# Patient Record
Sex: Female | Born: 1935 | Race: White | Hispanic: No | Smoking: Former smoker
Health system: Southern US, Community
[De-identification: ages and names within clinical notes are randomized; demographics above are authoritative.]

## PROBLEM LIST (undated history)

## (undated) DIAGNOSIS — M545 Low back pain, unspecified: Secondary | ICD-10-CM

## (undated) DIAGNOSIS — G8929 Other chronic pain: Secondary | ICD-10-CM

## (undated) DIAGNOSIS — F0390 Unspecified dementia without behavioral disturbance: Secondary | ICD-10-CM

## (undated) DIAGNOSIS — F419 Anxiety disorder, unspecified: Secondary | ICD-10-CM

## (undated) DIAGNOSIS — J189 Pneumonia, unspecified organism: Secondary | ICD-10-CM

## (undated) DIAGNOSIS — A498 Other bacterial infections of unspecified site: Secondary | ICD-10-CM

## (undated) DIAGNOSIS — E7800 Pure hypercholesterolemia, unspecified: Secondary | ICD-10-CM

## (undated) DIAGNOSIS — I10 Essential (primary) hypertension: Secondary | ICD-10-CM

## (undated) DIAGNOSIS — I2510 Atherosclerotic heart disease of native coronary artery without angina pectoris: Secondary | ICD-10-CM

## (undated) DIAGNOSIS — K5730 Diverticulosis of large intestine without perforation or abscess without bleeding: Secondary | ICD-10-CM

## (undated) DIAGNOSIS — E119 Type 2 diabetes mellitus without complications: Secondary | ICD-10-CM

## (undated) DIAGNOSIS — C50919 Malignant neoplasm of unspecified site of unspecified female breast: Secondary | ICD-10-CM

## (undated) DIAGNOSIS — B351 Tinea unguium: Secondary | ICD-10-CM

## (undated) DIAGNOSIS — M1990 Unspecified osteoarthritis, unspecified site: Secondary | ICD-10-CM

## (undated) DIAGNOSIS — K635 Polyp of colon: Secondary | ICD-10-CM

## (undated) DIAGNOSIS — I872 Venous insufficiency (chronic) (peripheral): Secondary | ICD-10-CM

## (undated) HISTORY — DX: Anxiety disorder, unspecified: F41.9

## (undated) HISTORY — PX: APPENDECTOMY: SHX54

## (undated) HISTORY — DX: Polyp of colon: K63.5

## (undated) HISTORY — DX: Other bacterial infections of unspecified site: A49.8

## (undated) HISTORY — DX: Essential (primary) hypertension: I10

## (undated) HISTORY — PX: CYST EXCISION: SHX5701

## (undated) HISTORY — DX: Malignant neoplasm of unspecified site of unspecified female breast: C50.919

## (undated) HISTORY — DX: Pure hypercholesterolemia, unspecified: E78.00

## (undated) HISTORY — PX: TONSILLECTOMY: SUR1361

## (undated) HISTORY — DX: Venous insufficiency (chronic) (peripheral): I87.2

## (undated) HISTORY — DX: Atherosclerotic heart disease of native coronary artery without angina pectoris: I25.10

## (undated) HISTORY — DX: Unspecified osteoarthritis, unspecified site: M19.90

## (undated) HISTORY — DX: Tinea unguium: B35.1

## (undated) HISTORY — PX: CATARACT EXTRACTION W/ INTRAOCULAR LENS  IMPLANT, BILATERAL: SHX1307

## (undated) HISTORY — DX: Diverticulosis of large intestine without perforation or abscess without bleeding: K57.30

## (undated) HISTORY — DX: Unspecified dementia, unspecified severity, without behavioral disturbance, psychotic disturbance, mood disturbance, and anxiety: F03.90

---

## 2002-03-11 ENCOUNTER — Ambulatory Visit (HOSPITAL_COMMUNITY): Admission: RE | Admit: 2002-03-11 | Discharge: 2002-03-11 | Payer: Self-pay | Admitting: Pulmonary Disease

## 2002-03-11 ENCOUNTER — Encounter: Payer: Self-pay | Admitting: Pulmonary Disease

## 2003-06-26 DIAGNOSIS — K635 Polyp of colon: Secondary | ICD-10-CM

## 2003-06-26 HISTORY — DX: Polyp of colon: K63.5

## 2003-08-08 DIAGNOSIS — C50919 Malignant neoplasm of unspecified site of unspecified female breast: Secondary | ICD-10-CM

## 2003-08-08 HISTORY — PX: BREAST BIOPSY: SHX20

## 2003-08-08 HISTORY — DX: Malignant neoplasm of unspecified site of unspecified female breast: C50.919

## 2004-05-26 ENCOUNTER — Other Ambulatory Visit: Admission: RE | Admit: 2004-05-26 | Discharge: 2004-05-26 | Payer: Self-pay | Admitting: Radiology

## 2004-05-26 ENCOUNTER — Encounter (INDEPENDENT_AMBULATORY_CARE_PROVIDER_SITE_OTHER): Payer: Self-pay | Admitting: Radiology

## 2004-06-01 ENCOUNTER — Encounter (HOSPITAL_COMMUNITY): Admission: RE | Admit: 2004-06-01 | Discharge: 2004-08-30 | Payer: Self-pay | Admitting: General Surgery

## 2004-06-07 HISTORY — PX: MASTECTOMY, PARTIAL: SHX709

## 2004-06-09 ENCOUNTER — Encounter: Admission: RE | Admit: 2004-06-09 | Discharge: 2004-06-09 | Payer: Self-pay | Admitting: General Surgery

## 2004-06-10 ENCOUNTER — Encounter (INDEPENDENT_AMBULATORY_CARE_PROVIDER_SITE_OTHER): Payer: Self-pay | Admitting: *Deleted

## 2004-06-10 ENCOUNTER — Ambulatory Visit (HOSPITAL_COMMUNITY): Admission: RE | Admit: 2004-06-10 | Discharge: 2004-06-10 | Payer: Self-pay | Admitting: General Surgery

## 2004-06-10 ENCOUNTER — Ambulatory Visit (HOSPITAL_BASED_OUTPATIENT_CLINIC_OR_DEPARTMENT_OTHER): Admission: RE | Admit: 2004-06-10 | Discharge: 2004-06-10 | Payer: Self-pay | Admitting: General Surgery

## 2004-06-10 ENCOUNTER — Encounter: Admission: RE | Admit: 2004-06-10 | Discharge: 2004-06-10 | Payer: Self-pay | Admitting: General Surgery

## 2004-06-10 ENCOUNTER — Encounter (INDEPENDENT_AMBULATORY_CARE_PROVIDER_SITE_OTHER): Payer: Self-pay | Admitting: General Surgery

## 2004-06-23 ENCOUNTER — Ambulatory Visit: Payer: Self-pay | Admitting: Oncology

## 2004-07-06 ENCOUNTER — Ambulatory Visit: Admission: RE | Admit: 2004-07-06 | Discharge: 2004-08-12 | Payer: Self-pay | Admitting: Radiation Oncology

## 2004-07-13 ENCOUNTER — Ambulatory Visit: Payer: Self-pay | Admitting: Pulmonary Disease

## 2004-08-09 ENCOUNTER — Ambulatory Visit: Payer: Self-pay | Admitting: Oncology

## 2004-09-26 ENCOUNTER — Ambulatory Visit: Payer: Self-pay | Admitting: Pulmonary Disease

## 2004-09-27 ENCOUNTER — Ambulatory Visit: Payer: Self-pay | Admitting: Pulmonary Disease

## 2004-11-07 ENCOUNTER — Ambulatory Visit: Payer: Self-pay | Admitting: Oncology

## 2005-06-12 ENCOUNTER — Ambulatory Visit: Payer: Self-pay | Admitting: Oncology

## 2005-11-08 ENCOUNTER — Ambulatory Visit: Payer: Self-pay | Admitting: Pulmonary Disease

## 2005-11-09 ENCOUNTER — Ambulatory Visit: Payer: Self-pay | Admitting: Pulmonary Disease

## 2005-12-07 ENCOUNTER — Ambulatory Visit: Payer: Self-pay | Admitting: Oncology

## 2005-12-11 LAB — COMPREHENSIVE METABOLIC PANEL
AST: 16 U/L (ref 0–37)
Alkaline Phosphatase: 46 U/L (ref 39–117)
BUN: 44 mg/dL — ABNORMAL HIGH (ref 6–23)
Creatinine, Ser: 1.2 mg/dL (ref 0.4–1.2)
Total Bilirubin: 0.7 mg/dL (ref 0.3–1.2)

## 2005-12-11 LAB — CBC WITH DIFFERENTIAL/PLATELET
Basophils Absolute: 0 10*3/uL (ref 0.0–0.1)
EOS%: 3.4 % (ref 0.0–7.0)
HCT: 42.5 % (ref 34.8–46.6)
HGB: 14.5 g/dL (ref 11.6–15.9)
MCH: 29.8 pg (ref 26.0–34.0)
MCV: 87.6 fL (ref 81.0–101.0)
MONO%: 10 % (ref 0.0–13.0)
NEUT%: 66.3 % (ref 39.6–76.8)
RDW: 14.8 % — ABNORMAL HIGH (ref 11.3–14.5)

## 2006-06-27 ENCOUNTER — Encounter: Admission: RE | Admit: 2006-06-27 | Discharge: 2006-06-27 | Payer: Self-pay | Admitting: General Surgery

## 2006-07-12 ENCOUNTER — Ambulatory Visit: Payer: Self-pay | Admitting: Oncology

## 2006-07-16 LAB — CBC WITH DIFFERENTIAL/PLATELET
BASO%: 0.3 % (ref 0.0–2.0)
Basophils Absolute: 0 10*3/uL (ref 0.0–0.1)
EOS%: 3.1 % (ref 0.0–7.0)
MCH: 29.6 pg (ref 26.0–34.0)
MCHC: 33.4 g/dL (ref 32.0–36.0)
MCV: 88.6 fL (ref 81.0–101.0)
MONO%: 6.6 % (ref 0.0–13.0)
RBC: 4.7 10*6/uL (ref 3.70–5.32)
RDW: 14.5 % (ref 11.3–14.5)

## 2006-07-16 LAB — COMPREHENSIVE METABOLIC PANEL
AST: 17 U/L (ref 0–37)
Albumin: 4.4 g/dL (ref 3.5–5.2)
Alkaline Phosphatase: 53 U/L (ref 39–117)
BUN: 28 mg/dL — ABNORMAL HIGH (ref 6–23)
Potassium: 4.4 mEq/L (ref 3.5–5.3)

## 2006-12-19 ENCOUNTER — Ambulatory Visit: Payer: Self-pay | Admitting: Pulmonary Disease

## 2006-12-20 ENCOUNTER — Ambulatory Visit: Payer: Self-pay | Admitting: Pulmonary Disease

## 2006-12-20 LAB — CONVERTED CEMR LAB
ALT: 13 units/L (ref 0–40)
AST: 21 units/L (ref 0–37)
Albumin: 4 g/dL (ref 3.5–5.2)
Alkaline Phosphatase: 50 units/L (ref 39–117)
BUN: 41 mg/dL — ABNORMAL HIGH (ref 6–23)
Basophils Absolute: 0 10*3/uL (ref 0.0–0.1)
Basophils Relative: 0.5 % (ref 0.0–1.0)
Bilirubin, Direct: 0.3 mg/dL (ref 0.0–0.3)
CO2: 28 meq/L (ref 19–32)
Calcium: 9.7 mg/dL (ref 8.4–10.5)
Chloride: 102 meq/L (ref 96–112)
Cholesterol: 162 mg/dL (ref 0–200)
Creatinine, Ser: 1.1 mg/dL (ref 0.4–1.2)
Eosinophils Absolute: 0.4 10*3/uL (ref 0.0–0.6)
Eosinophils Relative: 4.9 % (ref 0.0–5.0)
GFR calc Af Amer: 60 mL/min
GFR calc non Af Amer: 50 mL/min
Glucose, Bld: 166 mg/dL — ABNORMAL HIGH (ref 70–99)
HCT: 43.4 % (ref 36.0–46.0)
HDL: 62.5 mg/dL (ref >39.0)
Hemoglobin: 14.9 g/dL (ref 12.0–15.0)
Hgb A1c MFr Bld: 6.5 % — ABNORMAL HIGH (ref 4.6–6.0)
LDL Cholesterol: 83 mg/dL (ref 0–99)
Lymphocytes Relative: 24.3 % (ref 12.0–46.0)
MCHC: 34.4 g/dL (ref 30.0–36.0)
MCV: 86.1 fL (ref 78.0–100.0)
Monocytes Absolute: 0.6 10*3/uL (ref 0.2–0.7)
Monocytes Relative: 8.1 % (ref 3.0–11.0)
Neutro Abs: 4.6 10*3/uL (ref 1.4–7.7)
Neutrophils Relative %: 62.2 % (ref 43.0–77.0)
Platelets: 192 10*3/uL (ref 150–400)
Potassium: 4.8 meq/L (ref 3.5–5.1)
RBC: 5.04 M/uL (ref 3.87–5.11)
RDW: 13.7 % (ref 11.5–14.6)
Sodium: 138 meq/L (ref 135–145)
TSH: 1.39 microintl units/mL (ref 0.35–5.50)
Total Bilirubin: 1.1 mg/dL (ref 0.3–1.2)
Total CHOL/HDL Ratio: 2.6
Total Protein: 6.9 g/dL (ref 6.0–8.3)
Triglycerides: 82 mg/dL (ref 0–149)
VLDL: 16 mg/dL (ref 0–40)
WBC: 7.4 10*3/uL (ref 4.5–10.5)

## 2007-02-15 ENCOUNTER — Ambulatory Visit: Payer: Self-pay | Admitting: Oncology

## 2007-02-19 LAB — CBC WITH DIFFERENTIAL/PLATELET
Basophils Absolute: 0.1 10*3/uL (ref 0.0–0.1)
EOS%: 3.9 % (ref 0.0–7.0)
HCT: 39.4 % (ref 34.8–46.6)
HGB: 13.6 g/dL (ref 11.6–15.9)
MCH: 30 pg (ref 26.0–34.0)
MCV: 86.8 fL (ref 81.0–101.0)
MONO%: 8.9 % (ref 0.0–13.0)
NEUT%: 67.8 % (ref 39.6–76.8)

## 2007-02-19 LAB — COMPREHENSIVE METABOLIC PANEL
AST: 16 U/L (ref 0–37)
BUN: 43 mg/dL — ABNORMAL HIGH (ref 6–23)
Calcium: 9.9 mg/dL (ref 8.4–10.5)
Chloride: 104 mEq/L (ref 96–112)
Creatinine, Ser: 1.34 mg/dL — ABNORMAL HIGH (ref 0.40–1.20)
Glucose, Bld: 122 mg/dL — ABNORMAL HIGH (ref 70–99)

## 2007-05-23 ENCOUNTER — Encounter: Payer: Self-pay | Admitting: Pulmonary Disease

## 2007-05-28 DIAGNOSIS — I10 Essential (primary) hypertension: Secondary | ICD-10-CM | POA: Insufficient documentation

## 2007-05-28 DIAGNOSIS — E119 Type 2 diabetes mellitus without complications: Secondary | ICD-10-CM

## 2007-05-28 DIAGNOSIS — M810 Age-related osteoporosis without current pathological fracture: Secondary | ICD-10-CM | POA: Insufficient documentation

## 2007-05-28 DIAGNOSIS — M159 Polyosteoarthritis, unspecified: Secondary | ICD-10-CM | POA: Insufficient documentation

## 2007-05-28 DIAGNOSIS — F411 Generalized anxiety disorder: Secondary | ICD-10-CM

## 2007-05-28 DIAGNOSIS — E7800 Pure hypercholesterolemia, unspecified: Secondary | ICD-10-CM

## 2007-08-21 ENCOUNTER — Ambulatory Visit: Payer: Self-pay | Admitting: Oncology

## 2007-09-16 ENCOUNTER — Telehealth: Payer: Self-pay | Admitting: Pulmonary Disease

## 2007-10-14 ENCOUNTER — Ambulatory Visit: Payer: Self-pay | Admitting: Oncology

## 2007-10-22 ENCOUNTER — Ambulatory Visit: Payer: Self-pay | Admitting: Pulmonary Disease

## 2007-10-22 DIAGNOSIS — C50919 Malignant neoplasm of unspecified site of unspecified female breast: Secondary | ICD-10-CM

## 2007-10-22 DIAGNOSIS — I2510 Atherosclerotic heart disease of native coronary artery without angina pectoris: Secondary | ICD-10-CM | POA: Insufficient documentation

## 2007-10-22 DIAGNOSIS — K5730 Diverticulosis of large intestine without perforation or abscess without bleeding: Secondary | ICD-10-CM | POA: Insufficient documentation

## 2007-10-23 ENCOUNTER — Ambulatory Visit: Payer: Self-pay | Admitting: Pulmonary Disease

## 2007-10-26 LAB — CONVERTED CEMR LAB
ALT: 19 units/L (ref 0–35)
AST: 19 units/L (ref 0–37)
Albumin: 3.9 g/dL (ref 3.5–5.2)
Alkaline Phosphatase: 48 units/L (ref 39–117)
BUN: 27 mg/dL — ABNORMAL HIGH (ref 6–23)
Basophils Absolute: 0.1 10*3/uL (ref 0.0–0.1)
Basophils Relative: 0.7 % (ref 0.0–1.0)
Bilirubin, Direct: 0.2 mg/dL (ref 0.0–0.3)
CO2: 31 meq/L (ref 19–32)
Calcium: 10 mg/dL (ref 8.4–10.5)
Chloride: 102 meq/L (ref 96–112)
Cholesterol: 179 mg/dL (ref 0–200)
Creatinine, Ser: 1.2 mg/dL (ref 0.4–1.2)
Eosinophils Absolute: 0.2 10*3/uL (ref 0.0–0.6)
Eosinophils Relative: 2 % (ref 0.0–5.0)
GFR calc Af Amer: 55 mL/min
GFR calc non Af Amer: 45 mL/min
Glucose, Bld: 161 mg/dL — ABNORMAL HIGH (ref 70–99)
HCT: 42.2 % (ref 36.0–46.0)
HDL: 72.7 mg/dL (ref >39.0)
Hemoglobin: 14 g/dL (ref 12.0–15.0)
LDL Cholesterol: 93 mg/dL (ref 0–99)
Lymphocytes Relative: 16.3 % (ref 12.0–46.0)
MCHC: 33.3 g/dL (ref 30.0–36.0)
MCV: 89.2 fL (ref 78.0–100.0)
Monocytes Absolute: 0.7 10*3/uL (ref 0.2–0.7)
Monocytes Relative: 8.9 % (ref 3.0–11.0)
Neutro Abs: 5.9 10*3/uL (ref 1.4–7.7)
Neutrophils Relative %: 72.1 % (ref 43.0–77.0)
Platelets: 195 10*3/uL (ref 150–400)
Potassium: 5.4 meq/L — ABNORMAL HIGH (ref 3.5–5.1)
RBC: 4.73 M/uL (ref 3.87–5.11)
RDW: 13.8 % (ref 11.5–14.6)
Sodium: 141 meq/L (ref 135–145)
TSH: 1.54 microintl units/mL (ref 0.35–5.50)
Total Bilirubin: 1 mg/dL (ref 0.3–1.2)
Total CHOL/HDL Ratio: 2.5
Total Protein: 6.3 g/dL (ref 6.0–8.3)
Triglycerides: 67 mg/dL (ref 0–149)
VLDL: 13 mg/dL (ref 0–40)
WBC: 8.3 10*3/uL (ref 4.5–10.5)

## 2007-11-26 ENCOUNTER — Encounter: Payer: Self-pay | Admitting: Pulmonary Disease

## 2007-12-12 ENCOUNTER — Telehealth (INDEPENDENT_AMBULATORY_CARE_PROVIDER_SITE_OTHER): Payer: Self-pay | Admitting: *Deleted

## 2007-12-23 ENCOUNTER — Encounter: Payer: Self-pay | Admitting: Pulmonary Disease

## 2008-01-16 ENCOUNTER — Encounter: Payer: Self-pay | Admitting: Pulmonary Disease

## 2008-01-17 ENCOUNTER — Encounter: Payer: Self-pay | Admitting: Pulmonary Disease

## 2008-01-17 ENCOUNTER — Telehealth: Payer: Self-pay | Admitting: Pulmonary Disease

## 2008-02-13 ENCOUNTER — Encounter: Payer: Self-pay | Admitting: Pulmonary Disease

## 2008-04-09 ENCOUNTER — Encounter: Payer: Self-pay | Admitting: Pulmonary Disease

## 2008-04-20 ENCOUNTER — Ambulatory Visit: Payer: Self-pay | Admitting: Pulmonary Disease

## 2008-04-20 DIAGNOSIS — D126 Benign neoplasm of colon, unspecified: Secondary | ICD-10-CM

## 2008-04-22 LAB — CONVERTED CEMR LAB
BUN: 32 mg/dL — ABNORMAL HIGH (ref 6–23)
CO2: 33 meq/L — ABNORMAL HIGH (ref 19–32)
Calcium: 10.1 mg/dL (ref 8.4–10.5)
Chloride: 104 meq/L (ref 96–112)
Creatinine, Ser: 1.3 mg/dL — ABNORMAL HIGH (ref 0.4–1.2)
GFR calc Af Amer: 50 mL/min
GFR calc non Af Amer: 41 mL/min
Glucose, Bld: 111 mg/dL — ABNORMAL HIGH (ref 70–99)
Hgb A1c MFr Bld: 6.1 % — ABNORMAL HIGH (ref 4.6–6.0)
Potassium: 5.1 meq/L (ref 3.5–5.1)
Sodium: 142 meq/L (ref 135–145)
Vit D, 1,25-Dihydroxy: 32 (ref 30–89)

## 2008-04-27 ENCOUNTER — Encounter: Payer: Self-pay | Admitting: Pulmonary Disease

## 2008-05-19 ENCOUNTER — Telehealth (INDEPENDENT_AMBULATORY_CARE_PROVIDER_SITE_OTHER): Payer: Self-pay | Admitting: *Deleted

## 2008-05-26 ENCOUNTER — Encounter: Payer: Self-pay | Admitting: Pulmonary Disease

## 2008-05-29 ENCOUNTER — Ambulatory Visit: Payer: Self-pay | Admitting: Oncology

## 2008-06-02 ENCOUNTER — Encounter: Payer: Self-pay | Admitting: Pulmonary Disease

## 2008-06-02 LAB — CBC WITH DIFFERENTIAL/PLATELET
EOS%: 4.4 % (ref 0.0–7.0)
MCH: 29.9 pg (ref 26.0–34.0)
MCV: 88.2 fL (ref 81.0–101.0)
MONO%: 9 % (ref 0.0–13.0)
NEUT#: 5.6 10*3/uL (ref 1.5–6.5)
RBC: 4.68 10*6/uL (ref 3.70–5.32)
RDW: 14.3 % (ref 11.3–14.5)
lymph#: 1.9 10*3/uL (ref 0.9–3.3)

## 2008-06-02 LAB — COMPREHENSIVE METABOLIC PANEL
ALT: 12 U/L (ref 0–35)
AST: 15 U/L (ref 0–37)
Albumin: 4.3 g/dL (ref 3.5–5.2)
Alkaline Phosphatase: 40 U/L (ref 39–117)
Chloride: 103 mEq/L (ref 96–112)
Potassium: 4.2 mEq/L (ref 3.5–5.3)
Sodium: 143 mEq/L (ref 135–145)
Total Protein: 6.7 g/dL (ref 6.0–8.3)

## 2008-06-05 ENCOUNTER — Telehealth (INDEPENDENT_AMBULATORY_CARE_PROVIDER_SITE_OTHER): Payer: Self-pay | Admitting: *Deleted

## 2008-07-21 ENCOUNTER — Encounter: Payer: Self-pay | Admitting: Pulmonary Disease

## 2008-10-19 ENCOUNTER — Ambulatory Visit: Payer: Self-pay | Admitting: Pulmonary Disease

## 2008-10-19 DIAGNOSIS — I872 Venous insufficiency (chronic) (peripheral): Secondary | ICD-10-CM | POA: Insufficient documentation

## 2008-10-20 ENCOUNTER — Ambulatory Visit: Payer: Self-pay | Admitting: Pulmonary Disease

## 2008-10-22 LAB — CONVERTED CEMR LAB
ALT: 14 units/L (ref 0–35)
AST: 18 units/L (ref 0–37)
Albumin: 3.9 g/dL (ref 3.5–5.2)
Alkaline Phosphatase: 38 units/L — ABNORMAL LOW (ref 39–117)
BUN: 22 mg/dL (ref 6–23)
Basophils Absolute: 0.4 10*3/uL — ABNORMAL HIGH (ref 0.0–0.1)
Basophils Relative: 5.7 % — ABNORMAL HIGH (ref 0.0–3.0)
Bilirubin, Direct: 0.2 mg/dL (ref 0.0–0.3)
CO2: 30 meq/L (ref 19–32)
Calcium: 9.8 mg/dL (ref 8.4–10.5)
Chloride: 108 meq/L (ref 96–112)
Cholesterol: 163 mg/dL (ref 0–200)
Creatinine, Ser: 1.1 mg/dL (ref 0.4–1.2)
Eosinophils Absolute: 0.4 10*3/uL (ref 0.0–0.7)
Eosinophils Relative: 5.4 % — ABNORMAL HIGH (ref 0.0–5.0)
GFR calc non Af Amer: 49.72 mL/min (ref >60)
Glucose, Bld: 149 mg/dL — ABNORMAL HIGH (ref 70–99)
HCT: 41.8 % (ref 36.0–46.0)
HDL: 72.1 mg/dL (ref >39.00)
Hemoglobin: 14.1 g/dL (ref 12.0–15.0)
Hgb A1c MFr Bld: 6.2 % (ref 4.6–6.5)
LDL Cholesterol: 77 mg/dL (ref 0–99)
Lymphocytes Relative: 21.8 % (ref 12.0–46.0)
Lymphs Abs: 1.7 10*3/uL (ref 0.7–4.0)
MCHC: 33.7 g/dL (ref 30.0–36.0)
MCV: 89.3 fL (ref 78.0–100.0)
Monocytes Absolute: 0.6 10*3/uL (ref 0.1–1.0)
Monocytes Relative: 8.1 % (ref 3.0–12.0)
Neutro Abs: 4.6 10*3/uL (ref 1.4–7.7)
Neutrophils Relative %: 59 % (ref 43.0–77.0)
Platelets: 156 10*3/uL (ref 150.0–400.0)
Potassium: 5.4 meq/L — ABNORMAL HIGH (ref 3.5–5.1)
RBC: 4.68 M/uL (ref 3.87–5.11)
RDW: 13.6 % (ref 11.5–14.6)
Sodium: 144 meq/L (ref 135–145)
TSH: 1.83 microintl units/mL (ref 0.35–5.50)
Total Bilirubin: 1.3 mg/dL — ABNORMAL HIGH (ref 0.3–1.2)
Total CHOL/HDL Ratio: 2
Total Protein: 6.7 g/dL (ref 6.0–8.3)
Triglycerides: 71 mg/dL (ref 0.0–149.0)
VLDL: 14.2 mg/dL (ref 0.0–40.0)
WBC: 7.7 10*3/uL (ref 4.5–10.5)

## 2008-11-27 ENCOUNTER — Ambulatory Visit: Payer: Self-pay | Admitting: Oncology

## 2008-12-01 ENCOUNTER — Encounter: Payer: Self-pay | Admitting: Pulmonary Disease

## 2008-12-01 LAB — CBC WITH DIFFERENTIAL/PLATELET
Basophils Absolute: 0 10*3/uL (ref 0.0–0.1)
Eosinophils Absolute: 0.4 10*3/uL (ref 0.0–0.5)
HGB: 13.8 g/dL (ref 11.6–15.9)
MCV: 87.9 fL (ref 79.5–101.0)
MONO%: 9.6 % (ref 0.0–14.0)
NEUT#: 5.7 10*3/uL (ref 1.5–6.5)
RBC: 4.62 10*6/uL (ref 3.70–5.45)
RDW: 14.4 % (ref 11.2–14.5)
WBC: 8.7 10*3/uL (ref 3.9–10.3)
lymph#: 1.8 10*3/uL (ref 0.9–3.3)

## 2008-12-01 LAB — COMPREHENSIVE METABOLIC PANEL
AST: 14 U/L (ref 0–37)
Albumin: 4.2 g/dL (ref 3.5–5.2)
Alkaline Phosphatase: 43 U/L (ref 39–117)
Calcium: 10.3 mg/dL (ref 8.4–10.5)
Chloride: 101 mEq/L (ref 96–112)
Glucose, Bld: 112 mg/dL — ABNORMAL HIGH (ref 70–99)
Potassium: 4.8 mEq/L (ref 3.5–5.3)
Sodium: 140 mEq/L (ref 135–145)
Total Protein: 6.5 g/dL (ref 6.0–8.3)

## 2008-12-17 ENCOUNTER — Telehealth (INDEPENDENT_AMBULATORY_CARE_PROVIDER_SITE_OTHER): Payer: Self-pay | Admitting: *Deleted

## 2008-12-24 ENCOUNTER — Encounter: Payer: Self-pay | Admitting: Pulmonary Disease

## 2009-03-16 ENCOUNTER — Encounter: Payer: Self-pay | Admitting: Pulmonary Disease

## 2009-04-14 ENCOUNTER — Telehealth: Payer: Self-pay | Admitting: Pulmonary Disease

## 2009-04-15 ENCOUNTER — Encounter: Payer: Self-pay | Admitting: Pulmonary Disease

## 2009-04-19 ENCOUNTER — Ambulatory Visit: Payer: Self-pay | Admitting: Pulmonary Disease

## 2009-05-02 LAB — CONVERTED CEMR LAB
BUN: 29 mg/dL — ABNORMAL HIGH (ref 6–23)
CO2: 30 meq/L (ref 19–32)
Calcium: 9.8 mg/dL (ref 8.4–10.5)
Chloride: 106 meq/L (ref 96–112)
Creatinine, Ser: 1.2 mg/dL (ref 0.4–1.2)
GFR calc non Af Amer: 44.92 mL/min (ref >60)
Glucose, Bld: 112 mg/dL — ABNORMAL HIGH (ref 70–99)
Hgb A1c MFr Bld: 5.8 % (ref 4.6–6.5)
Potassium: 4.8 meq/L (ref 3.5–5.1)
Sodium: 142 meq/L (ref 135–145)

## 2009-05-18 ENCOUNTER — Telehealth (INDEPENDENT_AMBULATORY_CARE_PROVIDER_SITE_OTHER): Payer: Self-pay | Admitting: *Deleted

## 2009-05-24 ENCOUNTER — Encounter: Payer: Self-pay | Admitting: Pulmonary Disease

## 2009-05-28 ENCOUNTER — Encounter: Payer: Self-pay | Admitting: Pulmonary Disease

## 2009-09-09 ENCOUNTER — Telehealth (INDEPENDENT_AMBULATORY_CARE_PROVIDER_SITE_OTHER): Payer: Self-pay | Admitting: *Deleted

## 2009-09-09 ENCOUNTER — Ambulatory Visit: Payer: Self-pay | Admitting: Adult Health

## 2009-09-10 LAB — CONVERTED CEMR LAB
BUN: 23 mg/dL (ref 6–23)
CO2: 30 meq/L (ref 19–32)
Calcium: 10.9 mg/dL — ABNORMAL HIGH (ref 8.4–10.5)
Chloride: 103 meq/L (ref 96–112)
Cholesterol: 182 mg/dL (ref 0–200)
Creatinine, Ser: 1.16 mg/dL (ref 0.40–1.20)
Glucose, Bld: 115 mg/dL — ABNORMAL HIGH (ref 70–99)
HDL: 88 mg/dL (ref >39)
Hgb A1c MFr Bld: 6.4 % — ABNORMAL HIGH (ref 4.6–6.1)
LDL Cholesterol: 76 mg/dL (ref 0–99)
Potassium: 4.5 meq/L (ref 3.5–5.3)
Pro B Natriuretic peptide (BNP): 156.6 pg/mL — ABNORMAL HIGH (ref 0.0–100.0)
Sodium: 144 meq/L (ref 135–145)
Total CHOL/HDL Ratio: 2.1
Triglycerides: 91 mg/dL (ref <150)
VLDL: 18 mg/dL (ref 0–40)

## 2009-10-18 ENCOUNTER — Ambulatory Visit: Payer: Self-pay | Admitting: Pulmonary Disease

## 2010-01-10 ENCOUNTER — Ambulatory Visit: Payer: Self-pay | Admitting: Oncology

## 2010-01-12 ENCOUNTER — Encounter: Payer: Self-pay | Admitting: Pulmonary Disease

## 2010-01-12 LAB — CBC WITH DIFFERENTIAL/PLATELET
BASO%: 0.5 % (ref 0.0–2.0)
Basophils Absolute: 0 10*3/uL (ref 0.0–0.1)
EOS%: 7.5 % — ABNORMAL HIGH (ref 0.0–7.0)
Eosinophils Absolute: 0.7 10*3/uL — ABNORMAL HIGH (ref 0.0–0.5)
HCT: 39.2 % (ref 34.8–46.6)
HGB: 13.3 g/dL (ref 11.6–15.9)
LYMPH%: 20.8 % (ref 14.0–49.7)
MCH: 29.6 pg (ref 25.1–34.0)
MCHC: 33.9 g/dL (ref 31.5–36.0)
MCV: 87.3 fL (ref 79.5–101.0)
MONO#: 0.9 10*3/uL (ref 0.1–0.9)
MONO%: 9.4 % (ref 0.0–14.0)
NEUT#: 5.7 10*3/uL (ref 1.5–6.5)
NEUT%: 61.8 % (ref 38.4–76.8)
Platelets: 184 10*3/uL (ref 145–400)
RBC: 4.49 10*6/uL (ref 3.70–5.45)
RDW: 14.4 % (ref 11.2–14.5)
WBC: 9.3 10*3/uL (ref 3.9–10.3)
lymph#: 1.9 10*3/uL (ref 0.9–3.3)

## 2010-01-12 LAB — COMPREHENSIVE METABOLIC PANEL
ALT: 12 U/L (ref 0–35)
AST: 17 U/L (ref 0–37)
Albumin: 4.2 g/dL (ref 3.5–5.2)
Alkaline Phosphatase: 40 U/L (ref 39–117)
BUN: 39 mg/dL — ABNORMAL HIGH (ref 6–23)
CO2: 27 mEq/L (ref 19–32)
Calcium: 9.5 mg/dL (ref 8.4–10.5)
Chloride: 102 mEq/L (ref 96–112)
Creatinine, Ser: 1.47 mg/dL — ABNORMAL HIGH (ref 0.40–1.20)
Glucose, Bld: 108 mg/dL — ABNORMAL HIGH (ref 70–99)
Potassium: 4.5 mEq/L (ref 3.5–5.3)
Sodium: 141 mEq/L (ref 135–145)
Total Bilirubin: 0.6 mg/dL (ref 0.3–1.2)
Total Protein: 6.7 g/dL (ref 6.0–8.3)

## 2010-02-02 ENCOUNTER — Encounter: Payer: Self-pay | Admitting: Pulmonary Disease

## 2010-02-11 ENCOUNTER — Encounter: Payer: Self-pay | Admitting: Pulmonary Disease

## 2010-04-18 ENCOUNTER — Encounter: Payer: Self-pay | Admitting: Pulmonary Disease

## 2010-04-18 ENCOUNTER — Ambulatory Visit: Payer: Self-pay | Admitting: Pulmonary Disease

## 2010-04-27 ENCOUNTER — Encounter: Payer: Self-pay | Admitting: Pulmonary Disease

## 2010-05-17 ENCOUNTER — Telehealth (INDEPENDENT_AMBULATORY_CARE_PROVIDER_SITE_OTHER): Payer: Self-pay | Admitting: *Deleted

## 2010-05-26 ENCOUNTER — Encounter: Payer: Self-pay | Admitting: Pulmonary Disease

## 2010-07-04 ENCOUNTER — Encounter: Payer: Self-pay | Admitting: Pulmonary Disease

## 2010-09-06 NOTE — Progress Notes (Signed)
Summary: swollen leg  Phone Note Call from Patient   Caller: Patient Call For: nadel Complaint: Breathing Problems Summary of Call: pt's leg red and swollen would like ov today Initial call taken by: Rickard Patience,  September 09, 2009 9:14 AM  Follow-up for Phone Call        Pt c/o left leg swelling for 3 days.  c/o redness and warm to touch at times.  LM for pt's son [name] [phone]) to call back to see if he can [place] appt with Tammy at 2:00. Abigail Miyamoto RN  September 09, 2009 9:41 AM   Spoke with pt's son and clarified appt in [place] at 2:00. Abigail Miyamoto RN  September 09, 2009 10:28 AM

## 2010-09-06 NOTE — Medication Information (Signed)
Summary: Carvedilol / [facility]  Carvedilol / [facility]   Imported By: Lennie Odor 07/07/2010 13:53:22  _____________________________________________________________________  External Attachment:    Type:   Image     Comment:   External Document

## 2010-09-06 NOTE — Assessment & Plan Note (Signed)
Summary: 6 month return/mhh   CC:  6 month ROV & review of mult medical problems....  History of Present Illness: 77 y/o WF here for a follow up visit... she has mult med problems as noted below...    ~  April 19, 2009:  DrLivesay stopped her Femara 4/10 due to worsening bone density & wedge deformities (+4.72yrs of Rx)... she is asking about the Shingles vaccine & we discussed this- due to her hx of shingles in the past she is rec to get the vaccine at the health dept... she is under incr stress w/ a family member dx w/ melanoma (granddaughter's husb)...   ~  October 18, 2009:  she was seen by TP 2/11 w/ incr edema & labs were OK- reviewed w/ pt... she has chr ven insuffic & rec to continue Lasix40, elim salt, wear support hose (but she says TED's are too hard for her to put on)...  BP controlled on meds;  hx CAD- followed by DrWeintraub w/o angina;  Chol controlled on Simva20;  BS controlled on Metformin...    Current Problems:   HYPERTENSION (ICD-401.9) - controlled on LABETOLOL 200mg Bid, LISINOPRIL 20mg /d, LASIX 40mg /d... BP=126/84 here today & similar at home... feeling well; denies HA, fatigue, visual changes, CP, palipit, dizziness, syncope, dyspnea, etc...  ~  labs 2/11 showed normal chem's and BNP=157  CORONARY ARTERY DISEASE (ICD-414.00) - on ASA 81mg /d... no CP, palpit, SOB, etc... exerc= yard, walking...  ~  cath 2/91 by DrWeintraub showed 70% single vessel dis RCA & MVP...  VENOUS INSUFFICIENCY (ICD-459.81) - she has mod Ven insuffic w/ chr edema & knows to follow a low sodium diet, elevate legs, wear support hose when able "[name]", & takes the LASIX.  HYPERCHOLESTEROLEMIA (ICD-272.0) - on SIMVASTATIN 20mg /d... tolerating well, +diet...  ~  FLP 5/08 showed TChol 162, TG 82, HDL 63, LDL 83...  ~  FLP 3/09 showed TChol 179, TG 67, HDL 73, LDL 93... rec- same med.  ~  FLP 3/10 showed TChol 163, TG 71, HDL 72, LDL 77  ~  FLP 2/11 showed TChol 182, TG 91, HDL 88, LDL  76  DM (ICD-250.00) - on diet + METFORMIN 500mg /d added 3/09... weight=178# (down 6# in 65mo)...   ~  labs 5/08 showed BS=166, HgA1c=6.5  ~  labs 3/09 showed BS= 161 & Metformin 500mg /d started...  ~  lab 9/09 showed BS= 111, HgA1c= 6.1.Marland KitchenMarland Kitchen rec- continue same.  ~  labs 3/10 showed BS= 149, A1c= 6.2  ~  labs 9/10 showed BS= 112, A1c= 5.8  ~  labs 2/11 showed BS= 115, A1c= 6.4  DIVERTICULOSIS OF COLON (ICD-562.10) - last colonoscopy was 11/04 by DrDBrodie showing divertics & polyp (not retrieved)... she denies abd pain, D/ C/ blood etc...  Hx of ADENOCARCINOMA, BREAST (ICD-174.9) - Dx'd 11/05 w/ part mastectomy for intracystic papillary carcinoma left breast... surg by Lurline Hare, followed by Duwayne Heck and [facility] stopped 4/10 after 4.42yrs Rx...  ~  saw Duwayne Heck 10/09- doing well, labs OK... f/u Mammogram was neg,  f/u BMD on Femara showed osteopenia w/ TScores -1.4 to -2.2 (sl better in Spine, sl worse in hip)- continue present meds.  ~  saw DrIngram 12/09- doing well...  ~  saw DrLivesay 4/10 & Femara stopped...  ~  f/u Mammogram 10/10 at [facility] neg...   DEGENERATIVE JOINT DISEASE, GENERALIZED (ICD-715.00) OSTEOPOROSIS (ICD-733.00) - she has osteopenia on scans from DrBertrand- on FOSAMAX 70mg /wk, +caltrate, +Vits w/ VitD.  ~  BMD 5/07 at [facility]  w/ TScores -1.5 to -2.3.Marland KitchenMarland KitchenMarland Kitchen this was improved from 12/05 values...  ~  labs 9/09 showed Vit D leve3l = 32... rec- OTC Vit D supplement.  ~  BMD 10/09 at [facility] showed TScores -1.4 to -2.2 (sl better in Spine, sl worse in hip)- continue present meds.  ANXIETY DISORDER, GENERALIZED (ICD-300.02) - on ALPRAZOLAM 0.5mg  Prn...  Health Maintenance - she gets the yearly FLU vaccine;  has PNEUMOVAX 9/09;  ?last Tetanus vaccine...   Allergies: 1)  ! Codeine 2)  ! Macrodantin  Comments:  Nurse/Medical Assistant: The patient's medications and allergies were reviewed with the patient and were updated in the Medication and Allergy Lists.  Past  History:  Past Medical History:  HYPERTENSION (ICD-401.9) CORONARY ARTERY DISEASE (ICD-414.00) VENOUS INSUFFICIENCY (ICD-459.81) HYPERCHOLESTEROLEMIA (ICD-272.0) DM (ICD-250.00) DIVERTICULOSIS OF COLON (ICD-562.10) COLONIC POLYPS (ICD-211.3) Hx of ADENOCARCINOMA, BREAST (ICD-174.9) DEGENERATIVE JOINT DISEASE, GENERALIZED (ICD-715.00) OSTEOPOROSIS (ICD-733.00) ANXIETY DISORDER, GENERALIZED (ICD-300.02)  Past Surgical History: S/P cataract surgery S/P mastectomy for breast cancer  Family History: Reviewed history from 10/19/2008 and no changes required. Father died age 31 from heart disease Mother died age 66 from heart condition 4 Siblings- all Sisters- 2 died w/ Alz dis & heart attack 1 Sister is [name]  Social History: Reviewed history from 10/19/2008 and no changes required. Widow- husb died >16yrs ago 2 Sons- one died age 31 w/ kidney cancer 1 Son alive, [name]- he is the next of kin quit smoking in 1989 caffeine use:  2-3 cups per day no alcohol Retired from [employer] after 30+yrs..  Review of Systems      See HPI       The patient complains of decreased hearing, dyspnea on exertion, peripheral edema, and difficulty walking.  The patient denies anorexia, fever, weight loss, weight gain, vision loss, hoarseness, chest pain, syncope, prolonged cough, headaches, hemoptysis, abdominal pain, melena, hematochezia, severe indigestion/heartburn, hematuria, incontinence, muscle weakness, suspicious skin lesions, transient blindness, depression, unusual weight change, abnormal bleeding, enlarged lymph nodes, and angioedema.    Vital Signs:  Patient profile:   77 year old female Height:      61 inches Weight:      178.13 pounds O2 Sat:      98 % on Room air Temp:     96.8 degrees F oral Pulse rate:   70 / minute BP sitting:   126 / 84  (left arm) Cuff size:   regular  Vitals Entered By: Randell Loop CMA (October 18, 2009 11:13 AM)  O2 Sat at Rest %:  98 O2  Flow:  Room air CC: 6 month ROV & review of mult medical problems... Is Patient Diabetic? Yes Pain Assessment Patient in pain? no      Comments no changes in meds today   Physical Exam  Additional Exam:  WD, WN, 77 y/o WF in NAD... GENERAL:  Alert & oriented; pleasant & cooperative... HEENT:  [place]/AT, EOM-wnl, PERRLA, EACs-clear, TMs-wnl, NOSE-clear, THROAT-clear & wnl. NECK:  Supple w/ fairROM; no JVD; normal carotid impulses w/o bruits; no thyromegaly or nodules palpated; no lymphadenopathy. CHEST:  Clear to P & A; without wheezes/ rales/ or rhonchi heard... HEART:  Regular Rhythm; without murmurs/ rubs/ or gallops detected... ABDOMEN:  Soft & nontender; mild panniculus; normal bowel sounds; no organomegaly or masses palpated... EXT: without deformities or arthritic changes; no varicose veins/ +venous insuffic/ 1+ edema. NEURO:  CN's intact;  no focal neuro deficits... DERM:  No lesions noted; no rash etc...    MISC. Report  Procedure date:  09/09/2009  Findings:      FASTING labs from 09/09/09 are reviewed w/ the pt today...  SN   Impression & Recommendations:  Problem # 1:  HYPERTENSION (ICD-401.9) BP controlled-  continue same meds. Her updated medication list for this problem includes:    Labetalol Hcl 200 Mg Tabs (Labetalol hcl) .Marland Kitchen... Take 1 tablet two times a day    Lisinopril 20 Mg Tabs (Lisinopril) .Marland Kitchen... 1 tab by mouth once daily...    Lasix 40 Mg Tabs (Furosemide) .Marland Kitchen... Take 1 tablet by mouth once a day  Problem # 2:  CORONARY ARTERY DISEASE (ICD-414.00) On ASA- no angina, etc... Her updated medication list for this problem includes:    Labetalol Hcl 200 Mg Tabs (Labetalol hcl) .Marland Kitchen... Take 1 tablet two times a day    Lisinopril 20 Mg Tabs (Lisinopril) .Marland Kitchen... 1 tab by mouth once daily...    Lasix 40 Mg Tabs (Furosemide) .Marland Kitchen... Take 1 tablet by mouth once a day  Problem # 3:  HYPERCHOLESTEROLEMIA (ICD-272.0) Stable on Simva20 + diet... Her updated medication list  for this problem includes:    Zocor 20 Mg Tabs (Simvastatin) .Marland Kitchen... Take one at bedtime  Problem # 4:  DM (ICD-250.00) Controlled on /Metformin... Her updated medication list for this problem includes:    Lisinopril 20 Mg Tabs (Lisinopril) .Marland Kitchen... 1 tab by mouth once daily...    Metformin Hcl 500 Mg Tabs (Metformin hcl) .Marland Kitchen... Take one tablet by mouth every morning  Problem # 5:  Hx of ADENOCARCINOMA, BREAST (ICD-174.9) Followed by Duwayne Heck & DrIngram... neg mammogram 10/10...  Problem # 6:  DEGENERATIVE JOINT DISEASE, GENERALIZED (ICD-715.00) Shje is doing reasonably well & exercises regularly...  Problem # 7:  OSTEOPOROSIS (ICD-733.00) BMD per DrBertrand... continue Rx. Her updated medication list for this problem includes:    Fosamax 70 Mg Tabs (Alendronate sodium) .Marland Kitchen... 1 tab by mouth each week...  Problem # 8:  OTHER MEDICAL PROBLEMS AS NOTED>>>  Complete Medication List: 1)  Labetalol Hcl 200 Mg Tabs (Labetalol hcl) .... Take 1 tablet two times a day 2)  Lisinopril 20 Mg Tabs (Lisinopril) .Marland Kitchen.. 1 tab by mouth once daily.Marland KitchenMarland Kitchen 3)  Lasix 40 Mg Tabs (Furosemide) .... Take 1 tablet by mouth once a day 4)  Zocor 20 Mg Tabs (Simvastatin) .... Take one at bedtime 5)  Metformin Hcl 500 Mg Tabs (Metformin hcl) .... Take one tablet by mouth every morning 6)  Fosamax 70 Mg Tabs (Alendronate sodium) .Marland Kitchen.. 1 tab by mouth each week... 7)  Caltrate 600+d Plus 600-400 Mg-unit Tabs (Calcium carbonate-vit d-min) .Marland Kitchen.. 1 tab by mouth two times a day... 8)  Vitamin D 1000 Unit Tabs (Cholecalciferol) .... Take 1 tablet by mouth once a day 9)  Alprazolam 0.25 Mg Tabs (Alprazolam) .... Take 1-2 tablets by mouth three times a day as needed for nerves  Other Orders: Prescription Created Electronically [phone])  Patient Instructions: 1)  Today we updated your med list- see below.... 2)  Continue your current medications the same... 3)  We reviewed your recent lab work... 4)  Stay as active as  possible... 5)  Call for any problems.Marland KitchenMarland Kitchen 6)  Please schedule a follow-up appointment in 6 months. Prescriptions: ALPRAZOLAM 0.25 MG TABS (ALPRAZOLAM) take 1-2 tablets by mouth three times a day as needed for nerves  #100 x 4   Entered and Authorized by:   Michele Mcalpine MD   Signed by:   Michele Mcalpine MD on 10/18/2009  Method used:   Print then Give to Patient   RxID:   [id number] FOSAMAX 70 MG  TABS (ALENDRONATE SODIUM) 1 tab by mouth each week...  #12 x 4   Entered and Authorized by:   Michele Mcalpine MD   Signed by:   Michele Mcalpine MD on 10/18/2009   Method used:   Print then Give to Patient   RxID:   [id number] METFORMIN HCL 500 MG TABS (METFORMIN HCL) take one tablet by mouth every morning  #90 x 4   Entered and Authorized by:   Michele Mcalpine MD   Signed by:   Michele Mcalpine MD on 10/18/2009   Method used:   Print then Give to Patient   RxID:   [id number] LASIX 40 MG  TABS (FUROSEMIDE) Take 1 tablet by mouth once a day  #90 x 4   Entered and Authorized by:   Michele Mcalpine MD   Signed by:   Michele Mcalpine MD on 10/18/2009   Method used:   Print then Give to Patient   RxID:   [id number] LISINOPRIL 20 MG TABS (LISINOPRIL) 1 tab by mouth once daily...  #90 x 4   Entered and Authorized by:   Michele Mcalpine MD   Signed by:   Michele Mcalpine MD on 10/18/2009   Method used:   Print then Give to Patient   RxID:   [id number] LABETALOL HCL 200 MG  TABS (LABETALOL HCL) take 1 tablet two times a day  #180 x 4   Entered and Authorized by:   Michele Mcalpine MD   Signed by:   Michele Mcalpine MD on 10/18/2009   Method used:   Print then Give to Patient   RxID:   [id number]

## 2010-09-06 NOTE — Progress Notes (Signed)
Summary: questions regarding change from Labetalol to Carvedilol  Phone Note Call from Patient   Caller: Patient Call For: nadel Summary of Call: pt not sure why she received  carvedilol generic for coreg from [facility]. Initial call taken by: Rickard Patience,  May 17, 2010 2:19 PM  Follow-up for Phone Call        called and spoke with pt.  pt states she received Carvedilol in the mail for [facility] and unsure what she received this.  Pt confused and upset that no one called her with changes to her meds. After reviewing pt's chart, Leigh appended to the Img: order/[facility] dated 04/27/2010.... that SN changed pt's Labetalol to Carvedilol d/t Carvedilol being generic and cheaper.  Pt verbalized her understanding.  Pt asked if she could finish up her Labetalol first since she has some tabs left and doesn't want to waste money and then switch over to Carvedilol.  Informed pt this would be fine.  Pt is to call us back if she has any further questions or concerns.  Again, I apologized to pt for the confusion.  Arman Filter LPN  May 17, 2010 3:33 PM

## 2010-09-06 NOTE — Medication Information (Signed)
[facility]: [facility]   Imported By: Lester [place]moor 02/15/2010 10:11:13  _____________________________________________________________________  External Attachment:    Type:   Image     Comment:   External Document

## 2010-09-06 NOTE — Assessment & Plan Note (Signed)
Summary: 6 months/apc   CC:  6 month ROV & review of mult medical problems....  History of Present Illness: 77 y/o WF here for a follow up visit... she has mult med problems as noted below...    ~  April 19, 2009:  DrLivesay stopped her Femara 4/10 due to worsening bone density & wedge deformities (+4.24yrs of Rx)... she is asking about the Shingles vaccine & we discussed this- due to her hx of shingles in the past she is rec to get the vaccine at the health dept... she is under incr stress w/ a family member dx w/ melanoma (granddaughter's husb)...   ~  October 18, 2009:  she was seen by TP 2/11 w/ incr edema & labs were OK- reviewed w/ pt... she has chr ven insuffic & rec to continue Lasix40, elim salt, wear support hose (but she says TED's are too hard for her to put on)...  BP controlled on meds;  hx CAD- followed by DrWeintraub w/o angina;  Chol controlled on Simva20;  BS controlled on Metformin...   ~  April 18, 2010:  73mo ROV- doing well overall but c/o some left hip area pain & we discussed checking XRay & trial MOBIC- if no better then Ortho eval (she notes hx bursitis in past)...  BP controlled on Labet/ Lisin/ Lasix;  denies CP/ palpit/ SOB/ ch in edema;  Chol & BS doing very well on her meds...  she saw [facility]/11- doing well, routine f/u... OK 2011 Flu vaccine today.    Current Problems:   HYPERTENSION (ICD-401.9) - controlled on LABETOLOL 200mg Bid, LISINOPRIL 20mg /d, LASIX 40mg /d... BP=122/70 here today & similar at home... feeling well; denies HA, fatigue, visual changes, CP, palipit, dizziness, syncope, dyspnea, etc...  ~  labs 2/11 showed normal chem's and BNP=157  CORONARY ARTERY DISEASE (ICD-414.00) - on ASA 81mg /d... no CP, palpit, SOB, etc... exerc= yard, walking...  ~  cath 2/91 by DrWeintraub showed 70% single vessel dis RCA & MVP...  VENOUS INSUFFICIENCY (ICD-459.81) - she has mod Ven insuffic w/ chr edema & dry skin dermatitis> knows to follow a low sodium  diet, elevate legs, wear support hose when able "[name]", & takes the LASIX.  HYPERCHOLESTEROLEMIA (ICD-272.0) - on SIMVASTATIN 20mg /d... tolerating well, +diet...  ~  FLP 5/08 showed TChol 162, TG 82, HDL 63, LDL 83...  ~  FLP 3/09 showed TChol 179, TG 67, HDL 73, LDL 93... rec- same med.  ~  FLP 3/10 showed TChol 163, TG 71, HDL 72, LDL 77  ~  FLP 2/11 showed TChol 182, TG 91, HDL 88, LDL 76  DM (ICD-250.00) - on diet + METFORMIN 500mg /d added 3/09... weight=178# (down 6# in 73mo)...   ~  labs 5/08 showed BS=166, HgA1c=6.5  ~  labs 3/09 showed BS= 161 & Metformin 500mg /d started...  ~  lab 9/09 showed BS= 111, HgA1c= 6.1.Marland KitchenMarland Kitchen rec- continue same.  ~  labs 3/10 showed BS= 149, A1c= 6.2  ~  labs 9/10 showed BS= 112, A1c= 5.8  ~  labs 2/11 showed BS= 115, A1c= 6.4  DIVERTICULOSIS OF COLON (ICD-562.10) - last colonoscopy was 11/04 by DrDBrodie showing divertics & polyp (not retrieved)... she denies abd pain, D/ C/ blood etc...  Hx of ADENOCARCINOMA, BREAST (ICD-174.9) - Dx'd 11/05 w/ part mastectomy for intracystic papillary carcinoma left breast... surg by Lurline Hare, followed by Duwayne [facility] and [facility] stopped 4/10 after 4.41yrs Rx...  ~  saw Du[facility]ne Heck 10/09- doing well, labs OK... f/u Mammogram was  neg,  f/u BMD on Femara showed osteopenia w/ TScores -1.4 to -2.2 (sl better in Spine, sl worse in hip)- continue present meds.  ~  saw DrIngram 12/09- doing well...  ~  saw DrLivesay 4/10 & Femara stopped...  ~  f/u Mammogram 10/10 at [facility]= neg...   ~  saw Du[facility]ne Heck 6/11- doing well, no changes made, f/u Prn.  DEGENERATIVE JOINT DISEASE, GENERALIZED (ICD-715.00) OSTEOPOROSIS (ICD-733.00) - she has osteopenia on scans from [facility]- on FOSAMAX 70mg /wk, +caltrate, +Vits w/ VitD.  ~  BMD 5/07 at [facility] w/ TScores -1.5 to -2.3.Marland KitchenMarland KitchenMarland Kitchen this was improved from 12/05 values...  ~  labs 9/09 showed Vit D leve3l = 32... rec- OTC Vit D supplement.  ~  BMD 10/09 at [facility] showed TScores -1.4 to  -2.2 (sl better in Spine, sl worse in hip)- continue present meds.  ~  f/u BMD due 10/11 & we will set this up for her.  ANXIETY DISORDER, GENERALIZED (ICD-300.02) - on ALPRAZOLAM 0.5mg  Prn...  Health Maintenance - she gets the yearly FLU vaccine;  has PNEUMOVAX 9/09;  ?last Tetanus vaccine...   Allergies: 1)  ! Codeine 2)  ! Macrodantin  Comments:  Nurse/Medical Assistant: The patient's medications and allergies were reviewed with the patient and were updated in the Medication and Allergy Lists.  Past History:  Past Medical History: HYPERTENSION (ICD-401.9) CORONARY ARTERY DISEASE (ICD-414.00) VENOUS INSUFFICIENCY (ICD-459.81) HYPERCHOLESTEROLEMIA (ICD-272.0) DM (ICD-250.00) DIVERTICULOSIS OF COLON (ICD-562.10) COLONIC POLYPS (ICD-211.3) Hx of ADENOCARCINOMA, BREAST (ICD-174.9) DEGENERATIVE JOINT DISEASE, GENERALIZED (ICD-715.00) OSTEOPOROSIS (ICD-733.00) ANXIETY DISORDER, GENERALIZED (ICD-300.02)  Past Surgical History: S/P cataract surgery S/P mastectomy for breast cancer  Family History: Reviewed history from 10/19/2008 and no changes required. Father died age 67 from heart disease Mother died age 89 from heart condition 4 Siblings- all Sisters- 2 died w/ Alz dis & heart attack 1 Sister is our pt= Audrey Reed  Social History: Reviewed history from 10/19/2008 and no changes required. Widow- husb died >48yrs ago 2 Sons- one died age 42 w/ kidney cancer 1 Son alive, Audrey Reed- he is the next of kin quit smoking in 1989 caffeine use:  2-3 cups per day no alcohol Retired from [employer] after 30+yrs..  Review of Systems      See HPI       The patient complains of decreased hearing, dyspnea on exertion, peripheral edema, and difficulty walking.  The patient denies anorexia, fever, weight loss, weight gain, vision loss, hoarseness, chest pain, syncope, prolonged cough, headaches, hemoptysis, abdominal pain, melena, hematochezia, severe indigestion/heartburn, hematuria,  incontinence, muscle weakness, suspicious skin lesions, transient blindness, depression, unusual weight change, abnormal bleeding, enlarged lymph nodes, and angioedema.    Vital Signs:  Patient profile:   77 year old female Height:      61 inches Weight:      174.13 pounds BMI:     33.02 O2 Sat:      94 % on Room air Temp:     96.7 degrees F oral Pulse rate:   72 / minute BP sitting:   122 / 70  (right arm) Cuff size:   regular  Vitals Entered By: Randell Loop CMA (April 18, 2010 12:10 PM)  O2 Sat at Rest %:  94 O2 Flow:  Room air CC: 6 month ROV & review of mult medical problems... Is Patient Diabetic? No Pain Assessment Patient in pain? no      Comments no changes in meds today   Physical Exam  Additional Exam:  WD, WN, 76 y/o  WF in NAD... GENERAL:  Alert & oriented; pleasant & cooperative... HEENT:  [place]/AT, EOM-wnl, PERRLA, EACs-clear, TMs-wnl, NOSE-clear, THROAT-clear & wnl. NECK:  Supple w/ fairROM; no JVD; normal carotid impulses w/o bruits; no thyromegaly or nodules palpated; no lymphadenopathy. CHEST:  Clear to P & A; without wheezes/ rales/ or rhonchi heard... HEART:  Regular Rhythm; without murmurs/ rubs/ or gallops detected... ABDOMEN:  Soft & nontender; mild panniculus; normal bowel sounds; no organomegaly or masses palpated... EXT: without deformities or arthritic changes; no varicose veins/ +venous insuffic/ 1+ edema. NEURO:  CN's intact;  no focal neuro deficits... DERM:  Dry skin dermatitis on LEs...    X-ray Musculoskeletal  Procedure date:  04/18/2010  Findings:      LEFT HIP - COMPLETE 2+ VIEW Comparison: None   Findings: Mild degenerative changes in the hips bilaterally.  SI joints are symmetric and unremarkable. No acute bony abnormality. Specifically, no fracture, subluxation, or dislocation.  Soft tissues are intact.   IMPRESSION: No acute findings.   Read By:  Charlett Nose,  M.D.    Impression & Recommendations:  Problem # 1:   DEGENERATIVE JOINT DISEASE, GENERALIZED (ICD-715.00) She has left hip pain>  XRays showed mild degen changes> try the Mobic, refer to ortho if symptoms worsen. Her updated medication list for this problem includes:    Meloxicam 7.5 Mg Tabs (Meloxicam) .Marland Kitchen... Take 1 tab by mouth once daily as needed for arthritis pain...  Orders: T-Hip Comp Left Min 2-views (73510TC)  Problem # 2:  HYPERTENSION (ICD-401.9) BP controlled>  same meds. Her updated medication list for this problem includes:    Labetalol Hcl 200 Mg Tabs (Labetalol hcl) .Marland Kitchen... Take 1 tablet two times a day    Lisinopril 20 Mg Tabs (Lisinopril) .Marland Kitchen... 1 tab by mouth once daily...    Lasix 40 Mg Tabs (Furosemide) .Marland Kitchen... Take 1 tablet by mouth once a day  Problem # 3:  CORONARY ARTERY DISEASE (ICD-414.00) Stable>  no angina etc... encouraged to be more active... Her updated medication list for this problem includes:    Labetalol Hcl 200 Mg Tabs (Labetalol hcl) .Marland Kitchen... Take 1 tablet two times a day    Lisinopril 20 Mg Tabs (Lisinopril) .Marland Kitchen... 1 tab by mouth once daily...    Lasix 40 Mg Tabs (Furosemide) .Marland Kitchen... Take 1 tablet by mouth once a day  Problem # 4:  VENOUS INSUFFICIENCY (ICD-459.81) Mod severe dis w/ dry skin dermatitis... reviewed no salt, elevation, etc... try moisturizing creams, bath oil, etc...   Problem # 5:  HYPERCHOLESTEROLEMIA (ICD-272.0) Stable on Simva20 + diet... Her updated medication list for this problem includes:    Zocor 20 Mg Tabs (Simvastatin) .Marland Kitchen... Take one at bedtime  Problem # 6:  DM (ICD-250.00) Stable on Metformin + diet... Her updated medication list for this problem includes:    Lisinopril 20 Mg Tabs (Lisinopril) .Marland Kitchen... 1 tab by mouth once daily...    Metformin Hcl 500 Mg Tabs (Metformin hcl) .Marland Kitchen... Take one tablet by mouth every morning  Problem # 7:  Hx of ADENOCARCINOMA, BREAST (ICD-174.9) Followed by Duwayne Heck & her note of 6/11 reviewed w/ pt...  Problem # 8:  OSTEOPOROSIS  (ICD-733.00) Continue meds & exercise... f/u BMD due 10/11 at [facility]... Her updated medication list for this problem includes:    Fosamax 70 Mg Tabs (Alendronate sodium) .Marland Kitchen... 1 tab by mouth each week...  Problem # 9:  OTHER MEDICAL PROBLEMS AS NOTED>>>  Complete Medication List: 1)  Labetalol Hcl 200 Mg Tabs (Labetalol hcl) .Marland KitchenMarland KitchenMarland Kitchen  Take 1 tablet two times a day 2)  Lisinopril 20 Mg Tabs (Lisinopril) .Marland Kitchen.. 1 tab by mouth once daily.Marland KitchenMarland Kitchen 3)  Lasix 40 Mg Tabs (Furosemide) .... Take 1 tablet by mouth once a day 4)  Zocor 20 Mg Tabs (Simvastatin) .... Take one at bedtime 5)  Metformin Hcl 500 Mg Tabs (Metformin hcl) .... Take one tablet by mouth every morning 6)  Fosamax 70 Mg Tabs (Alendronate sodium) .Marland Kitchen.. 1 tab by mouth each week... 7)  Caltrate 600+d Plus 600-400 Mg-unit Tabs (Calcium carbonate-vit d-min) .Marland Kitchen.. 1 tab by mouth two times a day... 8)  Vitamin D 1000 Unit Tabs (Cholecalciferol) .... Take 1 tablet by mouth once a day 9)  Alprazolam 0.25 Mg Tabs (Alprazolam) .... Take 1-2 tablets by mouth three times a day as needed for nerves 10)  Meloxicam 7.5 Mg Tabs (Meloxicam) .... Take 1 tab by mouth once daily as needed for arthritis pain...  Other Orders: Flu Vaccine 92yrs + MEDICARE PATIENTS (B2841) Administration Flu vaccine - MCR (L2440)  Patient Instructions: 1)  Today we updated your med list- see below.... 2)  Continue your current meds the same... 3)  Try the MOBIC (Meloxicam) one daily for the arthritis pain.Marland KitchenMarland Kitchen 4)  We will XRay your left hip & call you w/ the results.Marland KitchenMarland Kitchen 5)  Stay as active as possible, & watch those calories.Marland KitchenMarland Kitchen 6)  Today we gave you the 2011 Flu vaccine... 7)  We will request [facility] to do a Bone Density test 10/11 at the time of your next mammogram... 8)  Call for any questions.Marland KitchenMarland Kitchen 9)  Please schedule a follow-up appointment in 6 months, with FASTING blood work at that time... Prescriptions: MELOXICAM 7.5 MG TABS (MELOXICAM) take 1 tab by mouth once daily as  needed for arthritis pain...  #30 x 6   Entered and Authorized by:   Michele Mcalpine MD   Signed by:   Michele Mcalpine MD on 04/18/2010   Method used:   Print then Give to Patient   RxID:   [id number]    Flu Vaccine Consent Questions     Do you have a history of severe allergic reactions to this vaccine? no    Any prior history of allergic reactions to egg and/or gelatin? no    Do you have a sensitivity to the preservative Thimersol? no    Do you have a past history of Guillan-Barre Syndrome? no    Do you currently have an acute febrile illness? no    Have you ever had a severe reaction to latex? no    Vaccine information given and explained to patient? yes    Are you currently pregnant? no    Lot Number:[id number]   Exp Date:02/04/2011   Site Given  Right Deltoid IMflu   Randell Loop [facility]  April 18, 2010 12:57 PM

## 2010-09-06 NOTE — Assessment & Plan Note (Signed)
Summary: Acute NP office visit -edema   CC:  increased edema in both legs/feet/ankles x3days with some redness and and red splotches.  History of Present Illness: 77 y/o WF with known hx of DM, Hyperlipidemia, HTN   ~  April 19, 2009:  Dr[facility]ivesay stopped her Femara 4/10 due to worsening bone density & wedge deformities (+4.51yrs of Rx)... she is aking about the Shingles vaccine & we discussed this- due to her hx of shingles in the past she is rec to get the vaccine at the health dept... she is under incr stress w/ a family member dx w/ melanoma (granddaughter's husb)...  September 09, 2009--Presents for an acute office viist. Complains of increased edema in both legs/feet/ankles x3days with some redness, and red splotches. She has hx of chronic venous insufficiency w/  moderate edema on Lasix 40mg  once daily. She does not wear her TEDS stocking, can not get on and off. She has no associated chest pain , dyspnea or orthopnea. Has noticed sweeling is worse last week esp in evenings, no calf pain or known injury.      Medications Prior to Update: 1)  Labetalol Hcl 200 Mg  Tabs (Labetalol Hcl) .... Take 1 Tablet Two Times A Day 2)  Lisinopril 20 Mg Tabs (Lisinopril) .Marland Kitchen.. 1 Tab By Mouth Once Daily.Marland KitchenMarland Kitchen 3)  Lasix 40 Mg  Tabs (Furosemide) .... Take 1 Tablet By Mouth Once A Day 4)  Zocor 20 Mg  Tabs (Simvastatin) .... Take One At Bedtime 5)  Metformin Hcl 500 Mg Tabs (Metformin Hcl) .... Take One Tablet By Mouth Every Morning 6)  Fosamax 70 Mg  Tabs (Alendronate Sodium) .Marland Kitchen.. 1 Tab By Mouth Each Week... 7)  Caltrate 600+d Plus 600-400 Mg-Unit  Tabs (Calcium Carbonate-Vit D-Min) .Marland Kitchen.. 1 Tab By Mouth Two Times A Day... 8)  Vitamin D 1000 Unit Tabs (Cholecalciferol) .... Take 1 Tablet By Mouth Once A Day 9)  Alprazolam 0.25 Mg Tabs (Alprazolam) .... Take 1-2 Tablets By Mouth Three Times A Day As Needed For Nerves  Current Medications (verified): 1)  Labetalol Hcl 200 Mg  Tabs (Labetalol Hcl) .... Take 1  Tablet Two Times A Day 2)  Lisinopril 20 Mg Tabs (Lisinopril) .Marland Kitchen.. 1 Tab By Mouth Once Daily.Marland KitchenMarland Kitchen 3)  Lasix 40 Mg  Tabs (Furosemide) .... Take 1 Tablet By Mouth Once A Day 4)  Zocor 20 Mg  Tabs (Simvastatin) .... Take One At Bedtime 5)  Metformin Hcl 500 Mg Tabs (Metformin Hcl) .... Take One Tablet By Mouth Every Morning 6)  Fosamax 70 Mg  Tabs (Alendronate Sodium) .Marland Kitchen.. 1 Tab By Mouth Each Week... 7)  Caltrate 600+d Plus 600-400 Mg-Unit  Tabs (Calcium Carbonate-Vit D-Min) .Marland Kitchen.. 1 Tab By Mouth Two Times A Day... 8)  Vitamin D 1000 Unit Tabs (Cholecalciferol) .... Take 1 Tablet By Mouth Once A Day 9)  Alprazolam 0.25 Mg Tabs (Alprazolam) .... Take 1-2 Tablets By Mouth Three Times A Day As Needed For Nerves  Allergies (verified): 1)  ! Codeine 2)  ! Macrodantin  Past History:  Past Surgical History: Last updated: 04/19/2009 S/P cataract surgery S/P mastectomy for breast cancer  Family History: Last updated: 11-09-08 Father died age 63 from heart disease Mother died age 35 from heart condition 4 Siblings- all Sisters- 2 died w/ Alz dis & heart attack 1 Sister is our pt= Agricultural consultant  Social History: Last updated: 11-09-2008 Widow- husb died >25yrs ago 2 Sons- one died age 76 w/ kidney cancer 1 Son alive, [name]-  he is the next of kin quit smoking in 1989 caffeine use:  2-3 cups per day no alcohol Retired from [employer] after 30+yrs..  Risk Factors: Smoking Status: quit (05/28/2007)  Past Medical History: HYPERTENSION (ICD-401.9) - controlled on LABETOLOL 200mg Bid, LISINOPRIL 20mg /d, LASIX 40mg /d...    CORONARY ARTERY DISEASE (ICD-414.00) - no CP, palpit, SOB, etc... exerc= yard, walking...  ~  cath 2/91 by Dr[facility] showed 70% single vessel dis RCA & MVP...  VENOUS INSUFFICIENCY (ICD-459.81) - she has mod Ven insuffic w/ chr edema & knows to follow a low sodium diet, elevate legs, wear support hose when able...  HYPERCHOLESTEROLEMIA (ICD-272.0) - on SIMVASTATIN 20mg /d...  tolerating well, +diet...  ~  FLP 5/08 showed TChol 162, TG 82, HDL 63, LDL 83...  ~  FLP 3/09 showed TChol 179, TG 67, HDL 73, LDL 93... rec- same med.  ~  FLP 3/10 showed TChol 163, TG 71, HDL 72, LDL 77  DM (ICD-250.00) - on diet + METFORMIN 500mg /d added 3/09... weight=178# (down 6# in 74mo)...   ~  labs 5/08 showed BS=166, HgA1c=6.5  ~  labs 3/09 showed BS= 161 & Metformin 500mg /d started...  ~  lab 9/09 showed BS= 111, HgA1c= 6.1.Marland KitchenMarland Kitchen rec- continue same.  ~  labs 3/10 showed BS= 149, A1c= 6.2  ~  labs 9/10 showed BS= 112, A1c= 5.8  DIVERTICULOSIS OF COLON (ICD-562.10) - last colonoscopy was 11/04 by Dr[facility] showing divertics & polyp (not retrieved)... she denies abd pain, D/ C/ blood etc...     Review of Systems      See HPI  Vital Signs:  Patient profile:   77 year old female Height:      61 inches Weight:      175 pounds O2 Sat:      96 % on Room air Temp:     97.8 degrees F oral Pulse rate:   68 / minute BP sitting:   142 / 88  (left arm) Cuff size:   regular  Vitals Entered By: Boone Master CNA (September 09, 2009 2:22 PM)  O2 Flow:  Room air CC: increased edema in both legs/feet/ankles x3days with some redness, and red splotches Is Patient Diabetic? Yes Comments Medications reviewed with patient Daytime contact number verified with patient. Boone Master CNA  September 09, 2009 2:24 PM    Physical Exam  Additional Exam:  WD, WN, 77 y/o WF in NAD... GENERAL:  Alert & oriented; pleasant & cooperative... HEENT:  [place]/AT, EOM-wnl, PERRLA, EACs-clear, TMs-wnl, NOSE-clear, THROAT-clear & wnl. NECK:  Supple w/ fairROM; no JVD; normal carotid impulses w/o bruits; no thyromegaly or nodules palpated; no lymphadenopathy. CHEST:  Clear to P & A; without wheezes/ rales/ or rhonchi heard... HEART:  Regular Rhythm; without murmurs/ rubs/ or gallops detected... ABDOMEN:  Soft & nontender; mild panniculus; normal bowel sounds; no organomegaly or masses palpated... EXT: without  deformities or arthritic changes; no varicose veins/ +venous insuffic/ 2+ edema., brawny skin discoloration along distal legs/ankle , stasis dermatatic changes. , skiin is warm , not hot to touch, pulse sintact,  NEURO:  CN's intact;  no focal neuro deficits... DERM:  No lesions noted; no rash etc...     Impression & Recommendations:  Problem # 1:  VENOUS INSUFFICIENCY (ICD-459.81)  Flare w/ increased peripheral edema. No sign of cellulitis at this time Will check labs w/ bmet/bnp  REC:  Extra Lasix 40mg  once daily for 2 days Low salt diet  Keep legs elevated as much as possible Support  hose if you can tolearte.  Soak feet to get dry skin off.  I will get lab work today.  follow up Dr. Kriste Basque in 4 weeks and as needed  Please contact office for sooner follow up if symptoms do not improve or worsen   Orders: TLB-BNP (B-Natriuretic Peptide) (83880-BNPR) Est. Patient Level IV (16109)  Problem # 2:  DM (ICD-250.00) labs pending.  Her updated medication list for this problem includes:    Metformin Hcl 500 Mg Tabs (Metformin hcl) .Marland Kitchen... Take one tablet by mouth every morning  Orders: TLB-BMP (Basic Metabolic Panel-BMET) (80048-METABOL) TLB-A1C / Hgb A1C (Glycohemoglobin) (83036-A1C) Est. Patient Level IV (60454)  Problem # 3:  HYPERTENSION (ICD-401.9)  controlled on rx  Her updated medication list for this problem includes:    Labetalol Hcl 200 Mg Tabs (Labetalol hcl) .Marland Kitchen... Take 1 tablet two times a day    Lisinopril 20 Mg Tabs (Lisinopril) .Marland Kitchen... 1 tab by mouth once daily...    Lasix 40 Mg Tabs (Furosemide) .Marland Kitchen... Take 1 tablet by mouth once a day  BP today: 142/88 Prior BP: 144/80 (04/19/2009)  Labs Reviewed: K+: 4.8 (04/19/2009) Creat: : 1.2 (04/19/2009)   Chol: 163 (10/20/2008)   HDL: 72.10 (10/20/2008)   LDL: 77 (10/20/2008)   TG: 71.0 (10/20/2008)  Orders: Est. Patient Level IV (09811)  Complete Medication List: 1)  Labetalol Hcl 200 Mg Tabs (Labetalol hcl) ....  Take 1 tablet two times a day 2)  Lisinopril 20 Mg Tabs (Lisinopril) .Marland Kitchen.. 1 tab by mouth once daily.Marland KitchenMarland Kitchen 3)  Lasix 40 Mg Tabs (Furosemide) .... Take 1 tablet by mouth once a day 4)  Zocor 20 Mg Tabs (Simvastatin) .... Take one at bedtime 5)  Metformin Hcl 500 Mg Tabs (Metformin hcl) .... Take one tablet by mouth every morning 6)  Fosamax 70 Mg Tabs (Alendronate sodium) .Marland Kitchen.. 1 tab by mouth each week... 7)  Caltrate 600+d Plus 600-400 Mg-unit Tabs (Calcium carbonate-vit d-min) .Marland Kitchen.. 1 tab by mouth two times a day... 8)  Vitamin D 1000 Unit Tabs (Cholecalciferol) .... Take 1 tablet by mouth once a day 9)  Alprazolam 0.25 Mg Tabs (Alprazolam) .... Take 1-2 tablets by mouth three times a day as needed for nerves  Other Orders: TLB-Lipid Panel (80061-LIPID)  Patient Instructions: 1)  Extra Lasix 40mg  once daily for 2 days 2)  Low salt diet  3)  Keep legs elevated as much as possible 4)  Support hose if you can tolearte.  5)  Soak feet to get dry skin off.  6)  I will get lab work today.  7)  follow up Dr. Kriste Basque in 4 weeks and as needed  8)  Please contact office for sooner follow up if symptoms do not improve or worsen    Immunization History:  Influenza Immunization History:    Influenza:  historical (05/07/2009)

## 2010-09-06 NOTE — Progress Notes (Signed)
Summary: Orders for BMD / [place]  Orders for BMD / [place]   Imported By: Lennie Odor 04/21/2010 16:08:00  _____________________________________________________________________  External Attachment:    Type:   Image     Comment:   External Document

## 2010-09-06 NOTE — Letter (Signed)
[facility]   Imported By: Lennie Odor 02/09/2010 15:58:50  _____________________________________________________________________  External Attachment:    Type:   Image     Comment:   External Document

## 2010-09-06 NOTE — Medication Information (Signed)
Summary: [facility]  [facility]   Imported By: Lester Brundidge 02/04/2010 10:05:42  _____________________________________________________________________  External Attachment:    Type:   Image     Comment:   External Document

## 2010-10-17 ENCOUNTER — Encounter: Payer: Self-pay | Admitting: Pulmonary Disease

## 2010-10-17 ENCOUNTER — Ambulatory Visit (INDEPENDENT_AMBULATORY_CARE_PROVIDER_SITE_OTHER): Payer: Medicare Other | Admitting: Pulmonary Disease

## 2010-10-17 DIAGNOSIS — I872 Venous insufficiency (chronic) (peripheral): Secondary | ICD-10-CM

## 2010-10-17 DIAGNOSIS — C50919 Malignant neoplasm of unspecified site of unspecified female breast: Secondary | ICD-10-CM

## 2010-10-17 DIAGNOSIS — K5730 Diverticulosis of large intestine without perforation or abscess without bleeding: Secondary | ICD-10-CM

## 2010-10-17 DIAGNOSIS — E119 Type 2 diabetes mellitus without complications: Secondary | ICD-10-CM

## 2010-10-17 DIAGNOSIS — I10 Essential (primary) hypertension: Secondary | ICD-10-CM

## 2010-10-17 DIAGNOSIS — I2510 Atherosclerotic heart disease of native coronary artery without angina pectoris: Secondary | ICD-10-CM

## 2010-10-17 DIAGNOSIS — E7800 Pure hypercholesterolemia, unspecified: Secondary | ICD-10-CM

## 2010-10-17 DIAGNOSIS — D126 Benign neoplasm of colon, unspecified: Secondary | ICD-10-CM

## 2010-10-18 ENCOUNTER — Encounter: Payer: Self-pay | Admitting: Pulmonary Disease

## 2010-10-18 ENCOUNTER — Other Ambulatory Visit: Payer: Medicare Other

## 2010-10-18 ENCOUNTER — Other Ambulatory Visit: Payer: Self-pay | Admitting: Pulmonary Disease

## 2010-10-18 ENCOUNTER — Encounter (INDEPENDENT_AMBULATORY_CARE_PROVIDER_SITE_OTHER): Payer: Self-pay | Admitting: *Deleted

## 2010-10-18 DIAGNOSIS — D649 Anemia, unspecified: Secondary | ICD-10-CM

## 2010-10-18 DIAGNOSIS — I10 Essential (primary) hypertension: Secondary | ICD-10-CM

## 2010-10-18 DIAGNOSIS — E7800 Pure hypercholesterolemia, unspecified: Secondary | ICD-10-CM

## 2010-10-18 DIAGNOSIS — R748 Abnormal levels of other serum enzymes: Secondary | ICD-10-CM

## 2010-10-18 DIAGNOSIS — E039 Hypothyroidism, unspecified: Secondary | ICD-10-CM

## 2010-10-18 LAB — CBC WITH DIFFERENTIAL/PLATELET
Basophils Absolute: 0 10*3/uL (ref 0.0–0.1)
Basophils Relative: 0.7 % (ref 0.0–3.0)
Eosinophils Absolute: 0.3 10*3/uL (ref 0.0–0.7)
Eosinophils Relative: 5 % (ref 0.0–5.0)
HCT: 41 % (ref 36.0–46.0)
Hemoglobin: 14.1 g/dL (ref 12.0–15.0)
Lymphocytes Relative: 30.1 % (ref 12.0–46.0)
Lymphs Abs: 1.9 10*3/uL (ref 0.7–4.0)
MCHC: 34.5 g/dL (ref 30.0–36.0)
MCV: 88.4 fl (ref 78.0–100.0)
Monocytes Absolute: 0.6 10*3/uL (ref 0.1–1.0)
Monocytes Relative: 10.1 % (ref 3.0–12.0)
Neutro Abs: 3.4 10*3/uL (ref 1.4–7.7)
Neutrophils Relative %: 54.1 % (ref 43.0–77.0)
Platelets: 177 10*3/uL (ref 150.0–400.0)
RBC: 4.63 Mil/uL (ref 3.87–5.11)
RDW: 15.1 % — ABNORMAL HIGH (ref 11.5–14.6)
WBC: 6.3 10*3/uL (ref 4.5–10.5)

## 2010-10-18 LAB — BASIC METABOLIC PANEL
BUN: 31 mg/dL — ABNORMAL HIGH (ref 6–23)
CO2: 30 mEq/L (ref 19–32)
Calcium: 9.8 mg/dL (ref 8.4–10.5)
Chloride: 104 mEq/L (ref 96–112)
Creatinine, Ser: 1.1 mg/dL (ref 0.4–1.2)
GFR: 48.98 mL/min — ABNORMAL LOW (ref >60.00)
Glucose, Bld: 135 mg/dL — ABNORMAL HIGH (ref 70–99)
Potassium: 4.7 mEq/L (ref 3.5–5.1)
Sodium: 140 mEq/L (ref 135–145)

## 2010-10-18 LAB — LIPID PANEL
Cholesterol: 167 mg/dL (ref 0–200)
HDL: 78.9 mg/dL (ref >39.00)
LDL Cholesterol: 73 mg/dL (ref 0–99)
Total CHOL/HDL Ratio: 2
Triglycerides: 78 mg/dL (ref 0.0–149.0)
VLDL: 15.6 mg/dL (ref 0.0–40.0)

## 2010-10-18 LAB — HEPATIC FUNCTION PANEL
ALT: 13 U/L (ref 0–35)
AST: 18 U/L (ref 0–37)
Albumin: 3.9 g/dL (ref 3.5–5.2)
Alkaline Phosphatase: 33 U/L — ABNORMAL LOW (ref 39–117)
Bilirubin, Direct: 0.2 mg/dL (ref 0.0–0.3)
Total Bilirubin: 0.8 mg/dL (ref 0.3–1.2)
Total Protein: 6.3 g/dL (ref 6.0–8.3)

## 2010-10-18 LAB — TSH: TSH: 2.16 u[IU]/mL (ref 0.35–5.50)

## 2010-10-19 LAB — CONVERTED CEMR LAB: Vit D, 25-Hydroxy: 39 ng/mL (ref 30–89)

## 2010-10-25 NOTE — Assessment & Plan Note (Signed)
Summary: 6 month rov   CC:  6 month ROV & review of mult medical problems....  History of Present Illness: 77 y/o WF here for a follow up visit... she has mult med problems as noted below...    ~  April 18, 2010:  62mo ROV- doing well overall but c/o some left hip area pain & we discussed checking XRay & trial MOBIC- if no better then Ortho eval (she notes hx bursitis in past)...  BP controlled on Labet/ Lisin/ Lasix;  denies CP/ palpit/ SOB/ ch in edema;  Chol & BS doing very well on her meds...  she saw [facility] 6/11- doing well, routine f/u... OK 2011 Flu vaccine today.   ~  October 17, 2010:  62mo ROV & improved overall> less hip pain & uses the Mobic Prn...    Hx HBP/ CAD/ VI>  BP controlled on meds= 142/84 today & denies CP, palpit, SOB, edema, etc...    Chol>  stable on Simva20 & FLP shows TChol 167, TG 78, HDL 79, LDL 73... continue same.    DM>  regulated w/ Metform500/d & BS= 135, A1c wasn't done...    Hx Breast Cancer>  off Femara, ?released by Duwayne Heck, Mammogram 10/11 was OK w/ f/u 64yr & monthly self exam recommended...     DJD/ Osteop>  Mobic helps, had BMD at [facility] 10/11 showing TScores -0.9 in Spine (16% improved), & -1.8 in left FemNeck (6% worse);  on Fosamax + calcium/ mvi/ vit d...   Current Problems:   HYPERTENSION (ICD-401.9) - controlled on LABETOLOL 200mg Bid, LISINOPRIL 20mg /d, LASIX 40mg /d... BP OK here today & similar at home... feeling well; denies HA, fatigue, visual changes, CP, palipit, dizziness, syncope, dyspnea, etc...  ~  labs 2/11 showed normal chem's and BNP=157  CORONARY ARTERY DISEASE (ICD-414.00) - on ASA 81mg /d... no CP, palpit, SOB, etc... exerc= yard, walking...  ~  cath 2/91 by DrWeintraub showed 70% single vessel dis RCA & MVP...  VENOUS INSUFFICIENCY (ICD-459.81) - she has mod Ven insuffic w/ chr edema & dry skin dermatitis> knows to follow a low sodium diet, elevate legs, wear support hose when able "[name]", & takes the  LASIX.  HYPERCHOLESTEROLEMIA (ICD-272.0) - on SIMVASTATIN 20mg /d... tolerating well, +diet...  ~  FLP 5/08 showed TChol 162, TG 82, HDL 63, LDL 83...  ~  FLP 3/09 showed TChol 179, TG 67, HDL 73, LDL 93... rec- same med.  ~  FLP 3/10 showed TChol 163, TG 71, HDL 72, LDL 77  ~  FLP 2/11 showed TChol 182, TG 91, HDL 88, LDL 76  DM (ICD-250.00) - on diet + METFORMIN 500mg /d added 3/09... weight=178# (down 6# in 62mo)...   ~  labs 5/08 showed BS=166, HgA1c=6.5  ~  labs 3/09 showed BS= 161 & Metformin 500mg /d started...  ~  lab 9/09 showed BS= 111, HgA1c= 6.1.Marland KitchenMarland Kitchen rec- continue same.  ~  labs 3/10 showed BS= 149, A1c= 6.2  ~  labs 9/10 showed BS= 112, A1c= 5.8  ~  labs 2/11 showed BS= 115, A1c= 6.4  DIVERTICULOSIS OF COLON (ICD-562.10) - last colonoscopy was 11/04 by DrDBrodie showing divertics & polyp (not retrieved)... she denies abd pain, D/ C/ blood etc...  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 surg by Lurline Hare, followed by Duwayne Heck and [facility] stopped 4/10 after 4.70yrs Rx...  ~  saw Duwayne Heck 10/09- doing well, labs OK... f/u Mammogram was neg,  f/u BMD on Femara  showed osteopenia w/ TScores -1.4 to -2.2 (sl better in Spine, sl worse in hip)- continue present meds.  ~  saw DrIngram 12/09- doing well...  ~  saw DrLivesay 4/10 & Femara stopped...  ~  f/u Mammogram 10/10 at [facility]= neg...   ~  saw Duwayne Heck 6/11- doing well, no changes made, f/u Prn.  ~  f/u Mammogram 10/11 at [facility] showed benign findings, no worrisome abn, f/u 68yr.  DEGENERATIVE JOINT DISEASE, GENERALIZED (ICD-715.00) OSTEOPOROSIS (ICD-733.00) - she has osteopenia on [facility]- on FOSAMAX 70mg /wk, +Caltrate, +Vits w/ VitD.  ~  BMD 5/07 at [facility] w/ TScores -1.5 to -2.3.Marland KitchenMarland KitchenMarland Kitchen this was improved from 12/05 values...  ~  labs 9/09 showed Vit D leve3l = 32... rec- OTC Vit D supplement.  ~  BMD 10/09 at [facility] showed TScores -1.4 to -2.2  (sl better in Spine, sl worse in hip)- continue present meds.  ~  f/u BMD due 10/11> done at [facility] w/ TScores -0.9 in Spine (16% improved), & -1.8 in left FemNeck (6% worse).  ANXIETY DISORDER, GENERALIZED (ICD-300.02) - on ALPRAZOLAM 0.25mg  Prn... her daughter's husb has Melanoma & it is very stressful to the family.  Health Maintenance - she gets the yearly FLU vaccine;  has PNEUMOVAX 9/09;  ?last Tetanus vaccine...   Allergies: 1)  ! Codeine 2)  ! Macrodantin  Comments:  Nurse/Medical Assistant: The patient's medications and allergies were reviewed with the patient and were updated in the Medication and Allergy Lists.  Past History:  Past Medical History: HYPERTENSION (ICD-401.9) CORONARY ARTERY DISEASE (ICD-414.00) VENOUS INSUFFICIENCY (ICD-459.81) HYPERCHOLESTEROLEMIA (ICD-272.0) DM (ICD-250.00) DIVERTICULOSIS OF COLON (ICD-562.10) COLONIC POLYPS (ICD-211.3) Hx of ADENOCARCINOMA, BREAST (ICD-174.9) DEGENERATIVE JOINT DISEASE, GENERALIZED (ICD-715.00) OSTEOPOROSIS (ICD-733.00) ANXIETY DISORDER, GENERALIZED (ICD-300.02)  Past Surgical History: S/P cataract surgery S/P mastectomy for breast cancer  Family History: Reviewed history from 04/18/2010 and no changes required. Father died age 75 from heart disease Mother died age 55 from heart condition 4 Siblings- all Sisters- 2 died w/ Alz dis & heart attack 1 Sister is [name]  Social History: Reviewed history from 04/18/2010 and no changes required. Widow- husb died >41yrs ago 2 Sons- one died age 16 w/ kidney cancer 1 Son alive, [name]- he is the next of kin quit smoking in 1989 caffeine use:  2-3 cups per day no alcohol Retired from [employer] after 30+yrs..  Review of Systems      See HPI       The patient complains of dyspnea on exertion and difficulty walking.  The patient denies anorexia, fever, weight loss, weight gain, vision loss, decreased hearing, hoarseness, chest pain, syncope, peripheral  edema, prolonged cough, headaches, hemoptysis, abdominal pain, melena, hematochezia, severe indigestion/heartburn, hematuria, incontinence, muscle weakness, suspicious skin lesions, transient blindness, depression, unusual weight change, abnormal bleeding, enlarged lymph nodes, and angioedema.    Vital Signs:  Patient profile:   77 year old female Height:      61 inches Weight:      168.38 pounds BMI:     31.93 O2 Sat:      98 % on Room air Temp:     96.8 degrees F oral Pulse rate:   48 / minute BP sitting:   142 / 84  (right arm) Cuff size:   regular  Vitals Entered By: Randell Loop CMA (October 17, 2010 11:55 AM)  O2 Sat at Rest %:  98 O2 Flow:  Room air  Physical Exam  Additional Exam:  WD, WN, 76 y/o  WF in NAD... GENERAL:  Alert & oriented; pleasant & cooperative... HEENT:  /AT, EOM-wnl, PERRLA, EACs-clear, TMs-wnl, NOSE-clear, THROAT-clear & wnl. NECK:  Supple w/ fairROM; no JVD; normal carotid impulses w/o bruits; no thyromegaly or nodules palpated; no lymphadenopathy. CHEST:  Clear to P & A; without wheezes/ rales/ or rhonchi heard... HEART:  Regular Rhythm; without murmurs/ rubs/ or gallops detected... ABDOMEN:  Soft & nontender; mild panniculus; normal bowel sounds; no organomegaly or masses palpated... EXT: without deformities or arthritic changes; no varicose veins/ +venous insuffic/ 1+ edema. NEURO:  CN's intact;  no focal neuro deficits... DERM:  Dry skin dermatitis on LEs...    Impression & Recommendations:  Problem # 1:  HYPERTENSION (ICD-401.9)    Hx HBP/ CAD/ VI>  BP controlled on meds= 142/84 today & denies CP, palpit, SOB, edema, etc... continue same Rx. Her updated medication list for this problem includes:    Coreg 12.5 Mg Tabs (Carvedilol) .Marland Kitchen... Take one tablet by mouth two times a day    Lisinopril 20 Mg Tabs (Lisinopril) .Marland Kitchen... 1 tab by mouth once daily...    Lasix 40 Mg Tabs (Furosemide) .Marland Kitchen... Take 1 tablet by mouth once a day  Problem # 2:   HYPERCHOLESTEROLEMIA (ICD-272.0)    Chol>  stable on Simva20 & FLP shows TChol 167, TG 78, HDL 79, LDL 73... continue same. Her updated medication list for this problem includes:    Zocor 20 Mg Tabs (Simvastatin) .Marland Kitchen... Take one at bedtime  Problem # 3:  DM (ICD-250.00)    DM>  regulated w/ Metform500/d & BS= 135, A1c wasn't done... continue diet/ exerc/ & Metformin Rx. Her updated medication list for this problem includes:    Lisinopril 20 Mg Tabs (Lisinopril) .Marland Kitchen... 1 tab by mouth once daily...    Metformin Hcl 500 Mg Tabs (Metformin hcl) .Marland Kitchen... Take one tablet by mouth every morning  Problem # 4:  DIVERTICULOSIS OF COLON (ICD-562.10) GI is stable>  continue same...  Problem # 5:  Hx of ADENOCARCINOMA, BREAST (ICD-174.9) Doing well, off Femara & Duwayne Heck has released her... Mammogram was neg 10/11 & f/u 30yr... encouraged monthly self exam...  Problem # 6:  DEGENERATIVE JOINT DISEASE, GENERALIZED (ICD-715.00) Stable on the Prn Meloxicam... Her updated medication list for this problem includes:    Meloxicam 7.5 Mg Tabs (Meloxicam) .Marland Kitchen... Take 1 tab by mouth once daily as needed for arthritis pain...  Problem # 7:  OSTEOPOROSIS (ICD-733.00) Stable on the Fosamax, calcium, MVI, Vit D & wt bearing exerc... Her updated medication list for this problem includes:    Fosamax 70 Mg Tabs (Alendronate sodium) .Marland Kitchen... 1 tab by mouth each week...  Problem # 8:  ANXIETY DISORDER, GENERALIZED (ICD-300.02) Stable on Prn Alpraz... Her updated medication list for this problem includes:    Alprazolam 0.25 Mg Tabs (Alprazolam) .Marland Kitchen... Take 1-2 tablets by mouth three times a day as needed for nerves  Complete Medication List: 1)  Coreg 12.5 Mg Tabs (Carvedilol) .... Take one tablet by mouth two times a day 2)  Lisinopril 20 Mg Tabs (Lisinopril) .Marland Kitchen.. 1 tab by mouth once daily.Marland KitchenMarland Kitchen 3)  Lasix 40 Mg Tabs (Furosemide) .... Take 1 tablet by mouth once a day 4)  Zocor 20 Mg Tabs (Simvastatin) .... Take one at  bedtime 5)  Metformin Hcl 500 Mg Tabs (Metformin hcl) .... Take one tablet by mouth every morning 6)  Fosamax 70 Mg Tabs (Alendronate sodium) .Marland Kitchen.. 1 tab by mouth each week... 7)  Caltrate 600+d Plus 600-400 Mg-unit Tabs (  Calcium carbonate-vit d-min) .Marland Kitchen.. 1 tab by mouth two times a day... 8)  Vitamin D 1000 Unit Tabs (Cholecalciferol) .... Take 1 tablet by mouth once a day 9)  Alprazolam 0.25 Mg Tabs (Alprazolam) .... Take 1-2 tablets by mouth three times a day as needed for nerves 10)  Meloxicam 7.5 Mg Tabs (Meloxicam) .... Take 1 tab by mouth once daily as needed for arthritis pain...  Patient Instructions: 1)  Today we updated your med list- see below.... 2)  Continue your current meds the same... 3)  Please return to our lab in the AM for your FASTING blood work...  4)  Then please call the "phone tree" in a few days for your lab results.Marland KitchenMarland Kitchen  5)  Stay as active as possible but STAY SAFE.Marland KitchenMarland Kitchen 6)  Call for any problems.Marland KitchenMarland Kitchen 7)  Please schedule a follow-up appointment in 6 months.   Immunization History:  Tetanus/Td Immunization History:    Tetanus/Td:  historical (10/27/2003)

## 2010-10-29 ENCOUNTER — Other Ambulatory Visit: Payer: Self-pay | Admitting: Pulmonary Disease

## 2010-11-03 ENCOUNTER — Other Ambulatory Visit: Payer: Self-pay | Admitting: Pulmonary Disease

## 2010-11-26 ENCOUNTER — Other Ambulatory Visit: Payer: Self-pay | Admitting: Pulmonary Disease

## 2010-12-23 NOTE — Op Note (Signed)
NAMEBRITTNYE, Audrey Reed                 ACCOUNT NO.:  [id number]   MEDICAL RECORD NO.:  [id number]          PATIENT TYPE:  AMB   LOCATION:  [facility]SC                          FACILITY:  [facility]   PHYSICIAN:  Angelia Mould. Derrell Lolling, M.D.DATE OF BIRTH:  04/05/1918   DATE OF PROCEDURE:  06/10/2004  DATE OF DISCHARGE:                                 OPERATIVE REPORT   PREOPERATIVE DIAGNOSIS:  1.  Carcinoma of the left breast (3 o'clock position).  2.  Left breast mass (9 o'clock position).   POSTOPERATIVE DIAGNOSIS:  1.  Carcinoma of the left breast (3 o'clock position).  2.  Left breast mass (9 o'clock position).   OPERATION PERFORMED:  1.  Left partial mastectomy with needle localization and specimen      mammography.  2.  Excisional biopsy left breast mass at the 9 o'clock position.   SURGEON:  Angelia Mould. Derrell Lolling, M.D.   OPERATIVE INDICATIONS:  This is an 77 year old white female who had  mammograms recently and an ultrasound recently which showed a 2.8 cm mass at  the 3 o'clock position of the left breast at the far lateral position of the  breast.  This area was biopsied with a core needle biopsy and showed  intracystic papillary carcinoma.  The pathologist could not tell whether  this was invasive or not.  ER and PR are strongly positive.  On exam, she  has an area of thickening in the left breast laterally at the 3:30 position.  There is no involvement of the axillary lymph nodes clinically.  Also noted  was a smooth, rounded mass in the left breast at the 9 o'clock position.  This felt more like a cyst or fibroadenoma but it had not been characterized  by any of the imaging studies.  I felt that this also needed to be excised.  She was brought to the operating room electively.   SURGICAL TECHNIQUE:  The patient underwent needle localization at [facility] and was then brought to [facility].  She  was brought to the operating room and placed supine on the  operating table.  General LMA anesthesia was induced.  The left breast and axilla were prepped  and draped in a sterile fashion.  0.5% Marcaine with epinephrine was used as  local infiltration anesthetic.  A curved circumareolar incision was made in  the left breast overlying the palpable abnormality.  The lateral insertion  site was further laterally.  Dissection was carried down into the breast  tissue and fairly soon within a few mm, there was felt to be some firm  tissue.  I could not tell whether this was fat necrosis from her biopsy or  whether this was tumor.  We widely excised around the tumor using  electrocautery and the knife.  We pulled this up and got under the tumor  quite nicely.  I marked the tumor specimen with markers in six orientations  and sent this for specimen mammogram.  The specimen mammogram from Dr. Kearney Hard  was that I had completely removed the tumor  with a margin.  I was concerned  about the superficial margin and excised some skin and subcutaneous tissue  on the medial as well as on the lateral edge and sent that separately to the  lab.  I discussed my concern about the superficial margin with Dr. Jimmy Picket.  Dr. Luisa Hart looked at the specimen and said that he thought  perhaps it was close on the superficial margin and perhaps a little bit  close on the deep margin, as well.  I felt that I had gotten well around the  deep margin and did not see any further tumor.  I elected to wait to see  what the histology showed.  The wound was irrigated with saline.  Hemostasis  was excellent.  Using skin hooks, I oriented the closure to have a good,  cosmetic closure.  Because of the extra skin resection, I closed the skin  with interrupted mattress and interrupted simple sutures of 3-0 nylon and  once this was done, the incision looked quite nice and cosmetic.   I then turned my attention to the palpable abnormality at the 9 o'clock  position.  This was several cm  medial to the areola.  I simply made a  transverse incision overlying the mass and excised it using sharp  dissection.  This mass contained some tan fluid which was somewhat turbid  and so I cultured it, although there was no evidence of infection.  This was  sent for routine histology.  The wound was copiously irrigated with saline.  Hemostasis was excellent.  The skin was closed with interrupted subcuticular  sutures of 4-0 Vicryl and Steri-Strips.  Clean bandages were placed over  both incisions covering the entire breast.  The patient tolerated the  procedure well and was taken to the recovery room in excellent condition.  Estimated blood loss about 25 mL.  Complications were none.  Sponge and  instrument counts were correct.      Hayw   HMI/MEDQ  D:  06/10/2004  T:  06/10/2004  Job:  [id number]   cc:   Lonzo Cloud. Kriste Basque, M.D. [facility]

## 2010-12-24 ENCOUNTER — Other Ambulatory Visit: Payer: Self-pay | Admitting: Pulmonary Disease

## 2010-12-27 ENCOUNTER — Telehealth: Payer: Self-pay | Admitting: Pulmonary Disease

## 2010-12-28 ENCOUNTER — Encounter: Payer: Self-pay | Admitting: Pulmonary Disease

## 2010-12-28 NOTE — Telephone Encounter (Signed)
Refill for alprazolam was called into the pharmacy on 12-27-10

## 2011-02-15 ENCOUNTER — Ambulatory Visit (INDEPENDENT_AMBULATORY_CARE_PROVIDER_SITE_OTHER): Payer: Medicare Other | Admitting: Adult Health

## 2011-02-15 ENCOUNTER — Ambulatory Visit (INDEPENDENT_AMBULATORY_CARE_PROVIDER_SITE_OTHER)
Admission: RE | Admit: 2011-02-15 | Discharge: 2011-02-15 | Disposition: A | Discharge status: Home / Self Care | Payer: Medicare Other | Source: Ambulatory Visit | Attending: Adult Health | Admitting: Adult Health

## 2011-02-15 ENCOUNTER — Encounter: Payer: Self-pay | Admitting: *Deleted

## 2011-02-15 VITALS — BP 130/80 | HR 51 | Wt 166.6 lb

## 2011-02-15 DIAGNOSIS — M545 Low back pain: Secondary | ICD-10-CM

## 2011-02-15 DIAGNOSIS — M159 Polyosteoarthritis, unspecified: Secondary | ICD-10-CM

## 2011-02-15 NOTE — Patient Instructions (Signed)
Finish Prednisone .  Alternate ice and heat, along back and hip  May take tylenol As needed  For pain .  Take Tramadol 50mg  1/2-1 Twice daily  As needed  Pain -may make you sleepy.  I will call with xray results.  Please contact office for sooner follow up if symptoms do not improve or worsen or seek emergency care

## 2011-02-17 ENCOUNTER — Ambulatory Visit (INDEPENDENT_AMBULATORY_CARE_PROVIDER_SITE_OTHER)
Admission: RE | Admit: 2011-02-17 | Discharge: 2011-02-17 | Disposition: A | Discharge status: Home / Self Care | Payer: Medicare Other | Source: Ambulatory Visit | Attending: Adult Health | Admitting: Adult Health

## 2011-02-17 DIAGNOSIS — M545 Low back pain: Secondary | ICD-10-CM

## 2011-02-20 ENCOUNTER — Telehealth: Payer: Self-pay | Admitting: Pulmonary Disease

## 2011-02-20 DIAGNOSIS — M25559 Pain in unspecified hip: Secondary | ICD-10-CM

## 2011-02-20 NOTE — Assessment & Plan Note (Addendum)
Left hip and back pain ? Strain --slow to resolve  However will check xray to r/o fx   Plan :  Finish Prednisone .  Alternate ice and heat, along back and hip  May take tylenol As needed  For pain .  Take Tramadol 50mg  1/2-1 Twice daily  As needed  Pain -may make you sleepy.  I will call with xray results.  Please contact office for sooner follow up if symptoms do not improve or worsen or seek emergency care

## 2011-02-20 NOTE — Telephone Encounter (Signed)
Spoke with pt. She was last seen by TP for hip pain on 02/13/11. She states that although the pain has improved quite a bit, still has occ very sharp pain that starts in her hip and radiates to leg, this occurs when walking or when she is lying down. Wants further recs from SN. Please advise, thanks!

## 2011-02-20 NOTE — Telephone Encounter (Signed)
Per SN---recs for ortho eval if these symptoms persist.  Refer to her ortho if she has one or [facility] is she needs one. thanks

## 2011-02-20 NOTE — Telephone Encounter (Signed)
Pt says she saw ortho years ago but cannot remember who it was with. Will send order for ortho referral to [facility]. Pt aware we will call her with that appt.

## 2011-02-20 NOTE — Progress Notes (Signed)
Subjective:    Patient ID: Audrey Reed, female    DOB: Mar 27, 1918, 77 y.o.   MRN: [id number]  HPI 77 y/o WF with known hx of HTN, CAD and Hyperlipidemia   ~ April 18, 2010: 73mo ROV- doing well overall but c/o some left hip area pain & we discussed checking XRay & trial MOBIC- if no better then Ortho eval (she notes hx bursitis in past)... BP controlled on Labet/ Lisin/ Lasix; denies CP/ palpit/ SOB/ ch in edema; Chol & BS doing very well on her meds... she saw [facility] 6/11- doing well, routine f/u... OK 2011 Flu vaccine today.   ~ October 17, 2010: 73mo ROV & improved overall> less hip pain & uses the Mobic Prn...  Hx HBP/ CAD/ VI> BP controlled on meds= 142/84 today & denies CP, palpit, SOB, edema, etc...  Chol> stable on Simva20 & FLP shows TChol 167, TG 78, HDL 79, LDL 73... continue same.  DM> regulated w/ Metform500/d & BS= 135, A1c wasn't done...  Hx Breast Cancer> off Femara, ?released by Duwayne Heck, Mammogram 10/11 was OK w/ f/u 8yr & monthly self exam recommended...  DJD/ Osteop> Mobic helps, had BMD at [facility] 10/11 showing TScores -0.9 in Spine (16% improved), & -1.8 in left FemNeck (6% worse); on Fosamax + calcium/ mvi/ vit d...   ~February 15, 2011 Acute OV  Pt presents for an acute office visit. Complains of c/o left hip pain runs down left leg,since July 5th,hard to walk on at times.  Says she has pain along low back and outside of left hip , pain goes down into left hip and thigh. NO leg weakness but very painful with standing and walking. No rash or swelling. NO fever or urinary symptoms. OTC not helping.  Says she has had this before "it feels like bursitis " .  No fall or known injury  [facility] and given [facility] pack , has few days left. NOt helping as of yet.    PMH :  HYPERTENSION (ICD-401.9) - controlled on LABETOLOL 200mg Bid, LISINOPRIL 20mg /d, LASIX 40mg /d.. ~ labs 2/11 showed normal chem's and BNP=157  CORONARY ARTERY DISEASE (ICD-414.00) - on ASA  81mg /d... no CP, palpit, SOB, etc... exerc= yard, walking...  ~ cath 2/91 by DrWeintraub showed 70% single vessel dis RCA & MVP...  VENOUS INSUFFICIENCY (ICD-459.81) - she has mod Ven insuffic w/ chr edema & dry skin dermatitis> knows to follow a low sodium diet, elevate legs, wear support hose when able "[name]", & takes the LASIX.  HYPERCHOLESTEROLEMIA (ICD-272.0) - on SIMVASTATIN 20mg /d... tolerating well, +diet...  ~ FLP 5/08 showed TChol 162, TG 82, HDL 63, LDL 83...  ~ FLP 3/09 showed TChol 179, TG 67, HDL 73, LDL 93... rec- same med.  ~ FLP 3/10 showed TChol 163, TG 71, HDL 72, LDL 77  ~ FLP 2/11 showed TChol 182, TG 91, HDL 88, LDL 76  DM (ICD-250.00) - on diet + METFORMIN 500mg /d added 3/09... weight=178# (down 6# in 73mo)...  ~ labs 5/08 showed BS=166, HgA1c=6.5  ~ labs 3/09 showed BS= 161 & Metformin 500mg /d started...  ~ lab 9/09 showed BS= 111, HgA1c= 6.1.Marland KitchenMarland Kitchen rec- continue same.  ~ labs 3/10 showed BS= 149, A1c= 6.2  ~ labs 9/10 showed BS= 112, A1c= 5.8  ~ labs 2/11 showed BS= 115, A1c= 6.4  DIVERTICULOSIS OF COLON (ICD-562.10) - last colonoscopy was 11/04 by DrDBrodie showing divertics & polyp (not retrieved)... she denies abd pain, D/ C/ blood etc..Marland Kitchen  Hx of ADENOCARCINOMA, BREAST (ICD-174.9) - Dx'd 11/05 w/ part mastectomy for papillary carcinoma left breast... surg by Lurline Hare, followed by Duwayne Heck and [facility] stopped 4/10 after 4.57yrs Rx...  ~ saw Duwayne Heck 10/09- doing well, labs OK... f/u Mammogram was neg, f/u BMD on Femara showed osteopenia w/ TScores -1.4 to -2.2 (sl better in Spine, sl worse in hip)- continue present meds.  ~ saw DrIngram 12/09- doing well...  ~ saw DrLivesay 4/10 & Femara stopped...  ~ f/u Mammogram 10/10 at [facility]= neg...  ~ saw Duwayne Heck 6/11- doing well, no changes made, f/u Prn.  ~ f/u Mammogram 10/11 at [facility] showed benign findings, no worrisome abn, f/u 27yr.  DEGENERATIVE JOINT DISEASE, GENERALIZED (ICD-715.00)  OSTEOPOROSIS (ICD-733.00)  - she has osteopenia on scans from [facility]- on FOSAMAX 70mg /wk, +Caltrate, +Vits w/ VitD.  ~ BMD 5/07 at [facility] w/ TScores -1.5 to -2.3.Marland KitchenMarland KitchenMarland Kitchen this was improved from 12/05 values...  ~ labs 9/09 showed Vit D leve3l = 32... rec- OTC Vit D supplement.  ~ BMD 10/09 at [facility] showed TScores -1.4 to -2.2 (sl better in Spine, sl worse in hip)- continue present meds.  ~ f/u BMD due 10/11> done at [facility] w/ TScores -0.9 in Spine (16% improved), & -1.8 in left FemNeck (6% worse).  ANXIETY DISORDER, GENERALIZED (ICD-300.02) - on ALPRAZOLAM 0.25mg  Prn... her daughter's husb has Melanoma & it is very stressful to the family.  Health Maintenance - she gets the yearly FLU vaccine; has PNEUMOVAX 9/09; ?last Tetanus vaccine...    Review of Systems Constitutional:   No  weight loss, night sweats,  Fevers, chills, fatigue, or  lassitude.  HEENT:   No headaches,  Difficulty swallowing,  Tooth/dental problems, or  Sore throat,                No sneezing, itching, ear ache, nasal congestion, post nasal drip,   CV:  No chest pain,  Orthopnea, PND, swelling in lower extremities, anasarca, dizziness, palpitations, syncope.   GI  No heartburn, indigestion, abdominal pain, nausea, vomiting, diarrhea, change in bowel habits, loss of appetite, bloody stools.   Resp: No shortness of breath with exertion or at rest.  No excess mucus, no productive cough,  No non-productive cough,  No coughing up of blood.  No change in color of mucus.  No wheezing.  No chest wall deformity  Skin: no rash or lesions.  GU: no dysuria, change in color of urine, no urgency or frequency.  No flank pain, no hematuria   MS:  No joint  Swelling.Painful range of motion of left leg/hip              Objective:   Physical Exam GEN: A/Ox3; pleasant , NAD, elderly in wheelchair  HEENT:  [place]/AT,  EACs-clear, TMs-wnl, NOSE-clear, THROAT-clear, no lesions, no postnasal drip or exudate noted.   NECK:  Supple w/ fair ROM; no JVD;  normal carotid impulses w/o bruits; no thyromegaly or nodules palpated; no lymphadenopathy.  RESP  Clear  P & A; w/o, wheezes/ rales/ or rhonchi.no accessory muscle use, no dullness to percussion  CARD:  RRR, no m/r/g  , no peripheral edema, pulses intact, no cyanosis or clubbing.  GI:   Soft & nt; nml bowel sounds; no organomegaly or masses detected.  Musco: Warm bil, tender along left lumbar sacral region and lateral left hip  Neg SLR, Pt is able to bear weight but says painful. Normal equal strength along lower extremities- 4/5 bilat.    Neuro: alert, no focal deficits  noted.    Skin: Warm, no lesions or rashes         Assessment & Plan:

## 2011-04-19 ENCOUNTER — Ambulatory Visit (INDEPENDENT_AMBULATORY_CARE_PROVIDER_SITE_OTHER): Payer: Medicare Other | Admitting: Pulmonary Disease

## 2011-04-19 ENCOUNTER — Other Ambulatory Visit (INDEPENDENT_AMBULATORY_CARE_PROVIDER_SITE_OTHER): Payer: Medicare Other

## 2011-04-19 DIAGNOSIS — I2510 Atherosclerotic heart disease of native coronary artery without angina pectoris: Secondary | ICD-10-CM

## 2011-04-19 DIAGNOSIS — E119 Type 2 diabetes mellitus without complications: Secondary | ICD-10-CM

## 2011-04-19 DIAGNOSIS — F411 Generalized anxiety disorder: Secondary | ICD-10-CM

## 2011-04-19 DIAGNOSIS — M810 Age-related osteoporosis without current pathological fracture: Secondary | ICD-10-CM

## 2011-04-19 DIAGNOSIS — I872 Venous insufficiency (chronic) (peripheral): Secondary | ICD-10-CM

## 2011-04-19 DIAGNOSIS — I10 Essential (primary) hypertension: Secondary | ICD-10-CM

## 2011-04-19 DIAGNOSIS — M159 Polyosteoarthritis, unspecified: Secondary | ICD-10-CM

## 2011-04-19 DIAGNOSIS — E7800 Pure hypercholesterolemia, unspecified: Secondary | ICD-10-CM

## 2011-04-19 DIAGNOSIS — Z23 Encounter for immunization: Secondary | ICD-10-CM

## 2011-04-19 LAB — BASIC METABOLIC PANEL
CO2: 32 mEq/L (ref 19–32)
Chloride: 99 mEq/L (ref 96–112)
Creatinine, Ser: 1.1 mg/dL (ref 0.4–1.2)
Potassium: 4 mEq/L (ref 3.5–5.1)

## 2011-04-19 NOTE — Patient Instructions (Signed)
Today we updated your med list in EPIC...  Today we did your follow up blood work...    Please call the [facility]REE in a few days for your results...    Dial [id number] & when prompted enter your patient number followed by the # symbol...    Your patient number is:  [id number]  Stay as active as possible, but BE SAFE- no falling allowed!  Call for any questions...  Let's plan a follow up visit in 6 months w/ FASTING labs at that time.Marland KitchenMarland Kitchen

## 2011-04-19 NOTE — Progress Notes (Signed)
Subjective:    Patient ID: Audrey Reed, female    DOB: May 02, 1918, 77 y.o.   MRN: [id number]  HPI 77 y/o WF here for a follow up visit... she has mult med problems as noted below...   ~  April 18, 2010:  32mo ROV- doing well overall but c/o some left hip area pain & we discussed checking XRay & trial MOBIC- if no better then Ortho eval (she notes hx bursitis in past)...  BP controlled on Labet/ Lisin/ Lasix;  denies CP/ palpit/ SOB/ ch in edema;  Chol & BS doing very well on her meds...  she saw [facility] 6/11- doing well, routine f/u... OK 2011 Flu vaccine today.  ~  October 17, 2010:  32mo ROV & improved overall> less hip pain & uses the Mobic Prn...    Hx HBP/ CAD/ VI>  BP controlled on meds= 142/84 today & denies CP, palpit, SOB, edema, etc...    Chol>  stable on Simva20 & FLP shows TChol 167, TG 78, HDL 79, LDL 73... continue same.    DM>  regulated w/ Metform500/d & BS= 135, A1c wasn't done...    Hx Breast Cancer>  off Femara, ?released by Duwayne Heck, Mammogram 10/11 was OK w/ f/u 27yr & monthly self exam recommended...     DJD/ Osteop>  Mobic helps, had BMD [facility] 10/11 showing TScores -0.9 in Spine (16% improved), & -1.8 in left FemNeck (6% worse);  on Fosamax + calcium/ mvi/ vit d...  ~  April 19, 2011:  32mo ROV & she reports some left side pain w/ radiation to her leg; she was eval by LMD in [place] w/ muscle spasm & inflamm but no better after Pred dosepak; therefore went to Chiropractor & she reports relief w/ his adjustments (DrBlake in [place])...    Hx HBP controlled on Labet, Lisin, Lasix; BP= 130/72 today & feeling well w/o HA, CP, palpit, SOB, edema;  Known CAD w/o angina & she takes ASA & has been walking for exercise... Chol looks good on Sima20, and DM control ok on Metformin (see prob list below)>          Problem List:  HYPERTENSION (ICD-401.9) - controlled on LABETOLOL 200mg Bid, LISINOPRIL 20mg /d, LASIX 40mg /d... BP= 130/72 here today & similar at home...  feeling well; denies HA, fatigue, visual changes, CP, palipit, dizziness, syncope, dyspnea, etc...  CORONARY ARTERY DISEASE (ICD-414.00) - on ASA 81mg /d... no CP, palpit, SOB, etc... exerc= yard, walking... ~  cath 2/91 by DrWeintraub showed 70% single vessel dis RCA & MVP...  VENOUS INSUFFICIENCY (ICD-459.81) - she has mod Ven insuffic w/ chr edema & dry skin dermatitis> knows to follow a low sodium diet, elevate legs, wear support hose when able "[name]", & takes the LASIX.  HYPERCHOLESTEROLEMIA (ICD-272.0) - on SIMVASTATIN 20mg /d... tolerating well, +diet... ~  FLP 5/08 showed TChol 162, TG 82, HDL 63, LDL 83... ~  FLP 3/09 showed TChol 179, TG 67, HDL 73, LDL 93... rec- same med. ~  FLP 3/10 showed TChol 163, TG 71, HDL 72, LDL 77 ~  FLP 2/11 showed TChol 182, TG 91, HDL 88, LDL 76... Continue same. ~  FLP 3/12 showed TChol 167, TG 78, HDL 79, LDL 73  DM (ICD-250.00) - on diet + METFORMIN 500mg /d added 3/09... weight=178# (down 6# in 32mo)...  ~  labs 5/08 showed BS=166, HgA1c=6.5 ~  labs 3/09 showed BS= 161 & Metformin 500mg /d started... ~  lab 9/09 showed BS= 111, HgA1c= 6.1.Marland KitchenMarland Kitchen  rec- continue same. ~  labs 3/10 showed BS= 149, A1c= 6.2 ~  labs 9/10 showed BS= 112, A1c= 5.8 ~  labs 2/11 showed BS= 115, A1c= 6.4 ~  Labs 3/12 showed BS= 135, A1c= not done...  DIVERTICULOSIS OF COLON (ICD-562.10) - last colonoscopy was 11/04 by DrDBrodie showing divertics & polyp (not retrieved)... she denies abd pain, D/ C/ blood etc...  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 surg by Lurline Hare, followed by Duwayne Heck and [facility] stopped 4/10 after 4.36yrs Rx... ~  saw Duwayne Heck 10/09- doing well, labs OK... f/u Mammogram was neg,  f/u BMD on Femara showed osteopenia w/ TScores -1.4 to -2.2 (sl better in Spine, sl worse in hip)- continue present meds. ~  saw DrIngram 12/09- doing well... ~  saw DrLivesay 4/10 & Femara  stopped... ~  f/u Mammogram 10/10 at [facility]= neg...  ~  saw Duwayne Heck 6/11- doing well, no changes made, f/u Prn. ~  f/u Mammogram 10/11 at [facility] showed benign findings, no worrisome abn, f/u 12yr.  DEGENERATIVE JOINT DISEASE, GENERALIZED (ICD-715.00) OSTEOPOROSIS (ICD-733.00) - she has osteopenia on scans from [facility]- on FOSAMAX 70mg /wk, +Caltrate, +Vits w/ VitD. ~  BMD 5/07 at [facility] w/ TScores -1.5 to -2.3.Marland KitchenMarland KitchenMarland Kitchen this was improved from 12/05 values... ~  labs 9/09 showed Vit D leve3l = 32... rec- OTC Vit D supplement. ~  BMD 10/09 at [facility] showed TScores -1.4 to -2.2 (sl better in Spine, sl worse in hip)- continue present meds. ~  f/u BMD due 10/11> done at [facility] w/ TScores -0.9 in Spine (16% improved), & -1.8 in left FemNeck (6% worse). ~  Labs 3/12 showed Vit D level = 39... rec OTC Vit D supplement ~2000u daily...  ANXIETY DISORDER, GENERALIZED (ICD-300.02) - on ALPRAZOLAM 0.25mg  Prn... her daughter's husb has Melanoma & it is very stressful to the family.  Health Maintenance - she gets the yearly FLU vaccine;  has PNEUMOVAX 9/09;  ?last Tetanus vaccine...   Past Surgical History  Procedure Date  . Cataract extraction   . Mastectomy     for breast cancer    Outpatient Encounter Prescriptions as of 04/19/2011  Medication Sig Dispense Refill  . alendronate (FOSAMAX) 70 MG tablet TAKE 1 TABLET EVERY WEEK  3 tablet  3  . ALPRAZolam (XANAX) 0.5 MG tablet take 1 tablet by mouth three times a day if needed for nerves  90 tablet  5  . furosemide (LASIX) 40 MG tablet TAKE 1 TABLET DAILY  90 tablet  3  . lisinopril (PRINIVIL,ZESTRIL) 20 MG tablet TAKE 1 TABLET DAILY  90 tablet  3  . metFORMIN (GLUCOPHAGE) 500 MG tablet TAKE 1 TABLET EVERY MORNING  90 tablet  3  . simvastatin (ZOCOR) 20 MG tablet Take 1 tablet by mouth At bedtime.      . predniSONE (DELTASONE) 20 MG tablet Take 40 mg by mouth daily.          Allergies  Allergen Reactions  . Codeine     REACTION: nausea,  swelling, rash  . Nitrofurantoin     REACTION: unsure of reaction--happened long ago  . Tramadol Nausea Only    Current Medications, Allergies, Past Medical History, Past Surgical History, Family History, and Social History were reviewed in [employer] record.    Review of Systems        See HPI - all other systems neg except as noted...  The patient complains of dyspnea on exertion  and difficulty walking.  The patient denies anorexia, fever, weight loss, weight gain, vision loss, decreased hearing, hoarseness, chest pain, syncope, peripheral edema, prolonged cough, headaches, hemoptysis, abdominal pain, melena, hematochezia, severe indigestion/heartburn, hematuria, incontinence, muscle weakness, suspicious skin lesions, transient blindness, depression, unusual weight change, abnormal bleeding, enlarged lymph nodes, and angioedema.     Objective:   Physical Exam    WD, WN, 77 y/o WF in NAD... GENERAL:  Alert & oriented; pleasant & cooperative... HEENT:  [place]/AT, EOM-wnl, PERRLA, EACs-clear, TMs-wnl, NOSE-clear, THROAT-clear & wnl. NECK:  Supple w/ fairROM; no JVD; normal carotid impulses w/o bruits; no thyromegaly or nodules palpated; no lymphadenopathy. CHEST:  Clear to P & A; without wheezes/ rales/ or rhonchi heard... HEART:  Regular Rhythm; without murmurs/ rubs/ or gallops detected... ABDOMEN:  Soft & nontender; mild panniculus; normal bowel sounds; no organomegaly or masses palpated... EXT: without deformities or arthritic changes; no varicose veins/ +venous insuffic/ 1+ edema. NEURO:  CN's intact;  no focal neuro deficits... DERM:  Dry skin dermatitis on LEs...   Assessment & Plan:   HBP>  Controlled on 3 med regimen; continue same + low sodium etc...  CAD>  On ASA daily, asymptomatic & encouraged to exercise regularly etc...  Ven Insuffic>  She has VI, chr edema & dry skin dermatitis, continue same RX...  CHOL>  On Simva20 + diet;  Continue  same...  DM>  On Metformin 500mg  bid + diet;  Continue same...  GI> hx Divertics & Polyp>  She remains asymptomatic & doing satis at 77 y/o...  Hx Breast Cancer>  Doing satis w/o recurrent disease...  DJD, Osteoporosis>  Doing satis on the Fosamax etc...  Anaxiety>  On Alprazolam as needed.Marland KitchenMarland Kitchen

## 2011-04-22 ENCOUNTER — Encounter: Payer: Self-pay | Admitting: Pulmonary Disease

## 2011-05-25 ENCOUNTER — Other Ambulatory Visit: Payer: Self-pay | Admitting: Pulmonary Disease

## 2011-06-28 ENCOUNTER — Encounter: Payer: Self-pay | Admitting: Pulmonary Disease

## 2011-07-14 ENCOUNTER — Other Ambulatory Visit: Payer: Self-pay | Admitting: Pulmonary Disease

## 2011-07-18 ENCOUNTER — Telehealth: Payer: Self-pay | Admitting: Pulmonary Disease

## 2011-07-18 MED ORDER — ALPRAZOLAM 0.5 MG PO TABS: ORAL_TABLET | ORAL | Status: DC

## 2011-07-18 NOTE — Telephone Encounter (Signed)
rx for the alprazolam has been called to the pharmacy---called and spoke with pt and she is aware of this med called to the pharmacy.

## 2011-07-18 NOTE — Telephone Encounter (Signed)
Please advise if okay to refill alprazolam- pt last seen by SN 04/19/11 and has ov pending with him for 10/17/11. Please advise, thanks!

## 2011-08-13 ENCOUNTER — Other Ambulatory Visit: Payer: Self-pay | Admitting: Pulmonary Disease

## 2011-08-22 DIAGNOSIS — M5430 Sciatica, unspecified side: Secondary | ICD-10-CM | POA: Diagnosis not present

## 2011-08-22 DIAGNOSIS — M999 Biomechanical lesion, unspecified: Secondary | ICD-10-CM | POA: Diagnosis not present

## 2011-09-08 ENCOUNTER — Other Ambulatory Visit: Payer: Self-pay | Admitting: Pulmonary Disease

## 2011-09-19 DIAGNOSIS — M5430 Sciatica, unspecified side: Secondary | ICD-10-CM | POA: Diagnosis not present

## 2011-09-19 DIAGNOSIS — M999 Biomechanical lesion, unspecified: Secondary | ICD-10-CM | POA: Diagnosis not present

## 2011-10-06 ENCOUNTER — Other Ambulatory Visit: Payer: Self-pay | Admitting: Pulmonary Disease

## 2011-10-16 DIAGNOSIS — M999 Biomechanical lesion, unspecified: Secondary | ICD-10-CM | POA: Diagnosis not present

## 2011-10-16 DIAGNOSIS — M5430 Sciatica, unspecified side: Secondary | ICD-10-CM | POA: Diagnosis not present

## 2011-10-17 ENCOUNTER — Encounter: Payer: Self-pay | Admitting: Pulmonary Disease

## 2011-10-17 ENCOUNTER — Ambulatory Visit (INDEPENDENT_AMBULATORY_CARE_PROVIDER_SITE_OTHER): Payer: Medicare Other | Admitting: Pulmonary Disease

## 2011-10-17 VITALS — BP 150/90 | HR 54 | Temp 97.0°F | Ht 61.0 in | Wt 164.6 lb

## 2011-10-17 DIAGNOSIS — I872 Venous insufficiency (chronic) (peripheral): Secondary | ICD-10-CM

## 2011-10-17 DIAGNOSIS — C50919 Malignant neoplasm of unspecified site of unspecified female breast: Secondary | ICD-10-CM

## 2011-10-17 DIAGNOSIS — I10 Essential (primary) hypertension: Secondary | ICD-10-CM

## 2011-10-17 DIAGNOSIS — F411 Generalized anxiety disorder: Secondary | ICD-10-CM

## 2011-10-17 DIAGNOSIS — E7800 Pure hypercholesterolemia, unspecified: Secondary | ICD-10-CM | POA: Diagnosis not present

## 2011-10-17 DIAGNOSIS — I2510 Atherosclerotic heart disease of native coronary artery without angina pectoris: Secondary | ICD-10-CM

## 2011-10-17 DIAGNOSIS — E119 Type 2 diabetes mellitus without complications: Secondary | ICD-10-CM

## 2011-10-17 DIAGNOSIS — M810 Age-related osteoporosis without current pathological fracture: Secondary | ICD-10-CM

## 2011-10-17 DIAGNOSIS — M159 Polyosteoarthritis, unspecified: Secondary | ICD-10-CM

## 2011-10-17 NOTE — Patient Instructions (Signed)
Today we updated your med list in our EPIC system...    Continue your current medications the same...  Let's plan another follow up visit in 6 months, and if poss make an early appt & come fasting for blood work that day.Marland KitchenMarland Kitchen

## 2011-10-17 NOTE — Progress Notes (Addendum)
Subjective:    Patient ID: Audrey Reed, female    DOB: 1917-08-19, 78 y.o.   MRN: [id number]  HPI 78 y/o WF here for a follow up visit... she has mult med problems as noted below...   ~  April 18, 2010:  47mo ROV- doing well overall but c/o some left hip area pain & we discussed checking XRay & trial MOBIC- if no better then Ortho eval (she notes hx bursitis in past)...  BP controlled on Labet/ Lisin/ Lasix;  denies CP/ palpit/ SOB/ ch in edema;  Chol & BS doing very well on her meds...  she saw [facility] 6/11- doing well, routine f/u... OK 2011 Flu vaccine today.  ~  October 17, 2010:  47mo ROV & improved overall> less hip pain & uses the Mobic Prn...    Hx HBP/ CAD/ VI>  BP controlled on meds= 142/84 today & denies CP, palpit, SOB, edema, etc...    Chol>  stable on Simva20 & FLP shows TChol 167, TG 78, HDL 79, LDL 73... continue same.    DM>  regulated w/ Metform500/d & BS= 135, A1c wasn't done...    Hx Breast Cancer>  off Femara, ?released by Duwayne Heck, Mammogram 10/11 was OK w/ f/u 55yr & monthly self exam recommended...     DJD/ Osteop>  Mobic helps, had BMD at [facility] 10/11 showing TScores -0.9 in Spine (16% improved), & -1.8 in left FemNeck (6% worse);  on Fosamax + calcium/ mvi/ vit d...  ~  April 19, 2011:  47mo ROV & she reports some left side pain w/ radiation to her leg; she was eval by LMD in [place] w/ muscle spasm & inflamm but no better after Pred dosepak; therefore went to Chiropractor & she reports relief w/ his adjustments (DrBlake in [place])...  Hx HBP controlled on Labet, Lisin, Lasix; BP= 130/72 today & feeling well w/o HA, CP, palpit, SOB, edema;  Known CAD w/o angina & she takes ASA & has been walking for exercise... Chol looks good on Sima20, and DM control ok on Metformin (see prob list below)>  ~  October 17, 2011:  47mo ROV & Tenee is remarkably stable at 19 and has no new complaints or concerns> BP is fairly well controlled on regimen & she is reminded to  take then regularly every dsay;  Lipids have been at goal on Simva20 + diet;  DM control similarly adeq on Metformin monotherapy;  She continues to see her chiropractor regularly & takes Fosamax for her osteroporosis;  She is not fasting today & she requested to wait until her next f/u visit to repeat fasting blood work...          Problem List:  HYPERTENSION (ICD-401.9) - controlled on COREG 12.5Bid + LABETOLOL 200mg Bid, LISINOPRIL 20mg /d, LASIX 40mg /d...  ~  9/12:  BP= 130/72 & similar at home> feeling well & denies HA, fatigue, visual changes, CP, palipit, dizziness, syncope, dyspnea, etc... ~  3/13:  BP= 158/92 & she is reminded to take meds every day; she remains asymptomatic w/o CP, palpit, SOB, edema, etc...  CORONARY ARTERY DISEASE (ICD-414.00) - on ASA 81mg /d... no CP, palpit, SOB, etc... exerc= yard, walking... ~  cath 2/91 by DrWeintraub showed 70% single vessel dis RCA & MVP...  VENOUS INSUFFICIENCY (ICD-459.81) - she has mod Ven Insuffic w/ chr edema & dry skin dermatitis> knows to follow a low sodium diet, elevate legs, wear support hose when able "[name]", & takes the LASIX daily.  HYPERCHOLESTEROLEMIA (ICD-272.0) - on SIMVASTATIN 20mg /d... tolerating well, +diet... ~  FLP 5/08 showed TChol 162, TG 82, HDL 63, LDL 83... ~  FLP 3/09 showed TChol 179, TG 67, HDL 73, LDL 93... rec- same med. ~  FLP 3/10 showed TChol 163, TG 71, HDL 72, LDL 77 ~  FLP 2/11 showed TChol 182, TG 91, HDL 88, LDL 76... Continue same. ~  FLP 3/12 showed TChol 167, TG 78, HDL 79, LDL 73 ~  3/13: she is not fasting for labs today & declines ret for FLP, prefers to wait til ROV in 23mo...  DM (ICD-250.00) - on diet + METFORMIN 500mg /d added 3/09... ~  labs 5/08 showed BS=166, HgA1c=6.5 ~  labs 3/09 showed BS= 161 & Metformin 500mg /d started... ~  lab 9/09 showed BS= 111, HgA1c= 6.1.Marland KitchenMarland Kitchen rec- continue same. ~  labs 3/10 showed BS= 149, A1c= 6.2 ~  labs 9/10 showed BS= 112, A1c= 5.8 ~  labs 2/11  showed BS= 115, A1c= 6.4 ~  Labs 3/12 showed BS= 135, A1c= not done... ~  3/13: she is not fasting for labs today & declines ret for FLP, prefers to wait til ROV in 23mo...  DIVERTICULOSIS OF COLON (ICD-562.10) - last colonoscopy was 11/04 by DrDBrodie showing divertics & polyp (not retrieved)... she denies abd pain, D/ C/ blood etc...  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 surg by Lurline Hare, followed by Duwayne Heck and [facility] stopped 4/10 after 4.35yrs Rx... ~  saw Duwayne Heck 10/09- doing well, labs OK... f/u Mammogram was neg,  f/u BMD on Femara showed osteopenia w/ TScores -1.4 to -2.2 (sl better in Spine, sl worse in hip)- continue present meds. ~  saw DrIngram 12/09- doing well... ~  saw DrLivesay 4/10 & Femara stopped... ~  f/u Mammogram 10/10 at [facility]= neg...  ~  saw Duwayne Heck 6/11- doing well, no changes made, f/u Prn. ~  f/u Mammogram 10/11 at [facility] showed benign findings, no worrisome abn, f/u 75yr. ~  f/u Mammogram 10/12 at [facility] is similar in appearance, stable, f/u 40yr...  DEGENERATIVE JOINT DISEASE, GENERALIZED (ICD-715.00) OSTEOPOROSIS (ICD-733.00) - she has osteopenia on scans from [facility]- on FOSAMAX 70mg /wk, +Caltrate, +Vits w/ VitD. ~  BMD 5/07 at [facility] w/ TScores -1.5 to -2.3.Marland KitchenMarland KitchenMarland Kitchen this was improved from 12/05 values... ~  labs 9/09 showed Vit D leve3l = 32... rec- OTC Vit D supplement. ~  BMD 10/09 at [facility] showed TScores -1.4 to -2.2 (sl better in Spine, sl worse in hip)- continue present meds. ~  f/u BMD due 10/11> done [facility] w/ TScores -0.9 in Spine (16% improved), & -1.8 in left FemNeck (6% worse). ~  Labs 3/12 showed Vit D level = 39... rec OTC Vit D supplement ~2000u daily...  ANXIETY DISORDER, GENERALIZED (ICD-300.02) - on ALPRAZOLAM 0.25mg  Prn... her daughter's husb has Melanoma & it is very stressful to the family.  Health Maintenance - she gets the yearly FLU vaccine;  has  PNEUMOVAX 9/09;  ?last Tetanus vaccine...   Past Surgical History  Procedure Date  . Cataract extraction   . Mastectomy     for breast cancer    Outpatient Encounter Prescriptions as of 10/17/2011  Medication Sig Dispense Refill  . alendronate (FOSAMAX) 70 MG tablet TAKE 1 TABLET EVERY WEEK  3 tablet  2  . ALPRAZolam (XANAX) 0.5 MG tablet Take one tablet by mouth three times daily as needed for nerves  90 tablet  5  . carvedilol (COREG)  12.5 MG tablet TAKE 1 TABLET TWICE A DAY  180 tablet  2  . furosemide (LASIX) 40 MG tablet TAKE 1 TABLET DAILY  90 tablet  3  . labetalol (NORMODYNE) 200 MG tablet Take 200 mg by mouth 2 (two) times daily.        Marland Kitchen lisinopril (PRINIVIL,ZESTRIL) 20 MG tablet TAKE 1 TABLET DAILY  90 tablet  3  . metFORMIN (GLUCOPHAGE) 500 MG tablet TAKE 1 TABLET EVERY MORNING  90 tablet  3  . simvastatin (ZOCOR) 20 MG tablet TAKE 1 TABLET AT BEDTIME  90 tablet  3    Allergies  Allergen Reactions  . Codeine     REACTION: nausea, swelling, rash  . Nitrofurantoin     REACTION: unsure of reaction--happened long ago  . Tramadol Nausea Only    Current Medications, Allergies, Past Medical History, Past Surgical History, Family History, and Social History were reviewed in [employer] record.    Review of Systems        See HPI - all other systems neg except as noted...  The patient complains of dyspnea on exertion and difficulty walking.  The patient denies anorexia, fever, weight loss, weight gain, vision loss, decreased hearing, hoarseness, chest pain, syncope, peripheral edema, prolonged cough, headaches, hemoptysis, abdominal pain, melena, hematochezia, severe indigestion/heartburn, hematuria, incontinence, muscle weakness, suspicious skin lesions, transient blindness, depression, unusual weight change, abnormal bleeding, enlarged lymph nodes, and angioedema.     Objective:   Physical Exam    WD, WN, 78 y/o WF in NAD... GENERAL:  Alert &  oriented; pleasant & cooperative... HEENT:  [place]/AT, EOM-wnl, PERRLA, EACs-clear, TMs-wnl, NOSE-clear, THROAT-clear & wnl. NECK:  Supple w/ fairROM; no JVD; normal carotid impulses w/o bruits; no thyromegaly or nodules palpated; no lymphadenopathy. CHEST:  Clear to P & A; without wheezes/ rales/ or rhonchi heard... HEART:  Regular Rhythm; without murmurs/ rubs/ or gallops detected... ABDOMEN:  Soft & nontender; mild panniculus; normal bowel sounds; no organomegaly or masses palpated... EXT: without deformities or arthritic changes; no varicose veins/ +venous insuffic/ 1+ edema. NEURO:  CN's intact;  no focal neuro deficits... DERM:  Dry skin dermatitis on LEs...   Assessment & Plan:   HBP>  On 4 med regimen; continue same + low sodium etc...  CAD>  On ASA daily, asymptomatic & encouraged to exercise regularly etc...  Ven Insuffic>  She has VI, chr edema & dry skin dermatitis, continue same RX...  CHOL>  On Simva20 + diet;  Continue same...  DM>  On Metformin 500mg /d + diet;  Continue same...  GI> hx Divertics & Polyp>  She remains asymptomatic & doing satis at 78 y/o...  Hx Breast Cancer>  Doing satis w/o recurrent disease...  DJD, Osteoporosis>  Doing satis on the Fosamax etc...  Anxiety>  On Alprazolam as needed.Marland KitchenMarland Kitchen

## 2011-10-29 ENCOUNTER — Other Ambulatory Visit: Payer: Self-pay | Admitting: Pulmonary Disease

## 2011-11-13 DIAGNOSIS — M5430 Sciatica, unspecified side: Secondary | ICD-10-CM | POA: Diagnosis not present

## 2011-11-13 DIAGNOSIS — M999 Biomechanical lesion, unspecified: Secondary | ICD-10-CM | POA: Diagnosis not present

## 2011-12-12 DIAGNOSIS — M999 Biomechanical lesion, unspecified: Secondary | ICD-10-CM | POA: Diagnosis not present

## 2011-12-12 DIAGNOSIS — M5430 Sciatica, unspecified side: Secondary | ICD-10-CM | POA: Diagnosis not present

## 2012-01-09 DIAGNOSIS — M999 Biomechanical lesion, unspecified: Secondary | ICD-10-CM | POA: Diagnosis not present

## 2012-01-09 DIAGNOSIS — M5430 Sciatica, unspecified side: Secondary | ICD-10-CM | POA: Diagnosis not present

## 2012-01-31 ENCOUNTER — Other Ambulatory Visit: Payer: Self-pay | Admitting: Pulmonary Disease

## 2012-02-01 ENCOUNTER — Other Ambulatory Visit: Payer: Self-pay | Admitting: Pulmonary Disease

## 2012-04-22 ENCOUNTER — Encounter: Payer: Self-pay | Admitting: Pulmonary Disease

## 2012-04-22 ENCOUNTER — Ambulatory Visit (INDEPENDENT_AMBULATORY_CARE_PROVIDER_SITE_OTHER): Payer: Medicare Other | Admitting: Pulmonary Disease

## 2012-04-22 ENCOUNTER — Other Ambulatory Visit (INDEPENDENT_AMBULATORY_CARE_PROVIDER_SITE_OTHER): Payer: Medicare Other

## 2012-04-22 VITALS — BP 180/82 | HR 65 | Temp 98.2°F | Ht 61.0 in | Wt 155.4 lb

## 2012-04-22 DIAGNOSIS — E7800 Pure hypercholesterolemia, unspecified: Secondary | ICD-10-CM | POA: Diagnosis not present

## 2012-04-22 DIAGNOSIS — I10 Essential (primary) hypertension: Secondary | ICD-10-CM | POA: Diagnosis not present

## 2012-04-22 DIAGNOSIS — M810 Age-related osteoporosis without current pathological fracture: Secondary | ICD-10-CM

## 2012-04-22 DIAGNOSIS — D126 Benign neoplasm of colon, unspecified: Secondary | ICD-10-CM

## 2012-04-22 DIAGNOSIS — K5730 Diverticulosis of large intestine without perforation or abscess without bleeding: Secondary | ICD-10-CM

## 2012-04-22 DIAGNOSIS — F411 Generalized anxiety disorder: Secondary | ICD-10-CM

## 2012-04-22 DIAGNOSIS — I2510 Atherosclerotic heart disease of native coronary artery without angina pectoris: Secondary | ICD-10-CM | POA: Diagnosis not present

## 2012-04-22 DIAGNOSIS — E119 Type 2 diabetes mellitus without complications: Secondary | ICD-10-CM | POA: Diagnosis not present

## 2012-04-22 DIAGNOSIS — Z23 Encounter for immunization: Secondary | ICD-10-CM | POA: Diagnosis not present

## 2012-04-22 DIAGNOSIS — I872 Venous insufficiency (chronic) (peripheral): Secondary | ICD-10-CM | POA: Diagnosis not present

## 2012-04-22 DIAGNOSIS — C50919 Malignant neoplasm of unspecified site of unspecified female breast: Secondary | ICD-10-CM

## 2012-04-22 DIAGNOSIS — M159 Polyosteoarthritis, unspecified: Secondary | ICD-10-CM

## 2012-04-22 LAB — CBC WITH DIFFERENTIAL/PLATELET
Basophils Relative: 0.5 % (ref 0.0–3.0)
Eosinophils Absolute: 0.4 10*3/uL (ref 0.0–0.7)
Eosinophils Relative: 3.8 % (ref 0.0–5.0)
Hemoglobin: 15.1 g/dL — ABNORMAL HIGH (ref 12.0–15.0)
Lymphocytes Relative: 21.8 % (ref 12.0–46.0)
MCHC: 33 g/dL (ref 30.0–36.0)
MCV: 89.4 fl (ref 78.0–100.0)
Neutro Abs: 6.8 10*3/uL (ref 1.4–7.7)
RBC: 5.14 Mil/uL — ABNORMAL HIGH (ref 3.87–5.11)
WBC: 10.3 10*3/uL (ref 4.5–10.5)

## 2012-04-22 LAB — BASIC METABOLIC PANEL
BUN: 33 mg/dL — ABNORMAL HIGH (ref 6–23)
Calcium: 10.5 mg/dL (ref 8.4–10.5)
Creatinine, Ser: 1.2 mg/dL (ref 0.4–1.2)

## 2012-04-22 LAB — LIPID PANEL
HDL: 80.3 mg/dL (ref >39.00)
LDL Cholesterol: 84 mg/dL (ref 0–99)
Total CHOL/HDL Ratio: 2
Triglycerides: 71 mg/dL (ref 0.0–149.0)
VLDL: 14.2 mg/dL (ref 0.0–40.0)

## 2012-04-22 LAB — HEPATIC FUNCTION PANEL
Bilirubin, Direct: 0.2 mg/dL (ref 0.0–0.3)
Total Bilirubin: 1 mg/dL (ref 0.3–1.2)

## 2012-04-22 LAB — TSH: TSH: 1.54 u[IU]/mL (ref 0.35–5.50)

## 2012-04-22 MED ORDER — ALENDRONATE SODIUM 70 MG PO TABS: 70.0000 mg | ORAL_TABLET | ORAL | Status: DC

## 2012-04-22 MED ORDER — LABETALOL HCL 200 MG PO TABS: ORAL_TABLET | ORAL | Status: DC

## 2012-04-22 MED ORDER — LISINOPRIL 40 MG PO TABS: 40.0000 mg | ORAL_TABLET | Freq: Every day | ORAL | Status: DC

## 2012-04-22 NOTE — Progress Notes (Signed)
Subjective:    Patient ID: Audrey Reed, female    DOB: [birth year], 78 y.o.   MRN: [id number]  HPI 78 y/o WF here for a follow up visit... she has mult med problems as noted below...   ~  April 18, 2010:  76mo ROV- doing well overall but c/o some left hip area pain & we discussed checking XRay & trial MOBIC- if no better then Ortho eval (she notes hx bursitis in past)...  BP controlled on Labet/ Lisin/ Lasix;  denies CP/ palpit/ SOB/ ch in edema;  Chol & BS doing very well on her meds...  she saw [facility] 6/11- doing well, routine f/u... OK 2011 Flu vaccine today.  ~  October 17, 2010:  76mo ROV & improved overall> less hip pain & uses the Mobic Prn...    Hx HBP/ CAD/ VI>  BP controlled on meds= 142/84 today & denies CP, palpit, SOB, edema, etc...    Chol>  stable on Simva20 & FLP shows TChol 167, TG 78, HDL 79, LDL 73... continue same.    DM>  regulated w/ Metform500/d & BS= 135, A1c wasn't done...    Hx Breast Cancer>  off Femara, ?released by Duwayne Heck, Mammogram 10/11 was OK w/ f/u 37yr & monthly self exam recommended...     DJD/ Osteop>  Mobic helps, had BMD at [facility] 10/11 showing TScores -0.9 in Spine (16% improved), & -1.8 in left FemNeck (6% worse);  on Fosamax + calcium/ mvi/ vit d...  ~  April 19, 2011:  76mo ROV & she reports some left side pain w/ radiation to her leg; she was eval by LMD in [place] w/ muscle spasm & inflamm but no better after Pred dosepak; therefore went to Chiropractor & she reports relief w/ his adjustments (DrBlake in [place])...  Hx HBP controlled on Labet, Lisin, Lasix; BP= 130/72 today & feeling well w/o HA, CP, palpit, SOB, edema;  Known CAD w/o angina & she takes ASA & has been walking for exercise... Chol looks good on Sima20, and DM control ok on Metformin (see prob list below)>  ~  October 17, 2011:  76mo ROV & Dealie is remarkably stable at 78 and has no new complaints or concerns> BP is fairly well controlled on regimen & she is reminded to  take then regularly every day;  Lipids have been at goal on Simva20 + diet;  DM control similarly adeq on Metformin monotherapy;  She continues to see her chiropractor regularly & takes Fosamax for her osteroporosis;  She is not fasting today & she requested to wait until her next f/u visit to repeat fasting blood work...  ~  April 22, 2012:  76mo ROV & Consandra feels well- no new complaints or concerns, but BP noted to be elev at 180/82 today, not checking BP at home, & claims she is taking her regimen regularly> Coreg12.5Bid, Labetolol200Bid, Lisinopril20, Lasix40> we decided to incr the Lisinopril to 40mg /d & plan short term f/u for further adjustments... She has a wound on left lat leg w/ escar & we discussed dressing changes & local care... Chol & DM are well regulated on meds below...    We reviewed prob list, meds, xrays and labs> see below >> OK Flu vaccine today... EKG 9/13 showed SBrady, rate47, RBBB, NSSTTWA... LABS 9/13:  FLP- at goals on Simva20;  Chems- ok on Metform500 w/ BS=149 A1c=5.9 Creat=1.2;  CBC- wnl;  TSH=1.54...           Problem  List:  HYPERTENSION (ICD-401.9) - controlled on COREG 12.5Bid + LABETOLOL 200mg Bid, LISINOPRIL 20mg /d, LASIX 40mg /d...  ~  9/12:  BP= 130/72 & similar at home> feeling well & denies HA, fatigue, visual changes, CP, palipit, dizziness, syncope, dyspnea, etc... ~  3/13:  BP= 158/92 & she is reminded to take meds every day; she remains asymptomatic w/o CP, palpit, SOB, edema, etc... ~  9/13:  BP= 180/82 & she is asymptomatic; we decided to incr the LISINOPRIL to 40mg /d...  CORONARY ARTERY DISEASE (ICD-414.00) - on ASA 81mg /d... no CP, palpit, SOB, etc... exerc= yard, walking... ~  cath 2/91 by DrWeintraub showed 70% single vessel dis RCA & MVP...  VENOUS INSUFFICIENCY (ICD-459.81) - she has mod Ven Insuffic w/ chr edema & dry skin dermatitis> knows to follow a low sodium diet, elevate legs, wear support hose when able "[name]", & takes  the LASIX daily.  HYPERCHOLESTEROLEMIA (ICD-272.0) - on SIMVASTATIN 20mg /d... tolerating well, +diet... ~  FLP 5/08 showed TChol 162, TG 82, HDL 63, LDL 83... ~  FLP 3/09 showed TChol 179, TG 67, HDL 73, LDL 93... rec- same med. ~  FLP 3/10 showed TChol 163, TG 71, HDL 72, LDL 77 ~  FLP 2/11 showed TChol 182, TG 91, HDL 88, LDL 76... Continue same. ~  FLP 3/12 showed TChol 167, TG 78, HDL 79, LDL 73 ~  3/13: she is not fasting for labs today & declines ret for FLP, prefers to wait til ROV in 76mo... ~  FLP 9/13 on Simva20 showed TChol 178, TG 71, HDL 80, LDL 84  DM (ICD-250.00) - on diet + METFORMIN 500mg /d added 3/09... ~  labs 5/08 showed BS=166, HgA1c=6.5 ~  labs 3/09 showed BS= 161 & Metformin 500mg /d started... ~  lab 9/09 showed BS= 111, HgA1c= 6.1.Marland KitchenMarland Kitchen rec- continue same. ~  labs 3/10 showed BS= 149, A1c= 6.2 ~  labs 9/10 showed BS= 112, A1c= 5.8 ~  labs 2/11 showed BS= 115, A1c= 6.4 ~  Labs 3/12 showed BS= 135, A1c= not done... ~  3/13: she is not fasting for labs today & declines ret for FLP, prefers to wait til ROV in 76mo... ~  Labs 9/13 on Metform500 showed BS=149, A1c=5.9  DIVERTICULOSIS OF COLON (ICD-562.10) - last colonoscopy was 11/04 by DrDBrodie showing divertics & polyp (not retrieved)... she denies abd pain, D/ C/ blood etc...  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 surg by Lurline Hare, followed by Duwayne Heck and [facility] stopped 4/10 after 4.52yrs Rx... ~  saw Duwayne Heck 10/09- doing well, labs OK... f/u Mammogram was neg,  f/u BMD on Femara showed osteopenia w/ TScores -1.4 to -2.2 (sl better in Spine, sl worse in hip)- continue present meds. ~  saw DrIngram 12/09- doing well... ~  saw DrLivesay 4/10 & Femara stopped... ~  f/u Mammogram 10/10 at [facility]= neg...  ~  saw Duwayne Heck 6/11- doing well, no changes made, f/u Prn. ~  f/u Mammogram 10/11 at [facility] showed benign findings, no worrisome abn, f/u  76yr. ~  f/u Mammogram 10/12 at [facility] is similar in appearance, stable, f/u 17yr...  DEGENERATIVE JOINT DISEASE, GENERALIZED (ICD-715.00) OSTEOPOROSIS (ICD-733.00) - she has osteopenia on scans from [facility]- on FOSAMAX 70mg /wk, +Caltrate, +Vits w/ VitD. ~  BMD 5/07 at [facility] w/ TScores -1.5 to -2.3.Marland KitchenMarland KitchenMarland Kitchen this was improved from 12/05 values... ~  labs 9/09 showed Vit D leve3l = 32... rec- OTC Vit D supplement. ~  BMD 10/09 at [facility]  showed TScores -1.4 to -2.2 (sl better in Spine, sl worse in hip)- continue present meds. ~  f/u BMD due 10/11> done at [facility] w/ TScores -0.9 in Spine (16% improved), & -1.8 in left FemNeck (6% worse). ~  Labs 3/12 showed Vit D level = 39... rec OTC Vit D supplement ~2000u daily...  ANXIETY DISORDER, GENERALIZED (ICD-300.02) - on ALPRAZOLAM 0.25mg  Prn... her daughter's husb has Melanoma & it is very stressful to the family.  Health Maintenance - she gets the yearly FLU vaccine;  has PNEUMOVAX 9/09;  ?last Tetanus vaccine...   Past Surgical History  Procedure Date  . Cataract extraction   . Mastectomy     for breast cancer    Outpatient Encounter Prescriptions as of 04/22/2012  Medication Sig Dispense Refill  . alendronate (FOSAMAX) 70 MG tablet Take 1 tablet (70 mg total) by mouth every 7 (seven) days. Take with a full glass of water on an empty stomach.  12 tablet  4  . ALPRAZolam (XANAX) 0.5 MG tablet take 1 tablet by mouth three times a day if needed for nerves  90 tablet  2  . carvedilol (COREG) 12.5 MG tablet TAKE 1 TABLET TWICE A DAY  180 tablet  1  . furosemide (LASIX) 40 MG tablet TAKE 1 TABLET DAILY  90 tablet  2  . labetalol (NORMODYNE) 200 MG tablet Take 200 mg by mouth 2 (two) times daily.        Marland Kitchen lisinopril (PRINIVIL,ZESTRIL) 20 MG tablet TAKE 1 TABLET DAILY  90 tablet  3  . metFORMIN (GLUCOPHAGE) 500 MG tablet TAKE 1 TABLET EVERY MORNING  90 tablet  3  . simvastatin (ZOCOR) 20 MG tablet TAKE 1 TABLET AT BEDTIME  90 tablet  3  .  DISCONTD: alendronate (FOSAMAX) 70 MG tablet TAKE 1 TABLET EVERY WEEK  3 tablet  2    Allergies  Allergen Reactions  . Codeine     REACTION: nausea, swelling, rash  . Nitrofurantoin     REACTION: unsure of reaction--happened long ago  . Tramadol Nausea Only    Current Medications, Allergies, Past Medical History, Past Surgical History, Family History, and Social History were reviewed in [employer] record.    Review of Systems        See HPI - all other systems neg except as noted...  The patient complains of dyspnea on exertion and difficulty walking.  The patient denies anorexia, fever, weight loss, weight gain, vision loss, decreased hearing, hoarseness, chest pain, syncope, peripheral edema, prolonged cough, headaches, hemoptysis, abdominal pain, melena, hematochezia, severe indigestion/heartburn, hematuria, incontinence, muscle weakness, suspicious skin lesions, transient blindness, depression, unusual weight change, abnormal bleeding, enlarged lymph nodes, and angioedema.     Objective:   Physical Exam    WD, WN, 78 y/o WF in NAD... GENERAL:  Alert & oriented; pleasant & cooperative... HEENT:  [place]/AT, EOM-wnl, PERRLA, EACs-clear, TMs-wnl, NOSE-clear, THROAT-clear & wnl. NECK:  Supple w/ fairROM; no JVD; normal carotid impulses w/o bruits; no thyromegaly or nodules palpated; no lymphadenopathy. CHEST:  Clear to P & A; without wheezes/ rales/ or rhonchi heard... HEART:  Regular Rhythm; without murmurs/ rubs/ or gallops detected... ABDOMEN:  Soft & nontender; mild panniculus; normal bowel sounds; no organomegaly or masses palpated... EXT: without deformities or arthritic changes; no varicose veins/ +venous insuffic/ 1+ edema. NEURO:  CN's intact;  no focal neuro deficits... DERM:  Dry skin dermatitis on LEs...   Assessment & Plan:    HBP>  On 4  med regimen but sysBP elev 180 today; decided to incr Lisinopril 1st (to 40mg /d); plan short term recheck &  further adjust in meds...  CAD>  On ASA daily, asymptomatic & encouraged to exercise regularly etc...  Ven Insuffic>  She has VI, chr edema & dry skin dermatitis, continue same RX...  CHOL>  On Simva20 + diet;  Continue same...  DM>  On Metformin 500mg /d + diet;  Continue same...  GI> hx Divertics & Polyp>  She remains asymptomatic & doing satis at 78 y/o...  Hx Breast Cancer>  Doing satis w/o recurrent disease...  DJD, Osteoporosis>  Doing satis on the Fosamax etc...  Anxiety>  On Alprazolam as needed...   Patient's Medications  New Prescriptions   No medications on file  Previous Medications   ALPRAZOLAM (XANAX) 0.5 MG TABLET    take 1 tablet by mouth three times a day if needed for nerves   CARVEDILOL (COREG) 12.5 MG TABLET    TAKE 1 TABLET TWICE A DAY   FUROSEMIDE (LASIX) 40 MG TABLET    TAKE 1 TABLET DAILY   METFORMIN (GLUCOPHAGE) 500 MG TABLET    TAKE 1 TABLET EVERY MORNING   SIMVASTATIN (ZOCOR) 20 MG TABLET    TAKE 1 TABLET AT BEDTIME  Modified Medications   Modified Medication Previous Medication   ALENDRONATE (FOSAMAX) 70 MG TABLET alendronate (FOSAMAX) 70 MG tablet      Take 1 tablet (70 mg total) by mouth every 7 (seven) days. Take with a full glass of water on an empty stomach.    TAKE 1 TABLET EVERY WEEK   LABETALOL (NORMODYNE) 200 MG TABLET labetalol (NORMODYNE) 200 MG tablet      Take 200 mg by mouth 2 (two) times daily. Take 2 tablets by mouth two times daily    Take 200 mg by mouth 2 (two) times daily.     LISINOPRIL (PRINIVIL,ZESTRIL) 40 MG TABLET lisinopril (PRINIVIL,ZESTRIL) 40 MG tablet      Take 1 tablet (40 mg total) by mouth daily.    Take 40 mg by mouth daily.  Discontinued Medications   LISINOPRIL (PRINIVIL,ZESTRIL) 20 MG TABLET    TAKE 1 TABLET DAILY

## 2012-04-22 NOTE — Patient Instructions (Addendum)
Today we updated your med list in our EPIC system...  For your Hypertension:    Increase your LISINOPRIL from 20mg  to 40mg /d...    Continue your other meds the same...    Remember> NO SALT in diet...  For your leg wound:    Soak in mild soapy water once or twice daily...    Gently rub w/ clean washcloth...    Pat dry, then cover w/ small amt of Neosporin ointment...    Dress the wound w/ a no-stick Telfa pad & paper tape  Today we did your follow up EKG, CXR, & FASTING blood work...    We will call you w/ the results when avail...  Call for any questions...  Let's plan a short term follow up appt in about 6 weeks to recheck your BP.Marland KitchenMarland Kitchen

## 2012-04-29 DIAGNOSIS — M999 Biomechanical lesion, unspecified: Secondary | ICD-10-CM | POA: Diagnosis not present

## 2012-04-29 DIAGNOSIS — M5430 Sciatica, unspecified side: Secondary | ICD-10-CM | POA: Diagnosis not present

## 2012-05-27 DIAGNOSIS — M999 Biomechanical lesion, unspecified: Secondary | ICD-10-CM | POA: Diagnosis not present

## 2012-05-27 DIAGNOSIS — M5430 Sciatica, unspecified side: Secondary | ICD-10-CM | POA: Diagnosis not present

## 2012-05-29 ENCOUNTER — Encounter: Payer: Self-pay | Admitting: *Deleted

## 2012-06-03 ENCOUNTER — Encounter: Payer: Self-pay | Admitting: Pulmonary Disease

## 2012-06-03 ENCOUNTER — Ambulatory Visit (INDEPENDENT_AMBULATORY_CARE_PROVIDER_SITE_OTHER): Payer: Medicare Other | Admitting: Pulmonary Disease

## 2012-06-03 VITALS — BP 150/84 | HR 64 | Temp 97.0°F | Ht 61.0 in | Wt 152.6 lb

## 2012-06-03 DIAGNOSIS — I10 Essential (primary) hypertension: Secondary | ICD-10-CM

## 2012-06-03 DIAGNOSIS — E7800 Pure hypercholesterolemia, unspecified: Secondary | ICD-10-CM | POA: Diagnosis not present

## 2012-06-03 DIAGNOSIS — I872 Venous insufficiency (chronic) (peripheral): Secondary | ICD-10-CM

## 2012-06-03 DIAGNOSIS — C50919 Malignant neoplasm of unspecified site of unspecified female breast: Secondary | ICD-10-CM

## 2012-06-03 DIAGNOSIS — F411 Generalized anxiety disorder: Secondary | ICD-10-CM

## 2012-06-03 DIAGNOSIS — E119 Type 2 diabetes mellitus without complications: Secondary | ICD-10-CM

## 2012-06-03 DIAGNOSIS — I2510 Atherosclerotic heart disease of native coronary artery without angina pectoris: Secondary | ICD-10-CM | POA: Diagnosis not present

## 2012-06-03 DIAGNOSIS — M159 Polyosteoarthritis, unspecified: Secondary | ICD-10-CM

## 2012-06-03 DIAGNOSIS — M810 Age-related osteoporosis without current pathological fracture: Secondary | ICD-10-CM

## 2012-06-03 MED ORDER — MINOXIDIL 10 MG PO TABS: 10.0000 mg | ORAL_TABLET | Freq: Every day | ORAL | Status: DC

## 2012-06-03 NOTE — Progress Notes (Signed)
Subjective:    Patient ID: Audrey Reed, female    DOB: [birth year], 78 y.o.   MRN: [id number]  HPI 78 y/o WF here for a follow up visit... she has mult med problems as noted below...   ~  April 18, 2010:  23mo ROV- doing well overall but c/o some left hip area pain & we discussed checking XRay & trial MOBIC- if no better then Ortho eval (she notes hx bursitis in past)...  BP controlled on Labet/ Lisin/ Lasix;  denies CP/ palpit/ SOB/ ch in edema;  Chol & BS doing very well on her meds...  she saw [facility] 6/11- doing well, routine f/u... OK 2011 Flu vaccine today.  ~  October 17, 2010:  23mo ROV & improved overall> less hip pain & uses the Mobic Prn...    Hx HBP/ CAD/ VI>  BP controlled on meds= 142/84 today & denies CP, palpit, SOB, edema, etc...    Chol>  stable on Simva20 & FLP shows TChol 167, TG 78, HDL 79, LDL 73... continue same.    DM>  regulated w/ Metform500/d & BS= 135, A1c wasn't done...    Hx Breast Cancer>  off Femara, ?released by Duway[facility] Heck, Mammogram 10/11 was OK w/ f/u 65yr & monthly self exam recommended...     DJD/ Osteop>  Mobic helps, had BMD at [facility] 10/11 showing TScores -0.9 in Spine (16% improved), & -1.8 in left FemNeck (6% worse);  on Fosamax + calcium/ mvi/ vit d...  ~  April 19, 2011:  23mo ROV & she reports some left side pain w/ radiation to her leg; she was eval by LMD in [place] w/ muscle spasm & inflamm but no better after Pred dosepak; therefore went to Chiropractor & she reports relief w/ his adjustments (DrBlake in [place])...  Hx HBP controlled on Labet, Lisin, Lasix; BP= 130/72 today & feeling well w/o HA, CP, palpit, SOB, edema;  Known CAD w/o angina & she takes ASA & has been walking for exercise... Chol looks good on Sima20, and DM control ok on Metformin (see prob list below)>  ~  October 17, 2011:  23mo ROV & Audrey Reed is remarkably stable at 38 and has no new complaints or concerns> BP is fairly well controlled on regimen & she is reminded to  take then regularly every day;  Lipids have been at goal on Simva20 + diet;  DM control similarly adeq on Metformin monotherapy;  She continues to see her chiropractor regularly & takes Fosamax for her osteroporosis;  She is not fasting today & she requested to wait until her next f/u visit to repeat fasting blood work...  ~  April 22, 2012:  23mo ROV & Audrey Reed feels well- no new complaints or concerns, but BP noted to be elev at 180/82 today, not checking BP at home, & claims she is taking her regimen regularly> Coreg12.5Bid, Labetolol200Bid, Lisinopril20, Lasix40> we decided to incr the Lisinopril to 40mg /d & plan short term f/u for further adjustments... She has a wound on left lat leg w/ escar & we discussed dressing changes & local care... Chol & DM are well regulated on meds below...    We reviewed prob list, meds, xrays and labs> see below >> OK Flu vaccine today... EKG 9/13 showed SBrady, rate47, RBBB, NSSTTWA... LABS 9/13:  FLP- at goals on Simva20;  Chems- ok on Metform500 w/ BS=149 A1c=5.9 Creat=1.2;  CBC- wnl;  TSH=1.54...  ~  June 03, 2012:  49mo ROV & follow  up BP after adjustment w/ incr Lisin20=>40/d + continue other 3 meds the same;  She brought all med bottles today but there is no Labetolol & call [facility] confirms not on this med but she is taking Coreg; BP today= 150/84 so we decided to try Minoxidil 10mg /d...  She will continue to monitor BP at home 7 f/u w/e in 2 months...    We reviewed prob list, meds, xrays and labs> see below for updates >>           Problem List:  HYPERTENSION (ICD-401.9) - controlled on COREG 12.5Bid + LABETOLOL 200mg Bid, LISINOPRIL 20mg /d, LASIX 40mg /d...  ~  9/12:  BP= 130/72 & similar at home> feeling well & denies HA, fatigue, visual changes, CP, palipit, dizziness, syncope, dyspnea, etc... ~  3/13:  BP= 158/92 & she is reminded to take meds every day; she remains asymptomatic w/o CP, palpit, SOB, edema, etc... ~  9/13:   BP= 180/82 & she is asymptomatic; we decided to incr the LISINOPRIL to 40mg /d...  CORONARY ARTERY DISEASE (ICD-414.00) - on ASA 81mg /d... no CP, palpit, SOB, etc... exerc= yard, walking... ~  cath 2/91 by DrWeintraub showed 70% single vessel dis RCA & MVP...  VENOUS INSUFFICIENCY (ICD-459.81) - she has mod Ven Insuffic w/ chr edema & dry skin dermatitis> knows to follow a low sodium diet, elevate legs, wear support hose when able "[name]", & takes the LASIX daily.  HYPERCHOLESTEROLEMIA (ICD-272.0) - on SIMVASTATIN 20mg /d... tolerating well, +diet... ~  FLP 5/08 showed TChol 162, TG 82, HDL 63, LDL 83... ~  FLP 3/09 showed TChol 179, TG 67, HDL 73, LDL 93... rec- same med. ~  FLP 3/10 showed TChol 163, TG 71, HDL 72, LDL 77 ~  FLP 2/11 showed TChol 182, TG 91, HDL 88, LDL 76... Continue same. ~  FLP 3/12 showed TChol 167, TG 78, HDL 79, LDL 73 ~  3/13: she is not fasting for labs today & declines ret for FLP, prefers to wait til ROV in 72mo... ~  FLP 9/13 on Simva20 showed TChol 178, TG 71, HDL 80, LDL 84  DM (ICD-250.00) - on diet + METFORMIN 500mg /d added 3/09... ~  labs 5/08 showed BS=166, HgA1c=6.5 ~  labs 3/09 showed BS= 161 & Metformin 500mg /d started... ~  lab 9/09 showed BS= 111, HgA1c= 6.1.Marland KitchenMarland Kitchen rec- continue same. ~  labs 3/10 showed BS= 149, A1c= 6.2 ~  labs 9/10 showed BS= 112, A1c= 5.8 ~  labs 2/11 showed BS= 115, A1c= 6.4 ~  Labs 3/12 showed BS= 135, A1c= not done... ~  3/13: she is not fasting for labs today & declines ret for FLP, prefers to wait til ROV in 72mo... ~  Labs 9/13 on Metform500 showed BS=149, A1c=5.9  DIVERTICULOSIS OF COLON (ICD-562.10) - last colonoscopy was 11/04 by DrDBrodie showing divertics & polyp (not retrieved)... she denies abd pain, D/ C/ blood etc...  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 surg by Lurline Hare, followed by Duwayne Heck and [facility] stopped 4/10 after 4.4yrs  Rx... ~  saw Duwayne Heck 10/09- doing well, labs OK... f/u Mammogram was neg,  f/u BMD on Femara showed osteopenia w/ TScores -1.4 to -2.2 (sl better in Spine, sl worse in hip)- continue present meds. ~  saw DrIngram 12/09- doing well... ~  saw DrLivesay 4/10 & Femara stopped... ~  f/u Mammogram 10/10 at [facility]= neg...  ~  saw Duwayne Heck 6/11- doing well,  no changes made, f/u Prn. ~  f/u Mammogram 10/11 at [facility] showed benign findings, no worrisome abn, f/u 58yr. ~  f/u Mammogram 10/12 at [facility] is similar in appearance, stable, f/u 58yr...  DEGENERATIVE JOINT DISEASE, GENERALIZED (ICD-715.00) OSTEOPOROSIS (ICD-733.00) - she has osteopenia on scans from [facility]- on FOSAMAX 70mg /wk, +Caltrate, +Vits w/ VitD. ~  BMD 5/07 at [facility] w/ TScores -1.5 to -2.3.Marland KitchenMarland KitchenMarland Kitchen this was improved from 12/05 values... ~  labs 9/09 showed Vit D leve3l = 32... rec- OTC Vit D supplement. ~  BMD 10/09 at [facility] showed TScores -1.4 to -2.2 (sl better in Spine, sl worse in hip)- continue present meds. ~  f/u BMD due 10/11> done at [facility] w/ TScores -0.9 in Spine (16% improved), & -1.8 in left FemNeck (6% worse); continue Alendronate. ~  Labs 3/12 showed Vit D level = 39... rec OTC Vit D supplement ~2000u daily...  ANXIETY DISORDER, GENERALIZED (ICD-300.02) - on ALPRAZOLAM 0.25mg  Prn... her daughter's husb has Melanoma & it is very stressful to the family.  Health Maintenance - she gets the yearly FLU vaccine;  has PNEUMOVAX 9/09;  ?last Tetanus vaccine...   Past Surgical History  Procedure Date  . Cataract extraction   . Mastectomy     for breast cancer    Outpatient Encounter Prescriptions as of 06/03/2012  Medication Sig Dispense Refill  . alendronate (FOSAMAX) 70 MG tablet Take 1 tablet (70 mg total) by mouth every 7 (seven) days. Take with a full glass of water on an empty stomach.  12 tablet  4  . ALPRAZolam (XANAX) 0.5 MG tablet take 1 tablet by mouth three times a day if needed for nerves  90 tablet   2  . calcium carbonate (CALCIUM 600) 600 MG TABS Take 600 mg by mouth 2 (two) times daily with a meal.      . carvedilol (COREG) 12.5 MG tablet TAKE 1 TABLET TWICE A DAY  180 tablet  1  . Cholecalciferol (VITAMIN D3) 1000 UNITS CAPS Take 2 capsules by mouth daily.      . furosemide (LASIX) 40 MG tablet TAKE 1 TABLET DAILY  90 tablet  2  . lisinopril (PRINIVIL,ZESTRIL) 40 MG tablet Take 1 tablet (40 mg total) by mouth daily.  90 tablet  3  . metFORMIN (GLUCOPHAGE) 500 MG tablet TAKE 1 TABLET EVERY MORNING  90 tablet  3  . Multiple Vitamins-Minerals (CENTRUM SILVER ADULT 50+ PO) Take 1 tablet by mouth daily.      . simvastatin (ZOCOR) 20 MG tablet TAKE 1 TABLET AT BEDTIME  90 tablet  3  . labetalol (NORMODYNE) 200 MG tablet Take 2 tablets by mouth two times daily        Allergies  Allergen Reactions  . Codeine     REACTION: nausea, swelling, rash  . Nitrofurantoin     REACTION: unsure of reaction--happened long ago  . Tramadol Nausea Only    Current Medications, Allergies, Past Medical History, Past Surgical History, Family History, and Social History were reviewed in [employer] record.    Review of Systems        See HPI - all other systems neg except as noted...  The patient complains of dyspnea on exertion and difficulty walking.  The patient denies anorexia, fever, weight loss, weight gain, vision loss, decreased hearing, hoarseness, chest pain, syncope, peripheral edema, prolonged cough, headaches, hemoptysis, abdominal pain, melena, hematochezia, severe indigestion/heartburn, hematuria, incontinence, muscle weakness, suspicious skin lesions, transient blindness, depression, unusual weight change,  abnormal bleeding, enlarged lymph nodes, and angioedema.     Objective:   Physical Exam    WD, WN, 78 y/o WF in NAD... GENERAL:  Alert & oriented; pleasant & cooperative... HEENT:  [place]/AT, EOM-wnl, PERRLA, EACs-clear, TMs-wnl, NOSE-clear, THROAT-clear &  wnl. NECK:  Supple w/ fairROM; no JVD; normal carotid impulses w/o bruits; no thyromegaly or nodules palpated; no lymphadenopathy. CHEST:  Clear to P & A; without wheezes/ rales/ or rhonchi heard... HEART:  Regular Rhythm; without murmurs/ rubs/ or gallops detected... ABDOMEN:  Soft & nontender; mild panniculus; normal bowel sounds; no organomegaly or masses palpated... EXT: without deformities or arthritic changes; no varicose veins/ +venous insuffic/ 1+ edema. NEURO:  CN's intact;  no focal neuro deficits... DERM:  Dry skin dermatitis on LEs...   Assessment & Plan:    HBP>  On 4 med regimen but doesn't have the Labetolol; we decided to leave this off & add Minoxidil, monitor BP...  CAD>  On ASA daily, asymptomatic & encouraged to exercise regularly etc...  Ven Insuffic>  She has VI, chr edema & dry skin dermatitis, continue same RX...  CHOL>  On Simva20 + diet;  Continue same...  DM>  On Metformin 500mg /d + diet;  Continue same...  GI> hx Divertics & Polyp>  She remains asymptomatic & doing satis at 79 y/o...  Hx Breast Cancer>  Doing satis w/o recurrent disease...  DJD, Osteoporosis>  Doing satis on the Fosamax etc...  Anxiety>  On Alprazolam as needed...   Patient's Medications  New Prescriptions   No medications on file  Previous Medications   ALENDRONATE (FOSAMAX) 70 MG TABLET    Take 1 tablet (70 mg total) by mouth every 7 (seven) days. Take with a full glass of water on an empty stomach.   ALPRAZOLAM (XANAX) 0.5 MG TABLET    take 1 tablet by mouth three times a day if needed for nerves   CALCIUM CARBONATE (CALCIUM 600) 600 MG TABS    Take 600 mg by mouth 2 (two) times daily with a meal.   CARVEDILOL (COREG) 12.5 MG TABLET    TAKE 1 TABLET TWICE A DAY   CHOLECALCIFEROL (VITAMIN D3) 1000 UNITS CAPS    Take 2 capsules by mouth daily.   FUROSEMIDE (LASIX) 40 MG TABLET    TAKE 1 TABLET DAILY   LABETALOL (NORMODYNE) 200 MG TABLET    Take 2 tablets by mouth two times daily    LISINOPRIL (PRINIVIL,ZESTRIL) 40 MG TABLET    Take 1 tablet (40 mg total) by mouth daily.   METFORMIN (GLUCOPHAGE) 500 MG TABLET    TAKE 1 TABLET EVERY MORNING   MULTIPLE VITAMINS-MINERALS (CENTRUM SILVER ADULT 50+ PO)    Take 1 tablet by mouth daily.   SIMVASTATIN (ZOCOR) 20 MG TABLET    TAKE 1 TABLET AT BEDTIME  Modified Medications   No medications on file  Discontinued Medications   No medications on file

## 2012-06-03 NOTE — Patient Instructions (Addendum)
Today we updated your med list in our EPIC system...    Continue your current medications the same...  Thanks for bringing all your med bottles to the office today>     We decided to continue the Carvedilol, Lisinopril & Furosemide as is>    And add an additional agent = MINOXIDIL (Loniten) 10mg  one tab daily...  Continue to monitor your BP at home & call for any questions...  Let's plan another short term visit in 2 months.Marland KitchenMarland Kitchen

## 2012-06-15 ENCOUNTER — Other Ambulatory Visit: Payer: Self-pay | Admitting: Pulmonary Disease

## 2012-06-24 DIAGNOSIS — M999 Biomechanical lesion, unspecified: Secondary | ICD-10-CM | POA: Diagnosis not present

## 2012-06-24 DIAGNOSIS — M5430 Sciatica, unspecified side: Secondary | ICD-10-CM | POA: Diagnosis not present

## 2012-07-01 ENCOUNTER — Ambulatory Visit (INDEPENDENT_AMBULATORY_CARE_PROVIDER_SITE_OTHER): Payer: Medicare Other | Admitting: Adult Health

## 2012-07-01 ENCOUNTER — Encounter: Payer: Self-pay | Admitting: Adult Health

## 2012-07-01 VITALS — BP 140/80 | HR 75 | Temp 97.0°F | Ht 61.0 in | Wt 163.2 lb

## 2012-07-01 DIAGNOSIS — R609 Edema, unspecified: Secondary | ICD-10-CM

## 2012-07-01 DIAGNOSIS — R0602 Shortness of breath: Secondary | ICD-10-CM

## 2012-07-01 DIAGNOSIS — I872 Venous insufficiency (chronic) (peripheral): Secondary | ICD-10-CM

## 2012-07-01 MED ORDER — MINOXIDIL 10 MG PO TABS: 10.0000 mg | ORAL_TABLET | Freq: Every day | ORAL | Status: DC

## 2012-07-01 NOTE — Assessment & Plan Note (Signed)
Worsening venous insufficiency with edema.  No apparent cellulitis on exam.  Advised on dietary management w/ low salt diet Check labs w/ BNP and BMET    Plan  Increase Lasix 40mg  2 tabs daily for 3 days then back to 1 daily .  May take extra 1/2 lasix if needed in addition to your 40mg  tablet for leg swelling  Low salt diet  Avoid NSAIDS - advil, aleve, ibuprofen , etc follow up Dr. Kriste Basque  As planned and As needed   I will call with labs  Please contact office for sooner follow up if symptoms do not improve or worsen or seek emergency care

## 2012-07-01 NOTE — Progress Notes (Signed)
Subjective:    Patient ID: Audrey Reed, female    DOB: 1918/03/27, 78 y.o.   MRN: [id number]  HPI 78 y/o WF   ~  April 18, 2010:  78mo ROV- doing well overall but c/o some left hip area pain & we discussed checking XRay & trial MOBIC- if no better then Ortho eval (she notes hx bursitis in past)...  BP controlled on Labet/ Lisin/ Lasix;  denies CP/ palpit/ SOB/ ch in edema;  Chol & BS doing very well on her meds...  she saw [facility] 6/11- doing well, routine f/u... OK 2011 Flu vaccine today.  ~  October 17, 2010:  78mo ROV & improved overall> less hip pain & uses the Mobic Prn...    Hx HBP/ CAD/ VI>  BP controlled on meds= 142/84 today & denies CP, palpit, SOB, edema, etc...    Chol>  stable on Simva20 & FLP shows TChol 167, TG 78, HDL 79, LDL 73... continue same.    DM>  regulated w/ Metform500/d & BS= 135, A1c wasn't done...    Hx Breast Cancer>  off Femara, ?released by Duwayne Heck, Mammogram 10/11 was OK w/ f/u 28yr & monthly self exam recommended...     DJD/ Osteop>  Mobic helps, had BMD at [facility] 10/11 showing TScores -0.9 in Spine (16% improved), & -1.8 in left FemNeck (6% worse);  on Fosamax + calcium/ mvi/ vit d...  ~  April 19, 2011:  78mo ROV & she reports some left side pain w/ radiation to her leg; she was eval by LMD in [place] w/ muscle spasm & inflamm but no better after Pred dosepak; therefore went to Chiropractor & she reports relief w/ his adjustments (DrBlake in [place])...  Hx HBP controlled on Labet, Lisin, Lasix; BP= 130/72 today & feeling well w/o HA, CP, palpit, SOB, edema;  Known CAD w/o angina & she takes ASA & has been walking for exercise... Chol looks good on Sima20, and DM control ok on Metformin (see prob list below)>  ~  October 17, 2011:  78mo ROV & [name] is remarkably stable at 66 and has no new complaints or concerns> BP is fairly well controlled on regimen & she is reminded to take then regularly every day;  Lipids have been at goal on Simva20 + diet;   DM control similarly adeq on Metformin monotherapy;  She continues to see her chiropractor regularly & takes Fosamax for her osteroporosis;  She is not fasting today & she requested to wait until her next f/u visit to repeat fasting blood work...  ~  April 22, 2012:  78mo ROV & [name] feels well- no new complaints or concerns, but BP noted to be elev at 180/82 today, not checking BP at home, & claims she is taking her regimen regularly> Coreg12.5Bid, Labetolol200Bid, Lisinopril20, Lasix40> we decided to incr the Lisinopril to 40mg /d & plan short term f/u for further adjustments... She has a wound on left lat leg w/ escar & we discussed dressing changes & local care... Chol & DM are well regulated on meds below...    We reviewed prob list, meds, xrays and labs> see below >> OK Flu vaccine today... EKG 9/13 showed SBrady, rate47, RBBB, NSSTTWA... LABS 9/13:  FLP- at goals on Simva20;  Chems- ok on Metform500 w/ BS=149 A1c=5.9 Creat=1.2;  CBC- wnl;  TSH=1.54...  ~  June 03, 2012:  76mo ROV & follow up BP after adjustment w/ incr Lisin20=>40/d + continue other 3 meds the same;  She brought all med bottles today but there is no Labetolol & call [facility] confirms not on this med but she is taking Coreg; BP today= 150/84 so we decided to try Minoxidil 10mg /d...  She will continue to monitor BP at home 7 f/u w/e in 2 months...    We reviewed prob list, meds, xrays and labs> see below for updates >>   07/01/2012 Acute OV  Complains of increased  bilateral LE edema w/ weeping x4days.  at onset had some redness, hot to touch. Redness resolved after first day  Swelling worse at end of day.  TEDS stockings no help, seem worse.  No fever, calf pain, chest pain or dyspnea.  Last ov minoxidil added. Improved b/p control on this.  Taking Lasix 40mg  daily . No missed doses           Problem List:  HYPERTENSION (ICD-401.9) - controlled on COREG 12.5Bid + LABETOLOL 200mg Bid,  LISINOPRIL 20mg /d, LASIX 40mg /d...  ~  9/12:  BP= 130/72 & similar at home> feeling well & denies HA, fatigue, visual changes, CP, palipit, dizziness, syncope, dyspnea, etc... ~  3/13:  BP= 158/92 & she is reminded to take meds every day; she remains asymptomatic w/o CP, palpit, SOB, edema, etc... ~  9/13:  BP= 180/82 & she is asymptomatic; we decided to incr the LISINOPRIL to 40mg /d...  CORONARY ARTERY DISEASE (ICD-414.00) - on ASA 81mg /d... no CP, palpit, SOB, etc... exerc= yard, walking... ~  cath 2/91 by DrWeintraub showed 70% single vessel dis RCA & MVP...  VENOUS INSUFFICIENCY (ICD-459.81) - she has mod Ven Insuffic w/ chr edema & dry skin dermatitis> knows to follow a low sodium diet, elevate legs, wear support hose when able "[name]", & takes the LASIX daily.  HYPERCHOLESTEROLEMIA (ICD-272.0) - on SIMVASTATIN 20mg /d... tolerating well, +diet... ~  FLP 5/08 showed TChol 162, TG 82, HDL 63, LDL 83... ~  FLP 3/09 showed TChol 179, TG 67, HDL 73, LDL 93... rec- same med. ~  FLP 3/10 showed TChol 163, TG 71, HDL 72, LDL 77 ~  FLP 2/11 showed TChol 182, TG 91, HDL 88, LDL 76... Continue same. ~  FLP 3/12 showed TChol 167, TG 78, HDL 79, LDL 73 ~  3/13: she is not fasting for labs today & declines ret for FLP, prefers to wait til ROV in 44mo... ~  FLP 9/13 on Simva20 showed TChol 178, TG 71, HDL 80, LDL 84  DM (ICD-250.00) - on diet + METFORMIN 500mg /d added 3/09... ~  labs 5/08 showed BS=166, HgA1c=6.5 ~  labs 3/09 showed BS= 161 & Metformin 500mg /d started... ~  lab 9/09 showed BS= 111, HgA1c= 6.1.Marland KitchenMarland Kitchen rec- continue same. ~  labs 3/10 showed BS= 149, A1c= 6.2 ~  labs 9/10 showed BS= 112, A1c= 5.8 ~  labs 2/11 showed BS= 115, A1c= 6.4 ~  Labs 3/12 showed BS= 135, A1c= not done... ~  3/13: she is not fasting for labs today & declines ret for FLP, prefers to wait til ROV in 44mo... ~  Labs 9/13 on Metform500 showed BS=149, A1c=5.9  DIVERTICULOSIS OF COLON (ICD-562.10) - last  colonoscopy was 11/04 by DrDBrodie showing divertics & polyp (not retrieved)... she denies abd pain, D/ C/ blood etc...  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 surg by Lurline Hare, followed by Duwayne Heck and [facility] stopped 4/10 after 4.63yrs Rx... ~  saw Duwayne Heck 10/09- doing well, labs OK... f/u  Mammogram was neg,  f/u BMD on Femara showed osteopenia w/ TScores -1.4 to -2.2 (sl better in Spine, sl worse in hip)- continue present meds. ~  saw DrIngram 12/09- doing well... ~  saw Dr[facility]ivesay 4/10 & Femara stopped... ~  f/u Mammogram 10/10 at [facility]= neg...  ~  saw Duwayne Heck 6/11- doing well, no changes made, f/u Prn. ~  f/u Mammogram 10/11 at [facility] showed benign findings, no worrisome abn, f/u 68yr. ~  f/u Mammogram 10/12 at [facility] is similar in appearance, stable, f/u 83yr...  DEGENERATIVE JOINT DISEASE, GENERALIZED (ICD-715.00) OSTEOPOROSIS (ICD-733.00) - she has osteopenia on scans from [facility]- on FOSAMAX 70mg /wk, +Caltrate, +Vits w/ VitD. ~  BMD 5/07 at [facility] w/ TScores -1.5 to -2.3.Marland KitchenMarland KitchenMarland Kitchen this was improved from 12/05 values... ~  labs 9/09 showed Vit D leve3l = 32... rec- OTC Vit D supplement. ~  BMD 10/09 at [facility] showed TScores -1.4 to -2.2 (sl better in Spine, sl worse in hip)- continue present meds. ~  f/u BMD due 10/11> done at [facility] w/ TScores -0.9 in Spine (16% improved), & -1.8 in left FemNeck (6% worse); continue Alendronate. ~  Labs 3/12 showed Vit D level = 39... rec OTC Vit D supplement ~2000u daily...  ANXIETY DISORDER, GENERALIZED (ICD-300.02) - on ALPRAZOLAM 0.25mg  Prn... her daughter's husb has Melanoma & it is very stressful to the family.  Health Maintenance - she gets the yearly FLU vaccine;  has PNEUMOVAX 9/09;  ?last Tetanus vaccine...    Review of Systems Constitutional:   No  weight loss, night sweats,  Fevers, chills,  +fatigue, or  lassitude.  HEENT:   No headaches,  Difficulty  swallowing,  Tooth/dental problems, or  Sore throat,                No sneezing, itching, ear ache, nasal congestion, post nasal drip,   CV:  No chest pain,  Orthopnea, PND,  , anasarca, dizziness, palpitations, syncope.   GI  No heartburn, indigestion, abdominal pain, nausea, vomiting, diarrhea, change in bowel habits, loss of appetite, bloody stools.   Resp:   No coughing up of blood.  No change in color of mucus.  No wheezing.  No chest wall deformity  Skin: no rash or lesions.  GU: no dysuria, change in color of urine, no urgency or frequency.  No flank pain, no hematuria   MS:  No joint pain or swelling.  No decreased range of motion.  No back pain.  Psych:  No change in mood or affect. No depression or anxiety.  No memory loss.         Objective:   Physical Exam GEN: A/Ox3; pleasant , NAD, well nourished   HEENT:  [place]/AT,  EACs-clear, TMs-wnl, NOSE-clear, THROAT-clear, no lesions, no postnasal drip or exudate noted.   NECK:  Supple w/ fair ROM; no JVD; normal carotid impulses w/o bruits; no thyromegaly or nodules palpated; no lymphadenopathy.  RESP  Clear  P & A; w/o, wheezes/ rales/ or rhonchi.no accessory muscle use, no dullness to percussion  CARD:  RRR, no m/r/g  , 2+  peripheral edema, pulses intact, no cyanosis or clubbing. Venous insufficiency changes, no significant redness. Neg homans sign.   GI:   Soft & nt; nml bowel sounds; no organomegaly or masses detected.  Musco: Warm bil, no deformities or joint swelling noted.   Neuro: alert, no focal deficits noted.    Skin: Warm, no lesions or rashes         Assessment & Plan:

## 2012-07-01 NOTE — Patient Instructions (Addendum)
Increase Lasix 40mg  2 tabs daily for 3 days then back to 1 daily .  May take extra 1/2 lasix if needed in addition to your 40mg  tablet for leg swelling  Low salt diet  Avoid NSAIDS - advil, aleve, ibuprofen , etc follow up Dr. Kriste Basque  As planned and As needed   I will call with labs  Please contact office for sooner follow up if symptoms do not improve or worsen or seek emergency care

## 2012-07-03 ENCOUNTER — Telehealth: Payer: Self-pay | Admitting: Pulmonary Disease

## 2012-07-03 ENCOUNTER — Other Ambulatory Visit: Payer: Self-pay | Admitting: Pulmonary Disease

## 2012-07-03 NOTE — Telephone Encounter (Signed)
Will forward to leigh to look out for forms

## 2012-07-03 NOTE — Telephone Encounter (Signed)
Form has been placed in SN folder and will be faxed back once signed.  Nothing further is needed.

## 2012-07-15 ENCOUNTER — Other Ambulatory Visit: Payer: Self-pay | Admitting: Pulmonary Disease

## 2012-07-22 DIAGNOSIS — M999 Biomechanical lesion, unspecified: Secondary | ICD-10-CM | POA: Diagnosis not present

## 2012-07-22 DIAGNOSIS — M5430 Sciatica, unspecified side: Secondary | ICD-10-CM | POA: Diagnosis not present

## 2012-08-04 ENCOUNTER — Emergency Department (HOSPITAL_COMMUNITY): Payer: Medicare Other

## 2012-08-04 ENCOUNTER — Inpatient Hospital Stay (HOSPITAL_COMMUNITY)
Admission: EM | Admit: 2012-08-04 | Discharge: 2012-08-15 | DRG: 689 | Disposition: A | Discharge status: Skilled Nursing Facility | Payer: Medicare Other | Attending: Internal Medicine | Admitting: Internal Medicine

## 2012-08-04 ENCOUNTER — Encounter (HOSPITAL_COMMUNITY): Payer: Self-pay | Admitting: *Deleted

## 2012-08-04 DIAGNOSIS — Z79899 Other long term (current) drug therapy: Secondary | ICD-10-CM | POA: Diagnosis not present

## 2012-08-04 DIAGNOSIS — R531 Weakness: Secondary | ICD-10-CM

## 2012-08-04 DIAGNOSIS — J9600 Acute respiratory failure, unspecified whether with hypoxia or hypercapnia: Secondary | ICD-10-CM | POA: Diagnosis not present

## 2012-08-04 DIAGNOSIS — Z66 Do not resuscitate: Secondary | ICD-10-CM | POA: Diagnosis present

## 2012-08-04 DIAGNOSIS — R29810 Facial weakness: Secondary | ICD-10-CM | POA: Diagnosis not present

## 2012-08-04 DIAGNOSIS — E7800 Pure hypercholesterolemia, unspecified: Secondary | ICD-10-CM | POA: Diagnosis present

## 2012-08-04 DIAGNOSIS — F0390 Unspecified dementia without behavioral disturbance: Secondary | ICD-10-CM | POA: Diagnosis present

## 2012-08-04 DIAGNOSIS — R269 Unspecified abnormalities of gait and mobility: Secondary | ICD-10-CM | POA: Diagnosis present

## 2012-08-04 DIAGNOSIS — K5730 Diverticulosis of large intestine without perforation or abscess without bleeding: Secondary | ICD-10-CM | POA: Diagnosis not present

## 2012-08-04 DIAGNOSIS — I872 Venous insufficiency (chronic) (peripheral): Secondary | ICD-10-CM | POA: Diagnosis present

## 2012-08-04 DIAGNOSIS — I2510 Atherosclerotic heart disease of native coronary artery without angina pectoris: Secondary | ICD-10-CM | POA: Diagnosis not present

## 2012-08-04 DIAGNOSIS — G92 Toxic encephalopathy: Secondary | ICD-10-CM | POA: Diagnosis not present

## 2012-08-04 DIAGNOSIS — W19XXXA Unspecified fall, initial encounter: Secondary | ICD-10-CM

## 2012-08-04 DIAGNOSIS — G929 Unspecified toxic encephalopathy: Secondary | ICD-10-CM | POA: Diagnosis not present

## 2012-08-04 DIAGNOSIS — I10 Essential (primary) hypertension: Secondary | ICD-10-CM | POA: Diagnosis present

## 2012-08-04 DIAGNOSIS — R5381 Other malaise: Secondary | ICD-10-CM | POA: Diagnosis not present

## 2012-08-04 DIAGNOSIS — Z5189 Encounter for other specified aftercare: Secondary | ICD-10-CM | POA: Diagnosis not present

## 2012-08-04 DIAGNOSIS — R627 Adult failure to thrive: Secondary | ICD-10-CM | POA: Diagnosis not present

## 2012-08-04 DIAGNOSIS — J90 Pleural effusion, not elsewhere classified: Secondary | ICD-10-CM | POA: Diagnosis not present

## 2012-08-04 DIAGNOSIS — J690 Pneumonitis due to inhalation of food and vomit: Secondary | ICD-10-CM | POA: Diagnosis not present

## 2012-08-04 DIAGNOSIS — M810 Age-related osteoporosis without current pathological fracture: Secondary | ICD-10-CM

## 2012-08-04 DIAGNOSIS — A498 Other bacterial infections of unspecified site: Secondary | ICD-10-CM | POA: Diagnosis not present

## 2012-08-04 DIAGNOSIS — E876 Hypokalemia: Secondary | ICD-10-CM | POA: Diagnosis not present

## 2012-08-04 DIAGNOSIS — M159 Polyosteoarthritis, unspecified: Secondary | ICD-10-CM

## 2012-08-04 DIAGNOSIS — R0902 Hypoxemia: Secondary | ICD-10-CM | POA: Diagnosis not present

## 2012-08-04 DIAGNOSIS — J189 Pneumonia, unspecified organism: Secondary | ICD-10-CM | POA: Diagnosis not present

## 2012-08-04 DIAGNOSIS — J42 Unspecified chronic bronchitis: Secondary | ICD-10-CM | POA: Diagnosis not present

## 2012-08-04 DIAGNOSIS — N390 Urinary tract infection, site not specified: Secondary | ICD-10-CM | POA: Diagnosis not present

## 2012-08-04 DIAGNOSIS — D696 Thrombocytopenia, unspecified: Secondary | ICD-10-CM | POA: Diagnosis present

## 2012-08-04 DIAGNOSIS — M545 Low back pain: Secondary | ICD-10-CM

## 2012-08-04 DIAGNOSIS — Z87891 Personal history of nicotine dependence: Secondary | ICD-10-CM | POA: Diagnosis not present

## 2012-08-04 DIAGNOSIS — R1310 Dysphagia, unspecified: Secondary | ICD-10-CM | POA: Diagnosis not present

## 2012-08-04 DIAGNOSIS — D126 Benign neoplasm of colon, unspecified: Secondary | ICD-10-CM

## 2012-08-04 DIAGNOSIS — F411 Generalized anxiety disorder: Secondary | ICD-10-CM | POA: Diagnosis present

## 2012-08-04 DIAGNOSIS — M899 Disorder of bone, unspecified: Secondary | ICD-10-CM | POA: Diagnosis not present

## 2012-08-04 DIAGNOSIS — I6789 Other cerebrovascular disease: Secondary | ICD-10-CM | POA: Diagnosis not present

## 2012-08-04 DIAGNOSIS — R079 Chest pain, unspecified: Secondary | ICD-10-CM | POA: Diagnosis not present

## 2012-08-04 DIAGNOSIS — Z853 Personal history of malignant neoplasm of breast: Secondary | ICD-10-CM | POA: Diagnosis not present

## 2012-08-04 DIAGNOSIS — I959 Hypotension, unspecified: Secondary | ICD-10-CM | POA: Diagnosis present

## 2012-08-04 DIAGNOSIS — T68XXXA Hypothermia, initial encounter: Secondary | ICD-10-CM | POA: Diagnosis not present

## 2012-08-04 DIAGNOSIS — E871 Hypo-osmolality and hyponatremia: Secondary | ICD-10-CM | POA: Diagnosis not present

## 2012-08-04 DIAGNOSIS — E119 Type 2 diabetes mellitus without complications: Secondary | ICD-10-CM | POA: Diagnosis present

## 2012-08-04 DIAGNOSIS — R680 Hypothermia, not associated with low environmental temperature: Secondary | ICD-10-CM | POA: Diagnosis present

## 2012-08-04 DIAGNOSIS — M949 Disorder of cartilage, unspecified: Secondary | ICD-10-CM | POA: Diagnosis not present

## 2012-08-04 DIAGNOSIS — K117 Disturbances of salivary secretion: Secondary | ICD-10-CM

## 2012-08-04 DIAGNOSIS — M1990 Unspecified osteoarthritis, unspecified site: Secondary | ICD-10-CM | POA: Diagnosis present

## 2012-08-04 DIAGNOSIS — R609 Edema, unspecified: Secondary | ICD-10-CM | POA: Diagnosis present

## 2012-08-04 DIAGNOSIS — R197 Diarrhea, unspecified: Secondary | ICD-10-CM

## 2012-08-04 DIAGNOSIS — R001 Bradycardia, unspecified: Secondary | ICD-10-CM

## 2012-08-04 DIAGNOSIS — C50919 Malignant neoplasm of unspecified site of unspecified female breast: Secondary | ICD-10-CM

## 2012-08-04 DIAGNOSIS — D72829 Elevated white blood cell count, unspecified: Secondary | ICD-10-CM | POA: Diagnosis not present

## 2012-08-04 DIAGNOSIS — R4182 Altered mental status, unspecified: Secondary | ICD-10-CM | POA: Diagnosis not present

## 2012-08-04 DIAGNOSIS — R21 Rash and other nonspecific skin eruption: Secondary | ICD-10-CM | POA: Diagnosis not present

## 2012-08-04 DIAGNOSIS — K1370 Unspecified lesions of oral mucosa: Secondary | ICD-10-CM | POA: Diagnosis not present

## 2012-08-04 DIAGNOSIS — I498 Other specified cardiac arrhythmias: Secondary | ICD-10-CM | POA: Diagnosis not present

## 2012-08-04 DIAGNOSIS — J9819 Other pulmonary collapse: Secondary | ICD-10-CM | POA: Diagnosis not present

## 2012-08-04 LAB — COMPREHENSIVE METABOLIC PANEL
Alkaline Phosphatase: 42 U/L (ref 39–117)
BUN: 42 mg/dL — ABNORMAL HIGH (ref 6–23)
CO2: 23 mEq/L (ref 19–32)
Chloride: 105 mEq/L (ref 96–112)
Creatinine, Ser: 1.2 mg/dL — ABNORMAL HIGH (ref 0.50–1.10)
GFR calc non Af Amer: 38 mL/min — ABNORMAL LOW (ref >90)
Glucose, Bld: 123 mg/dL — ABNORMAL HIGH (ref 70–99)
Potassium: 3.3 mEq/L — ABNORMAL LOW (ref 3.5–5.1)
Total Bilirubin: 0.8 mg/dL (ref 0.3–1.2)

## 2012-08-04 LAB — URINALYSIS, ROUTINE W REFLEX MICROSCOPIC
Bilirubin Urine: NEGATIVE
Leukocytes, UA: NEGATIVE
Nitrite: POSITIVE — AB
Specific Gravity, Urine: 1.014 (ref 1.005–1.030)
Urobilinogen, UA: 0.2 mg/dL (ref 0.0–1.0)
pH: 5.5 (ref 5.0–8.0)

## 2012-08-04 LAB — CBC
MCV: 84.3 fL (ref 78.0–100.0)
Platelets: 64 10*3/uL — ABNORMAL LOW (ref 150–400)
RBC: 4.4 MIL/uL (ref 3.87–5.11)
RDW: 16.9 % — ABNORMAL HIGH (ref 11.5–15.5)
WBC: 9.8 10*3/uL (ref 4.0–10.5)

## 2012-08-04 LAB — CK: Total CK: 60 U/L (ref 7–177)

## 2012-08-04 LAB — GLUCOSE, CAPILLARY
Glucose-Capillary: 104 mg/dL — ABNORMAL HIGH (ref 70–99)
Glucose-Capillary: 112 mg/dL — ABNORMAL HIGH (ref 70–99)

## 2012-08-04 LAB — TROPONIN I
Troponin I: <0.3 ng/mL (ref <0.30)
Troponin I: <0.3 ng/mL (ref <0.30)

## 2012-08-04 LAB — PROTIME-INR: INR: 1.31 (ref 0.00–1.49)

## 2012-08-04 LAB — URINE MICROSCOPIC-ADD ON

## 2012-08-04 MED ORDER — DEXTROSE 5 % IV SOLN
1.0000 g | INTRAVENOUS | Status: DC
  Administered 2012-08-04 – 2012-08-07 (×4): 1 g via INTRAVENOUS
  Filled 2012-08-04 (×5): qty 10

## 2012-08-04 MED ORDER — ACETAMINOPHEN 650 MG RE SUPP: 650.0000 mg | Freq: Four times a day (QID) | RECTAL | Status: DC | PRN

## 2012-08-04 MED ORDER — ENOXAPARIN SODIUM 40 MG/0.4ML ~~LOC~~ SOLN
40.0000 mg | SUBCUTANEOUS | Status: DC
  Administered 2012-08-04 – 2012-08-05 (×2): 40 mg via SUBCUTANEOUS
  Filled 2012-08-04 (×3): qty 0.4

## 2012-08-04 MED ORDER — SODIUM CHLORIDE 0.9 % IV SOLN
INTRAVENOUS | Status: DC
  Administered 2012-08-04: 19:00:00 via INTRAVENOUS

## 2012-08-04 MED ORDER — ONDANSETRON HCL 4 MG PO TABS: 4.0000 mg | ORAL_TABLET | Freq: Four times a day (QID) | ORAL | Status: DC | PRN

## 2012-08-04 MED ORDER — SODIUM CHLORIDE 0.9 % IV BOLUS (SEPSIS)
500.0000 mL | Freq: Once | INTRAVENOUS | Status: AC
  Administered 2012-08-04: 500 mL via INTRAVENOUS

## 2012-08-04 MED ORDER — SODIUM CHLORIDE 0.9 % IV SOLN
INTRAVENOUS | Status: AC
  Administered 2012-08-04: 19:00:00 via INTRAVENOUS

## 2012-08-04 MED ORDER — ONDANSETRON HCL 4 MG/2ML IJ SOLN: 4.0000 mg | Freq: Four times a day (QID) | INTRAMUSCULAR | Status: DC | PRN

## 2012-08-04 MED ORDER — POTASSIUM CHLORIDE IN NACL 20-0.9 MEQ/L-% IV SOLN
INTRAVENOUS | Status: DC
  Administered 2012-08-04: 22:00:00 via INTRAVENOUS
  Administered 2012-08-05: 250 mL via INTRAVENOUS
  Filled 2012-08-04 (×2): qty 1000

## 2012-08-04 MED ORDER — DEXTROSE 5 % IV SOLN: 1.0000 g | INTRAVENOUS | Status: DC

## 2012-08-04 MED ORDER — SODIUM CHLORIDE 0.9 % IJ SOLN
3.0000 mL | Freq: Two times a day (BID) | INTRAMUSCULAR | Status: DC
  Administered 2012-08-04 – 2012-08-15 (×10): 3 mL via INTRAVENOUS

## 2012-08-04 MED ORDER — ACETAMINOPHEN 325 MG PO TABS: 650.0000 mg | ORAL_TABLET | Freq: Four times a day (QID) | ORAL | Status: DC | PRN

## 2012-08-04 MED ORDER — INSULIN ASPART 100 UNIT/ML ~~LOC~~ SOLN
0.0000 [IU] | SUBCUTANEOUS | Status: DC
  Administered 2012-08-05 (×2): 2 [IU] via SUBCUTANEOUS
  Administered 2012-08-06 (×3): 3 [IU] via SUBCUTANEOUS
  Administered 2012-08-07: 2 [IU] via SUBCUTANEOUS
  Administered 2012-08-07 (×2): 3 [IU] via SUBCUTANEOUS
  Administered 2012-08-08: 2 [IU] via SUBCUTANEOUS
  Administered 2012-08-08: 8 [IU] via SUBCUTANEOUS
  Administered 2012-08-08 – 2012-08-09 (×5): 2 [IU] via SUBCUTANEOUS
  Administered 2012-08-09 – 2012-08-10 (×2): 5 [IU] via SUBCUTANEOUS
  Administered 2012-08-10: 3 [IU] via SUBCUTANEOUS
  Administered 2012-08-10: 8 [IU] via SUBCUTANEOUS
  Administered 2012-08-11 (×4): 3 [IU] via SUBCUTANEOUS
  Administered 2012-08-12: 5 [IU] via SUBCUTANEOUS
  Administered 2012-08-12: 2 [IU] via SUBCUTANEOUS

## 2012-08-04 NOTE — ED Notes (Signed)
Family aware of improved temperature and neurostatus; pt. To be admitted to Telemetry; Dr. Benjamine Mola is to change order from SDU to Telemetry.

## 2012-08-04 NOTE — ED Notes (Signed)
Dr. Benjamine Mola called re: pt. Can go telemetry b/c pt. Off bear hugger (1700), along with improved neurological status.

## 2012-08-04 NOTE — ED Notes (Signed)
Last x3 weeks unsteady gait; last x 3 days progressive unsteady gait; pt. On xanax but did not take last night.  Last seen normal: 2030. Found by son ~ 0930. Lt. Pupil. > rt. Pupil. Grandson stated, "urine had an extreme foul odor to it."

## 2012-08-04 NOTE — ED Notes (Signed)
Called flow manager to tell her about bed change.

## 2012-08-04 NOTE — Progress Notes (Signed)
Patient transferred to unit with son and grandson at bedside. Patient is alert and oriented with some slurred speech. Will keep NPO due to failing bedside swallow evaluation in ED. Patient was given a bath and peri care performed due to having a foley which was also placed in the ED. Safety video view. Family made aware of visitation policy and given direct number to call to the unit to check up on their family member. IV fluids started and oral care provided. 12 Lead EKG obtained and uploaded per MD order and telemetry was placed. Will continue to monitor.   Wynonia Medero J. Lendell Caprice RN BSN

## 2012-08-04 NOTE — ED Notes (Signed)
Dr. Benjamine Mola called re: bed placement. Bear hugger removed ~ 1700; pt. Has improved alertness, spontaneous,  Knows year, facial recognition, knows she is at [facility]; denies pain; pt. Hearing better further distance.

## 2012-08-04 NOTE — ED Provider Notes (Signed)
History     CSN: [id number]  Arrival date & time 08/04/12  1044   First MD Initiated Contact with Patient 08/04/12 1057      Chief Complaint  Patient presents with  . Fall  . Altered Mental Status    (Consider location/radiation/quality/duration/timing/severity/associated sxs/prior treatment) Patient is a 78 y.o. female presenting with fall and altered mental status. The history is provided by the patient and the EMS personnel. The history is limited by the condition of the patient.  Fall Pertinent negatives include no fever, no abdominal pain, no vomiting and no headaches.  Altered Mental Status Pertinent negatives include no chest pain, no abdominal pain and no headaches.  pt from home via ems where pt was found on floor by family member this morning. By report pt at baseline lives alone, attends to her adls, was last heard from/normal yesterday. ems notes slurred speech, generalized weakness. cbg 106.  Pts speech very difficult to understand, pt unable to respond to hpi questions - level 5 caveat.     Past Medical History  Diagnosis Date  . HTN (hypertension)   . CAD (coronary artery disease)   . Venous insufficiency   . Hypercholesterolemia   . DM (diabetes mellitus)   . Diverticulosis of colon   . Colon polyp   . Adenocarcinoma of breast   . DJD (degenerative joint disease)   . Osteoporosis   . Anxiety disorder     Past Surgical History  Procedure Date  . Cataract extraction   . Mastectomy     for breast cancer    Family History  Problem Relation Age of Onset  . Heart disease Father   . Heart disease Mother   . Alzheimer's disease Sister   . Alzheimer's disease Sister   . Heart attack Sister   . Heart attack Sister     History  Substance Use Topics  . Smoking status: Former Smoker    Quit date: 08/08/1987  . Smokeless tobacco: Not on file  . Alcohol Use: No    OB History    Grav Para Term Preterm Abortions TAB SAB Ect Mult Living                    Review of Systems  Unable to perform ROS: Mental status change  Constitutional: Negative for fever.  Cardiovascular: Negative for chest pain.  Gastrointestinal: Negative for vomiting and abdominal pain.  Neurological: Negative for headaches.  Psychiatric/Behavioral: Positive for altered mental status.  level 5 caveat  Allergies  Codeine; Nitrofurantoin; and Tramadol  Home Medications   Current Outpatient Rx  Name  Route  Sig  Dispense  Refill  . ALENDRONATE SODIUM 70 MG PO TABS   Oral   Take 1 tablet (70 mg total) by mouth every 7 (seven) days. Take with a full glass of water on an empty stomach.   12 tablet   4   . ALPRAZOLAM 0.5 MG PO TABS      Take 1/2 to 1 tablet by mouth three times daily as needed for nerves   90 tablet   2   . CALCIUM CARBONATE 600 MG PO TABS   Oral   Take 600 mg by mouth 2 (two) times daily with a meal.         . CARVEDILOL 12.5 MG PO TABS      TAKE 1 TABLET TWICE A DAY   180 tablet   0   . VITAMIN D3 1000 UNITS PO CAPS  Oral   Take 2 capsules by mouth daily.         . FUROSEMIDE 40 MG PO TABS      TAKE 1 TABLET DAILY   90 tablet   1   . LISINOPRIL 40 MG PO TABS   Oral   Take 1 tablet (40 mg total) by mouth daily.   90 tablet   3   . METFORMIN HCL 500 MG PO TABS      TAKE 1 TABLET EVERY MORNING   90 tablet   3   . MINOXIDIL 10 MG PO TABS   Oral   Take 1 tablet (10 mg total) by mouth daily.   30 tablet   11   . CENTRUM SILVER ADULT 50+ PO   Oral   Take 1 tablet by mouth daily.         Marland Kitchen SIMVASTATIN 20 MG PO TABS      TAKE 1 TABLET AT BEDTIME   90 tablet   0     BP 181/76  Resp 18  SpO2 99%  Physical Exam  Nursing note and vitals reviewed. Constitutional: She appears well-developed and well-nourished. No distress.  HENT:  Head: Atraumatic.  Nose: Nose normal.  Mouth/Throat: Oropharynx is clear and moist.  Eyes: Conjunctivae normal are normal. Pupils are equal, round, and reactive to light.  No scleral icterus.  Neck: Neck supple. No tracheal deviation present.       No bruit. ccollar  Cardiovascular: Normal rate, regular rhythm, normal heart sounds and intact distal pulses.   Pulmonary/Chest: Effort normal and breath sounds normal. No respiratory distress. She exhibits no tenderness.  Abdominal: Soft. Normal appearance and bowel sounds are normal. She exhibits no distension and no mass. There is no tenderness. There is no rebound and no guarding.  Genitourinary:       No cva tenderness  Musculoskeletal: She exhibits no tenderness.       bil leg edema, symmetric, reported at pts baseline   Neurological: She is alert.       Alert, eyes open. Speech dysarthric. Equal grip, moves bil ext purposefully.   Skin: Skin is dry. No rash noted.  Psychiatric:       Slow to respond.     ED Course  Procedures (including critical care time)   Labs Reviewed  URINALYSIS, ROUTINE W REFLEX MICROSCOPIC  URINE CULTURE  COMPREHENSIVE METABOLIC PANEL  CBC  TROPONIN I  PROTIME-INR  CULTURE, BLOOD (ROUTINE X 2)  CULTURE, BLOOD (ROUTINE X 2)      MDM  Iv ns. Labs. Continuous pulse ox and monitor.   Initial temp very low. Temp foley placed. Bear hugger/blanket.  Cultures sent.   Date: 08/04/2012  Rate: 53  Rhythm: sinus bradycardia  QRS Axis: right  Intervals: PR prolonged  ST/T Wave abnormalities: nonspecific ST/T changes  Conduction Disutrbances:right bundle branch block and left posterior fascicular block  Narrative Interpretation:   Old EKG Reviewed: none available  Reviewed nursing notes and prior charts for additional history.           Suzi Roots, MD 08/06/12 [phone]

## 2012-08-04 NOTE — H&P (Signed)
[facility] History and Physical  Audrey Reed GNF:[id number] DOB: [birth year] DOA: 08/04/2012  Referring physician: er PCP: Michele Mcalpine, MD  Specialists:   Chief Complaint: found down  HPI: Audrey Reed is a 78 y.o. female  Who lives home alone and does her own ADL.  Family noticed that her gait was becoming more unsteady since before christmas.  sHe told family that she took one of her xanax and they attributed it to this.  At christmas dinner, she was confused and took some medications at the dinner table and became even more confused.  Her grandson stayed with her that night and noticed a very strong odor to her urine.  This AM about 9, her son went to check on her and found her on the floor inside of her house.  She was brought to the ER and was found to have a CBG of 106 and temperature of 89. She was placed on a bear hugger.  BP when I saw her was 100/60s.  Patient was still very altered- speech low and slurred.  Plan to admit to SDU for overnight observation and if patient improves, transfer to the floor tomm.      Review of Systems: unable to do full ROS secondary to lethargy  Past Medical History  Diagnosis Date  . HTN (hypertension)   . CAD (coronary artery disease)   . Venous insufficiency   . Hypercholesterolemia   . DM (diabetes mellitus)   . Diverticulosis of colon   . Colon polyp   . Adenocarcinoma of breast   . DJD (degenerative joint disease)   . Osteoporosis   . Anxiety disorder    Past Surgical History  Procedure Date  . Cataract extraction   . Mastectomy     for breast cancer   Social History:  reports that she quit smoking about 25 years ago. She does not have any smokeless tobacco history on file. She reports that she does not drink alcohol. Her drug history not on file.   Allergies  Allergen Reactions  . Nitrofurantoin     REACTION: unsure of reaction--happened long ago  . Tramadol Nausea Only  . Codeine Nausea And Vomiting, Swelling and  Rash    Family History  Problem Relation Age of Onset  . Heart disease Father   . Heart disease Mother   . Alzheimer's disease Sister   . Alzheimer's disease Sister   . Heart attack Sister   . Heart attack Sister     Prior to Admission medications   Medication Sig Start Date End Date Taking? Authorizing Provider  alendronate (FOSAMAX) 70 MG tablet Take 1 tablet (70 mg total) by mouth every 7 (seven) days. Take with a full glass of water on an empty stomach. 04/22/12   Michele Mcalpine, MD  ALPRAZolam Prudy Feeler) 0.5 MG tablet Take 1/2 to 1 tablet by mouth three times daily as needed for nerves 06/20/12   Michele Mcalpine, MD  calcium carbonate (CALCIUM 600) 600 MG TABS Take 600 mg by mouth 2 (two) times daily with a meal.    Historical Provider, MD  Cholecalciferol (VITAMIN D3) 1000 UNITS CAPS Take 2 capsules by mouth daily.    Historical Provider, MD  furosemide (LASIX) 40 MG tablet TAKE 1 TABLET DAILY 07/03/12   Michele Mcalpine, MD  lisinopril (PRINIVIL,ZESTRIL) 40 MG tablet Take 1 tablet (40 mg total) by mouth daily. 04/22/12   Michele Mcalpine, MD  minoxidil (LONITEN) 10 MG tablet Take 1  tablet (10 mg total) by mouth daily. 07/01/12   Tammy S Parrett, NP  Multiple Vitamins-Minerals (CENTRUM SILVER ADULT 50+ PO) Take 1 tablet by mouth daily.    Historical Provider, MD  simvastatin (ZOCOR) 20 MG tablet TAKE 1 TABLET AT BEDTIME 07/15/12   Michele Mcalpine, MD   Physical Exam: Filed Vitals:   08/04/12 1100 08/04/12 1115 08/04/12 1130 08/04/12 1300  BP: 189/55 150/50 147/42 121/40  Pulse: 51 47 50 48  TempSrc:      Resp: 14 12 13 12   SpO2: 100% 99% 100% 100%     General:  Slow to respond oriented to place but not person  Eyes: watery  ENT: mucous membranes dry  Neck: no JVD  Cardiovascular: rrr  Respiratory: clear anterior, no wheezing  Abdomen: +BS, soft, NT.ND  Skin: no rashes or lesions  Musculoskeletal: moves all 4 extremities but slowly, + edema B/L, redness on hand (left) at IV  site- does not seem infiltrated  Psychiatric: slow  Neurologic: no focal weakness  Labs on Admission:  Basic Metabolic Panel:  Lab 08/04/12 4098  NA 143  K 3.3*  CL 105  CO2 23  GLUCOSE 123*  BUN 42*  CREATININE 1.20*  CALCIUM 8.5  MG --  PHOS --   Liver Function Tests:  Lab 08/04/12 1118  AST 23  ALT 20  ALKPHOS 42  BILITOT 0.8  PROT 5.5*  ALBUMIN 3.0*   No results found for this basename: LIPASE:5,AMYLASE:5 in the last 168 hours No results found for this basename: AMMONIA:5 in the last 168 hours CBC:  Lab 08/04/12 1118  WBC 9.8  NEUTROABS --  HGB 12.7  HCT 37.1  MCV 84.3  PLT 64*   Cardiac Enzymes:  Lab 08/04/12 1511 08/04/12 1119  CKTOTAL 60 --  CKMB -- --  CKMBINDEX -- --  TROPONINI -- <0.30    BNP (last 3 results) No results found for this basename: PROBNP:3 in the last 8760 hours CBG: No results found for this basename: GLUCAP:5 in the last 168 hours  Radiological Exams on Admission: Ct Head Wo Contrast  08/04/2012  *RADIOLOGY REPORT*  Clinical Data:  The patient was found on the floor  CT HEAD WITHOUT CONTRAST CT CERVICAL SPINE WITHOUT CONTRAST  Technique:  Multidetector CT imaging of the head and cervical spine was performed following the standard protocol without intravenous contrast.  Multiplanar CT image reconstructions of the cervical spine were also generated.  Comparison:  08/04/2012  CT HEAD  Findings: There is diffuse patchy low density throughout the subcortical and periventricular white matter consistent with chronic small vessel ischemic change.  There is prominence of the sulci and ventricles consistent with brain atrophy.  There is no evidence for acute brain infarct, hemorrhage or mass.  Retention cyst versus polyp noted in the right maxillary sinus. The remaining paranasal sinuses are clear.  The mastoid air cells are clear the skull appears intact.  IMPRESSION:  1.  Small vessel ischemic change and brain atrophy. 2.  No acute  intracranial abnormalities.  CT CERVICAL SPINE  Findings: Normal alignment of the cervical spine.  The vertebral body heights and disc spaces are well preserved.  Bones are osteopenic.  There is no fracture or subluxation identified. Scarring noted within the anterior right apex.  Carotid artery calcifications are noted bilaterally.  IMPRESSION:  1.  No acute findings. 2.  Carotid artery calcifications.   Original Report Authenticated By: Signa Kell, M.D.    Ct Cervical Spine Wo Contrast  08/04/2012  *RADIOLOGY REPORT*  Clinical Data:  The patient was found on the floor  CT HEAD WITHOUT CONTRAST CT CERVICAL SPINE WITHOUT CONTRAST  Technique:  Multidetector CT imaging of the head and cervical spine was performed following the standard protocol without intravenous contrast.  Multiplanar CT image reconstructions of the cervical spine were also generated.  Comparison:  08/04/2012  CT HEAD  Findings: There is diffuse patchy low density throughout the subcortical and periventricular white matter consistent with chronic small vessel ischemic change.  There is prominence of the sulci and ventricles consistent with brain atrophy.  There is no evidence for acute brain infarct, hemorrhage or mass.  Retention cyst versus polyp noted in the right maxillary sinus. The remaining paranasal sinuses are clear.  The mastoid air cells are clear the skull appears intact.  IMPRESSION:  1.  Small vessel ischemic change and brain atrophy. 2.  No acute intracranial abnormalities.  CT CERVICAL SPINE  Findings: Normal alignment of the cervical spine.  The vertebral body heights and disc spaces are well preserved.  Bones are osteopenic.  There is no fracture or subluxation identified. Scarring noted within the anterior right apex.  Carotid artery calcifications are noted bilaterally.  IMPRESSION:  1.  No acute findings. 2.  Carotid artery calcifications.   Original Report Authenticated By: Signa Kell, M.D.    Dg Chest Port 1  View  08/04/2012  *RADIOLOGY REPORT*  Clinical Data: Short of breath, chest pain  PORTABLE CHEST - 1 VIEW  Comparison: Chest radiograph 06/09/2004  Findings: Normal mediastinum and heart silhouette.  There is chronic bronchitic markings centrally.  There is a chronic interstitial pattern which is increased compared to prior.  No focal consolidation.  No pneumothorax.  IMPRESSION: Chronic interstitial pattern is increased compared to prior exam from 2005.   Original Report Authenticated By: Genevive Bi, M.D.     EKG: Independently reviewed. Not done  Assessment/Plan Principal Problem:  *UTI (lower urinary tract infection) Active Problems:  DM  ANXIETY DISORDER, GENERALIZED  Hypothermia  Dementia  Altered mental state  Thrombocytopenia   1. UTI- culture ordered, rocephin given, BC x 2 ordered 2. Hypothermia- TSH, bear hugger ordered, ?sepsis etiology 3. DM- SSI 4. Dementia- ?baseline- will prob need SNF at d/c 5. AMS due to UTI- plan to r/o CVA with MRI- CT scan of head was normal 6. Thrombocytopenia- ?from sepsis, continue to monitor     Code Status: full Family Communication: son/grandson at bedside Disposition Plan: SNF  Time spent: >70 min  Marlin Canary [facility] Pager [phone]  If 7PM-7AM, please contact night-coverage [email or web address] Password [email or web address] 08/04/2012, 4:08 PM

## 2012-08-04 NOTE — ED Notes (Signed)
Patient lives alone,  Baseline is independent and alert and oriented.  Patient found in floor today by her son,  Garbled speech with right sided facial droop.  Patient is maintaining her own airway. Patient arrives fully immobilized.  Patient cbg 106.  bp 180/90.  Patient brady on monitor with rate of 50.

## 2012-08-04 NOTE — ED Notes (Signed)
Temperature: 89.24 F - NS (warm) 500 cc bolus and bear hugger on at 38 C

## 2012-08-05 ENCOUNTER — Ambulatory Visit: Payer: Medicare Other | Admitting: Pulmonary Disease

## 2012-08-05 ENCOUNTER — Inpatient Hospital Stay (HOSPITAL_COMMUNITY): Payer: Medicare Other

## 2012-08-05 DIAGNOSIS — I498 Other specified cardiac arrhythmias: Secondary | ICD-10-CM

## 2012-08-05 DIAGNOSIS — N390 Urinary tract infection, site not specified: Principal | ICD-10-CM

## 2012-08-05 LAB — BASIC METABOLIC PANEL
BUN: 36 mg/dL — ABNORMAL HIGH (ref 6–23)
Calcium: 9.6 mg/dL (ref 8.4–10.5)
Creatinine, Ser: 1.18 mg/dL — ABNORMAL HIGH (ref 0.50–1.10)
GFR calc Af Amer: 45 mL/min — ABNORMAL LOW (ref >90)
GFR calc non Af Amer: 39 mL/min — ABNORMAL LOW (ref >90)

## 2012-08-05 LAB — CBC
HCT: 43.6 % (ref 36.0–46.0)
MCHC: 33.5 g/dL (ref 30.0–36.0)
RDW: 17.5 % — ABNORMAL HIGH (ref 11.5–15.5)

## 2012-08-05 LAB — TSH: TSH: 3.79 u[IU]/mL (ref 0.350–4.500)

## 2012-08-05 LAB — GLUCOSE, CAPILLARY: Glucose-Capillary: 122 mg/dL — ABNORMAL HIGH (ref 70–99)

## 2012-08-05 NOTE — Progress Notes (Signed)
Patient seen and examined by me.  Plan to continue abx until cultures are back.  Failed swallow, SLP eval pending.  Overall patient has improved.  Marlin Canary DO

## 2012-08-05 NOTE — Evaluation (Signed)
Occupational Therapy Evaluation Patient Details Name: Audrey Reed MRN: [id number] DOB: July 23, 1918 Today's Date: 08/05/2012 Time: 4098-1191 OT Time Calculation (min): 24 min  OT Assessment / Plan / Recommendation Clinical Impression  Pt is a cooperative 78yr old female admitted with UTI and subsequent confusion.  Pt overall presents at a min assist level for mobility and mod to max assist for simulated selfcare tasks.  Pt will benefit from acute OT services to help increase independence with basic tasks.  Feel she will need 24 hour supervison at discharge and SNF for follow-up.      OT Assessment  Patient needs continued OT Services    Follow Up Recommendations  SNF    Barriers to Discharge Decreased caregiver support    Equipment Recommendations  None recommended by OT       Frequency  Min 2X/week    Precautions / Restrictions Precautions Precautions: Fall Restrictions Weight Bearing Restrictions: No   Pertinent Vitals/Pain O2 sats 91% on room air    ADL  Eating/Feeding: Simulated;Set up Where Assessed - Eating/Feeding: Chair Grooming: Simulated;Set up Where Assessed - Grooming: Supported sitting Upper Body Bathing: Simulated;Set up Where Assessed - Upper Body Bathing: Unsupported sitting Lower Body Bathing: Simulated;Moderate assistance Where Assessed - Lower Body Bathing: Supported sit to stand Upper Body Dressing: Simulated;Minimal assistance Where Assessed - Upper Body Dressing: Unsupported sitting Lower Body Dressing: Simulated;Maximal assistance Where Assessed - Lower Body Dressing: Supported sit to stand Toilet Transfer: Simulated;Minimal assistance Toilet Transfer Method: Stand pivot;Other (comment) (with use of RW) Acupuncturist: Bedside commode Toileting - Clothing Manipulation and Hygiene: Simulated;Minimal assistance Where Assessed - Engineer, mining and Hygiene: Sit to stand from 3-in-1 or toilet Tub/Shower Transfer Method:  Not assessed Transfers/Ambulation Related to ADLs: Pt overall min assist with max instructional cueing for simple ambulation to the sink and back to the bedside chair. ADL Comments: Pt with increased confusion during session.  Unable to identify her son initially in the room but eventually was able to tell the therapist his name.  Decreased ability to reach either foot for dressing tasks when attempting to donn her socks.    OT Diagnosis: Generalized weakness;Acute pain;Cognitive deficits  OT Problem List: Decreased strength;Decreased safety awareness;Decreased cognition;Impaired balance (sitting and/or standing);Pain OT Treatment Interventions: Self-care/ADL training;Therapeutic activities;DME and/or AE instruction;Balance training;Patient/family education;Cognitive remediation/compensation   OT Goals Acute Rehab OT Goals OT Goal Formulation: With patient Time For Goal Achievement: 08/19/12 Potential to Achieve Goals: Good ADL Goals Pt Will Perform Grooming: with supervision;Standing at sink;Unsupported ADL Goal: Grooming - Progress: Goal set today Pt Will Perform Lower Body Bathing: with min assist;Sit to stand from chair ADL Goal: Lower Body Bathing - Progress: Goal set today Pt Will Transfer to Toilet: with supervision;with DME;3-in-1 ADL Goal: Toilet Transfer - Progress: Goal set today Pt Will Perform Toileting - Clothing Manipulation: with supervision;Sitting on 3-in-1 or toilet;Standing ADL Goal: Toileting - Clothing Manipulation - Progress: Goal set today Pt Will Perform Toileting - Hygiene: with supervision;Sit to stand from 3-in-1/toilet ADL Goal: Toileting - Hygiene - Progress: Goal set today  Visit Information  Last OT Received On: 08/05/12 Assistance Needed: +1    Subjective Data  Subjective: I don't know who he is.   (pt referring to her son in the room) Patient Stated Goal: Pt did not state   Prior Functioning     Home Living Lives With: Alone Type of Home:  House Home Access: Stairs to enter [employer] of Steps: 1 Entrance Stairs-Rails: None Home Layout:  One level Bathroom Shower/Tub: Engineer, manufacturing systems: Handicapped height Bathroom Accessibility: Yes Home Adaptive Equipment: Walker - four wheeled;Quad cane Additional Comments: son present and able to provide prior level of function Prior Function Level of Independence: Independent Driving: Yes Vocation: Retired Comments: Lived alone.         Vision/Perception Vision - Assessment Vision Assessment: Vision not tested Perception Perception: Within Functional Limits Praxis Praxis: Intact   Cognition  Overall Cognitive Status: Impaired Area of Impairment: Attention;Memory;Awareness of deficits Arousal/Alertness: Awake/alert Orientation Level: Disoriented to;Place;Time;Situation Behavior During Session: Flat affect Current Attention Level: Focused Following Commands: Follows one step commands inconsistently Problem Solving: Needs max instructional cueing to sequence the walker.   Cognition - Other Comments: Unable to correctly identify her son that was in the room.    Extremity/Trunk Assessment Right Upper Extremity Assessment RUE ROM/Strength/Tone: Within functional levels RUE Sensation: WFL - Light Touch RUE Coordination: Deficits Left Upper Extremity Assessment LUE ROM/Strength/Tone: Within functional levels LUE Sensation: WFL - Light Touch LUE Coordination: Deficits LUE Coordination Deficits: Pt with arthritic changes to her digits affecting coordination. Right Lower Extremity Assessment RLE ROM/Strength/Tone: Deficits RLE ROM/Strength/Tone Deficits: functionally 3+/5 Left Lower Extremity Assessment LLE ROM/Strength/Tone: Deficits LLE ROM/Strength/Tone Deficits: functionally 3+/5 Trunk Assessment Trunk Assessment: Normal     Mobility Bed Mobility Bed Mobility: Supine to Sit;Sitting - Scoot to Edge of Bed Supine to Sit: 2: Max  assist Sitting - Scoot to [place] of Bed: 2: Max assist Details for Bed Mobility Assistance: Pt with difficulty processing. Transfers Transfers: Sit to Stand Sit to Stand: With upper extremity assist;4: Min assist;With armrests;From chair/3-in-1 Stand to Sit: 4: Min assist;Without upper extremity assist Details for Transfer Assistance: Had pt place hands on walker to cue her to stand.           Balance Static Standing Balance Static Standing - Balance Support: Right upper extremity supported;Left upper extremity supported Static Standing - Level of Assistance: 4: Min assist   End of Session OT - End of Session Equipment Utilized During Treatment: Gait belt Activity Tolerance: Patient tolerated treatment well Patient left: in chair;with call bell/phone within reach Nurse Communication: Mobility status     Aaliyah Cancro OTR/L Pager number [phone] 08/05/2012, 12:32 PM

## 2012-08-05 NOTE — Progress Notes (Signed)
  Echocardiogram 2D Echocardiogram has been performed.  Audrey Reed 08/05/2012, 3:11 PM

## 2012-08-05 NOTE — Evaluation (Signed)
Clinical/Bedside Swallow Evaluation Patient Details  Name: KETARA CAVNESS MRN: [id number] Date of Birth: [birth year]  Today's Date: 08/05/2012 Time: 4098-1191 SLP Time Calculation (min): 11 min  Past Medical History:  Past Medical History  Diagnosis Date  . HTN (hypertension)   . CAD (coronary artery disease)   . Venous insufficiency   . Hypercholesterolemia   . DM (diabetes mellitus)   . Diverticulosis of colon   . Colon polyp   . Adenocarcinoma of breast   . DJD (degenerative joint disease)   . Osteoporosis   . Anxiety disorder    Past Surgical History:  Past Surgical History  Procedure Date  . Cataract extraction   . Mastectomy     for breast cancer   HPI:  NOELLA KIPNIS is a 78 y.o. female who lives home alone and does her own ADL. Family noticed that her gait was becoming more unsteady since before christmas. sHe told family that she took one of her xanax and they attributed it to this. At christmas dinner, she was confused and took some medications at the dinner table and became even more confused. Her grandson stayed with her that night and noticed a very strong odor to her urine. This AM about 9, her son went to check on her and found her on the floor inside of her house. She was brought to the ER and was found to have a CBG of 106 and temperature of 89. She was placed on a bear hugger. Pt wasadministered the RN stroke swallow screen in ED, which she failed.    Assessment / Plan / Recommendation Clinical Impression  Pt presents with functional consumption of thin liquids with no overt signs of decreased airway protection or aspiration. Pt has altered mental status and was unwilling to attempt solid foods. Her grandson reports no difficulty eating or drinking prior to admission. At this time recommend pt initate a dys 2 (fine chop) diet with thin liquids and full supervision. SLP will f/u x1 to observe pts tolerance of solids.     Aspiration Risk  Mild    Diet  Recommendation Dysphagia 2 (Fine chop);Thin liquid   Liquid Administration via: Cup;Straw Medication Administration: Whole meds with liquid Supervision: Patient able to self feed;Full supervision/cueing for compensatory strategies Compensations: Slow rate;Small sips/bites Postural Changes and/or Swallow Maneuvers: Seated upright 90 degrees    Other  Recommendations Oral Care Recommendations: Oral care BID   Follow Up Recommendations  Skilled Nursing facility    Frequency and Duration min 2x/week  2 weeks   Pertinent Vitals/Pain NA    SLP Swallow Goals Patient will consume recommended diet without observed clinical signs of aspiration with: Moderate assistance Patient will utilize recommended strategies during swallow to increase swallowing safety with: Minimal cueing   Swallow Study Prior Functional Status  Type of Home: House Lives With: Alone Vocation: Retired    General HPI: HARLOWE DOWLER is a 78 y.o. female who lives home alone and does her own ADL. Family noticed that her gait was becoming more unsteady since before christmas. sHe told family that she took one of her xanax and they attributed it to this. At christmas dinner, she was confused and took some medications at the dinner table and became even more confused. Her grandson stayed with her that night and noticed a very strong odor to her urine. This AM about 9, her son went to check on her and found her on the floor inside of her house. She was brought  to the ER and was found to have a CBG of 106 and temperature of 89. She was placed on a bear hugger. Pt wasadministered the RN stroke swallow screen in ED, which she failed.  Type of Study: Bedside swallow evaluation Diet Prior to this Study: NPO Temperature Spikes Noted: No Respiratory Status: Supplemental O2 delivered via (comment) History of Recent Intubation: No Behavior/Cognition: Alert;Confused Oral Cavity - Dentition: Dentures, top;Dentures, bottom Self-Feeding  Abilities: Needs assist Patient Positioning: Upright in chair Baseline Vocal Quality: Clear Volitional Cough: Cognitively unable to elicit Volitional Swallow: Able to elicit    Oral/Motor/Sensory Function Overall Oral Motor/Sensory Function: Other (comment) (Pt would not follow commands)   Ice Chips     Thin Liquid Thin Liquid: Within functional limits Presentation: Cup;Straw    Nectar Thick Nectar Thick Liquid: Not tested   Honey Thick Honey Thick Liquid: Not tested   Puree Puree: Within functional limits   Solid   GO    Solid: Not tested (Pt refused)      Harlon Ditty, MA CCC-SLP [phone]  Buell Parcel, Riley Nearing 08/05/2012,2:30 PM

## 2012-08-05 NOTE — Evaluation (Signed)
Physical Therapy Evaluation Patient Details Name: Audrey Reed MRN: [id number] DOB: 12-Feb-1918 Today's Date: 08/05/2012 Time: 4098-1191 PT Time Calculation (min): 17 min  PT Assessment / Plan / Recommendation Clinical Impression  Pt adm with confusion and UTI.  Pt currently with mobility deficits.  Should improve as infection improves.  Lives alone and recommend ST-SNF.    PT Assessment  Patient needs continued PT services    Follow Up Recommendations  SNF    Does the patient have the potential to tolerate intense rehabilitation      Barriers to Discharge Decreased caregiver support      Equipment Recommendations  None recommended by PT    Recommendations for Other Services     Frequency Min 3X/week    Precautions / Restrictions Precautions Precautions: Fall   Pertinent Vitals/Pain Denies pain      Mobility  Bed Mobility Bed Mobility: Supine to Sit;Sitting - Scoot to Edge of Bed Supine to Sit: 2: Max assist Sitting - Scoot to [place] of Bed: 2: Max assist Details for Bed Mobility Assistance: Pt with difficulty processing. Transfers Transfers: Sit to Stand;Stand to Sit Sit to Stand: 4: Min assist;With upper extremity assist;From bed Stand to Sit: 4: Min assist;With upper extremity assist;With armrests;To bed;To chair/3-in-1 Details for Transfer Assistance: Had pt place hands on walker to cue her to stand. Ambulation/Gait Ambulation/Gait Assistance: 4: Min assist Ambulation Distance (Feet): 2 Feet Assistive device: Rolling walker Ambulation/Gait Assistance Details: verbal/tactile cues to initiate and for completion of task. Gait Pattern: Step-to pattern;Decreased step length - right;Decreased step length - left;Shuffle;Trunk flexed Gait velocity: decr    Shoulder Instructions     Exercises     PT Diagnosis: Generalized weakness;Altered mental status  PT Problem List: Decreased strength;Decreased activity tolerance;Decreased balance;Decreased mobility;Decreased  knowledge of use of DME;Decreased safety awareness;Decreased knowledge of precautions PT Treatment Interventions: Gait training;Functional mobility training;Patient/family education;Therapeutic activities;Therapeutic exercise;Balance training   PT Goals Acute Rehab PT Goals PT Goal Formulation: Patient unable to participate in goal setting Time For Goal Achievement: 08/12/12 Potential to Achieve Goals: Good Pt will go Supine/Side to Sit: with min assist PT Goal: Supine/Side to Sit - Progress: Goal set today Pt will go Sit to Supine/Side: with min assist PT Goal: Sit to Supine/Side - Progress: Goal set today Pt will go Sit to Stand: with supervision PT Goal: Sit to Stand - Progress: Goal set today Pt will go Stand to Sit: with supervision PT Goal: Stand to Sit - Progress: Goal set today Pt will Ambulate: 51 - 150 feet;with supervision;with least restrictive assistive device PT Goal: Ambulate - Progress: Goal set today  Visit Information  Last PT Received On: 08/05/12 Assistance Needed: +1    Subjective Data  Subjective: "Senaida Ores." pt stated when asked where she was.  Probably referring to old [facility]. Patient Stated Goal: Pt unable to state due to confusion.   Prior Functioning  Home Living Lives With: Alone Additional Comments: Pt confused and unable to answer. Prior Function Comments: Lived alone.    Cognition  Overall Cognitive Status: Impaired Area of Impairment: Problem solving;Attention;Following commands Arousal/Alertness: Awake/alert Orientation Level: Disoriented to;Place;Time;Situation Behavior During Session: Flat affect Current Attention Level: Sustained Following Commands: Follows one step commands inconsistently Problem Solving: Pt unable to figure out how to turn feet when trying to get to chair. Cognition - Other Comments: Pt slow to process.    Extremity/Trunk Assessment Right Lower Extremity Assessment RLE ROM/Strength/Tone:  Deficits RLE ROM/Strength/Tone Deficits: functionally 3+/5 Left Lower Extremity Assessment  LLE ROM/Strength/Tone: Deficits LLE ROM/Strength/Tone Deficits: functionally 3+/5   Balance Static Standing Balance Static Standing - Balance Support: Bilateral upper extremity supported (on walker) Static Standing - Level of Assistance: 4: Min assist  End of Session PT - End of Session Equipment Utilized During Treatment: Gait belt Activity Tolerance: Other (comment) (limited by coginition) Patient left: in chair;with call bell/phone within reach Nurse Communication: Mobility status  GP     [facility] 08/05/2012, 9:58 AM  [facility] PT [phone]

## 2012-08-05 NOTE — Progress Notes (Signed)
[facility] PROGRESS NOTE  Audrey Reed ZOX:[id number] DOB: Mar 27, 1918 DOA: 08/04/2012 PCP: Michele Mcalpine, MD  Assessment/Plan:  1. UTI- culture ordered, rocephin started 12/29 , BC x 2 no growth to date. 2. Hypothermia- resolved.  TSH wnl. ?sepsis etiology 3. DM- SSI, CBGs controlled. 4. Dementia- ?baseline.  Planning for SNF at D/C 5. AMS due to UTI- plan to r/o CVA with MRI.  MRI pending as of 12/30 am. Failed swallow.  Speech eval ordered 6. Thrombocytopenia- ?from sepsis, continue to monitor  7. Lower extremity edema and leg pain.  Chronic.  Decrease IVF (but refrain from restarting lasix yet).  Will request elevation of legs and nursing care to skin.  Code Status: full Family Communication:  Disposition Plan: SNF at discharge      Antibiotics:  Rocephin 12/29  HPI/Subjective: My legs hurt.  Otherwise speech is mostly unintelligible.  She mentions [facility] (SNF?)  Objective: Filed Vitals:   08/04/12 2015 08/04/12 2107 08/05/12 0431 08/05/12 0554  BP: 132/53 143/73  158/67  Pulse: 80 80  80  Temp: 97 F (36.1 C) 97.5 F (36.4 C)  98 F (36.7 C)  TempSrc:      Resp: 17 20  18   Height:  5\' 1"  (1.549 m)    Weight:  75.1 kg (165 lb 9.1 oz) 75.2 kg (165 lb 12.6 oz)   SpO2: 98% 97%  98%    Intake/Output Summary (Last 24 hours) at 08/05/12 0850 Last data filed at 08/04/12 1917  Gross per 24 hour  Intake      0 ml  Output    800 ml  Net   -800 ml   Filed Weights   08/04/12 2107 08/05/12 0431  Weight: 75.1 kg (165 lb 9.1 oz) 75.2 kg (165 lb 12.6 oz)    Exam:   General:  Awake, Pleasant, generalized tremor, Confused.  Cardiovascular: rrr, no m/r/g  Respiratory: cta, no w/c/r  Abdomen: soft, nt, nd, +BS  Lower extremities - edematous, 1 - 2+ pitting. With weeping drainage.  Neuro:  Follows commands, calm, speech mostly unintelligible.  Data Reviewed: Basic Metabolic Panel:  Lab 08/04/12 8119  NA 143  K 3.3*  CL 105  CO2 23  GLUCOSE 123*    BUN 42*  CREATININE 1.20*  CALCIUM 8.5  MG --  PHOS --   Liver Function Tests:  Lab 08/04/12 1118  AST 23  ALT 20  ALKPHOS 42  BILITOT 0.8  PROT 5.5*  ALBUMIN 3.0*   CBC:  Lab 08/04/12 1118  WBC 9.8  NEUTROABS --  HGB 12.7  HCT 37.1  MCV 84.3  PLT 64*   Cardiac Enzymes:  Lab 08/04/12 2135 08/04/12 1511 08/04/12 1119  CKTOTAL -- 60 --  CKMB -- -- --  CKMBINDEX -- -- --  TROPONINI <0.30 -- <0.30   BNP (last 3 results) No results found for this basename: PROBNP:3 in the last 8760 hours CBG:  Lab 08/05/12 0806 08/05/12 0410 08/05/12 0030 08/04/12 2333 08/04/12 2039  GLUCAP 133* 108* 114* 104* 112*    Recent Results (from the past 240 hour(s))  CULTURE, BLOOD (ROUTINE X 2)     Status: Normal (Preliminary result)   Collection Time   08/04/12 11:30 AM      Component Value Range Status Comment   Specimen Description BLOOD LEFT HAND   Final    Special Requests BOTTLES DRAWN AEROBIC AND ANAEROBIC 10CC   Final    Culture  Setup Time 08/04/2012 18:44   Final  Culture     Final    Value:        BLOOD CULTURE RECEIVED NO GROWTH TO DATE CULTURE WILL BE HELD FOR 5 DAYS BEFORE ISSUING A FINAL NEGATIVE REPORT   Report Status PENDING   Incomplete   CULTURE, BLOOD (ROUTINE X 2)     Status: Normal (Preliminary result)   Collection Time   08/04/12  1:15 PM      Component Value Range Status Comment   Specimen Description BLOOD LEFT HAND   Final    Special Requests BOTTLES DRAWN AEROBIC AND ANAEROBIC 5CCC   Final    Culture  Setup Time 08/04/2012 18:44   Final    Culture     Final    Value:        BLOOD CULTURE RECEIVED NO GROWTH TO DATE CULTURE WILL BE HELD FOR 5 DAYS BEFORE ISSUING A FINAL NEGATIVE REPORT   Report Status PENDING   Incomplete      Studies: Ct Head Wo Contrast  08/04/2012  *RADIOLOGY REPORT*  Clinical Data:  The patient was found on the floor  CT HEAD WITHOUT CONTRAST CT CERVICAL SPINE WITHOUT CONTRAST  Technique:  Multidetector CT imaging of the head  and cervical spine was performed following the standard protocol without intravenous contrast.  Multiplanar CT image reconstructions of the cervical spine were also generated.  Comparison:  08/04/2012  CT HEAD  Findings: There is diffuse patchy low density throughout the subcortical and periventricular white matter consistent with chronic small vessel ischemic change.  There is prominence of the sulci and ventricles consistent with brain atrophy.  There is no evidence for acute brain infarct, hemorrhage or mass.  Retention cyst versus polyp noted in the right maxillary sinus. The remaining paranasal sinuses are clear.  The mastoid air cells are clear the skull appears intact.  IMPRESSION:  1.  Small vessel ischemic change and brain atrophy. 2.  No acute intracranial abnormalities.  CT CERVICAL SPINE  Findings: Normal alignment of the cervical spine.  The vertebral body heights and disc spaces are well preserved.  Bones are osteopenic.  There is no fracture or subluxation identified. Scarring noted within the anterior right apex.  Carotid artery calcifications are noted bilaterally.  IMPRESSION:  1.  No acute findings. 2.  Carotid artery calcifications.   Original Report Authenticated By: Signa Kell, M.D.    Ct Cervical Spine Wo Contrast  08/04/2012  *RADIOLOGY REPORT*  Clinical Data:  The patient was found on the floor  CT HEAD WITHOUT CONTRAST CT CERVICAL SPINE WITHOUT CONTRAST  Technique:  Multidetector CT imaging of the head and cervical spine was performed following the standard protocol without intravenous contrast.  Multiplanar CT image reconstructions of the cervical spine were also generated.  Comparison:  08/04/2012  CT HEAD  Findings: There is diffuse patchy low density throughout the subcortical and periventricular white matter consistent with chronic small vessel ischemic change.  There is prominence of the sulci and ventricles consistent with brain atrophy.  There is no evidence for acute brain  infarct, hemorrhage or mass.  Retention cyst versus polyp noted in the right maxillary sinus. The remaining paranasal sinuses are clear.  The mastoid air cells are clear the skull appears intact.  IMPRESSION:  1.  Small vessel ischemic change and brain atrophy. 2.  No acute intracranial abnormalities.  CT CERVICAL SPINE  Findings: Normal alignment of the cervical spine.  The vertebral body heights and disc spaces are well preserved.  Bones are osteopenic.  There is no fracture or subluxation identified. Scarring noted within the anterior right apex.  Carotid artery calcifications are noted bilaterally.  IMPRESSION:  1.  No acute findings. 2.  Carotid artery calcifications.   Original Report Authenticated By: Signa Kell, M.D.    Dg Chest Port 1 View  08/04/2012  *RADIOLOGY REPORT*  Clinical Data: Short of breath, chest pain  PORTABLE CHEST - 1 VIEW  Comparison: Chest radiograph 06/09/2004  Findings: Normal mediastinum and heart silhouette.  There is chronic bronchitic markings centrally.  There is a chronic interstitial pattern which is increased compared to prior.  No focal consolidation.  No pneumothorax.  IMPRESSION: Chronic interstitial pattern is increased compared to prior exam from 2005.   Original Report Authenticated By: Genevive Bi, M.D.     Scheduled Meds:   . cefTRIAXone (ROCEPHIN)  IV  1 g Intravenous Q24H  . enoxaparin (LOVENOX) injection  40 mg Subcutaneous Q24H  . insulin aspart  0-15 Units Subcutaneous Q4H  . sodium chloride  3 mL Intravenous Q12H   Continuous Infusions:   . sodium chloride 20 mL/hr at 08/04/12 1833  . sodium chloride 20 mL/hr at 08/04/12 1833  . 0.9 % NaCl with KCl 20 mEq / L 75 mL/hr at 08/04/12 2152    Principal Problem:  *UTI (lower urinary tract infection) Active Problems:  DM  ANXIETY DISORDER, GENERALIZED  Hypothermia  Dementia  Altered mental state  Thrombocytopenia  Hypokalemia    Time spent: 30 min.    Conley Canal  [facility] Pager [phone]. If 8PM-8AM, please contact night-coverage at [email or web address], password [facility] 08/05/2012, 8:50 AM  LOS: 1 day

## 2012-08-06 ENCOUNTER — Inpatient Hospital Stay (HOSPITAL_COMMUNITY): Payer: Medicare Other

## 2012-08-06 DIAGNOSIS — E119 Type 2 diabetes mellitus without complications: Secondary | ICD-10-CM

## 2012-08-06 DIAGNOSIS — D696 Thrombocytopenia, unspecified: Secondary | ICD-10-CM

## 2012-08-06 LAB — BASIC METABOLIC PANEL
CO2: 23 mEq/L (ref 19–32)
Chloride: 106 mEq/L (ref 96–112)
Creatinine, Ser: 1.12 mg/dL — ABNORMAL HIGH (ref 0.50–1.10)

## 2012-08-06 LAB — RPR: RPR Ser Ql: NONREACTIVE

## 2012-08-06 LAB — CBC
MCV: 84.8 fL (ref 78.0–100.0)
Platelets: 105 10*3/uL — ABNORMAL LOW (ref 150–400)
RBC: 4.55 MIL/uL (ref 3.87–5.11)
WBC: 12.6 10*3/uL — ABNORMAL HIGH (ref 4.0–10.5)

## 2012-08-06 LAB — VITAMIN B12: Vitamin B-12: 1132 pg/mL — ABNORMAL HIGH (ref 211–911)

## 2012-08-06 LAB — GLUCOSE, CAPILLARY
Glucose-Capillary: 112 mg/dL — ABNORMAL HIGH (ref 70–99)
Glucose-Capillary: 165 mg/dL — ABNORMAL HIGH (ref 70–99)
Glucose-Capillary: 166 mg/dL — ABNORMAL HIGH (ref 70–99)

## 2012-08-06 LAB — URINE CULTURE

## 2012-08-06 MED ORDER — FUROSEMIDE 20 MG PO TABS
20.0000 mg | ORAL_TABLET | Freq: Every day | ORAL | Status: DC
  Filled 2012-08-06: qty 1

## 2012-08-06 MED ORDER — SODIUM CHLORIDE 0.45 % IV SOLN
INTRAVENOUS | Status: DC
  Administered 2012-08-06: 1000 mL via INTRAVENOUS

## 2012-08-06 MED ORDER — ENSURE PUDDING PO PUDG
1.0000 | Freq: Three times a day (TID) | ORAL | Status: DC
  Administered 2012-08-06 – 2012-08-15 (×16): 1 via ORAL

## 2012-08-06 MED ORDER — CARVEDILOL 12.5 MG PO TABS
12.5000 mg | ORAL_TABLET | Freq: Two times a day (BID) | ORAL | Status: DC
  Administered 2012-08-06 – 2012-08-15 (×12): 12.5 mg via ORAL
  Filled 2012-08-06 (×21): qty 1

## 2012-08-06 MED ORDER — ENOXAPARIN SODIUM 30 MG/0.3ML ~~LOC~~ SOLN
30.0000 mg | SUBCUTANEOUS | Status: DC
  Administered 2012-08-06 – 2012-08-11 (×6): 30 mg via SUBCUTANEOUS
  Filled 2012-08-06 (×7): qty 0.3

## 2012-08-06 MED ORDER — FUROSEMIDE 40 MG PO TABS
40.0000 mg | ORAL_TABLET | Freq: Every day | ORAL | Status: DC
  Filled 2012-08-06: qty 1

## 2012-08-06 MED ORDER — LISINOPRIL 40 MG PO TABS
40.0000 mg | ORAL_TABLET | Freq: Every day | ORAL | Status: DC
  Filled 2012-08-06 (×6): qty 1

## 2012-08-06 NOTE — Progress Notes (Signed)
Clinical Social Work Department BRIEF PSYCHOSOCIAL ASSESSMENT 08/06/2012  Patient:  Audrey Reed, Audrey Reed     Account Number:  [id number]     Admit date:  08/04/2012  Clinical Social Worker:  Dennison Bulla  Date/Time:  08/06/2012 04:00 PM  Referred by:  Physician  Date Referred:  08/06/2012 Referred for  SNF Placement   Other Referral:   Interview type:  Family Other interview type:    PSYCHOSOCIAL DATA Living Status:  ALONE Admitted from facility:   Level of care:   Primary support name:  [name] Primary support relationship to patient:  CHILD, ADULT Degree of support available:   Adequate    CURRENT CONCERNS Current Concerns  Post-Acute Placement   Other Concerns:    SOCIAL WORK ASSESSMENT / PLAN CSW received referral to assist with dc planning. CSW reviewed chart and attempted to meet with patient. Patient confused and unable to participate in assessment. CSW spoke with son via phone.    CSW introduced myself and explained role. Son reports that patient lived alone PTA. CSW spoke with son regarding dc plans. Son reports that he was going to discuss plans with patient and family. CSW explained recommendation for SNF placement. CSW left SNF list in room and explained process with Medicare benefits. Son not agreeable to SNF search until he speaks with family.    CSW will continue to follow.   Assessment/plan status:  Psychosocial Support/Ongoing Assessment of Needs Other assessment/ plan:   Information/referral to community resources:   SNF list    PATIENT'S/FAMILY'S RESPONSE TO PLAN OF CARE: Patient unable to participate in assessment. Son engaged throughout assessment but wants time to discuss plans with family before making any decisions.

## 2012-08-06 NOTE — Progress Notes (Signed)
INITIAL NUTRITION ASSESSMENT  DOCUMENTATION CODES Per approved criteria  -Obesity Unspecified   INTERVENTION: 1. Continue Ensure Complete po TID, each supplement provides 350 kcal and 13 grams of protein.  2. Magic Cup with meals 3. RD will continue to follow    NUTRITION DIAGNOSIS: Inadequate oral intake  related to AMS as evidenced by 20% meal completion.   Goal: PO intake to meet >/=90% estimated nutrition needs  Monitor:  PO intake, weight trends, I/O's  Reason for Assessment: Consult   78 y.o. female  Admitting Dx: UTI (lower urinary tract infection)  ASSESSMENT: Pt admitted with UTI and AMS. Pt still not with intelligible speech.  Per tech, pt did eat some breakfast, but was pocketing some foods. Did eat a Mining engineer. At time of RD assessment pt had not yet started on her lunch (Tech reports it just arrived). SLP following pt for diet advance.  Weight hx shows weight trending up recently. No family available to speak with about po intake PTA.  Currently has Ensure Complete ordered TID- will continue this. Will also have magic cup on all meal trays.   Height: Ht Readings from Last 1 Encounters:  08/04/12 5\' 1"  (1.549 m)    Weight: Wt Readings from Last 1 Encounters:  08/06/12 165 lb 9.1 oz (75.1 kg)    Ideal Body Weight: 105 lbs   % Ideal Body Weight: 157%  Wt Readings from Last 10 Encounters:  08/06/12 165 lb 9.1 oz (75.1 kg)  07/01/12 163 lb 3.2 oz (74.027 kg)  06/03/12 152 lb 9.6 oz (69.219 kg)  04/22/12 155 lb 6.4 oz (70.489 kg)  10/17/11 164 lb 9.6 oz (74.662 kg)  04/19/11 166 lb 3.2 oz (75.388 kg)  02/15/11 166 lb 9.6 oz (75.569 kg)  10/17/10 168 lb 6.1 oz (76.377 kg)  04/18/10 174 lb 2.1 oz (78.985 kg)  10/18/09 178 lb 2.1 oz (80.8 kg)    Usual Body Weight: ~165 lbs   % Usual Body Weight: 100%  BMI:  Body mass index is 31.28 kg/(m^2). Obesity class 1  Estimated Nutritional Needs: Kcal: 1350-1550 Protein: 55-65 gm  Fluid: 1.4-1.6 L/day    Skin: Stage I to sacrum   Diet Order: Dysphagia 2, thin  EDUCATION NEEDS: -No education needs identified at this time   Intake/Output Summary (Last 24 hours) at 08/06/12 1246 Last data filed at 08/06/12 1610  Gross per 24 hour  Intake    243 ml  Output   1575 ml  Net  -1332 ml    Last BM: PTA   Labs:   Lab 08/06/12 0900 08/05/12 1127 08/04/12 1118  NA 146* 144 143  K 3.7 4.1 3.3*  CL 106 105 105  CO2 23 26 23   BUN 33* 36* 42*  CREATININE 1.12* 1.18* 1.20*  CALCIUM 9.5 9.6 8.5  MG -- -- --  PHOS -- -- --  GLUCOSE 72 86 123*    CBG (last 3)   Basename 08/06/12 1211 08/06/12 0808 08/06/12 0415  GLUCAP 166* 77 153*    Scheduled Meds:   . carvedilol  12.5 mg Oral BID WC  . cefTRIAXone (ROCEPHIN)  IV  1 g Intravenous Q24H  . enoxaparin (LOVENOX) injection  30 mg Subcutaneous Q24H  . feeding supplement  1 Container Oral TID BM  . furosemide  20 mg Oral Daily  . insulin aspart  0-15 Units Subcutaneous Q4H  . lisinopril  40 mg Oral Daily  . sodium chloride  3 mL Intravenous Q12H  Continuous Infusions:   Past Medical History  Diagnosis Date  . HTN (hypertension)   . CAD (coronary artery disease)   . Venous insufficiency   . Hypercholesterolemia   . DM (diabetes mellitus)   . Diverticulosis of colon   . Colon polyp   . Adenocarcinoma of breast   . DJD (degenerative joint disease)   . Osteoporosis   . Anxiety disorder     Past Surgical History  Procedure Date  . Cataract extraction   . Mastectomy     for breast cancer    Clarene Duke RD, LDN Pager [phone] After Hours pager [phone]

## 2012-08-06 NOTE — Progress Notes (Signed)
[facility] PROGRESS NOTE  Audrey Reed MWU:[id number] DOB: 09-10-1917 DOA: 08/04/2012 PCP: Michele Mcalpine, MD  Assessment/Plan:  1. UTI - culture is + for ecoli, pan-sensitive.  rocephin started at admission, BC x 2 no growth to date.  Removed foley. 2. Leukocytosis.  WBC was wnl now slowly climbing to 12.6.  Check CXR.  Has UTI on antibiotics. 3. Hypothermia- resolved.  TSH wnl. ?sepsis etiology 4. DM- SSI, CBGs controlled.  Patient eating very little.   5. Dementia- Per family she is more confused than usual. Check B12, RPR.  TSH is WNL  Planning for SNF at D/C 6. AMS due to UTI + dementia.  MRI negative for acute changes.  7. Thrombocytopenia- ?from sepsis, continue to monitor  8. Lower extremity edema and leg pain.  Chronic. Appears better today. 9. Grade 1 diastolic dysfunction - noted on echo.  Lasix held at this point.  Appears Euvolemic on exam.  Family expressed concern that patient had difficulty breathing.  Will recheck CXR.  Code Status: full - (Per grandson, CRNA, he would not like to see her have a feeding tube or be intubated - but he will discuss these things with the family for consensus) Family Communication: Son and grandson at bedside. Disposition Plan: SNF at discharge      Antibiotics:  Rocephin 12/29  HPI/Subjective: When asked to pronounce her name, Audrey Reed speaks a list of numbers.  Calm.  No intelligible complaints. Son and Audrey Reed who is a Scientist, clinical (histocompatibility and immunogenetics)) are at bedside.  Objective: Filed Vitals:   08/05/12 1045 08/05/12 2026 08/06/12 0425 08/06/12 0500  BP:  156/84 172/87   Pulse:  76 87   Temp:  98.4 F (36.9 C) 97.4 F (36.3 C)   TempSrc:  Oral Oral   Resp:  24 20   Height:      Weight:    75.1 kg (165 lb 9.1 oz)  SpO2: 91% 99% 92%     Intake/Output Summary (Last 24 hours) at 08/06/12 1033 Last data filed at 08/06/12 7253  Gross per 24 hour  Intake    243 ml  Output   1575 ml  Net  -1332 ml   Filed Weights   08/04/12 2107  08/05/12 0431 08/06/12 0500  Weight: 75.1 kg (165 lb 9.1 oz) 75.2 kg (165 lb 12.6 oz) 75.1 kg (165 lb 9.1 oz)    Exam:   General:  Awake, Pleasant, generalized tremor, Confused.  Cardiovascular: rrr, no m/r/g  Respiratory: cta, no w/c/r  Abdomen: soft, nt, nd, +BS  Lower extremities - edematous, 1+ pitting.  Neuro:  Follows commands, calm, speech mostly unintelligible.  Data Reviewed: Basic Metabolic Panel:  Lab 08/06/12 6644 08/05/12 1127 08/04/12 1118  NA 146* 144 143  K 3.7 4.1 3.3*  CL 106 105 105  CO2 23 26 23   GLUCOSE 72 86 123*  BUN 33* 36* 42*  CREATININE 1.12* 1.18* 1.20*  CALCIUM 9.5 9.6 8.5  MG -- -- --  PHOS -- -- --   Liver Function Tests:  Lab 08/04/12 1118  AST 23  ALT 20  ALKPHOS 42  BILITOT 0.8  PROT 5.5*  ALBUMIN 3.0*   CBC:  Lab 08/06/12 0900 08/05/12 1127 08/04/12 1118  WBC 12.6* 10.5 9.8  NEUTROABS -- -- --  HGB 13.0 14.6 12.7  HCT 38.6 43.6 37.1  MCV 84.8 84.8 84.3  PLT 105* 114* 64*   Cardiac Enzymes:  Lab 08/05/12 1127 08/04/12 2135 08/04/12 1511 08/04/12 1119  CKTOTAL -- --  60 --  CKMB -- -- -- --  CKMBINDEX -- -- -- --  TROPONINI <0.30 <0.30 -- <0.30   BNP (last 3 results) No results found for this basename: PROBNP:3 in the last 8760 hours CBG:  Lab 08/06/12 0808 08/06/12 0415 08/06/12 08/05/12 2022 08/05/12 1659  GLUCAP 77 153* 112* 102* 122*    Recent Results (from the past 240 hour(s))  URINE CULTURE     Status: Normal   Collection Time   08/04/12 11:15 AM      Component Value Range Status Comment   Specimen Description URINE, CATHETERIZED   Final    Special Requests NONE   Final    Culture  Setup Time 08/04/2012 18:59   Final    Colony Count >=100,000 COLONIES/ML   Final    Culture ESCHERICHIA COLI   Final    Report Status 08/06/2012 FINAL   Final    Organism ID, Bacteria ESCHERICHIA COLI   Final   CULTURE, BLOOD (ROUTINE X 2)     Status: Normal (Preliminary result)   Collection Time   08/04/12 11:30 AM        Component Value Range Status Comment   Specimen Description BLOOD LEFT HAND   Final    Special Requests BOTTLES DRAWN AEROBIC AND ANAEROBIC 10CC   Final    Culture  Setup Time 08/04/2012 18:44   Final    Culture     Final    Value:        BLOOD CULTURE RECEIVED NO GROWTH TO DATE CULTURE WILL BE HELD FOR 5 DAYS BEFORE ISSUING A FINAL NEGATIVE REPORT   Report Status PENDING   Incomplete   CULTURE, BLOOD (ROUTINE X 2)     Status: Normal (Preliminary result)   Collection Time   08/04/12  1:15 PM      Component Value Range Status Comment   Specimen Description BLOOD LEFT HAND   Final    Special Requests BOTTLES DRAWN AEROBIC AND ANAEROBIC 5CCC   Final    Culture  Setup Time 08/04/2012 18:44   Final    Culture     Final    Value:        BLOOD CULTURE RECEIVED NO GROWTH TO DATE CULTURE WILL BE HELD FOR 5 DAYS BEFORE ISSUING A FINAL NEGATIVE REPORT   Report Status PENDING   Incomplete      Studies: Ct Head Wo Contrast  08/04/2012  *RADIOLOGY REPORT*  Clinical Data:  The patient was found on the floor  CT HEAD WITHOUT CONTRAST CT CERVICAL SPINE WITHOUT CONTRAST  Technique:  Multidetector CT imaging of the head and cervical spine was performed following the standard protocol without intravenous contrast.  Multiplanar CT image reconstructions of the cervical spine were also generated.  Comparison:  08/04/2012  CT HEAD  Findings: There is diffuse patchy low density throughout the subcortical and periventricular white matter consistent with chronic small vessel ischemic change.  There is prominence of the sulci and ventricles consistent with brain atrophy.  There is no evidence for acute brain infarct, hemorrhage or mass.  Retention cyst versus polyp noted in the right maxillary sinus. The remaining paranasal sinuses are clear.  The mastoid air cells are clear the skull appears intact.  IMPRESSION:  1.  Small vessel ischemic change and brain atrophy. 2.  No acute intracranial abnormalities.  CT  CERVICAL SPINE  Findings: Normal alignment of the cervical spine.  The vertebral body heights and disc spaces are well preserved.  Bones are  osteopenic.  There is no fracture or subluxation identified. Scarring noted within the anterior right apex.  Carotid artery calcifications are noted bilaterally.  IMPRESSION:  1.  No acute findings. 2.  Carotid artery calcifications.   Original Report Authenticated By: Signa Kell, M.D.    Ct Cervical Spine Wo Contrast  08/04/2012  *RADIOLOGY REPORT*  Clinical Data:  The patient was found on the floor  CT HEAD WITHOUT CONTRAST CT CERVICAL SPINE WITHOUT CONTRAST  Technique:  Multidetector CT imaging of the head and cervical spine was performed following the standard protocol without intravenous contrast.  Multiplanar CT image reconstructions of the cervical spine were also generated.  Comparison:  08/04/2012  CT HEAD  Findings: There is diffuse patchy low density throughout the subcortical and periventricular white matter consistent with chronic small vessel ischemic change.  There is prominence of the sulci and ventricles consistent with brain atrophy.  There is no evidence for acute brain infarct, hemorrhage or mass.  Retention cyst versus polyp noted in the right maxillary sinus. The remaining paranasal sinuses are clear.  The mastoid air cells are clear the skull appears intact.  IMPRESSION:  1.  Small vessel ischemic change and brain atrophy. 2.  No acute intracranial abnormalities.  CT CERVICAL SPINE  Findings: Normal alignment of the cervical spine.  The vertebral body heights and disc spaces are well preserved.  Bones are osteopenic.  There is no fracture or subluxation identified. Scarring noted within the anterior right apex.  Carotid artery calcifications are noted bilaterally.  IMPRESSION:  1.  No acute findings. 2.  Carotid artery calcifications.   Original Report Authenticated By: Signa Kell, M.D.    Mr Brain Wo Contrast  08/05/2012  *RADIOLOGY REPORT*   Clinical Data: Unsteady gait.  Garbled speech.  Right-sided facial droop.  Diabetic.  MRI HEAD WITHOUT CONTRAST  Technique:  Multiplanar, multiecho pulse sequences of the brain and surrounding structures were obtained according to standard protocol without intravenous contrast.  Comparison: 08/04/2012 CT.  No comparison MR.  Findings: No acute infarct.  No intracranial hemorrhage.  Moderate to marked small vessel disease type changes.  Global atrophy without hydrocephalus.  No intracranial mass lesion detected on this unenhanced exam.  Major intracranial vascular structures are patent.  IMPRESSION: No acute infarct.  Moderate to marked small vessel disease type changes.   Original Report Authenticated By: Lacy Duverney, M.D.    Dg Chest Port 1 View  08/04/2012  *RADIOLOGY REPORT*  Clinical Data: Short of breath, chest pain  PORTABLE CHEST - 1 VIEW  Comparison: Chest radiograph 06/09/2004  Findings: Normal mediastinum and heart silhouette.  There is chronic bronchitic markings centrally.  There is a chronic interstitial pattern which is increased compared to prior.  No focal consolidation.  No pneumothorax.  IMPRESSION: Chronic interstitial pattern is increased compared to prior exam from 2005.   Original Report Authenticated By: Genevive Bi, M.D.     Scheduled Meds:    . cefTRIAXone (ROCEPHIN)  IV  1 g Intravenous Q24H  . enoxaparin (LOVENOX) injection  30 mg Subcutaneous Q24H  . insulin aspart  0-15 Units Subcutaneous Q4H  . sodium chloride  3 mL Intravenous Q12H   Continuous Infusions:   Principal Problem:  *UTI (lower urinary tract infection) Active Problems:  DM  ANXIETY DISORDER, GENERALIZED  Hypothermia  Dementia  Altered mental state  Thrombocytopenia  Hypokalemia    Time spent: 30 min.    Conley Canal  [facility] Pager [phone]. If 8PM-8AM, please contact  night-coverage at [email or web address], password [facility] 08/06/2012, 10:33 AM  LOS: 2 days

## 2012-08-06 NOTE — Progress Notes (Signed)
Speech Language Pathology Dysphagia Treatment Patient Details Name: Audrey Reed MRN: [id number] DOB: May 17, 1918 Today's Date: 08/06/2012 Time: 1440-1500 SLP Time Calculation (min): 20 min  Assessment / Plan / Recommendation Clinical Impression  Treatment session focused on tolerance of thin liquids and trials of solid texture. SLP utlized max contextual and tactile cues to increase pt awareness of trials in setting of increased confusion. Pt again consumed thin liquids without signs of aspiration, but was not aware of solid trials and did not masticate offered cracker. Would recommend pt downgrade to puree diet and thin liquids with full supervision. SLP will f/u in 2-3 days for tolerance/ upgrade.     Diet Recommendation  Initiate / Change Diet: Dysphagia 1 (puree);Thin liquid    SLP Plan Continue with current plan of care   Pertinent Vitals/Pain NA   Swallowing Goals  SLP Swallowing Goals Patient will consume recommended diet without observed clinical signs of aspiration with: Moderate assistance Swallow Study Goal #1 - Progress: Progressing toward goal Patient will utilize recommended strategies during swallow to increase swallowing safety with: Minimal cueing Swallow Study Goal #2 - Progress: Progressing toward goal  General Temperature Spikes Noted: No Respiratory Status: Supplemental O2 delivered via (comment) Behavior/Cognition: Alert;Confused Oral Cavity - Dentition: Dentures, top;Dentures, bottom Patient Positioning: Upright in chair  Oral Cavity - Oral Hygiene Patient is HIGH RISK - Oral Care Protocol followed (see row info): Yes   Dysphagia Treatment Treatment focused on: Upgraded PO texture trials;Patient/family/caregiver education;Facilitation of oral preparatory phase Treatment Methods/Modalities: Skilled observation Liquids provided via: Cup;Straw   GO     Doralee Kocak, Riley Nearing 08/06/2012, 3:10 PM

## 2012-08-06 NOTE — Progress Notes (Signed)
Patient seen and examined by me.  More confused today: Na higher- encourage free water, UTI ecoli- being treated, ordered chest xray.  Marlin Canary DO

## 2012-08-06 NOTE — Care Management Note (Unsigned)
    Page 1 of 1   08/06/2012     6:23:45 PM   CARE MANAGEMENT NOTE 08/06/2012  Patient:  Audrey Reed, Audrey Reed   Account Number:  [id number]  Date Initiated:  08/06/2012  Documentation initiated by:  Letha Cape  Subjective/Objective Assessment:   dx uti  admit- lives alone     Action/Plan:   pt eval- rec snf   Anticipated DC Date:  08/09/2012   Anticipated DC Plan:  SKILLED NURSING FACILITY  In-house referral  Clinical Social Worker         Choice offered to / List presented to:             Status of service:  In process, will continue to follow Medicare Important Message given?   (If response is "NO", the following Medicare IM given date fields will be blank) Date Medicare IM given:   Date Additional Medicare IM given:    Discharge Disposition:    Per UR Regulation:  Reviewed for med. necessity/level of care/duration of stay  If discussed at Long Length of Stay Meetings, dates discussed:    Comments:

## 2012-08-06 NOTE — Plan of Care (Signed)
Problem: Phase I Progression Outcomes Goal: Initial discharge plan identified Outcome: Completed/Met Date Met:  08/06/12 For SNF placement

## 2012-08-06 NOTE — Progress Notes (Signed)
Occupational Therapy Treatment Patient Details Name: Audrey Reed MRN: [id number] DOB: [birth year] Today's Date: 08/06/2012 Time: 4098-1191 OT Time Calculation (min): 28 min  OT Assessment / Plan / Recommendation Comments on Treatment Session Pt with more confusion and decreased awareness this session compared to previous days session.  Not oriented to place, time, or situation.  Pt perseverated on not going out in the rain and would not even walk over to the sink for participation in grooming tasks secondary to confusion.  Did perform one sit to stand but needed max facilitaion secondary to confusion.  Will need SNF for follow-up rehab.    Follow Up Recommendations  SNF       Equipment Recommendations  None recommended by OT       Frequency Min 2X/week   Plan Discharge plan remains appropriate    Precautions / Restrictions Precautions Precautions: Fall Restrictions Weight Bearing Restrictions: No   Pertinent Vitals/Pain Pain not stated, pt with no facial signs of pain    ADL  Grooming: Performed;Supervision/safety Where Assessed - Grooming: Supported sitting Transfers/Ambulation Related to ADLs: Did not attempt secondary to confusion and agitation. ADL Comments: Pt with increased confusion compared to yesterday.  Not oriented to place, time, or situation.  Perseverated on not going out in the rain.  Attempted to re-orient pt and get her to stand and walk to the sink for grooming task and rinsing her mouth out.  Pt with noted mouth full of food and not attempting to swallow it.  Only able to get her to stand one time with max assist but could not get her to ambulate to the sink secondary to her currentl confusion.  Had pt rinse mouth out in sitting.  Noticed pt with bandaid in her mouth that she had been chewing on.  Nursing notified and they came in to attend to the IV.  She also combed her hair with min assist for thoroughness while sitting.        OT Goals ADL Goals ADL Goal:  Grooming - Progress: Not progressing  Visit Information  Last OT Received On: 08/06/12 Assistance Needed: +1    Subjective Data  Subjective: I'm not gonna go out in the rain, now I've done told you that. Patient Stated Goal: Pt unable to state      Cognition  Overall Cognitive Status: Impaired Area of Impairment: Safety/judgement;Following commands;Memory;Awareness of deficits;Problem solving Arousal/Alertness: Awake/alert Orientation Level: Disoriented to;Place;Time;Situation Behavior During Session: Agitated Attention - Other Comments: decreased focused attention Following Commands: Follows one step commands inconsistently    Mobility  Shoulder Instructions Transfers Transfers: Sit to Stand Sit to Stand: 2: Max assist;With armrests;From chair/3-in-1 Stand to Sit: 2: Max assist;With armrests;To chair/3-in-1          Balance Balance Balance Assessed: Yes Static Standing Balance Static Standing - Balance Support: Right upper extremity supported;Left upper extremity supported Static Standing - Level of Assistance: 3: Mod assist   End of Session OT - End of Session Activity Tolerance: Treatment limited secondary to agitation Patient left: in chair;with call bell/phone within reach;with nursing in room Nurse Communication: Mobility status     Jessilynn Taft OTR/L Pager number [phone] 08/06/2012, 2:40 PM

## 2012-08-07 ENCOUNTER — Inpatient Hospital Stay (HOSPITAL_COMMUNITY): Payer: Medicare Other

## 2012-08-07 DIAGNOSIS — R0902 Hypoxemia: Secondary | ICD-10-CM | POA: Diagnosis present

## 2012-08-07 LAB — BLOOD GAS, ARTERIAL
Acid-Base Excess: 1.5 mmol/L (ref 0.0–2.0)
Bicarbonate: 26.3 mEq/L — ABNORMAL HIGH (ref 20.0–24.0)
Drawn by: 10006
FIO2: 0.5 %
O2 Saturation: 96.9 %
Patient temperature: 98.6
TCO2: 27.7 mmol/L (ref 0–100)
pCO2 arterial: 46.5 mmHg — ABNORMAL HIGH (ref 35.0–45.0)
pH, Arterial: 7.37 (ref 7.350–7.450)
pO2, Arterial: 82.5 mmHg (ref 80.0–100.0)

## 2012-08-07 LAB — CBC
HCT: 32 % — ABNORMAL LOW (ref 36.0–46.0)
MCHC: 33.1 g/dL (ref 30.0–36.0)
Platelets: 97 10*3/uL — ABNORMAL LOW (ref 150–400)
RDW: 18.1 % — ABNORMAL HIGH (ref 11.5–15.5)

## 2012-08-07 LAB — GLUCOSE, CAPILLARY
Glucose-Capillary: 133 mg/dL — ABNORMAL HIGH (ref 70–99)
Glucose-Capillary: 107 mg/dL — ABNORMAL HIGH (ref 70–99)

## 2012-08-07 LAB — BASIC METABOLIC PANEL
BUN: 33 mg/dL — ABNORMAL HIGH (ref 6–23)
Creatinine, Ser: 1.26 mg/dL — ABNORMAL HIGH (ref 0.50–1.10)
GFR calc Af Amer: 42 mL/min — ABNORMAL LOW (ref >90)
GFR calc non Af Amer: 36 mL/min — ABNORMAL LOW (ref >90)
Potassium: 3.5 mEq/L (ref 3.5–5.1)

## 2012-08-07 MED ORDER — DEXTROSE 5 % IV SOLN
INTRAVENOUS | Status: DC
  Administered 2012-08-07 – 2012-08-09 (×4): via INTRAVENOUS

## 2012-08-07 NOTE — Progress Notes (Signed)
EEG completed.

## 2012-08-07 NOTE — Progress Notes (Signed)
[facility] PROGRESS NOTE  Audrey Reed ZOX:[id number] DOB: May 15, 1918 DOA: 08/04/2012 PCP: Michele Mcalpine, MD  Assessment/Plan:  1. UTI - culture is + for ecoli, pan-sensitive.  rocephin started at admission, BC x 2 no growth to date.  Removed foley. 2. Leukocytosis.  WBC was wnl now slowly climbing to 12.6.   CXR ok.  Has UTI on antibiotics- appears to be aspirating some as well 3. Hypothermia- resolved.  TSH wnl. ?sepsis etiology 4. DM- SSI, CBGs controlled.  Patient eating very little.   5. Dementia- Per family she is more confused than usual. B12, RPR ok.  TSH is WNL  Planning for SNF at D/C 6. AMS due to UTI + dementia +/- hypernatremia.  MRI negative for acute changes- patient having staring episodes- ? seziures- EEG ordered- neurology consulted 7. Hypernatremia- D5W while patient not eating well- holding lasix 8. Thrombocytopenia- ?from sepsis, continue to monitor  9. Lower extremity edema and leg pain.  Chronic. Appears better today. 10. Grade 1 diastolic dysfunction - noted on echo.  Lasix held at this point.  Appears Euvolemic on exam.  Family expressed concern that patient had difficulty breathing- seems to be having trouble with secretions when fully awake, better able to control.  Will recheck CXR.  Code Status: full - (Per grandson, CRNA, he would not like to see her have a feeding tube or be intubated - but he will discuss these things with the family for consensus) Family Communication: Son and grandson at bedside. Disposition Plan: SNF at discharge      Antibiotics:  Rocephin 12/29  HPI/Subjective: Staring to left side this AM, mumbling after  Objective: Filed Vitals:   08/06/12 2146 08/07/12 0422 08/07/12 0451 08/07/12 0500  BP: 165/82  172/82   Pulse: 74  82   Temp: 98.3 F (36.8 C)  98.1 F (36.7 C)   TempSrc: Oral  Oral   Resp: 20  18   Height:      Weight:  72 kg (158 lb 11.7 oz)  71.895 kg (158 lb 8 oz)  SpO2: 96%  97%     Intake/Output  Summary (Last 24 hours) at 08/07/12 0946 Last data filed at 08/07/12 0500  Gross per 24 hour  Intake 666.66 ml  Output    300 ml  Net 366.66 ml   Filed Weights   08/06/12 0500 08/07/12 0422 08/07/12 0500  Weight: 75.1 kg (165 lb 9.1 oz) 72 kg (158 lb 11.7 oz) 71.895 kg (158 lb 8 oz)    Exam:   General: sleepy- lots of pocketed secretions  Cardiovascular: rrr, no m/r/g  Respiratory: cta, no w/c/r  Abdomen: soft, nt, nd, +BS  Lower extremities - edematous, 1+ pitting.  Neuro:   speech mostly unintelligible- not following commands  Data Reviewed: Basic Metabolic Panel:  Lab 08/07/12 8119 08/06/12 0900 08/05/12 1127 08/04/12 1118  NA 150* 146* 144 143  K 3.5 3.7 4.1 3.3*  CL 110 106 105 105  CO2 24 23 26 23   GLUCOSE 131* 72 86 123*  BUN 33* 33* 36* 42*  CREATININE 1.26* 1.12* 1.18* 1.20*  CALCIUM 9.4 9.5 9.6 8.5  MG -- -- -- --  PHOS -- -- -- --   Liver Function Tests:  Lab 08/04/12 1118  AST 23  ALT 20  ALKPHOS 42  BILITOT 0.8  PROT 5.5*  ALBUMIN 3.0*   CBC:  Lab 08/07/12 0533 08/06/12 0900 08/05/12 1127 08/04/12 1118  WBC 11.0* 12.6* 10.5 9.8  NEUTROABS -- -- -- --  HGB 10.6* 13.0 14.6 12.7  HCT 32.0* 38.6 43.6 37.1  MCV 86.0 84.8 84.8 84.3  PLT 97* 105* 114* 64*   Cardiac Enzymes:  Lab 08/05/12 1127 08/04/12 2135 08/04/12 1511 08/04/12 1119  CKTOTAL -- -- 60 --  CKMB -- -- -- --  CKMBINDEX -- -- -- --  TROPONINI <0.30 <0.30 -- <0.30   BNP (last 3 results) No results found for this basename: PROBNP:3 in the last 8760 hours CBG:  Lab 08/07/12 0741 08/07/12 0415 08/06/12 2338 08/06/12 2019 08/06/12 1627  GLUCAP 119* 133* 108* 165* 101*    Recent Results (from the past 240 hour(s))  URINE CULTURE     Status: Normal   Collection Time   08/04/12 11:15 AM      Component Value Range Status Comment   Specimen Description URINE, CATHETERIZED   Final    Special Requests NONE   Final    Culture  Setup Time 08/04/2012 18:59   Final    Colony  Count >=100,000 COLONIES/ML   Final    Culture ESCHERICHIA COLI   Final    Report Status 08/06/2012 FINAL   Final    Organism ID, Bacteria ESCHERICHIA COLI   Final   CULTURE, BLOOD (ROUTINE X 2)     Status: Normal (Preliminary result)   Collection Time   08/04/12 11:30 AM      Component Value Range Status Comment   Specimen Description BLOOD LEFT HAND   Final    Special Requests BOTTLES DRAWN AEROBIC AND ANAEROBIC 10CC   Final    Culture  Setup Time 08/04/2012 18:44   Final    Culture     Final    Value:        BLOOD CULTURE RECEIVED NO GROWTH TO DATE CULTURE WILL BE HELD FOR 5 DAYS BEFORE ISSUING A FINAL NEGATIVE REPORT   Report Status PENDING   Incomplete   CULTURE, BLOOD (ROUTINE X 2)     Status: Normal (Preliminary result)   Collection Time   08/04/12  1:15 PM      Component Value Range Status Comment   Specimen Description BLOOD LEFT HAND   Final    Special Requests BOTTLES DRAWN AEROBIC AND ANAEROBIC 5CCC   Final    Culture  Setup Time 08/04/2012 18:44   Final    Culture     Final    Value:        BLOOD CULTURE RECEIVED NO GROWTH TO DATE CULTURE WILL BE HELD FOR 5 DAYS BEFORE ISSUING A FINAL NEGATIVE REPORT   Report Status PENDING   Incomplete      Studies: Mr Brain 91 Contrast  08/05/2012  *RADIOLOGY REPORT*  Clinical Data: Unsteady gait.  Garbled speech.  Right-sided facial droop.  Diabetic.  MRI HEAD WITHOUT CONTRAST  Technique:  Multiplanar, multiecho pulse sequences of the brain and surrounding structures were obtained according to standard protocol without intravenous contrast.  Comparison: 08/04/2012 CT.  No comparison MR.  Findings: No acute infarct.  No intracranial hemorrhage.  Moderate to marked small vessel disease type changes.  Global atrophy without hydrocephalus.  No intracranial mass lesion detected on this unenhanced exam.  Major intracranial vascular structures are patent.  IMPRESSION: No acute infarct.  Moderate to marked small vessel disease type changes.    Original Report Authenticated By: Lacy Duverney, M.D.    Dg Chest Port 1 View  08/06/2012  *RADIOLOGY REPORT*  Clinical Data: Shortness of breath, cough.  PORTABLE CHEST - 1 VIEW  Comparison: 08/04/2012  Findings: Bibasilar airspace opacities, likely atelectasis.  Small layering effusions bilaterally.  Heart is borderline in size. Diffuse peribronchial thickening.  Stable chronic interstitial prominence. No acute bony abnormality.  IMPRESSION: Stable chronic bronchitic changes and interstitial disease.  Bibasilar atelectasis with small bilateral effusions.   Original Report Authenticated By: Charlett Nose, M.D.     Scheduled Meds:    . carvedilol  12.5 mg Oral BID WC  . cefTRIAXone (ROCEPHIN)  IV  1 g Intravenous Q24H  . enoxaparin (LOVENOX) injection  30 mg Subcutaneous Q24H  . feeding supplement  1 Container Oral TID BM  . insulin aspart  0-15 Units Subcutaneous Q4H  . lisinopril  40 mg Oral Daily  . sodium chloride  3 mL Intravenous Q12H   Continuous Infusions:    . dextrose 75 mL/hr at 08/07/12 1610    Principal Problem:  *UTI (lower urinary tract infection) Active Problems:  DM  ANXIETY DISORDER, GENERALIZED  Hypothermia  Dementia  Altered mental state  Thrombocytopenia  Hypokalemia    Time spent: 35 min.    Marlin Canary,  [facility] Pager [phone]. If 8PM-8AM, please contact night-coverage at [email or web address], password [facility] 08/07/2012, 9:46 AM  LOS: 3 days

## 2012-08-07 NOTE — Progress Notes (Signed)
Pt found with O2 sat 72% using fingers to left hand. 02 sat checked on ear 82% on 3-4L [place]. Non rebreather placed. RT, rapid response nurse and FNP on call notified.  Nasotracheal completed by RT with moderate amount of thick white, pink tinged secretions. Pt currently on a venti mask 50% sats 95%. Report called to receiving nurse on [address]. Son [place], [phone], notified pt moved to 3310.  Julien Nordmann [facility]

## 2012-08-07 NOTE — Consult Note (Addendum)
Reason for Consult: staring spells  Referring Physician: Dr. Benjamine Mola  CC: asked to see confused patient for staring spells.  HPI: Audrey Reed is an 79 y.o. female who apparently lives alone who was admitted with UTI. On admission, gait was unsteady since before Christmas but attributed to xanax. Admitted from home with hypotension and hypothermia (T-89). Urine cx +, started on abx. She does have underlying dementia but seems to be worse per family. Now she has been noted to have spells of staring lasting 2-3 minutes per nurse. She then snaps out of it. She is confused after the spell, but is confused all of the time, so it is not clear that post-ictal state can be identified. B12. RPR. TSH ok. Family wants SNF.  Concern for seizure. EEG has already been ordered.  Past Medical History  Diagnosis Date  . HTN (hypertension)   . CAD (coronary artery disease)   . Venous insufficiency   . Hypercholesterolemia   . DM (diabetes mellitus)   . Diverticulosis of colon   . Colon polyp   . Adenocarcinoma of breast   . DJD (degenerative joint disease)   . Osteoporosis   . Anxiety disorder     Past Surgical History  Procedure Date  . Cataract extraction   . Mastectomy     for breast cancer    Family History  Problem Relation Age of Onset  . Heart disease Father   . Heart disease Mother   . Alzheimer's disease Sister   . Alzheimer's disease Sister   . Heart attack Sister   . Heart attack Sister     Social History: Per chart-she quit smoking about 25 years ago, does not drink alcohol or use illicit alcohol use.  Allergies  Allergen Reactions  . Nitrofurantoin     REACTION: unsure of reaction--happened long ago  . Tramadol Nausea Only  . Codeine Nausea And Vomiting, Swelling and Rash    Current Facility-Administered Medications  Medication Dose Route Frequency Provider Last Rate Last Dose  . acetaminophen (TYLENOL) tablet 650 mg  650 mg Oral Q6H PRN Joseph Art, DO       Or  .  acetaminophen (TYLENOL) suppository 650 mg  650 mg Rectal Q6H PRN Joseph Art, DO      . carvedilol (COREG) tablet 12.5 mg  12.5 mg Oral BID WC Jessica U Vann, DO   12.5 mg at 08/06/12 1757  . cefTRIAXone (ROCEPHIN) 1 g in dextrose 5 % 50 mL IVPB  1 g Intravenous Q24H Suzi Roots, MD 100 mL/hr at 08/06/12 1240 1 g at 08/06/12 1240  . dextrose 5 % solution   Intravenous Continuous Joseph Art, DO 75 mL/hr at 08/07/12 4098    . enoxaparin (LOVENOX) injection 30 mg  30 mg Subcutaneous Q24H Stephani Police, PA   30 mg at 08/06/12 2101  . feeding supplement (ENSURE) pudding 1 Container  1 Container Oral TID BM Stephani Police, PA   1 Container at 08/06/12 2000  . insulin aspart (novoLOG) injection 0-15 Units  0-15 Units Subcutaneous Q4H Joseph Art, DO   2 Units at 08/07/12 0441  . lisinopril (PRINIVIL,ZESTRIL) tablet 40 mg  40 mg Oral Daily Joseph Art, DO      . ondansetron (ZOFRAN) tablet 4 mg  4 mg Oral Q6H PRN Joseph Art, DO       Or  . ondansetron (ZOFRAN) injection 4 mg  4 mg Intravenous Q6H PRN Selinda Orion  Vann, DO      . sodium chloride 0.9 % injection 3 mL  3 mL Intravenous Q12H Jessica U Vann, DO   3 mL at 08/06/12 0935    ROS: History obtained from chart review and the patients nurse  General ROS: negative for - chills, fatigue, fever, night sweats, weight gain or weight loss Psychological ROS: negative for behavioral disorder, hallucinations, mood swings, suicidal ideation, +memory difficulties, confusion Ophthalmic ROS: negative for - blurry vision, double vision, eye pain or loss of vision ENT ROS: negative for - epistaxis, nasal discharge, oral lesions, sore throat, tinnitus or vertigo Allergy and Immunology ROS: negative for - hives or itchy/watery eyes Hematological and Lymphatic ROS: negative for - bleeding problems, bruising or swollen lymph nodes Endocrine ROS: negative for - galactorrhea, hair pattern changes, polydipsia/polyuria or temperature  intolerance Respiratory ROS: negative for - cough, hemoptysis, shortness of breath or wheezing Cardiovascular ROS: negative for - chest pain, dyspnea on exertion, edema or irregular heartbeat Gastrointestinal ROS: negative for - abdominal pain, diarrhea, hematemesis, nausea/vomiting or stool incontinence Genito-Urinary ROS: negative for - dysuria, hematuria, incontinence or urinary frequency/urgency Musculoskeletal ROS: negative for - joint swelling or muscular weakness Neurological ROS: as noted in HPI Dermatological ROS: negative for rash and skin lesion changes   Physical Examination: Blood pressure 172/82, pulse 82, temperature 98.1 F (36.7 C), temperature source Oral, resp. rate 18, height 5\' 1"  (1.549 m), weight 71.895 kg (158 lb 8 oz), SpO2 97.00%.  Neurologic Examination Mental Status: Alert.  Knows who she is and where she is.  Speech fluent but dysarthric (she does not have false teeth in and has dry mouth).   Cranial Nerves: II: pupils equal, round, reactive to light and accommodation.  Visual fields grossly intact. III,IV, VI: ptosis not present, extra-ocular motions intact bilaterally  V,VII: smile symmetric VIII: hearing normal bilaterally IX,X: gag reflex present XI: trapezius strength/neck flexion strength normal bilaterally XII: tongue strength normal  Motor: Patient can lift all extremities against gravity and maintain.  No focal weakness noted.   Sensory: reacts to painful stimuli in all extremities, but cannot tell me if any are decreased as opposed to others Deep Tendon Reflexes: 2+ in the upper extremities, 1+ at the knees and absent at the ankles Plantars: Right: upgoing   Left: upgoing Cerebellar: Finger to nose intact    Results for orders placed during the hospital encounter of 08/04/12 (from the past 48 hour(s))  TROPONIN I     Status: Normal   Collection Time   08/05/12 11:27 AM      Component Value Range Comment   Troponin I <0.30  <0.30 ng/mL    BASIC METABOLIC PANEL     Status: Abnormal   Collection Time   08/05/12 11:27 AM      Component Value Range Comment   Sodium 144  135 - 145 mEq/L    Potassium 4.1  3.5 - 5.1 mEq/L    Chloride 105  96 - 112 mEq/L    CO2 26  19 - 32 mEq/L    Glucose, Bld 86  70 - 99 mg/dL    BUN 36 (*) 6 - 23 mg/dL    Creatinine, Ser 1.61 (*) 0.50 - 1.10 mg/dL    Calcium 9.6  8.4 - 09.6 mg/dL    GFR calc non Af Amer 39 (*) >90 mL/min    GFR calc Af Amer 45 (*) >90 mL/min   CBC     Status: Abnormal   Collection Time  08/05/12 11:27 AM      Component Value Range Comment   WBC 10.5  4.0 - 10.5 K/uL    RBC 5.14 (*) 3.87 - 5.11 MIL/uL    Hemoglobin 14.6  12.0 - 15.0 g/dL    HCT 16.1  09.6 - 04.5 %    MCV 84.8  78.0 - 100.0 fL    MCH 28.4  26.0 - 34.0 pg    MCHC 33.5  30.0 - 36.0 g/dL    RDW 40.9 (*) 81.1 - 15.5 %    Platelets 114 (*) 150 - 400 K/uL PLATELET COUNT CONFIRMED BY SMEAR  GLUCOSE, CAPILLARY     Status: Normal   Collection Time   08/05/12 11:58 AM      Component Value Range Comment   Glucose-Capillary 76  70 - 99 mg/dL   GLUCOSE, CAPILLARY     Status: Abnormal   Collection Time   08/05/12  4:59 PM      Component Value Range Comment   Glucose-Capillary 122 (*) 70 - 99 mg/dL    Comment 1 Notify RN     GLUCOSE, CAPILLARY     Status: Abnormal   Collection Time   08/05/12  8:22 PM      Component Value Range Comment   Glucose-Capillary 102 (*) 70 - 99 mg/dL   GLUCOSE, CAPILLARY     Status: Abnormal   Collection Time   08/06/12 12:00 AM      Component Value Range Comment   Glucose-Capillary 112 (*) 70 - 99 mg/dL    Comment 1 Documented in Chart      Comment 2 Notify RN     GLUCOSE, CAPILLARY     Status: Abnormal   Collection Time   08/06/12  4:15 AM      Component Value Range Comment   Glucose-Capillary 153 (*) 70 - 99 mg/dL    Comment 1 Documented in Chart      Comment 2 Notify RN     GLUCOSE, CAPILLARY     Status: Normal   Collection Time   08/06/12  8:08 AM      Component  Value Range Comment   Glucose-Capillary 77  70 - 99 mg/dL   CBC     Status: Abnormal   Collection Time   08/06/12  9:00 AM      Component Value Range Comment   WBC 12.6 (*) 4.0 - 10.5 K/uL    RBC 4.55  3.87 - 5.11 MIL/uL    Hemoglobin 13.0  12.0 - 15.0 g/dL    HCT 91.4  78.2 - 95.6 %    MCV 84.8  78.0 - 100.0 fL    MCH 28.6  26.0 - 34.0 pg    MCHC 33.7  30.0 - 36.0 g/dL    RDW 21.3 (*) 08.6 - 15.5 %    Platelets 105 (*) 150 - 400 K/uL CONSISTENT WITH PREVIOUS RESULT  BASIC METABOLIC PANEL     Status: Abnormal   Collection Time   08/06/12  9:00 AM      Component Value Range Comment   Sodium 146 (*) 135 - 145 mEq/L    Potassium 3.7  3.5 - 5.1 mEq/L    Chloride 106  96 - 112 mEq/L    CO2 23  19 - 32 mEq/L    Glucose, Bld 72  70 - 99 mg/dL    BUN 33 (*) 6 - 23 mg/dL    Creatinine, Ser 5.78 (*) 0.50 - 1.10 mg/dL  Calcium 9.5  8.4 - 10.5 mg/dL    GFR calc non Af Amer 41 (*) >90 mL/min    GFR calc Af Amer 48 (*) >90 mL/min   RPR     Status: Normal   Collection Time   08/06/12 11:30 AM      Component Value Range Comment   RPR NON REACTIVE  NON REACTIVE   VITAMIN B12     Status: Abnormal   Collection Time   08/06/12 11:30 AM      Component Value Range Comment   Vitamin B-12 1132 (*) 211 - 911 pg/mL   GLUCOSE, CAPILLARY     Status: Abnormal   Collection Time   08/06/12 12:11 PM      Component Value Range Comment   Glucose-Capillary 166 (*) 70 - 99 mg/dL   GLUCOSE, CAPILLARY     Status: Abnormal   Collection Time   08/06/12  4:27 PM      Component Value Range Comment   Glucose-Capillary 101 (*) 70 - 99 mg/dL    Comment 1 Notify RN     GLUCOSE, CAPILLARY     Status: Abnormal   Collection Time   08/06/12  8:19 PM      Component Value Range Comment   Glucose-Capillary 165 (*) 70 - 99 mg/dL    Comment 1 Notify RN      Comment 2 Documented in Chart     GLUCOSE, CAPILLARY     Status: Abnormal   Collection Time   08/06/12 11:38 PM      Component Value Range Comment    Glucose-Capillary 108 (*) 70 - 99 mg/dL    Comment 1 Notify RN      Comment 2 Documented in Chart     GLUCOSE, CAPILLARY     Status: Abnormal   Collection Time   08/07/12  4:15 AM      Component Value Range Comment   Glucose-Capillary 133 (*) 70 - 99 mg/dL    Comment 1 Notify RN      Comment 2 Documented in Chart     CBC     Status: Abnormal   Collection Time   08/07/12  5:33 AM      Component Value Range Comment   WBC 11.0 (*) 4.0 - 10.5 K/uL    RBC 3.72 (*) 3.87 - 5.11 MIL/uL    Hemoglobin 10.6 (*) 12.0 - 15.0 g/dL DELTA CHECK NOTED   HCT 32.0 (*) 36.0 - 46.0 %    MCV 86.0  78.0 - 100.0 fL    MCH 28.5  26.0 - 34.0 pg    MCHC 33.1  30.0 - 36.0 g/dL    RDW 14.7 (*) 82.9 - 15.5 %    Platelets 97 (*) 150 - 400 K/uL CONSISTENT WITH PREVIOUS RESULT  BASIC METABOLIC PANEL     Status: Abnormal   Collection Time   08/07/12  5:33 AM      Component Value Range Comment   Sodium 150 (*) 135 - 145 mEq/L    Potassium 3.5  3.5 - 5.1 mEq/L    Chloride 110  96 - 112 mEq/L    CO2 24  19 - 32 mEq/L    Glucose, Bld 131 (*) 70 - 99 mg/dL    BUN 33 (*) 6 - 23 mg/dL    Creatinine, Ser 5.62 (*) 0.50 - 1.10 mg/dL    Calcium 9.4  8.4 - 13.0 mg/dL    GFR calc non Af Amer 36 (*) >  90 mL/min    GFR calc Af Amer 42 (*) >90 mL/min   GLUCOSE, CAPILLARY     Status: Abnormal   Collection Time   08/07/12  7:41 AM      Component Value Range Comment   Glucose-Capillary 119 (*) 70 - 99 mg/dL     Recent Results (from the past 240 hour(s))  URINE CULTURE     Status: Normal   Collection Time   08/04/12 11:15 AM      Component Value Range Status Comment   Specimen Description URINE, CATHETERIZED   Final    Special Requests NONE   Final    Culture  Setup Time 08/04/2012 18:59   Final    Colony Count >=100,000 COLONIES/ML   Final    Culture ESCHERICHIA COLI   Final    Report Status 08/06/2012 FINAL   Final    Organism ID, Bacteria ESCHERICHIA COLI   Final   CULTURE, BLOOD (ROUTINE X 2)     Status: Normal  (Preliminary result)   Collection Time   08/04/12 11:30 AM      Component Value Range Status Comment   Specimen Description BLOOD LEFT HAND   Final    Special Requests BOTTLES DRAWN AEROBIC AND ANAEROBIC 10CC   Final    Culture  Setup Time 08/04/2012 18:44   Final    Culture     Final    Value:        BLOOD CULTURE RECEIVED NO GROWTH TO DATE CULTURE WILL BE HELD FOR 5 DAYS BEFORE ISSUING A FINAL NEGATIVE REPORT   Report Status PENDING   Incomplete   CULTURE, BLOOD (ROUTINE X 2)     Status: Normal (Preliminary result)   Collection Time   08/04/12  1:15 PM      Component Value Range Status Comment   Specimen Description BLOOD LEFT HAND   Final    Special Requests BOTTLES DRAWN AEROBIC AND ANAEROBIC 5CCC   Final    Culture  Setup Time 08/04/2012 18:44   Final    Culture     Final    Value:        BLOOD CULTURE RECEIVED NO GROWTH TO DATE CULTURE WILL BE HELD FOR 5 DAYS BEFORE ISSUING A FINAL NEGATIVE REPORT   Report Status PENDING   Incomplete     Mr Brain Wo Contrast 08/05/2012  *RADIOLOGY REPORT*  Clinical Data: Unsteady gait.  Garbled speech.  Right-sided facial droop.  Diabetic.  MRI HEAD WITHOUT CONTRAST  Technique:  Multiplanar, multiecho pulse sequences of the brain and surrounding structures were obtained according to standard protocol without intravenous contrast.  Comparison: 08/04/2012 CT.  No comparison MR.  Findings: No acute infarct.  No intracranial hemorrhage.  Moderate to marked small vessel disease type changes.  Global atrophy without hydrocephalus.  No intracranial mass lesion detected on this unenhanced exam.  Major intracranial vascular structures are patent.  IMPRESSION: No acute infarct.  Moderate to marked small vessel disease type changes.   Original Report Authenticated By: Lacy Duverney, M.D.    Dg Chest Port 1 View 08/06/2012  *RADIOLOGY REPORT*  Clinical Data: Shortness of breath, cough.  PORTABLE CHEST - 1 VIEW  Comparison: 08/04/2012  Findings: Bibasilar airspace  opacities, likely atelectasis.  Small layering effusions bilaterally.  Heart is borderline in size. Diffuse peribronchial thickening.  Stable chronic interstitial prominence. No acute bony abnormality.  IMPRESSION: Stable chronic bronchitic changes and interstitial disease.  Bibasilar atelectasis with small bilateral effusions.   Original  Report Authenticated By: Charlett Nose, M.D.     Job Founds, MBA, MHA [facility] Pager [phone]    Patient seen and examined.  Clinical course and management discussed.  Necessary edits performed.  I agree with the above.  Assessment and plan of care developed and discussed below.    Assessment/Plan: 79 year old female presenting with altered mental status and UTI.  She initially improved but now again has a worse than baseline mental status.  Patient does have a dementia at baseline.  MRI of the brain shows no acute changes.  Antibiotics have been initiated but patient continues to have an elevated wbc count, sodium is increasing and renal function is impaired above baseline.  Mental status issues likely metabolic superimposed on a less than optimal cognitive baseline.  With improved metabolic situation do expect mental status to improve.  Her cognition will lag behind her numbers.  It should also be noted that it is not unheard of for the elderly with dementia at baseline to not return completely to baseline even with time after a serious infection.    Recommendations: 1.  Will follow up EEG 2.  No antiepileptics at this time.   3.  Agree with continued management of infection and metabolic issues.    Case discussed with Dr. Constance Holster, MD [facility] [phone]  08/07/2012  12:19 PM

## 2012-08-08 DIAGNOSIS — J9600 Acute respiratory failure, unspecified whether with hypoxia or hypercapnia: Secondary | ICD-10-CM

## 2012-08-08 DIAGNOSIS — K1370 Unspecified lesions of oral mucosa: Secondary | ICD-10-CM

## 2012-08-08 DIAGNOSIS — J189 Pneumonia, unspecified organism: Secondary | ICD-10-CM

## 2012-08-08 DIAGNOSIS — M545 Low back pain: Secondary | ICD-10-CM

## 2012-08-08 LAB — COMPREHENSIVE METABOLIC PANEL
AST: 32 U/L (ref 0–37)
Albumin: 3.4 g/dL — ABNORMAL LOW (ref 3.5–5.2)
Alkaline Phosphatase: 55 U/L (ref 39–117)
Chloride: 106 mEq/L (ref 96–112)
Creatinine, Ser: 1.17 mg/dL — ABNORMAL HIGH (ref 0.50–1.10)
Potassium: 3.5 mEq/L (ref 3.5–5.1)
Total Bilirubin: 1.2 mg/dL (ref 0.3–1.2)
Total Protein: 6.6 g/dL (ref 6.0–8.3)

## 2012-08-08 LAB — CBC
Platelets: 82 10*3/uL — ABNORMAL LOW (ref 150–400)
RBC: 4.48 MIL/uL (ref 3.87–5.11)
RDW: 18.2 % — ABNORMAL HIGH (ref 11.5–15.5)
WBC: 17.7 10*3/uL — ABNORMAL HIGH (ref 4.0–10.5)

## 2012-08-08 LAB — GLUCOSE, CAPILLARY
Glucose-Capillary: 129 mg/dL — ABNORMAL HIGH (ref 70–99)
Glucose-Capillary: 135 mg/dL — ABNORMAL HIGH (ref 70–99)

## 2012-08-08 LAB — PRO B NATRIURETIC PEPTIDE: Pro B Natriuretic peptide (BNP): 5467 pg/mL — ABNORMAL HIGH (ref 0–450)

## 2012-08-08 MED ORDER — BISACODYL 10 MG RE SUPP: 10.0000 mg | Freq: Every day | RECTAL | Status: DC | PRN

## 2012-08-08 MED ORDER — ALBUTEROL SULFATE (5 MG/ML) 0.5% IN NEBU: 5.0000 mg | INHALATION_SOLUTION | Freq: Four times a day (QID) | RESPIRATORY_TRACT | Status: DC | PRN

## 2012-08-08 MED ORDER — ATROPINE SULFATE 1 % OP SOLN
2.0000 [drp] | OPHTHALMIC | Status: DC | PRN
  Filled 2012-08-08: qty 2

## 2012-08-08 MED ORDER — PIPERACILLIN-TAZOBACTAM 3.375 G IVPB
3.3750 g | Freq: Three times a day (TID) | INTRAVENOUS | Status: DC
  Administered 2012-08-08 – 2012-08-11 (×10): 3.375 g via INTRAVENOUS
  Filled 2012-08-08 (×12): qty 50

## 2012-08-08 MED ORDER — VANCOMYCIN HCL 1000 MG IV SOLR
750.0000 mg | INTRAVENOUS | Status: DC
  Administered 2012-08-08 – 2012-08-10 (×3): 750 mg via INTRAVENOUS
  Filled 2012-08-08 (×4): qty 750

## 2012-08-08 NOTE — Progress Notes (Signed)
Subjective: Patient unchanged today.  EEG shows no evidence of epileptiform activity.  MRI shows no evidence of acute infarcts.  Objective: Current vital signs: BP 147/99  Pulse 67  Temp 99.1 F (37.3 C) (Axillary)  Resp 18  Ht 5\' 3"  (1.6 m)  Wt 73.5 kg (162 lb 0.6 oz)  BMI 28.70 kg/m2  SpO2 96% Vital signs in last 24 hours: Temp:  [98.9 F (37.2 C)-99.8 F (37.7 C)] 99.1 F (37.3 C) (01/02 0800) Pulse Rate:  [67-87] 67  (01/02 1041) Resp:  [18-24] 18  (01/02 1041) BP: (94-161)/(67-99) 147/99 mmHg (01/02 0800) SpO2:  [82 %-100 %] 96 % (01/02 1041) FiO2 (%):  [35 %] 35 % (01/02 0400) Weight:  [73.5 kg (162 lb 0.6 oz)] 73.5 kg (162 lb 0.6 oz) (01/02 0500)  Intake/Output from previous day: 01/01 0701 - 01/02 0700 In: 1721.3 [I.V.:1321.3; IV Piggyback:400] Out: 425 [Urine:425] Intake/Output this shift: Total I/O In: 150 [I.V.:150] Out: 300 [Urine:300] Nutritional status: Dysphagia  Neurologic Exam: Mental Status:  Alert. Knows who she is and where she is. Speech fluent but dysarthric (she does not have false teeth in and has dry mouth).  Cranial Nerves:  II: pupils equal, round, reactive to light and accommodation. Visual fields grossly intact.  III,IV, VI: ptosis not present, extra-ocular motions intact bilaterally  V,VII: smile symmetric  VIII: hearing normal bilaterally  IX,X: gag reflex present  XI: trapezius strength/neck flexion strength normal bilaterally  XII: tongue strength normal  Motor:  Patient can lift all extremities against gravity and maintain. No focal weakness noted.  Sensory: reacts to painful stimuli in all extremities, but cannot tell me if any are decreased as opposed to others  Deep Tendon Reflexes: 2+ in the upper extremities, 1+ at the knees and absent at the ankles  Plantars:  Right: upgoing    Left: upgoing  Cerebellar:  Finger to nose intact   Lab Results: Basic Metabolic Panel:  Lab 08/08/12 1610 08/07/12 0533 08/06/12 0900  08/05/12 1127 08/04/12 1118  NA 146* 150* 146* 144 143  K 3.5 3.5 3.7 4.1 3.3*  CL 106 110 106 105 105  CO2 25 24 23 26 23   GLUCOSE 156* 131* 72 86 123*  BUN 30* 33* 33* 36* 42*  CREATININE 1.17* 1.26* 1.12* 1.18* 1.20*  CALCIUM 9.4 9.4 9.5 -- --  MG -- -- -- -- --  PHOS -- -- -- -- --    Liver Function Tests:  Lab 08/08/12 0846 08/04/12 1118  AST 32 23  ALT 31 20  ALKPHOS 55 42  BILITOT 1.2 0.8  PROT 6.6 5.5*  ALBUMIN 3.4* 3.0*   No results found for this basename: LIPASE:5,AMYLASE:5 in the last 168 hours No results found for this basename: AMMONIA:3 in the last 168 hours  CBC:  Lab 08/08/12 0846 08/07/12 0533 08/06/12 0900 08/05/12 1127 08/04/12 1118  WBC 17.7* 11.0* 12.6* 10.5 9.8  NEUTROABS -- -- -- -- --  HGB 13.0 10.6* 13.0 14.6 12.7  HCT 39.1 32.0* 38.6 43.6 37.1  MCV 87.3 86.0 84.8 84.8 84.3  PLT 82* 97* 105* 114* 64*    Cardiac Enzymes:  Lab 08/05/12 1127 08/04/12 2135 08/04/12 1511 08/04/12 1119  CKTOTAL -- -- 60 --  CKMB -- -- -- --  CKMBINDEX -- -- -- --  TROPONINI <0.30 <0.30 -- <0.30    Lipid Panel: No results found for this basename: CHOL:5,TRIG:5,HDL:5,CHOLHDL:5,VLDL:5,LDLCALC:5 in the last 168 hours  CBG:  Lab 08/08/12 0753 08/08/12 0025 08/07/12 2000 08/07/12  1712 08/07/12 1154  GLUCAP 135* 129* 158* 107* 157*    Microbiology: Results for orders placed during the hospital encounter of 08/04/12  URINE CULTURE     Status: Normal   Collection Time   08/04/12 11:15 AM      Component Value Range Status Comment   Specimen Description URINE, CATHETERIZED   Final    Special Requests NONE   Final    Culture  Setup Time 08/04/2012 18:59   Final    Colony Count >=100,000 COLONIES/ML   Final    Culture ESCHERICHIA COLI   Final    Report Status 08/06/2012 FINAL   Final    Organism ID, Bacteria ESCHERICHIA COLI   Final   CULTURE, BLOOD (ROUTINE X 2)     Status: Normal (Preliminary result)   Collection Time   08/04/12 11:30 AM      Component  Value Range Status Comment   Specimen Description BLOOD LEFT HAND   Final    Special Requests BOTTLES DRAWN AEROBIC AND ANAEROBIC 10CC   Final    Culture  Setup Time 08/04/2012 18:44   Final    Culture     Final    Value:        BLOOD CULTURE RECEIVED NO GROWTH TO DATE CULTURE WILL BE HELD FOR 5 DAYS BEFORE ISSUING A FINAL NEGATIVE REPORT   Report Status PENDING   Incomplete   CULTURE, BLOOD (ROUTINE X 2)     Status: Normal (Preliminary result)   Collection Time   08/04/12  1:15 PM      Component Value Range Status Comment   Specimen Description BLOOD LEFT HAND   Final    Special Requests BOTTLES DRAWN AEROBIC AND ANAEROBIC 5CCC   Final    Culture  Setup Time 08/04/2012 18:44   Final    Culture     Final    Value:        BLOOD CULTURE RECEIVED NO GROWTH TO DATE CULTURE WILL BE HELD FOR 5 DAYS BEFORE ISSUING A FINAL NEGATIVE REPORT   Report Status PENDING   Incomplete   MRSA PCR SCREENING     Status: Normal   Collection Time   08/08/12 12:53 AM      Component Value Range Status Comment   MRSA by PCR NEGATIVE  NEGATIVE Final     Coagulation Studies: No results found for this basename: LABPROT:5,INR:5 in the last 72 hours  Imaging: Dg Chest Port 1 View  08/08/2012  *RADIOLOGY REPORT*  Clinical Data: Worsening O2 saturation.  PORTABLE CHEST - 1 VIEW  Comparison: Chest radiograph performed 08/06/2012  Findings: Since the recent prior study, there has been interval development of right upper and lower lung zone airspace opacification, with persistent left basilar airspace opacity, compatible with worsening multifocal pneumonia.  A small left pleural effusion is again seen.  No pneumothorax is identified.  The cardiomediastinal silhouette is borderline normal in size. Calcification is noted in the aortic arch.  No acute osseous abnormalities are identified.  IMPRESSION: Significantly worsening bilateral pneumonia noted; small left pleural effusion seen.  These results were called by telephone on  08/08/2012 at 12:03 a.m. to [facility] on [id number], who verbally acknowledged these results.   Original Report Authenticated By: Tonia Ghent, M.D.     Medications:  I have reviewed the patient's current medications. Scheduled:   . carvedilol  12.5 mg Oral BID WC  . enoxaparin (LOVENOX) injection  30 mg Subcutaneous Q24H  . feeding supplement  1  Container Oral TID BM  . insulin aspart  0-15 Units Subcutaneous Q4H  . lisinopril  40 mg Oral Daily  . piperacillin-tazobactam (ZOSYN)  IV  3.375 g Intravenous Q8H  . sodium chloride  3 mL Intravenous Q12H  . vancomycin  750 mg Intravenous Q24H    Assessment/Plan:  Patient Active Hospital Problem List: Altered mental state (08/04/2012)   Assessment: In work up no evidence of seizure or stroke.  Metabolic encephalopathy likely etiology.  This was discussed with family.     Plan: No further neurologic intervention is recommended at this time.  If further questions arise, please call or page at that time.  Thank you for allowing neurology to participate in the care of this patient.  Thana Farr, MD [facility] [phone] 08/08/2012  11:47 AM     LOS: 4 days

## 2012-08-08 NOTE — Progress Notes (Signed)
Patients Grandson states " I don't want her to be intubated, meds only." K. Schorr, NP on call text paged to notify and Nurse on [name] also notified. [facility]

## 2012-08-08 NOTE — Clinical Social Work Placement (Addendum)
     Clinical Social Work Department CLINICAL SOCIAL WORK PLACEMENT NOTE 08/15/2012  Patient:  Audrey Reed, Audrey Reed  Account Number:  [id number] Admit date:  08/04/2012  Clinical Social Worker:  Theresia Bough, Theresia Majors  Date/time:  08/08/2012 02:51 PM  Clinical Social Work is seeking post-discharge placement for this patient at the following level of care:   SKILLED NURSING   (*CSW will update this form in Epic as items are completed)   08/08/2012  Patient/family provided with [facility] Department of Clinical Social Works list of facilities offering this level of care within the geographic area requested by the patient (or if unable, by the patients family).  08/08/2012  Patient/family informed of their freedom to choose among providers that offer the needed level of care, that participate in Medicare, Medicaid or managed care program needed by the patient, have an available bed and are willing to accept the patient.  08/08/2012  Patient/family informed of [facility] ownership interest in [facility], as well as of the fact that they are under no obligation to receive care at this facility.  PASARR submitted to EDS on 08/08/2012 PASARR number received from EDS on 08/08/2012  FL2 transmitted to all facilities in geographic area requested by pt/family on  08/08/2012 FL2 transmitted to all facilities within larger geographic area on   Patient informed that his/her managed care company has contracts with or will negotiate with  certain facilities, including the following:     Patient/family informed of bed offers received:  08/14/2012 Patient chooses bed at [facility], [place] Physician recommends and patient chooses bed at    [facility] [place] on  08/15/2012 Patient to be transferred to facility by EMS  The following physician request were entered in Epic:   Additional Comments:

## 2012-08-08 NOTE — Progress Notes (Signed)
Clinical Child psychotherapist (CSW) contacted pt grandson Marisa Hage to discuss SNF placement. Pt grandson confirmed that family is agreeable to placement and would prefer [facility] [place] as it is closest to pt son. CSW encouraged pt grandson to consider a backup facility in case [facility] is not able to offer LT placement. CSW also provided grandson with education regarding Medicaid as pt insurance will not pay for LT care. Pt grandson appreciative and will provide pt son with this information.   CSW has received consent to fax pt out. CSW to also submit for a pasarr.  Theresia Bough, MSW, Theresia Majors [phone]

## 2012-08-08 NOTE — Progress Notes (Signed)
Palliative Medicine Team consult for goals of care requested by Dr Jerral Ralph; spoke with grandson Vincenza Hews and granddtr Inetta Fermo at bedside, son [name] on way to hospital confirmed meeting time for today, Thursday 1/2 @ 11:00 am  Valente David, RN 08/08/2012, 9:25 AM Palliative Medicine Team RN Liaison [phone]

## 2012-08-08 NOTE — Progress Notes (Signed)
ANTIBIOTIC CONSULT NOTE - INITIAL  Pharmacy Consult for Vancomycin + Zosyn Indication: rule out pneumonia  Allergies  Allergen Reactions  . Nitrofurantoin     REACTION: unsure of reaction--happened long ago  . Tramadol Nausea Only  . Codeine Nausea And Vomiting, Swelling and Rash    Patient Measurements: Height: 5\' 1"  (154.9 cm) Weight: 158 lb 8 oz (71.895 kg) IBW/kg (Calculated) : 47.8   Vital Signs: Temp: 98.9 F (37.2 C) (01/01 2341) Temp src: Axillary (01/01 2341) BP: 154/81 mmHg (01/02 0000) Pulse Rate: 76  (01/02 0000) Intake/Output from previous day: 01/01 0701 - 01/02 0700 In: 1071.3 [I.V.:871.3; IV Piggyback:200] Out: -  Intake/Output from this shift: Total I/O In: 225 [I.V.:225] Out: -   Labs:  Basename 08/07/12 0533 08/06/12 0900 08/05/12 1127  WBC 11.0* 12.6* 10.5  HGB 10.6* 13.0 14.6  PLT 97* 105* 114*  LABCREA -- -- --  CREATININE 1.26* 1.12* 1.18*   Estimated Creatinine Clearance: 25.8 ml/min (by C-G formula based on Cr of 1.26). No results found for this basename: VANCOTROUGH:2,VANCOPEAK:2,VANCORANDOM:2,GENTTROUGH:2,GENTPEAK:2,GENTRANDOM:2,TOBRATROUGH:2,TOBRAPEAK:2,TOBRARND:2,AMIKACINPEAK:2,AMIKACINTROU:2,AMIKACIN:2, in the last 72 hours   Microbiology: Recent Results (from the past 720 hour(s))  URINE CULTURE     Status: Normal   Collection Time   08/04/12 11:15 AM      Component Value Range Status Comment   Specimen Description URINE, CATHETERIZED   Final    Special Requests NONE   Final    Culture  Setup Time 08/04/2012 18:59   Final    Colony Count >=100,000 COLONIES/ML   Final    Culture ESCHERICHIA COLI   Final    Report Status 08/06/2012 FINAL   Final    Organism ID, Bacteria ESCHERICHIA COLI   Final   CULTURE, BLOOD (ROUTINE X 2)     Status: Normal (Preliminary result)   Collection Time   08/04/12 11:30 AM      Component Value Range Status Comment   Specimen Description BLOOD LEFT HAND   Final    Special Requests BOTTLES DRAWN  AEROBIC AND ANAEROBIC 10CC   Final    Culture  Setup Time 08/04/2012 18:44   Final    Culture     Final    Value:        BLOOD CULTURE RECEIVED NO GROWTH TO DATE CULTURE WILL BE HELD FOR 5 DAYS BEFORE ISSUING A FINAL NEGATIVE REPORT   Report Status PENDING   Incomplete   CULTURE, BLOOD (ROUTINE X 2)     Status: Normal (Preliminary result)   Collection Time   08/04/12  1:15 PM      Component Value Range Status Comment   Specimen Description BLOOD LEFT HAND   Final    Special Requests BOTTLES DRAWN AEROBIC AND ANAEROBIC 5CCC   Final    Culture  Setup Time 08/04/2012 18:44   Final    Culture     Final    Value:        BLOOD CULTURE RECEIVED NO GROWTH TO DATE CULTURE WILL BE HELD FOR 5 DAYS BEFORE ISSUING A FINAL NEGATIVE REPORT   Report Status PENDING   Incomplete     Medical History: Past Medical History  Diagnosis Date  . HTN (hypertension)   . CAD (coronary artery disease)   . Venous insufficiency   . Hypercholesterolemia   . DM (diabetes mellitus)   . Diverticulosis of colon   . Colon polyp   . Adenocarcinoma of breast   . DJD (degenerative joint disease)   . Osteoporosis   .  Anxiety disorder     Assessment: 79 y.o. F brought to [facility] on 12/29 after being found down by her family. The patient was currently being treated with Rocephin for an E.coli UTI this admission. Yesterday, the patient was noted to have moderate thick white/pink tinged secretions and drops in O2 sats. CXR this morning shows worsening bilteral PNA and a small left pleural effusion. Rocephin was discontinued and pharmacy was consulted to start Vancomycin + Zosyn for empiric HCAP coverage. Wt: 71.9 kg, SCr 1.26, estimated CrCl~25-30 ml/min.   Goal of Therapy:  Vancomycin trough level 15-20 mcg/ml Proper antibiotics for infection/cultures adjusted for renal/hepatic function    Plan:  1. Zosyn 3.375g IV every 8 hours 2. Vancomycin 750 mg IV every 24 hours 3. Will continue to follow renal function, culture  results, LOT, and antibiotic de-escalation plans   Georgina Pillion, PharmD, BCPS Clinical Pharmacist Pager: [phone] 08/08/2012 2:05 AM

## 2012-08-08 NOTE — Progress Notes (Signed)
Event: 2315:  Notified by RN that pt noted w/ 02 sats of 72% on 3L [place] during a routine VS check. Pt placed on NRB at 100% ans sats returned to WNL. Mildly increased WOB w/ audible secretions pt unable to bring up w/ coughing. RT in to perform nasotracheal suctioning. Rapid Response RN called and is currently at bedside. Stat ABG ordered and NP to bedside.  Subjective: Pt unable to provide specific information. Appears somewhat confused which RN reports is not new.  Objective: Ms. Villeda is a 79 y/o well developed elderly female admitted on 08/04/12 for AMS that was felt to be related to an infectious process (UTI). CT and MRI brain neg for acute process. At bedside pt noted awake though somewhat lethargic. Does respond and track. Skin w/d, color normal. Mildly increased WOB (22-24). BBS notable for rhonchi. Current VS, T-98.9, BP-100/78. P-73, R-22 w/ current 02 sats of 98% on Venti-mask at 50%. ABG reveals pH-7.37, pC02-46.5. p02 82.5 and bicarb 26.3. PCXR reveals "worsening bilateral PNA w/ small (L) pleural effusion". Last CXR on 08/06/12 and was w/o acute findings.  Assessment/Plan: 1. Hypoxia: (Resolved) CXR findings c/w PNA. Given that this is the 3rd day of hospitalization and previous CXR's w/o indication for PNA will treat for HAP. Pt is currently on Rocephin. Will d/c Rocephin and start Vanc/Zosyn. Will start PRN nebs. Will transfer to SDU for close monitoring. Discussed pt w/ Dr Haroldine Laws who is in agreement w/ plan. RN on 5500 notified son of need for transfer. He reported to RN that he does want pt treated but would not want her intubated if her condition continued to deteriorate.  Reassement: 0300: Nursing has titrated 02 down to 35% from 50% VM. 02 sats 94-97%. Has been transferred to SDU.  Has rested in NAD since transfer. Will continue to monitor closely.  Leanne Chang, NP-C [facility] Pager [phone]

## 2012-08-08 NOTE — Consult Note (Signed)
[name]      DOB: [birth year]      EAV:[id number]     Consult Note from the Palliative Medicine Team at [facility]    Consult Requested by:Dr Ghimire     PCP: Audrey Mcalpine, MD Reason for Consultation:Goals of Care     Phone Number:[phone]  Assessment of patients Current state: On 08/08/12 patient's oxygen saturation dropped to low 70's on 3 liters of oxygen via nasal cannula, transitioned to venti mask, as needed nebulizers, saturations improved to 90's, transferred to SDU. Currently on nasal cannula, maintaining saturations. Overall patient is semi-lethargic, respirations unlabored,rate 16-18/min, no accessory muscle use, no audible upper airway secretions, has required some naso-tracheal suctioning. Appetite minimal at present.   Palliative care meeting completed to discuss Goals of Care with the following family present: patients son, Jamarie Joplin (C# [phone]), grandson, Glendell Docker (C# [phone]) and grand-daughter, Barnet Glasgow [phone]). Patient was semi-lethargic, opened eyes to verbal stimuli, able to answer simple questions, responses slow.   We discussed patients decline in baseline status, which was fairly independent prior to hospitalization,she cared for all personal hygiene needs, cooked all meals and was actually driving a vehicle up until last week. Over past 4 weeks family has noticed patient to have increased forgetfulness and declining appetite. We talked about the fact that patients ability to sustain adequate oral food/fluids will have to be closely followed. Family informed that Goals of Care may have to be readressed if patient is unable to sustain nutrtional status orally, they verbalized their understanding.   Discussed the philosophy of palliative care and hospice support and services provided. Also engaged in decision with family regarding concepts of Medical Orders for Scope of Treatment pertaining to cardiac and pulmonary resuscitation, desire  for acute future interventions for acute issues, the use of antibiotic therapy, intravenous hydration and continuing artificial tube feedings. A MOST form was completed, signed by patients son [name], and placed on chart, along with DNR [place] form.  1)Goals of Care:  Current Goals of Care as follows:  1) Code Status: DNR/DNI, do not want aggressive measures in event of cardiac/pulmonary arrest  2) Want to continue to treat the treatable medical issues identified at this time, continue lab draws,CBG/ telemetry monitoring, diagnostic radiology testing, IV fluids, antibiotics, respiratory oxygen and medication support.  3) Would want to have patient brought to the ER for evaluation and acute issues that could be medically managed at this time  4) Future treatment with antibiotics for identified infections as well as intravenous fluid for defined period of time as indicated  5) DO NOT want to pursue artificial tube feedings for artificial nutrition support.  6) Agreeable for plan to discharge to SNF/rehabilitation with Palliative care services to follow.     2. Scope of Treatment: 1. Vital Signs: continue monitoring  2. Respiratory/Oxygen: continue current respiratory support 3. Nutritional Support/Tube Feeds: do not want artificial tube feeding  4. Antibiotics:continue to treat current infections 5. Blood Products: as needed 6. IVF: use for defined period of times 7. Labs: continue lab testing 8. Telemetry: continue   4. Disposition: recommend discharge to SNF/rehabilitation with Palliative care services to follow   3. Symptom Management:   1. Pain: denies pain presently, Tylenol as needed for mild pain 2. Bowel Regimen: Dulcolax suppository as needed 3. Fever: Tylenol as needed 4. Increased Oropharyngeal Secretions: ordered Atropine SL as needed  4. Psychosocial: Patient independently functional in her own home prior to admission, family stated that current decline  was very  abrupt. Supported them in current decisions, but also made them aware that if oral food/fluid intake did not improve that direction of future care may have to change to full comfort/hospice support.   5. Spiritual:has community family pastor, declined hospital chaplin    Patient Documents Completed or Given: Document Given Completed  Advanced Directives Pkt    MOST  YES  DNR  YES  Gone from My Sight    Hard Choices  YES    Brief HPI: Audrey Reed is a 79 yo WF that lived at home alone, brought to ER 08/04/12 found on the floor of her home with AMS changes brought to ER for evaluation. Admitted and currently being treated for UTI and PNA. SLP evaluation completed, noted to have difficulty with ingestion of solid foods, currently on Dysphagia 1 Diet. Per staff patient taking some oral liquids, but minimal mount of total meals.   ROS: +denies pain, n/v (unable to elicit full ROS due to patients lethargy)    PMH:  Past Medical History  Diagnosis Date  . HTN (hypertension)   . CAD (coronary artery disease)   . Venous insufficiency   . Hypercholesterolemia   . DM (diabetes mellitus)   . Diverticulosis of colon   . Colon polyp   . Adenocarcinoma of breast   . DJD (degenerative joint disease)   . Osteoporosis   . Anxiety disorder      PSH: Past Surgical History  Procedure Date  . Cataract extraction   . Mastectomy     for breast cancer   I have reviewed the FH and SH and  If appropriate update it with new information. Allergies  Allergen Reactions  . Nitrofurantoin     REACTION: unsure of reaction--happened long ago  . Tramadol Nausea Only  . Codeine Nausea And Vomiting, Swelling and Rash   Scheduled Meds:   . carvedilol  12.5 mg Oral BID WC  . enoxaparin (LOVENOX) injection  30 mg Subcutaneous Q24H  . feeding supplement  1 Container Oral TID BM  . insulin aspart  0-15 Units Subcutaneous Q4H  . lisinopril  40 mg Oral Daily  . piperacillin-tazobactam (ZOSYN)  IV   3.375 g Intravenous Q8H  . sodium chloride  3 mL Intravenous Q12H  . vancomycin  750 mg Intravenous Q24H   Continuous Infusions:   . dextrose 75 mL/hr at 08/08/12 1234   PRN Meds:.acetaminophen, acetaminophen, albuterol, atropine, bisacodyl, ondansetron (ZOFRAN) IV, ondansetron    BP 147/99  Pulse 67  Temp 99.1 F (37.3 C) (Axillary)  Resp 18  Ht 5\' 3"  (1.6 m)  Wt 73.5 kg (162 lb 0.6 oz)  BMI 28.70 kg/m2  SpO2 96%   PPS:40%       Intake/Output Summary (Last 24 hours) at 08/08/12 1250 Last data filed at 08/08/12 1136  Gross per 24 hour  Intake 2021.25 ml  Output    725 ml  Net 1296.25 ml   LBM:08/08/12 (per nursing staff)                  Physical Exam:  General: Semi-lethargic, but arousable to verbal stimuli HEENT:  Anicteric, edentulous, buccal mucosa moist Chest:  Coarse scattered rhonchi throughout CVS: RRR Abdomen:soft, non-tender, BS audible GU: indwelling foley d/w clear amber urine Ext: no mottling, trace pedal edema Neuro: able to move extremities  Labs: CBC    Component Value Date/Time   WBC 17.7* 08/08/2012 0846   WBC 9.3 01/12/2010 1115   RBC 4.48  08/08/2012 0846   RBC 4.49 01/12/2010 1115   HGB 13.0 08/08/2012 0846   HGB 13.3 01/12/2010 1115   HCT 39.1 08/08/2012 0846   HCT 39.2 01/12/2010 1115   PLT 82* 08/08/2012 0846   PLT 184 01/12/2010 1115   MCV 87.3 08/08/2012 0846   MCV 87.3 01/12/2010 1115   MCH 29.0 08/08/2012 0846   MCH 29.6 01/12/2010 1115   MCHC 33.2 08/08/2012 0846   MCHC 33.9 01/12/2010 1115   RDW 18.2* 08/08/2012 0846   RDW 14.4 01/12/2010 1115   LYMPHSABS 2.2 04/22/2012 1045   LYMPHSABS 1.9 01/12/2010 1115   MONOABS 0.8 04/22/2012 1045   MONOABS 0.9 01/12/2010 1115   EOSABS 0.4 04/22/2012 1045   EOSABS 0.7* 01/12/2010 1115   BASOSABS 0.1 04/22/2012 1045   BASOSABS 0.0 01/12/2010 1115    BMET    Component Value Date/Time   NA 146* 08/08/2012 0846   K 3.5 08/08/2012 0846   CL 106 08/08/2012 0846   CO2 25 08/08/2012 0846   GLUCOSE 156* 08/08/2012 0846   BUN 30*  08/08/2012 0846   CREATININE 1.17* 08/08/2012 0846   CALCIUM 9.4 08/08/2012 0846   GFRNONAA 39* 08/08/2012 0846   GFRAA 45* 08/08/2012 0846    CMP     Component Value Date/Time   NA 146* 08/08/2012 0846   K 3.5 08/08/2012 0846   CL 106 08/08/2012 0846   CO2 25 08/08/2012 0846   GLUCOSE 156* 08/08/2012 0846   BUN 30* 08/08/2012 0846   CREATININE 1.17* 08/08/2012 0846   CALCIUM 9.4 08/08/2012 0846   PROT 6.6 08/08/2012 0846   ALBUMIN 3.4* 08/08/2012 0846   AST 32 08/08/2012 0846   ALT 31 08/08/2012 0846   ALKPHOS 55 08/08/2012 0846   BILITOT 1.2 08/08/2012 0846   GFRNONAA 39* 08/08/2012 0846   GFRAA 45* 08/08/2012 0846    Chest Xray Reviewed/Impressions:08/08/12 IMPRESSION:  Significantly worsening bilateral pneumonia noted; small left  pleural effusion seen.    MRI scan of Head:08/05/12 Findings: No acute infarct.  No intracranial hemorrhage.  Moderate to marked small vessel disease type changes.  Global atrophy without hydrocephalus.  No intracranial mass lesion detected on this unenhanced exam.  Major intracranial vascular structures are patent.  Time In Time Out Total Time Spent with Patient Total Overall Time  11:00a 12:15p 15 min 75 min    Greater than 50%  of this time was spent counseling and coordinating care related to the above assessment and plan.  Freddie Breech, CNS-C Palliative Medicine Team [facility] Health Team Phone: [phone] Pager: [phone]

## 2012-08-08 NOTE — Progress Notes (Signed)
Patient AV:WUJW O Dieu      DOB: 12-17-1917      JXB:[id number]  Mrs. Mccuiston is a 79 yo WF that lived at home alone, brought to ER 08/04/12 found on the floor of her home with AMS changes brought to ER for evaluation. Admitted and currently being treated for UTI and PNA. SLP evaluation completed, noted to have difficulty with ingestion of solid foods, currently on Dysphagia 1 Diet. Per staff patient taking some oral liquids, but minimal mount of total meals.   Palliative care meeting completed to discuss Goals of Care with the following family present: patients son, Amarii Amy, grandson, Glendell Docker and grand-daughter, Barnet Glasgow. Patient was semi-lethargic, opened eyes to verbal stimuli, able to answer simple questions, responses slow.   Current Goals of Care as follows:  1) Code Status: DNR/DNI, do not want aggressive measures in event of cardiac/pulmonary arrest  2) Want to continue to treat the treatable medical issues identified at this time, continue lab draws,CBG/ telemetry monitoring, diagnostic radiology testing,  IV fluids, antibiotics, respiratory oxygen and medication support.   3) Would want to have patient brought to the ER for evaluation and acute issues that could be medically managed at this time  4) Future treatment with antibiotics for identified infections as well as intravenous fluid for defined period of time as indicated  5) DO NOT want to pursue artificial tube feedings for artificial nutrition support.  6) Agreeable for plan to discharge to SNF/rehabilitation with Palliative care services to follow.    We discussed patient decline in baseline status, which was fairly independent prior to hospitalization, and the fact that patients ability to sustain adequate oral food/fluids will have to be closely followed. Family informed that Goals of Care may have to be readressed if patient is unable to sustain nutrtional status orally, they verbalized their understanding.    Full Palliative care note to follow.  Freddie Breech, CNS-C Palliative Medicine Team [facility] Team Phone: [phone] Pager: [phone]

## 2012-08-08 NOTE — Progress Notes (Signed)
Clinical Social Worker (CSW) reviewed palliative cares note and observed that family is agreeable to SNF with palliative care to follow. CSW visited pt room to obtain consent for a SNF search however no family present. CSW left a message for pt son Saphyra Hutt [phone] and left a SNF list in pt room.  CSW has updated pt son and grandson contact information in pt chart.  Theresia Bough, MSW, Theresia Majors [phone]

## 2012-08-08 NOTE — Progress Notes (Signed)
Pt transferred from 5500 in bed, with RN and NT, on monitor. Pt on 50% venti mask, sats are 100% and other VVS. Ventimask weaned down to 35%, sats stable at 97. Pt arousable and follows some commands. Portable chest xray done and results called to NP Schorr. Family updated by 5500 RN Ria Clock. Will continue to to monitor.

## 2012-08-08 NOTE — Clinical Documentation Improvement (Signed)
POA DOCUMENTATION CLARIFICATION QUERY  THIS DOCUMENT IS NOT A PERMANENT PART OF THE MEDICAL RECORD  TO RESPOND TO THE THIS QUERY, FOLLOW THE INSTRUCTIONS BELOW:  1. If needed, update documentation for the patient's encounter via the notes activity.  2. Access this query again and click edit on the In [employer].  3. After updating, or not, click F2 to complete all highlighted (required) fields concerning your review. Select "additional documentation in the medical record" OR "no additional documentation provided".  4. Click Sign note button.  5. The deficiency will fall out of your In Basket *Please let us know if you are not able to complete this workflow by phone or e-mail (listed below).  Please update your documentation within the medical record to reflect your response to this query.                                                                                    08/08/12  Dear Dr. Kathie Rhodes. [facility]   In a better effort to capture your patient's severity of illness, reflect appropriate length of stay and utilization of resources, a review of the patient medical record has revealed the following indicators.    Based on your clinical judgment, please clarify and document in a progress note and/or discharge summary the clinical condition associated with the following supporting information:  In responding to this query please exercise your independent judgment.  The fact that a query is asked, does not imply that any particular answer is desired or expected.  Medicare rules require specification as to whether an inpatient diagnosis was present at the time of admission.  Documenting "?sepsis, probable sepsis"  please clarify if patient was septic on admit.  Presented with hypothermia an low bp. Thank you    Please clarify if the following diagnosis,             Sepsis  was:     _ Present at the time of admission  _ NOT present at the time of inpatient  admission and it developed during the inpatient stay  x_ Unable to clinically determine whether the condition was present on admission.  _  Documentation insufficient to determine if condition was present at the time of inpatient admission  You may use possible, probable, or suspect with inpatient documentation. possible, probable, suspected diagnoses MUST be documented at the time of discharge  Reviewed:  no additional documentation provided      Thank You,  Lavonda Jumbo  Clinical Documentation Specialist, BSN: Pager [phone]  Health Information Management [place]

## 2012-08-08 NOTE — Progress Notes (Signed)
PATIENT DETAILS Name: Audrey Reed Age: 79 y.o. Sex: female Date of Birth: March 18, 1918 Admit Date: 08/04/2012 Admitting Physician Joseph Art, DO ZOX:[id number],[id number] M, MD  Subjective: Lethargic, confused.  Upper airway sounds-"gurgling" audible  Assessment/Plan: Principal Problem: Acute Resp Failure -2/2 aspiration PNA -continue with O2 as tolerated, prn suction -continue with Antibioitcs  Active Problems: Aspiration PNA -c/w Vanco/Zosyn -suction prn -on dysphagia 1 diet  UTI -pan sensitive E Coli on urine culture -on Zosyn now, previously on Rocephin  Hypernatremia -2/2 dehydration-from poor oral intake -c/w D5W -awaiting am lytes  AMS -2/2 toxic metabolic encephalopathy-from PNA/UTI/Metabolic derangements -probable underlying dementia  ? Seizures -MRI Brain neg -appreciate Neuro input  DM -CBG's stable -c/w SSI  HTN -controlled with Lisinopril and Coreg-will place parameters  Thrombocytopenia -probably from sepesis from UTI -monitor for now  Failure to Thrive Synd -poor overall prognosis -d/w Son-Jerry Biller on the phone(cell- [phone])-poor prognosis explained-code status discussed-he agrees to DNR. Have placed Palliative Care Consult for goals of care -will further discuss with family-when they get here.  Disposition: Remain inpatient  DVT Prophylaxis: Prophylactic Lovenox  Code Status:  DNR  Procedures:  None  CONSULTS:  None  PHYSICAL EXAM: Vital signs in last 24 hours: Filed Vitals:   08/08/12 0500 08/08/12 0600 08/08/12 0655 08/08/12 0743  BP: 153/69 161/67    Pulse: 69 69 77   Temp:    99.1 F (37.3 C)  TempSrc:    Axillary  Resp: 19 19 20    Height: 5\' 3"  (1.6 m)     Weight: 73.5 kg (162 lb 0.6 oz)     SpO2: 100% 100% 97%     Weight change: 1.5 kg (3 lb 4.9 oz) Body mass index is 28.70 kg/(m^2).   Gen Exam: Awake but lethargic and confused. Gurgling sounds from pooling of secretions in the back of the throat Neck:  Supple, No JVD.   Chest: Bibasilar rales with transmitted upper airway sounds CVS: S1 S2 Regular, no murmurs.  Abdomen: soft, BS +, non tender, non distended.  Extremities: no edema, lower extremities warm to touch. Neurologic: Non Focal.   Skin: No Rash.   Wounds: N/A.    Intake/Output from previous day:  Intake/Output Summary (Last 24 hours) at 08/08/12 0749 Last data filed at 08/08/12 0744  Gross per 24 hour  Intake 1646.25 ml  Output    575 ml  Net 1071.25 ml     LAB RESULTS: CBC  Lab 08/07/12 0533 08/06/12 0900 08/05/12 1127 08/04/12 1118  WBC 11.0* 12.6* 10.5 9.8  HGB 10.6* 13.0 14.6 12.7  HCT 32.0* 38.6 43.6 37.1  PLT 97* 105* 114* 64*  MCV 86.0 84.8 84.8 84.3  MCH 28.5 28.6 28.4 28.9  MCHC 33.1 33.7 33.5 34.2  RDW 18.1* 17.9* 17.5* 16.9*  LYMPHSABS -- -- -- --  MONOABS -- -- -- --  EOSABS -- -- -- --  BASOSABS -- -- -- --  BANDABS -- -- -- --    Chemistries   Lab 08/07/12 0533 08/06/12 0900 08/05/12 1127 08/04/12 1118  NA 150* 146* 144 143  K 3.5 3.7 4.1 3.3*  CL 110 106 105 105  CO2 24 23 26 23   GLUCOSE 131* 72 86 123*  BUN 33* 33* 36* 42*  CREATININE 1.26* 1.12* 1.18* 1.20*  CALCIUM 9.4 9.5 9.6 8.5  MG -- -- -- --    CBG:  Lab 08/08/12 0025 08/07/12 2000 08/07/12 1712 08/07/12 1154 08/07/12 0741  GLUCAP 129* 158* 107* 157* 119*  GFR Estimated Creatinine Clearance: 27.3 ml/min (by C-G formula based on Cr of 1.26).  Coagulation profile  Lab 08/04/12 1118  INR 1.31  PROTIME --    Cardiac Enzymes  Lab 08/05/12 1127 08/04/12 2135 08/04/12 1119  CKMB -- -- --  TROPONINI <0.30 <0.30 <0.30  MYOGLOBIN -- -- --    No components found with this basename: POCBNP:3 No results found for this basename: DDIMER:2 in the last 72 hours No results found for this basename: HGBA1C:2 in the last 72 hours No results found for this basename: CHOL:2,HDL:2,LDLCALC:2,TRIG:2,CHOLHDL:2,LDLDIRECT:2 in the last 72 hours No results found for this basename:  TSH,T4TOTAL,FREET3,T3FREE,THYROIDAB in the last 72 hours  Basename 08/06/12 1130  VITAMINB12 1132*  FOLATE --  FERRITIN --  TIBC --  IRON --  RETICCTPCT --   No results found for this basename: LIPASE:2,AMYLASE:2 in the last 72 hours  Urine Studies No results found for this basename: UACOL:2,UAPR:2,USPG:2,UPH:2,UTP:2,UGL:2,UKET:2,UBIL:2,UHGB:2,UNIT:2,UROB:2,ULEU:2,UEPI:2,UWBC:2,URBC:2,UBAC:2,CAST:2,CRYS:2,UCOM:2,BILUA:2 in the last 72 hours  MICROBIOLOGY: Recent Results (from the past 240 hour(s))  URINE CULTURE     Status: Normal   Collection Time   08/04/12 11:15 AM      Component Value Range Status Comment   Specimen Description URINE, CATHETERIZED   Final    Special Requests NONE   Final    Culture  Setup Time 08/04/2012 18:59   Final    Colony Count >=100,000 COLONIES/ML   Final    Culture ESCHERICHIA COLI   Final    Report Status 08/06/2012 FINAL   Final    Organism ID, Bacteria ESCHERICHIA COLI   Final   CULTURE, BLOOD (ROUTINE X 2)     Status: Normal (Preliminary result)   Collection Time   08/04/12 11:30 AM      Component Value Range Status Comment   Specimen Description BLOOD LEFT HAND   Final    Special Requests BOTTLES DRAWN AEROBIC AND ANAEROBIC 10CC   Final    Culture  Setup Time 08/04/2012 18:44   Final    Culture     Final    Value:        BLOOD CULTURE RECEIVED NO GROWTH TO DATE CULTURE WILL BE HELD FOR 5 DAYS BEFORE ISSUING A FINAL NEGATIVE REPORT   Report Status PENDING   Incomplete   CULTURE, BLOOD (ROUTINE X 2)     Status: Normal (Preliminary result)   Collection Time   08/04/12  1:15 PM      Component Value Range Status Comment   Specimen Description BLOOD LEFT HAND   Final    Special Requests BOTTLES DRAWN AEROBIC AND ANAEROBIC 5CCC   Final    Culture  Setup Time 08/04/2012 18:44   Final    Culture     Final    Value:        BLOOD CULTURE RECEIVED NO GROWTH TO DATE CULTURE WILL BE HELD FOR 5 DAYS BEFORE ISSUING A FINAL NEGATIVE REPORT   Report  Status PENDING   Incomplete   MRSA PCR SCREENING     Status: Normal   Collection Time   08/08/12 12:53 AM      Component Value Range Status Comment   MRSA by PCR NEGATIVE  NEGATIVE Final     RADIOLOGY STUDIES/RESULTS: Ct Head Wo Contrast  08/04/2012  *RADIOLOGY REPORT*  Clinical Data:  The patient was found on the floor  CT HEAD WITHOUT CONTRAST CT CERVICAL SPINE WITHOUT CONTRAST  Technique:  Multidetector CT imaging of the head and cervical spine was performed following  the standard protocol without intravenous contrast.  Multiplanar CT image reconstructions of the cervical spine were also generated.  Comparison:  08/04/2012  CT HEAD  Findings: There is diffuse patchy low density throughout the subcortical and periventricular white matter consistent with chronic small vessel ischemic change.  There is prominence of the sulci and ventricles consistent with brain atrophy.  There is no evidence for acute brain infarct, hemorrhage or mass.  Retention cyst versus polyp noted in the right maxillary sinus. The remaining paranasal sinuses are clear.  The mastoid air cells are clear the skull appears intact.  IMPRESSION:  1.  Small vessel ischemic change and brain atrophy. 2.  No acute intracranial abnormalities.  CT CERVICAL SPINE  Findings: Normal alignment of the cervical spine.  The vertebral body heights and disc spaces are well preserved.  Bones are osteopenic.  There is no fracture or subluxation identified. Scarring noted within the anterior right apex.  Carotid artery calcifications are noted bilaterally.  IMPRESSION:  1.  No acute findings. 2.  Carotid artery calcifications.   Original Report Authenticated By: Signa Kell, M.D.    Ct Cervical Spine Wo Contrast  08/04/2012  *RADIOLOGY REPORT*  Clinical Data:  The patient was found on the floor  CT HEAD WITHOUT CONTRAST CT CERVICAL SPINE WITHOUT CONTRAST  Technique:  Multidetector CT imaging of the head and cervical spine was performed following the  standard protocol without intravenous contrast.  Multiplanar CT image reconstructions of the cervical spine were also generated.  Comparison:  08/04/2012  CT HEAD  Findings: There is diffuse patchy low density throughout the subcortical and periventricular white matter consistent with chronic small vessel ischemic change.  There is prominence of the sulci and ventricles consistent with brain atrophy.  There is no evidence for acute brain infarct, hemorrhage or mass.  Retention cyst versus polyp noted in the right maxillary sinus. The remaining paranasal sinuses are clear.  The mastoid air cells are clear the skull appears intact.  IMPRESSION:  1.  Small vessel ischemic change and brain atrophy. 2.  No acute intracranial abnormalities.  CT CERVICAL SPINE  Findings: Normal alignment of the cervical spine.  The vertebral body heights and disc spaces are well preserved.  Bones are osteopenic.  There is no fracture or subluxation identified. Scarring noted within the anterior right apex.  Carotid artery calcifications are noted bilaterally.  IMPRESSION:  1.  No acute findings. 2.  Carotid artery calcifications.   Original Report Authenticated By: Signa Kell, M.D.    Mr Brain Wo Contrast  08/05/2012  *RADIOLOGY REPORT*  Clinical Data: Unsteady gait.  Garbled speech.  Right-sided facial droop.  Diabetic.  MRI HEAD WITHOUT CONTRAST  Technique:  Multiplanar, multiecho pulse sequences of the brain and surrounding structures were obtained according to standard protocol without intravenous contrast.  Comparison: 08/04/2012 CT.  No comparison MR.  Findings: No acute infarct.  No intracranial hemorrhage.  Moderate to marked small vessel disease type changes.  Global atrophy without hydrocephalus.  No intracranial mass lesion detected on this unenhanced exam.  Major intracranial vascular structures are patent.  IMPRESSION: No acute infarct.  Moderate to marked small vessel disease type changes.   Original Report  Authenticated By: Lacy Duverney, M.D.    Dg Chest Port 1 View  08/08/2012  *RADIOLOGY REPORT*  Clinical Data: Worsening O2 saturation.  PORTABLE CHEST - 1 VIEW  Comparison: Chest radiograph performed 08/06/2012  Findings: Since the recent prior study, there has been interval development of right upper and  lower lung zone airspace opacification, with persistent left basilar airspace opacity, compatible with worsening multifocal pneumonia.  A small left pleural effusion is again seen.  No pneumothorax is identified.  The cardiomediastinal silhouette is borderline normal in size. Calcification is noted in the aortic arch.  No acute osseous abnormalities are identified.  IMPRESSION: Significantly worsening bilateral pneumonia noted; small left pleural effusion seen.  These results were called by telephone on 08/08/2012 at 12:03 a.m. to [facility] on [id number], who verbally acknowledged these results.   Original Report Authenticated By: Tonia Ghent, M.D.    Dg Chest Port 1 View  08/06/2012  *RADIOLOGY REPORT*  Clinical Data: Shortness of breath, cough.  PORTABLE CHEST - 1 VIEW  Comparison: 08/04/2012  Findings: Bibasilar airspace opacities, likely atelectasis.  Small layering effusions bilaterally.  Heart is borderline in size. Diffuse peribronchial thickening.  Stable chronic interstitial prominence. No acute bony abnormality.  IMPRESSION: Stable chronic bronchitic changes and interstitial disease.  Bibasilar atelectasis with small bilateral effusions.   Original Report Authenticated By: Charlett Nose, M.D.    Dg Chest Port 1 View  08/04/2012  *RADIOLOGY REPORT*  Clinical Data: Short of breath, chest pain  PORTABLE CHEST - 1 VIEW  Comparison: Chest radiograph 06/09/2004  Findings: Normal mediastinum and heart silhouette.  There is chronic bronchitic markings centrally.  There is a chronic interstitial pattern which is increased compared to prior.  No focal consolidation.  No pneumothorax.  IMPRESSION: Chronic  interstitial pattern is increased compared to prior exam from 2005.   Original Report Authenticated By: Genevive Bi, M.D.     MEDICATIONS: Scheduled Meds:   . carvedilol  12.5 mg Oral BID WC  . enoxaparin (LOVENOX) injection  30 mg Subcutaneous Q24H  . feeding supplement  1 Container Oral TID BM  . insulin aspart  0-15 Units Subcutaneous Q4H  . lisinopril  40 mg Oral Daily  . piperacillin-tazobactam (ZOSYN)  IV  3.375 g Intravenous Q8H  . sodium chloride  3 mL Intravenous Q12H  . vancomycin  750 mg Intravenous Q24H   Continuous Infusions:   . dextrose 75 mL/hr at 08/07/12 2214   PRN Meds:.acetaminophen, acetaminophen, albuterol, ondansetron (ZOFRAN) IV, ondansetron  Antibiotics: Anti-infectives     Start     Dose/Rate Route Frequency Ordered Stop   08/08/12 0300  piperacillin-tazobactam (ZOSYN) IVPB 3.375 g       3.375 g 12.5 mL/hr over 240 Minutes Intravenous 3 times per day 08/08/12 0210     08/08/12 0300   vancomycin (VANCOCIN) 750 mg in sodium chloride 0.9 % 150 mL IVPB        750 mg 150 mL/hr over 60 Minutes Intravenous Every 24 hours 08/08/12 0210     08/05/12 0000   cefTRIAXone (ROCEPHIN) 1 g in dextrose 5 % 50 mL IVPB  Status:  Discontinued        1 g 100 mL/hr over 30 Minutes Intravenous Every 24 hours 08/04/12 1612 08/04/12 2026   08/04/12 1215   cefTRIAXone (ROCEPHIN) 1 g in dextrose 5 % 50 mL IVPB  Status:  Discontinued        1 g 100 mL/hr over 30 Minutes Intravenous Every 24 hours 08/04/12 1207 08/08/12 0146           Jeoffrey Massed, MD  [facility] Pager:[phone]  If 7PM-7AM, please contact night-coverage [email or web address] Password [facility] 08/08/2012, 7:49 AM   LOS: 4 days

## 2012-08-08 NOTE — Progress Notes (Signed)
Physical Therapy Treatment Patient Details Name: Audrey Reed MRN: [id number] DOB: Mar 13, 1918 Today's Date: 08/08/2012 Time: 7846-9629 PT Time Calculation (min): 23 min  PT Assessment / Plan / Recommendation Comments on Treatment Session  Pt degressed since evaluation due to increase difficulty breathing and now needs supplemental O2 3L.  Pt needs +2 (A) with all mobility.  Pt not transferred to recliner due to increase SOB and was return to bed in chair position.     Follow Up Recommendations  SNF     Equipment Recommendations  None recommended by PT    Frequency Min 2X/week   Plan Discharge plan remains appropriate;Frequency needs to be updated    Precautions / Restrictions Precautions Precautions: Fall Restrictions Weight Bearing Restrictions: No   Pertinent Vitals/Pain No c/o pain    Mobility  Bed Mobility Bed Mobility: Supine to Sit;Sitting - Scoot to [place] of Bed;Sit to Supine Supine to Sit: 1: +2 Total assist;With rails;HOB elevated Supine to Sit: Patient Percentage: 30% Sitting - Scoot to Edge of Bed: 4: Min assist;4: Min guard Sit to Supine: 1: +2 Total assist;With rail;HOB flat Sit to Supine: Patient Percentage: 10% Details for Bed Mobility Assistance: +2 (A) to elevate trunk OOB and cues for proper technique.  Pt continues to lean posterior during initial bed mobility Transfers Transfers: Not assessed Ambulation/Gait Ambulation/Gait Assistance: Not tested (comment)     PT Diagnosis:    PT Problem List:   PT Treatment Interventions:     PT Goals Acute Rehab PT Goals PT Goal Formulation: Patient unable to participate in goal setting Time For Goal Achievement: 08/12/12 Potential to Achieve Goals: Fair Pt will go Supine/Side to Sit: with min assist PT Goal: Supine/Side to Sit - Progress: Not met Pt will go Sit to Supine/Side: with min assist PT Goal: Sit to Supine/Side - Progress: Not met  Visit Information  Last PT Received On: 08/08/12 Assistance  Needed: +2    Subjective Data  Subjective: Difficult to understand pt  Patient Stated Goal: Family estabilished goals of care and decided on continues SNF/rehab   Cognition  Overall Cognitive Status: Impaired Area of Impairment: Safety/judgement;Following commands;Memory;Awareness of deficits;Problem solving Arousal/Alertness:  (In/out arousal; increase arounsal sitting EOB) Orientation Level:  (Unable to fully assess) Behavior During Session: [facility] for tasks performed Current Attention Level: Focused Attention - Other Comments: decreased focused attention Following Commands: Follows one step commands inconsistently    Balance  Balance Balance Assessed: Yes Static Sitting Balance Static Sitting - Balance Support: Feet supported Static Sitting - Level of Assistance: 3: Mod assist;5: Stand by assistance Static Sitting - Comment/# of Minutes: Initial mod (A) to maintain balance due to posterior lean and able to progress to minguard.  Pt able to sit EOB ~10 minutes.  Encouraged productive cough sitting EOB however unable to produce mucus.  End of Session PT - End of Session Equipment Utilized During Treatment: Gait belt Activity Tolerance: Patient limited by fatigue Patient left: in bed;with call bell/phone within reach Nurse Communication: Mobility status   GP     Brittane Grudzinski 08/08/2012, 3:37 PM Jake Shark, PT DPT [phone]

## 2012-08-09 ENCOUNTER — Inpatient Hospital Stay (HOSPITAL_COMMUNITY): Payer: Medicare Other

## 2012-08-09 DIAGNOSIS — R5381 Other malaise: Secondary | ICD-10-CM

## 2012-08-09 DIAGNOSIS — R5383 Other fatigue: Secondary | ICD-10-CM

## 2012-08-09 LAB — GLUCOSE, CAPILLARY
Glucose-Capillary: 137 mg/dL — ABNORMAL HIGH (ref 70–99)
Glucose-Capillary: 148 mg/dL — ABNORMAL HIGH (ref 70–99)
Glucose-Capillary: 58 mg/dL — ABNORMAL LOW (ref 70–99)
Glucose-Capillary: 91 mg/dL (ref 70–99)

## 2012-08-09 LAB — BASIC METABOLIC PANEL
BUN: 24 mg/dL — ABNORMAL HIGH (ref 6–23)
CO2: 27 mEq/L (ref 19–32)
Chloride: 104 mEq/L (ref 96–112)
Glucose, Bld: 143 mg/dL — ABNORMAL HIGH (ref 70–99)
Potassium: 3.2 mEq/L — ABNORMAL LOW (ref 3.5–5.1)
Sodium: 142 mEq/L (ref 135–145)

## 2012-08-09 LAB — CBC
HCT: 37 % (ref 36.0–46.0)
Hemoglobin: 12.3 g/dL (ref 12.0–15.0)
MCHC: 33.2 g/dL (ref 30.0–36.0)
RBC: 4.26 MIL/uL (ref 3.87–5.11)

## 2012-08-09 MED ORDER — ALBUTEROL SULFATE (5 MG/ML) 0.5% IN NEBU
2.5000 mg | INHALATION_SOLUTION | Freq: Four times a day (QID) | RESPIRATORY_TRACT | Status: DC
  Administered 2012-08-09 – 2012-08-11 (×9): 2.5 mg via RESPIRATORY_TRACT
  Filled 2012-08-09 (×8): qty 0.5

## 2012-08-09 MED ORDER — STARCH (THICKENING) PO POWD
ORAL | Status: DC | PRN
  Filled 2012-08-09: qty 227

## 2012-08-09 MED ORDER — POTASSIUM CHLORIDE CRYS ER 20 MEQ PO TBCR: 40.0000 meq | EXTENDED_RELEASE_TABLET | Freq: Once | ORAL | Status: DC

## 2012-08-09 MED ORDER — POTASSIUM CHLORIDE 2 MEQ/ML IV SOLN
INTRAVENOUS | Status: DC
  Administered 2012-08-09 – 2012-08-11 (×3): via INTRAVENOUS
  Filled 2012-08-09 (×5): qty 1000

## 2012-08-09 MED ORDER — DEXTROSE 50 % IV SOLN
INTRAVENOUS | Status: AC
  Administered 2012-08-09: 25 mL
  Filled 2012-08-09: qty 50

## 2012-08-09 NOTE — Progress Notes (Signed)
Inpatient Diabetes Program Recommendations  AACE/ADA: New Consensus Statement on Inpatient Glycemic Control (2013)  Target Ranges:  Prepandial:   less than 140 mg/dL      Peak postprandial:   less than 180 mg/dL (1-2 hours)      Critically ill patients:  140 - 180 mg/dL   Results for FARRIN, SHADLE (MRN [id number]) as of 08/09/2012 11:10  Ref. Range 08/08/2012 07:53 08/08/2012 11:38 08/08/2012 16:19 08/08/2012 20:10 08/09/2012 00:02 08/09/2012 00:36 08/09/2012 03:59 08/09/2012 07:50  Glucose-Capillary Latest Range: 70-99 mg/dL 191 (H) 478 (H) 295 (H) 268 (H) 58 (L) 91 94 145 (H)    Inpatient Diabetes Program Recommendations Correction (SSI): Please consider decreasing Novolog correction scale to sensitive.  Note: Patient has a history of diabetes type 2 and takes Metformin 500mg  every morning at home for diabetes management.  Currently patient is on Novolog moderate correction Q4H.  Patient's blood glucose on 08/08/12 at 20:10 was 268 mg/dl and patient received Novolog 8 units for correction.  At midnight patient's blood glucose was found to be 58 mg/dl and required treatment for hypoglycemia.  Please consider decreasing Novolog correction to sensitive scale.  Will continue to follow.  Thanks, Orlando Penner, RN, BSN, CCRN Diabetes Coordinator Inpatient Diabetes Program [phone]

## 2012-08-09 NOTE — Progress Notes (Signed)
Patient Audrey Reed      DOB: 09-Dec-1917      EAV:[id number]   Palliative Medicine Team at [facility] Progress Note    Subjective: patient more alert today, following simple commands, responses to care oriented questions appropriate.  Filed Vitals:   08/09/12 1600  BP: 123/61  Pulse: 73  Temp: 98.1 F (36.7 C)  Resp: 17   Physical exam: General:Alert, eyes open, verbally responsive HEENT: conjunctivae clear, buccal mucosa moist CHEST: coarse scattered rhonchi, clear with cough ABD: non-tender, soft, BS audible EXT: trace pedal edema bilaterally NEURO: more alert today  Assessment and plan: Hopefully appetite may improve with increased level of alertness  1) Code Status: DNR/DNI 2) Dysphagia/Decreased Oral Intake:evaluated by SLP today 3) Disposition: plan is for SNF/rehabilitation with Palliative Care Services to follow  Time In Time Out Total Time Spent with Patient Total Overall Time  5:00p 5:15p 15 min 15 min     Freddie Breech, CNS-C Palliative Medicine Team [facility] Team Phone: [phone] Pager: [phone]

## 2012-08-09 NOTE — Progress Notes (Signed)
Clinical Child psychotherapist (CSW) contacted by Revonda Standard at [facility] that facility is able to offer pt placement when medically ready. CSW left a message for pt son to inform. CSW contacted and spoke with pt grandson Vincenza Hews who was very appreciative and pleased with bed offer. Vincenza Hews to follow up with his father with information. CSW will remain following and facilitate with dc to [facility]  when pt is medically ready.   Theresia Bough, MSW, Theresia Majors [phone]

## 2012-08-09 NOTE — Progress Notes (Signed)
PATIENT DETAILS Name: Audrey Reed Age: 79 y.o. Sex: female Date of Birth: 1917-11-28 Admit Date: 08/04/2012 Admitting Physician Joseph Art, DO JXB:JYNWG,NFAOZ M, MD  Subjective: Looks a lot better, still very confused.  Assessment/Plan: Principal Problem: Acute Resp Failure -2/2 aspiration PNA -continue with O2 as tolerated, prn suction -continue with Antibioitcs  Active Problems: Aspiration PNA -c/w Vanco/Zosyn -suction prn -on dysphagia 1 diet - Awaiting repeat swallow evaluation  UTI -pan sensitive E Coli on urine culture -on Zosyn now, previously on Rocephin  Hypernatremia -2/2 dehydration-from poor oral intake -c/w D5W - This is resolved with hydration  Hypokalemia -replete K today, recheck lytes in am  AMS -2/2 toxic metabolic encephalopathy-from PNA/UTI/Metabolic derangements -probable underlying dementia  ? Seizures -MRI Brain neg -appreciate Neuro input  DM -CBG's stable -c/w SSI  HTN -controlled with Lisinopril and Coreg-will place parameters  Thrombocytopenia -probably from sepesis from UTI -monitor for now  Failure to Thrive Synd -poor overall prognosis -d/w Son-Jerry Vasey on the phone(cell- [phone])-poor prognosis explained-code status discussed-he agrees to DNR - Appreciate palliative care input  Disposition: Remain inpatient-SNF on discharge  DVT Prophylaxis: Prophylactic Lovenox  Code Status:  DNR  Procedures:  None  CONSULTS:  None  PHYSICAL EXAM: Vital signs in last 24 hours: Filed Vitals:   08/08/12 1955 08/09/12 0023 08/09/12 0400 08/09/12 0800  BP: 127/60 125/75 128/70 140/65  Pulse: 81 75 78 83  Temp: 97.9 F (36.6 C) 98.2 F (36.8 C) 98.3 F (36.8 C)   TempSrc: Oral Oral Oral   Resp: 21 19 19 19   Height:      Weight:   71.1 kg (156 lb 12 oz)   SpO2: 98% 100% 98% 100%    Weight change: -2.4 kg (-5 lb 4.7 oz) Body mass index is 27.77 kg/(m^2).   Gen Exam: Awake but lethargic and confused.    Neck: Supple, No JVD.   Chest: Bibasilar rales with transmitted upper airway sounds CVS: S1 S2 Regular, no murmurs.  Abdomen: soft, BS +, non tender, non distended.  Extremities: no edema, lower extremities warm to touch. Neurologic: Non Focal.   Skin: No Rash.   Wounds: N/A.    Intake/Output from previous day:  Intake/Output Summary (Last 24 hours) at 08/09/12 1035 Last data filed at 08/09/12 0700  Gross per 24 hour  Intake 1376.25 ml  Output    775 ml  Net 601.25 ml     LAB RESULTS: CBC  Lab 08/09/12 0510 08/08/12 0846 08/07/12 0533 08/06/12 0900 08/05/12 1127  WBC 14.4* 17.7* 11.0* 12.6* 10.5  HGB 12.3 13.0 10.6* 13.0 14.6  HCT 37.0 39.1 32.0* 38.6 43.6  PLT 81* 82* 97* 105* 114*  MCV 86.9 87.3 86.0 84.8 84.8  MCH 28.9 29.0 28.5 28.6 28.4  MCHC 33.2 33.2 33.1 33.7 33.5  RDW 18.1* 18.2* 18.1* 17.9* 17.5*  LYMPHSABS -- -- -- -- --  MONOABS -- -- -- -- --  EOSABS -- -- -- -- --  BASOSABS -- -- -- -- --  BANDABS -- -- -- -- --    Chemistries   Lab 08/09/12 0510 08/08/12 0846 08/07/12 0533 08/06/12 0900 08/05/12 1127  NA 142 146* 150* 146* 144  K 3.2* 3.5 3.5 3.7 4.1  CL 104 106 110 106 105  CO2 27 25 24 23 26   GLUCOSE 143* 156* 131* 72 86  BUN 24* 30* 33* 33* 36*  CREATININE 1.19* 1.17* 1.26* 1.12* 1.18*  CALCIUM 9.0 9.4 9.4 9.5 9.6  MG -- -- -- -- --  CBG:  Lab 08/09/12 0750 08/09/12 0359 08/09/12 0036 08/09/12 0002 08/08/12 2010  GLUCAP 145* 94 91 58* 268*    GFR Estimated Creatinine Clearance: 28.5 ml/min (by C-G formula based on Cr of 1.19).  Coagulation profile  Lab 08/04/12 1118  INR 1.31  PROTIME --    Cardiac Enzymes  Lab 08/05/12 1127 08/04/12 2135 08/04/12 1119  CKMB -- -- --  TROPONINI <0.30 <0.30 <0.30  MYOGLOBIN -- -- --    No components found with this basename: POCBNP:3 No results found for this basename: DDIMER:2 in the last 72 hours No results found for this basename: HGBA1C:2 in the last 72 hours No results found for  this basename: CHOL:2,HDL:2,LDLCALC:2,TRIG:2,CHOLHDL:2,LDLDIRECT:2 in the last 72 hours No results found for this basename: TSH,T4TOTAL,FREET3,T3FREE,THYROIDAB in the last 72 hours  Basename 08/06/12 1130  VITAMINB12 1132*  FOLATE --  FERRITIN --  TIBC --  IRON --  RETICCTPCT --   No results found for this basename: LIPASE:2,AMYLASE:2 in the last 72 hours  Urine Studies No results found for this basename: UACOL:2,UAPR:2,USPG:2,UPH:2,UTP:2,UGL:2,UKET:2,UBIL:2,UHGB:2,UNIT:2,UROB:2,ULEU:2,UEPI:2,UWBC:2,URBC:2,UBAC:2,CAST:2,CRYS:2,UCOM:2,BILUA:2 in the last 72 hours  MICROBIOLOGY: Recent Results (from the past 240 hour(s))  URINE CULTURE     Status: Normal   Collection Time   08/04/12 11:15 AM      Component Value Range Status Comment   Specimen Description URINE, CATHETERIZED   Final    Special Requests NONE   Final    Culture  Setup Time 08/04/2012 18:59   Final    Colony Count >=100,000 COLONIES/ML   Final    Culture ESCHERICHIA COLI   Final    Report Status 08/06/2012 FINAL   Final    Organism ID, Bacteria ESCHERICHIA COLI   Final   CULTURE, BLOOD (ROUTINE X 2)     Status: Normal (Preliminary result)   Collection Time   08/04/12 11:30 AM      Component Value Range Status Comment   Specimen Description BLOOD LEFT HAND   Final    Special Requests BOTTLES DRAWN AEROBIC AND ANAEROBIC 10CC   Final    Culture  Setup Time 08/04/2012 18:44   Final    Culture     Final    Value:        BLOOD CULTURE RECEIVED NO GROWTH TO DATE CULTURE WILL BE HELD FOR 5 DAYS BEFORE ISSUING A FINAL NEGATIVE REPORT   Report Status PENDING   Incomplete   CULTURE, BLOOD (ROUTINE X 2)     Status: Normal (Preliminary result)   Collection Time   08/04/12  1:15 PM      Component Value Range Status Comment   Specimen Description BLOOD LEFT HAND   Final    Special Requests BOTTLES DRAWN AEROBIC AND ANAEROBIC 5CCC   Final    Culture  Setup Time 08/04/2012 18:44   Final    Culture     Final    Value:         BLOOD CULTURE RECEIVED NO GROWTH TO DATE CULTURE WILL BE HELD FOR 5 DAYS BEFORE ISSUING A FINAL NEGATIVE REPORT   Report Status PENDING   Incomplete   MRSA PCR SCREENING     Status: Normal   Collection Time   08/08/12 12:53 AM      Component Value Range Status Comment   MRSA by PCR NEGATIVE  NEGATIVE Final     RADIOLOGY STUDIES/RESULTS: Ct Head Wo Contrast  08/04/2012  *RADIOLOGY REPORT*  Clinical Data:  The patient was found on the floor  CT  HEAD WITHOUT CONTRAST CT CERVICAL SPINE WITHOUT CONTRAST  Technique:  Multidetector CT imaging of the head and cervical spine was performed following the standard protocol without intravenous contrast.  Multiplanar CT image reconstructions of the cervical spine were also generated.  Comparison:  08/04/2012  CT HEAD  Findings: There is diffuse patchy low density throughout the subcortical and periventricular white matter consistent with chronic small vessel ischemic change.  There is prominence of the sulci and ventricles consistent with brain atrophy.  There is no evidence for acute brain infarct, hemorrhage or mass.  Retention cyst versus polyp noted in the right maxillary sinus. The remaining paranasal sinuses are clear.  The mastoid air cells are clear the skull appears intact.  IMPRESSION:  1.  Small vessel ischemic change and brain atrophy. 2.  No acute intracranial abnormalities.  CT CERVICAL SPINE  Findings: Normal alignment of the cervical spine.  The vertebral body heights and disc spaces are well preserved.  Bones are osteopenic.  There is no fracture or subluxation identified. Scarring noted within the anterior right apex.  Carotid artery calcifications are noted bilaterally.  IMPRESSION:  1.  No acute findings. 2.  Carotid artery calcifications.   Original Report Authenticated By: Signa Kell, M.D.    Ct Cervical Spine Wo Contrast  08/04/2012  *RADIOLOGY REPORT*  Clinical Data:  The patient was found on the floor  CT HEAD WITHOUT CONTRAST CT CERVICAL  SPINE WITHOUT CONTRAST  Technique:  Multidetector CT imaging of the head and cervical spine was performed following the standard protocol without intravenous contrast.  Multiplanar CT image reconstructions of the cervical spine were also generated.  Comparison:  08/04/2012  CT HEAD  Findings: There is diffuse patchy low density throughout the subcortical and periventricular white matter consistent with chronic small vessel ischemic change.  There is prominence of the sulci and ventricles consistent with brain atrophy.  There is no evidence for acute brain infarct, hemorrhage or mass.  Retention cyst versus polyp noted in the right maxillary sinus. The remaining paranasal sinuses are clear.  The mastoid air cells are clear the skull appears intact.  IMPRESSION:  1.  Small vessel ischemic change and brain atrophy. 2.  No acute intracranial abnormalities.  CT CERVICAL SPINE  Findings: Normal alignment of the cervical spine.  The vertebral body heights and disc spaces are well preserved.  Bones are osteopenic.  There is no fracture or subluxation identified. Scarring noted within the anterior right apex.  Carotid artery calcifications are noted bilaterally.  IMPRESSION:  1.  No acute findings. 2.  Carotid artery calcifications.   Original Report Authenticated By: Signa Kell, M.D.    Mr Brain Wo Contrast  08/05/2012  *RADIOLOGY REPORT*  Clinical Data: Unsteady gait.  Garbled speech.  Right-sided facial droop.  Diabetic.  MRI HEAD WITHOUT CONTRAST  Technique:  Multiplanar, multiecho pulse sequences of the brain and surrounding structures were obtained according to standard protocol without intravenous contrast.  Comparison: 08/04/2012 CT.  No comparison MR.  Findings: No acute infarct.  No intracranial hemorrhage.  Moderate to marked small vessel disease type changes.  Global atrophy without hydrocephalus.  No intracranial mass lesion detected on this unenhanced exam.  Major intracranial vascular structures are  patent.  IMPRESSION: No acute infarct.  Moderate to marked small vessel disease type changes.   Original Report Authenticated By: Lacy Duverney, M.D.    Dg Chest Port 1 View  08/08/2012  *RADIOLOGY REPORT*  Clinical Data: Worsening O2 saturation.  PORTABLE CHEST - 1  VIEW  Comparison: Chest radiograph performed 08/06/2012  Findings: Since the recent prior study, there has been interval development of right upper and lower lung zone airspace opacification, with persistent left basilar airspace opacity, compatible with worsening multifocal pneumonia.  A small left pleural effusion is again seen.  No pneumothorax is identified.  The cardiomediastinal silhouette is borderline normal in size. Calcification is noted in the aortic arch.  No acute osseous abnormalities are identified.  IMPRESSION: Significantly worsening bilateral pneumonia noted; small left pleural effusion seen.  These results were called by telephone on 08/08/2012 at 12:03 a.m. to [facility] on [id number], who verbally acknowledged these results.   Original Report Authenticated By: Tonia Ghent, M.D.    Dg Chest Port 1 View  08/06/2012  *RADIOLOGY REPORT*  Clinical Data: Shortness of breath, cough.  PORTABLE CHEST - 1 VIEW  Comparison: 08/04/2012  Findings: Bibasilar airspace opacities, likely atelectasis.  Small layering effusions bilaterally.  Heart is borderline in size. Diffuse peribronchial thickening.  Stable chronic interstitial prominence. No acute bony abnormality.  IMPRESSION: Stable chronic bronchitic changes and interstitial disease.  Bibasilar atelectasis with small bilateral effusions.   Original Report Authenticated By: Charlett Nose, M.D.    Dg Chest Port 1 View  08/04/2012  *RADIOLOGY REPORT*  Clinical Data: Short of breath, chest pain  PORTABLE CHEST - 1 VIEW  Comparison: Chest radiograph 06/09/2004  Findings: Normal mediastinum and heart silhouette.  There is chronic bronchitic markings centrally.  There is a chronic interstitial  pattern which is increased compared to prior.  No focal consolidation.  No pneumothorax.  IMPRESSION: Chronic interstitial pattern is increased compared to prior exam from 2005.   Original Report Authenticated By: Genevive Bi, M.D.     MEDICATIONS: Scheduled Meds:    . albuterol  2.5 mg Nebulization Q6H  . carvedilol  12.5 mg Oral BID WC  . enoxaparin (LOVENOX) injection  30 mg Subcutaneous Q24H  . feeding supplement  1 Container Oral TID BM  . insulin aspart  0-15 Units Subcutaneous Q4H  . lisinopril  40 mg Oral Daily  . piperacillin-tazobactam (ZOSYN)  IV  3.375 g Intravenous Q8H  . sodium chloride  3 mL Intravenous Q12H  . vancomycin  750 mg Intravenous Q24H   Continuous Infusions:    . dextrose 40 mL/hr at 08/09/12 0805   PRN Meds:.acetaminophen, acetaminophen, albuterol, atropine, bisacodyl, ondansetron (ZOFRAN) IV, ondansetron  Antibiotics: Anti-infectives     Start     Dose/Rate Route Frequency Ordered Stop   08/08/12 0300   piperacillin-tazobactam (ZOSYN) IVPB 3.375 g        3.375 g 12.5 mL/hr over 240 Minutes Intravenous 3 times per day 08/08/12 0210     08/08/12 0300   vancomycin (VANCOCIN) 750 mg in sodium chloride 0.9 % 150 mL IVPB        750 mg 150 mL/hr over 60 Minutes Intravenous Every 24 hours 08/08/12 0210     08/05/12 0000   cefTRIAXone (ROCEPHIN) 1 g in dextrose 5 % 50 mL IVPB  Status:  Discontinued        1 g 100 mL/hr over 30 Minutes Intravenous Every 24 hours 08/04/12 1612 08/04/12 2026   08/04/12 1215   cefTRIAXone (ROCEPHIN) 1 g in dextrose 5 % 50 mL IVPB  Status:  Discontinued        1 g 100 mL/hr over 30 Minutes Intravenous Every 24 hours 08/04/12 1207 08/08/12 0146           Jeoffrey Massed, MD  [facility] Pager:[phone]  If 7PM-7AM, please contact night-coverage [email or web address] Password [email or web address] 08/09/2012, 10:35 AM   LOS: 5 days

## 2012-08-09 NOTE — Procedures (Signed)
Objective Swallowing Evaluation: Modified Barium Swallowing Study  Patient Details  Name: Audrey Reed MRN: [id number] Date of Birth: 10/19/1917  Today's Date: 08/09/2012 Time: 1320-1350 SLP Time Calculation (min): 30 min  Past Medical History:  Past Medical History  Diagnosis Date  . HTN (hypertension)   . CAD (coronary artery disease)   . Venous insufficiency   . Hypercholesterolemia   . DM (diabetes mellitus)   . Diverticulosis of colon   . Colon polyp   . Adenocarcinoma of breast   . DJD (degenerative joint disease)   . Osteoporosis   . Anxiety disorder    Past Surgical History:  Past Surgical History  Procedure Date  . Cataract extraction   . Mastectomy     for breast cancer   HPI:  Audrey Reed is a 79 y.o. female who lives home alone and does her own ADL. Family noticed that her gait was becoming more unsteady since before christmas. sHe told family that she took one of her xanax and they attributed it to this. At christmas dinner, she was confused and took some medications at the dinner table and became even more confused. Her grandson stayed with her that night and noticed a very strong odor to her urine. This AM about 9, her son went to check on her and found her on the floor inside of her house. She was brought to the ER and was found to have a CBG of 106 and temperature of 89. She was placed on a bear hugger. Pt initally started on soft diet with thin liquids but has had apparent aspriation events, MBS to objectively assess swallow function.      Assessment / Plan / Recommendation Clinical Impression  Dysphagia Diagnosis: Moderate pharyngeal phase dysphagia;Suspected primary esophageal dysphagia Clinical impression: Pt presents with a moderate pharyngeal dysphagia and the appearance of a severe esophageal dysphagia. Pt with discoordinated oral propulsion of boluses to the pharyngeal cavity with a delay in swallow initiation. Base of tongue retraction, laryngeal  elevation , epiglottic deflection and closure of the laryngeal vestibule are weak. Thin and nectar thick liquids immediately penetrate to the vocal cords and spill to the airway once pt abducts vocal folds. Aspiration is intermittently silent. When pt does cough, severe esophageal residuals backflow to the pyriform sinuses with probable aspiration events.   With therapeutic intervention, the pt consumed nectar thick liquids via teaspoon with a chin tuck, facilitating airway protection before the swallow and decreasing vallecular residuals. The pt was not able to coordinate chin tuck with cup sips despite max cues. Recommend Dys 1 (puree) diet with nectar thick liquids via teaspoon with a chin tuck and double swallows. Questionable if pt will be able to complete strategies with full supervision. Family has verbalized no alternate nutrition. May want to consider comfort feeds depending on pts tolerance of this diet.   The pt will continue to be at high aspiration risk with all PO due to appearance of severe esophageal dysphagia. Suspect that recent aspriation events likely due to aspiration of esophageal stasis rather than pharyngeal dysphagia.  Would pt benefit from esoohageal assessment/GI referral given her history of stricture?     Treatment Recommendation  Therapy as outlined in treatment plan below    Diet Recommendation Dysphagia 1 (Puree);Nectar-thick liquid   Liquid Administration via: Spoon Medication Administration: Crushed with puree Supervision: Staff feed patient;Full supervision/cueing for compensatory strategies Compensations: Slow rate;Small sips/bites;Multiple dry swallows after each bite/sip;Follow solids with liquid Postural Changes and/or Swallow Maneuvers: Chin  tuck;Out of bed for meals;Upright 30-60 min after meal    Other  Recommendations Recommended Consults: Consider esophageal assessment;Consider GI evaluation Oral Care Recommendations: Oral care QID Other Recommendations:  Order thickener from pharmacy   Follow Up Recommendations  Skilled Nursing facility    Frequency and Duration min 2x/week  2 weeks   Pertinent Vitals/Pain NA    SLP Swallow Goals Patient will utilize recommended strategies during swallow to increase swallowing safety with: Moderate cueing   General HPI: Audrey Reed is a 79 y.o. female who lives home alone and does her own ADL. Family noticed that her gait was becoming more unsteady since before christmas. sHe told family that she took one of her xanax and they attributed it to this. At christmas dinner, she was confused and took some medications at the dinner table and became even more confused. Her grandson stayed with her that night and noticed a very strong odor to her urine. This AM about 9, her son went to check on her and found her on the floor inside of her house. She was brought to the ER and was found to have a CBG of 106 and temperature of 89. She was placed on a bear hugger. Pt initally started on soft diet with thin liquids but has had apparent aspriation events, MBS to objectively assess swallow function.  Type of Study: Modified Barium Swallowing Study Reason for Referral: Objectively evaluate swallowing function Diet Prior to this Study: NPO Temperature Spikes Noted: No Respiratory Status: Supplemental O2 delivered via (comment) History of Recent Intubation: No Behavior/Cognition: Alert;Confused Oral Cavity - Dentition: Edentulous Oral Motor / Sensory Function: Within functional limits Self-Feeding Abilities: Able to feed self Patient Positioning: Upright in chair Baseline Vocal Quality: Clear Volitional Cough: Congested Volitional Swallow: Able to elicit Anatomy: Within functional limits Pharyngeal Secretions: Not observed secondary MBS    Reason for Referral Objectively evaluate swallowing function   Oral Phase Oral Preparation/Oral Phase Oral Phase: Impaired Oral - Honey Oral - Honey Teaspoon: Reduced posterior  propulsion;Lingual/palatal residue Oral - Nectar Oral - Nectar Teaspoon: Reduced posterior propulsion;Lingual/palatal residue Oral - Nectar Cup: Reduced posterior propulsion;Lingual/palatal residue Oral - Thin Oral - Thin Cup: Reduced posterior propulsion;Lingual/palatal residue Oral - Solids Oral - Puree: Reduced posterior propulsion;Lingual/palatal residue;Delayed oral transit Oral - Mechanical Soft: Reduced posterior propulsion;Lingual/palatal residue;Delayed oral transit   Pharyngeal Phase Pharyngeal Phase Pharyngeal Phase: Impaired Pharyngeal - Honey Pharyngeal - Honey Teaspoon: Premature spillage to valleculae;Delayed swallow initiation;Reduced epiglottic inversion;Reduced laryngeal elevation;Reduced airway/laryngeal closure;Reduced tongue base retraction;Pharyngeal residue - valleculae (Chin tuck improves airway closure, reduces residue. ) Pharyngeal - Nectar Pharyngeal - Nectar Teaspoon: Premature spillage to valleculae;Delayed swallow initiation;Reduced epiglottic inversion;Reduced laryngeal elevation;Reduced airway/laryngeal closure;Reduced tongue base retraction;Pharyngeal residue - valleculae (Chin tuck improves airway closure, reduces residue. ) Pharyngeal - Nectar Cup: Premature spillage to valleculae;Delayed swallow initiation;Reduced epiglottic inversion;Reduced laryngeal elevation;Reduced airway/laryngeal closure;Reduced tongue base retraction;Pharyngeal residue - valleculae;Penetration/Aspiration during swallow;Penetration/Aspiration after swallow;Moderate aspiration;Premature spillage to pyriform sinuses (pt could coordinate sip and chin tuck) Penetration/Aspiration details (nectar cup): Material enters airway, passes BELOW cords without attempt by patient to eject out (silent aspiration);Material enters airway, CONTACTS cords then ejected out;Material enters airway, CONTACTS cords and not ejected out Pharyngeal - Thin Pharyngeal - Thin Cup: Premature spillage to  valleculae;Delayed swallow initiation;Reduced epiglottic inversion;Reduced laryngeal elevation;Reduced airway/laryngeal closure;Reduced tongue base retraction;Pharyngeal residue - valleculae Pharyngeal - Solids Pharyngeal - Puree: Premature spillage to valleculae;Delayed swallow initiation;Reduced epiglottic inversion;Reduced laryngeal elevation;Reduced airway/laryngeal closure;Reduced tongue base retraction;Pharyngeal residue - valleculae (Chin tuck  improves airway closure, reduces residue. ) Pharyngeal - Mechanical Soft: Premature spillage to valleculae;Delayed swallow initiation;Reduced epiglottic inversion;Reduced laryngeal elevation;Reduced airway/laryngeal closure;Reduced tongue base retraction;Pharyngeal residue - valleculae (Chin tuck improves airway closure, reduces residue. )  Cervical Esophageal Phase    GO    Cervical Esophageal Phase Cervical Esophageal Phase: Impaired Cervical Esophageal Phase - Comment Cervical Esophageal Comment: Pt observed to have appearance of severe stasis throughout esophagus after only minimal trials of thin liquids. With cough, stasis surged to pharynx and penetrated the airway.         Harlon Ditty, MA CCC-SLP [phone]  Claudine Mouton 08/09/2012, 2:21 PM

## 2012-08-09 NOTE — Progress Notes (Signed)
Speech Language Pathology Dysphagia Treatment Patient Details Name: Audrey Reed MRN: [id number] DOB: May 05, 1918 Today's Date: 08/09/2012 Time: 1040-1110 SLP Time Calculation (min): 30 min  Assessment / Plan / Recommendation Clinical Impression  Treatment session focused on tolerance of PO in setting of development of probable aspiration pna. Pt alert but now with overt signs of oral discoordination of bolus and decreased airway protection with ineffective cough response. Pts family at bedside, agreeable to objective testing to determine safety with PO. Pts family are not unwilling to try thickened liquids if needed to decreased aspiration risk. Family also with new reports of history of esopahgeal stricture with dilatation several years ago which may also impact function. Suggest pt be NPO until MBS today at 1pm. RN aware, will change diet given MD order for SLP to modify diet as needed.     Diet Recommendation  Initiate / Change Diet: NPO    SLP Plan MBS   Pertinent Vitals/Pain NA   Swallowing Goals     General Temperature Spikes Noted: No Respiratory Status: Supplemental O2 delivered via (comment) Behavior/Cognition: Alert;Confused Oral Cavity - Dentition: Edentulous Patient Positioning: Upright in bed  Oral Cavity - Oral Hygiene Does patient have any of the following "at risk" factors?: Lips - dry, cracked;Tongue - coated;Nutritional status - inadequate;Saliva - thick, dry mouth;Other - dysphagia Patient is AT RISK - Oral Care Protocol followed (see row info): Yes   Dysphagia Treatment Treatment focused on: Skilled observation of diet tolerance;Patient/family/caregiver education Patient observed directly with PO's: Yes Type of PO's observed: Dysphagia 1 (puree);Thin liquids;Nectar-thick liquids Feeding: Needs assist Liquids provided via: Cup Pharyngeal Phase Signs & Symptoms: Suspected delayed swallow initiation;Immediate cough Type of cueing: Verbal Amount of cueing: Minimal    GO    Harlon Ditty, MA CCC-SLP [phone]  Claudine Mouton 08/09/2012, 11:17 AM

## 2012-08-10 DIAGNOSIS — R627 Adult failure to thrive: Secondary | ICD-10-CM

## 2012-08-10 LAB — CULTURE, BLOOD (ROUTINE X 2)
Culture: NO GROWTH
Culture: NO GROWTH

## 2012-08-10 LAB — BASIC METABOLIC PANEL
BUN: 24 mg/dL — ABNORMAL HIGH (ref 6–23)
CO2: 26 mEq/L (ref 19–32)
Calcium: 8.5 mg/dL (ref 8.4–10.5)
Creatinine, Ser: 1.14 mg/dL — ABNORMAL HIGH (ref 0.50–1.10)
Glucose, Bld: 155 mg/dL — ABNORMAL HIGH (ref 70–99)

## 2012-08-10 LAB — GLUCOSE, CAPILLARY
Glucose-Capillary: 124 mg/dL — ABNORMAL HIGH (ref 70–99)
Glucose-Capillary: 85 mg/dL (ref 70–99)
Glucose-Capillary: 284 mg/dL — ABNORMAL HIGH (ref 70–99)
Glucose-Capillary: 213 mg/dL — ABNORMAL HIGH (ref 70–99)

## 2012-08-10 LAB — CBC
Hemoglobin: 11.1 g/dL — ABNORMAL LOW (ref 12.0–15.0)
MCH: 28.5 pg (ref 26.0–34.0)
MCV: 86.9 fL (ref 78.0–100.0)
RBC: 3.89 MIL/uL (ref 3.87–5.11)

## 2012-08-10 MED ORDER — POTASSIUM CHLORIDE CRYS ER 20 MEQ PO TBCR
40.0000 meq | EXTENDED_RELEASE_TABLET | Freq: Once | ORAL | Status: AC
  Administered 2012-08-10: 40 meq via ORAL
  Filled 2012-08-10: qty 2

## 2012-08-10 MED ORDER — SACCHAROMYCES BOULARDII 250 MG PO CAPS
250.0000 mg | ORAL_CAPSULE | Freq: Two times a day (BID) | ORAL | Status: DC
  Administered 2012-08-10 – 2012-08-12 (×5): 250 mg via ORAL
  Filled 2012-08-10 (×7): qty 1

## 2012-08-10 NOTE — Progress Notes (Signed)
PATIENT DETAILS Name: Audrey Reed Age: 79 y.o. Sex: female Date of Birth: [birth year] Admit Date: 08/04/2012 Admitting Physician Joseph Art, DO ZOX:[id number] M, MD  Subjective: Per RN tolerating Dys 1 diet. Still somewhat confused.Loose stools this am as well.  Assessment/Plan: Principal Problem: Acute Resp Failure -2/2 aspiration PNA -continue with O2 as tolerated, prn suction -continue with Antibioitcs  Active Problems: Aspiration PNA -c/w Vanco/Zosyn -suction prn. Seen by SLP yesterday -on dysphagia 1 diet-family accepting aspiration risks  UTI -pan sensitive E Coli on urine culture -on Zosyn now, previously on Rocephin  Hypernatremia -2/2 dehydration-from poor oral intake -c/w D5W - This has  resolved with hydration -check lytes periodically  Hypokalemia -awaiting am labs  AMS -2/2 toxic metabolic encephalopathy-from PNA/UTI/Metabolic derangements -probable underlying dementia  ? Seizures -MRI Brain neg -appreciate Neuro input  DM -CBG's stable -c/w SSI  HTN -controlled with Lisinopril and Coreg-with parameters  Thrombocytopenia -probably from sepesis from UTI/PNA -monitor for now  Failure to Thrive Synd -poor overall prognosis -d/w Son-Jerry Behlke on the phone(cell- [phone])-poor prognosis explained-code status discussed-he agrees to DNR - Appreciate palliative care input  Disposition: Remain inpatient but transfer back to non tele bed-needs SNF on discharge  DVT Prophylaxis: Prophylactic Lovenox  Code Status:  DNR  Procedures:  None  CONSULTS:  None  PHYSICAL EXAM: Vital signs in last 24 hours: Filed Vitals:   08/09/12 2000 08/10/12 0000 08/10/12 0144 08/10/12 0500  BP: 115/54 103/49  145/60  Pulse: 64 64 65 62  Temp: 97.8 F (36.6 C) 97.4 F (36.3 C)  97.4 F (36.3 C)  TempSrc: Oral Oral  Oral  Resp: 19 20 20 17   Height:      Weight:      SpO2: 100% 95% 95% 97%    Weight change:  Body mass index is 27.77  kg/(m^2).   Gen Exam: Awake, some confusion  Neck: Supple, No JVD.   Chest:B/L clear CVS: S1 S2 Regular, no murmurs.  Abdomen: soft, BS +, non tender, non distended.  Extremities: trace edema, lower extremities warm to touch. Neurologic: Non Focal.   Skin: No Rash.   Wounds: N/A.    Intake/Output from previous day:  Intake/Output Summary (Last 24 hours) at 08/10/12 0727 Last data filed at 08/10/12 0500  Gross per 24 hour  Intake 1088.67 ml  Output    775 ml  Net 313.67 ml     LAB RESULTS: CBC  Lab 08/09/12 0510 08/08/12 0846 08/07/12 0533 08/06/12 0900 08/05/12 1127  WBC 14.4* 17.7* 11.0* 12.6* 10.5  HGB 12.3 13.0 10.6* 13.0 14.6  HCT 37.0 39.1 32.0* 38.6 43.6  PLT 81* 82* 97* 105* 114*  MCV 86.9 87.3 86.0 84.8 84.8  MCH 28.9 29.0 28.5 28.6 28.4  MCHC 33.2 33.2 33.1 33.7 33.5  RDW 18.1* 18.2* 18.1* 17.9* 17.5*  LYMPHSABS -- -- -- -- --  MONOABS -- -- -- -- --  EOSABS -- -- -- -- --  BASOSABS -- -- -- -- --  BANDABS -- -- -- -- --    Chemistries   Lab 08/09/12 0510 08/08/12 0846 08/07/12 0533 08/06/12 0900 08/05/12 1127  NA 142 146* 150* 146* 144  K 3.2* 3.5 3.5 3.7 4.1  CL 104 106 110 106 105  CO2 27 25 24 23 26   GLUCOSE 143* 156* 131* 72 86  BUN 24* 30* 33* 33* 36*  CREATININE 1.19* 1.17* 1.26* 1.12* 1.18*  CALCIUM 9.0 9.4 9.4 9.5 9.6  MG -- -- -- -- --  CBG:  Lab 08/09/12 2002 08/09/12 1548 08/09/12 1126 08/09/12 0750 08/09/12 0359  GLUCAP 210* 148* 137* 145* 94    GFR Estimated Creatinine Clearance: 28.5 ml/min (by C-G formula based on Cr of 1.19).  Coagulation profile  Lab 08/04/12 1118  INR 1.31  PROTIME --    Cardiac Enzymes  Lab 08/05/12 1127 08/04/12 2135 08/04/12 1119  CKMB -- -- --  TROPONINI <0.30 <0.30 <0.30  MYOGLOBIN -- -- --    No components found with this basename: POCBNP:3 No results found for this basename: DDIMER:2 in the last 72 hours No results found for this basename: HGBA1C:2 in the last 72 hours No results  found for this basename: CHOL:2,HDL:2,LDLCALC:2,TRIG:2,CHOLHDL:2,LDLDIRECT:2 in the last 72 hours No results found for this basename: TSH,T4TOTAL,FREET3,T3FREE,THYROIDAB in the last 72 hours No results found for this basename: VITAMINB12:2,FOLATE:2,FERRITIN:2,TIBC:2,IRON:2,RETICCTPCT:2 in the last 72 hours No results found for this basename: LIPASE:2,AMYLASE:2 in the last 72 hours  Urine Studies No results found for this basename: UACOL:2,UAPR:2,USPG:2,UPH:2,UTP:2,UGL:2,UKET:2,UBIL:2,UHGB:2,UNIT:2,UROB:2,ULEU:2,UEPI:2,UWBC:2,URBC:2,UBAC:2,CAST:2,CRYS:2,UCOM:2,BILUA:2 in the last 72 hours  MICROBIOLOGY: Recent Results (from the past 240 hour(s))  URINE CULTURE     Status: Normal   Collection Time   08/04/12 11:15 AM      Component Value Range Status Comment   Specimen Description URINE, CATHETERIZED   Final    Special Requests NONE   Final    Culture  Setup Time 08/04/2012 18:59   Final    Colony Count >=100,000 COLONIES/ML   Final    Culture ESCHERICHIA COLI   Final    Report Status 08/06/2012 FINAL   Final    Organism ID, Bacteria ESCHERICHIA COLI   Final   CULTURE, BLOOD (ROUTINE X 2)     Status: Normal (Preliminary result)   Collection Time   08/04/12 11:30 AM      Component Value Range Status Comment   Specimen Description BLOOD LEFT HAND   Final    Special Requests BOTTLES DRAWN AEROBIC AND ANAEROBIC 10CC   Final    Culture  Setup Time 08/04/2012 18:44   Final    Culture     Final    Value:        BLOOD CULTURE RECEIVED NO GROWTH TO DATE CULTURE WILL BE HELD FOR 5 DAYS BEFORE ISSUING A FINAL NEGATIVE REPORT   Report Status PENDING   Incomplete   CULTURE, BLOOD (ROUTINE X 2)     Status: Normal (Preliminary result)   Collection Time   08/04/12  1:15 PM      Component Value Range Status Comment   Specimen Description BLOOD LEFT HAND   Final    Special Requests BOTTLES DRAWN AEROBIC AND ANAEROBIC 5CCC   Final    Culture  Setup Time 08/04/2012 18:44   Final    Culture     Final     Value:        BLOOD CULTURE RECEIVED NO GROWTH TO DATE CULTURE WILL BE HELD FOR 5 DAYS BEFORE ISSUING A FINAL NEGATIVE REPORT   Report Status PENDING   Incomplete   MRSA PCR SCREENING     Status: Normal   Collection Time   08/08/12 12:53 AM      Component Value Range Status Comment   MRSA by PCR NEGATIVE  NEGATIVE Final     RADIOLOGY STUDIES/RESULTS: Ct Head Wo Contrast  08/04/2012  *RADIOLOGY REPORT*  Clinical Data:  The patient was found on the floor  CT HEAD WITHOUT CONTRAST CT CERVICAL SPINE WITHOUT CONTRAST  Technique:  Multidetector  CT imaging of the head and cervical spine was performed following the standard protocol without intravenous contrast.  Multiplanar CT image reconstructions of the cervical spine were also generated.  Comparison:  08/04/2012  CT HEAD  Findings: There is diffuse patchy low density throughout the subcortical and periventricular white matter consistent with chronic small vessel ischemic change.  There is prominence of the sulci and ventricles consistent with brain atrophy.  There is no evidence for acute brain infarct, hemorrhage or mass.  Retention cyst versus polyp noted in the right maxillary sinus. The remaining paranasal sinuses are clear.  The mastoid air cells are clear the skull appears intact.  IMPRESSION:  1.  Small vessel ischemic change and brain atrophy. 2.  No acute intracranial abnormalities.  CT CERVICAL SPINE  Findings: Normal alignment of the cervical spine.  The vertebral body heights and disc spaces are well preserved.  Bones are osteopenic.  There is no fracture or subluxation identified. Scarring noted within the anterior right apex.  Carotid artery calcifications are noted bilaterally.  IMPRESSION:  1.  No acute findings. 2.  Carotid artery calcifications.   Original Report Authenticated By: Signa Kell, M.D.    Ct Cervical Spine Wo Contrast  08/04/2012  *RADIOLOGY REPORT*  Clinical Data:  The patient was found on the floor  CT HEAD WITHOUT  CONTRAST CT CERVICAL SPINE WITHOUT CONTRAST  Technique:  Multidetector CT imaging of the head and cervical spine was performed following the standard protocol without intravenous contrast.  Multiplanar CT image reconstructions of the cervical spine were also generated.  Comparison:  08/04/2012  CT HEAD  Findings: There is diffuse patchy low density throughout the subcortical and periventricular white matter consistent with chronic small vessel ischemic change.  There is prominence of the sulci and ventricles consistent with brain atrophy.  There is no evidence for acute brain infarct, hemorrhage or mass.  Retention cyst versus polyp noted in the right maxillary sinus. The remaining paranasal sinuses are clear.  The mastoid air cells are clear the skull appears intact.  IMPRESSION:  1.  Small vessel ischemic change and brain atrophy. 2.  No acute intracranial abnormalities.  CT CERVICAL SPINE  Findings: Normal alignment of the cervical spine.  The vertebral body heights and disc spaces are well preserved.  Bones are osteopenic.  There is no fracture or subluxation identified. Scarring noted within the anterior right apex.  Carotid artery calcifications are noted bilaterally.  IMPRESSION:  1.  No acute findings. 2.  Carotid artery calcifications.   Original Report Authenticated By: Signa Kell, M.D.    Mr Brain Wo Contrast  08/05/2012  *RADIOLOGY REPORT*  Clinical Data: Unsteady gait.  Garbled speech.  Right-sided facial droop.  Diabetic.  MRI HEAD WITHOUT CONTRAST  Technique:  Multiplanar, multiecho pulse sequences of the brain and surrounding structures were obtained according to standard protocol without intravenous contrast.  Comparison: 08/04/2012 CT.  No comparison MR.  Findings: No acute infarct.  No intracranial hemorrhage.  Moderate to marked small vessel disease type changes.  Global atrophy without hydrocephalus.  No intracranial mass lesion detected on this unenhanced exam.  Major intracranial  vascular structures are patent.  IMPRESSION: No acute infarct.  Moderate to marked small vessel disease type changes.   Original Report Authenticated By: Lacy Duverney, M.D.    Dg Chest Port 1 View  08/08/2012  *RADIOLOGY REPORT*  Clinical Data: Worsening O2 saturation.  PORTABLE CHEST - 1 VIEW  Comparison: Chest radiograph performed 08/06/2012  Findings: Since the recent  prior study, there has been interval development of right upper and lower lung zone airspace opacification, with persistent left basilar airspace opacity, compatible with worsening multifocal pneumonia.  A small left pleural effusion is again seen.  No pneumothorax is identified.  The cardiomediastinal silhouette is borderline normal in size. Calcification is noted in the aortic arch.  No acute osseous abnormalities are identified.  IMPRESSION: Significantly worsening bilateral pneumonia noted; small left pleural effusion seen.  These results were called by telephone on 08/08/2012 at 12:03 a.m. to [facility] on [id number], who verbally acknowledged these results.   Original Report Authenticated By: Tonia Ghent, M.D.    Dg Chest Port 1 View  08/06/2012  *RADIOLOGY REPORT*  Clinical Data: Shortness of breath, cough.  PORTABLE CHEST - 1 VIEW  Comparison: 08/04/2012  Findings: Bibasilar airspace opacities, likely atelectasis.  Small layering effusions bilaterally.  Heart is borderline in size. Diffuse peribronchial thickening.  Stable chronic interstitial prominence. No acute bony abnormality.  IMPRESSION: Stable chronic bronchitic changes and interstitial disease.  Bibasilar atelectasis with small bilateral effusions.   Original Report Authenticated By: Charlett Nose, M.D.    Dg Chest Port 1 View  08/04/2012  *RADIOLOGY REPORT*  Clinical Data: Short of breath, chest pain  PORTABLE CHEST - 1 VIEW  Comparison: Chest radiograph 06/09/2004  Findings: Normal mediastinum and heart silhouette.  There is chronic bronchitic markings centrally.  There is a  chronic interstitial pattern which is increased compared to prior.  No focal consolidation.  No pneumothorax.  IMPRESSION: Chronic interstitial pattern is increased compared to prior exam from 2005.   Original Report Authenticated By: Genevive Bi, M.D.     MEDICATIONS: Scheduled Meds:    . albuterol  2.5 mg Nebulization Q6H  . carvedilol  12.5 mg Oral BID WC  . enoxaparin (LOVENOX) injection  30 mg Subcutaneous Q24H  . feeding supplement  1 Container Oral TID BM  . insulin aspart  0-15 Units Subcutaneous Q4H  . lisinopril  40 mg Oral Daily  . piperacillin-tazobactam (ZOSYN)  IV  3.375 g Intravenous Q8H  . sodium chloride  3 mL Intravenous Q12H  . vancomycin  750 mg Intravenous Q24H   Continuous Infusions:    . dextrose 5 % 1,000 mL with potassium chloride 20 mEq infusion 40 mL/hr at 08/09/12 1800   PRN Meds:.acetaminophen, acetaminophen, albuterol, atropine, bisacodyl, food thickener, ondansetron (ZOFRAN) IV, ondansetron  Antibiotics: Anti-infectives     Start     Dose/Rate Route Frequency Ordered Stop   08/08/12 0300   piperacillin-tazobactam (ZOSYN) IVPB 3.375 g        3.375 g 12.5 mL/hr over 240 Minutes Intravenous 3 times per day 08/08/12 0210     08/08/12 0300   vancomycin (VANCOCIN) 750 mg in sodium chloride 0.9 % 150 mL IVPB        750 mg 150 mL/hr over 60 Minutes Intravenous Every 24 hours 08/08/12 0210     08/05/12 0000   cefTRIAXone (ROCEPHIN) 1 g in dextrose 5 % 50 mL IVPB  Status:  Discontinued        1 g 100 mL/hr over 30 Minutes Intravenous Every 24 hours 08/04/12 1612 08/04/12 2026   08/04/12 1215   cefTRIAXone (ROCEPHIN) 1 g in dextrose 5 % 50 mL IVPB  Status:  Discontinued        1 g 100 mL/hr over 30 Minutes Intravenous Every 24 hours 08/04/12 1207 08/08/12 0146           Jeoffrey Massed, MD  [facility] Pager:[phone]  If 7PM-7AM, please contact night-coverage [email or web address] Password [email or web address] 08/10/2012, 7:27 AM   LOS: 6  days

## 2012-08-10 NOTE — Progress Notes (Signed)
Patient's foley was removed at 9 am today and patient has not voided yet.  She has been bladder scanned multiple times throughout the day and finally reached 304 ml at 2130.  I then In & Out cath'd patient and got 275 ml.  Daphane Shepherd, on call for [facility] has been notified of urine output.    Patient has also been having very frequent stools and is very sore and red in her groin area; received order for Flexi Seal.  Will continue to monitor.  Vivi Martens RN

## 2012-08-11 ENCOUNTER — Inpatient Hospital Stay (HOSPITAL_COMMUNITY): Payer: Medicare Other

## 2012-08-11 LAB — GLUCOSE, CAPILLARY
Glucose-Capillary: 104 mg/dL — ABNORMAL HIGH (ref 70–99)
Glucose-Capillary: 164 mg/dL — ABNORMAL HIGH (ref 70–99)
Glucose-Capillary: 162 mg/dL — ABNORMAL HIGH (ref 70–99)

## 2012-08-11 LAB — CBC
MCV: 87.6 fL (ref 78.0–100.0)
Platelets: 126 10*3/uL — ABNORMAL LOW (ref 150–400)
RDW: 18 % — ABNORMAL HIGH (ref 11.5–15.5)
WBC: 11.8 10*3/uL — ABNORMAL HIGH (ref 4.0–10.5)

## 2012-08-11 LAB — BASIC METABOLIC PANEL
Chloride: 108 mEq/L (ref 96–112)
Creatinine, Ser: 1.16 mg/dL — ABNORMAL HIGH (ref 0.50–1.10)
GFR calc Af Amer: 46 mL/min — ABNORMAL LOW (ref >90)

## 2012-08-11 LAB — VANCOMYCIN, TROUGH: Vancomycin Tr: 9.6 ug/mL — ABNORMAL LOW (ref 10.0–20.0)

## 2012-08-11 MED ORDER — AMOXICILLIN-POT CLAVULANATE 500-125 MG PO TABS
1.0000 | ORAL_TABLET | Freq: Two times a day (BID) | ORAL | Status: DC
  Filled 2012-08-11 (×2): qty 1

## 2012-08-11 MED ORDER — POTASSIUM CHLORIDE CRYS ER 20 MEQ PO TBCR
40.0000 meq | EXTENDED_RELEASE_TABLET | Freq: Once | ORAL | Status: AC
  Administered 2012-08-11: 40 meq via ORAL
  Filled 2012-08-11: qty 2

## 2012-08-11 MED ORDER — AMOXICILLIN-POT CLAVULANATE 250-62.5 MG/5ML PO SUSR
500.0000 mg | Freq: Two times a day (BID) | ORAL | Status: DC
  Administered 2012-08-11 – 2012-08-12 (×3): 500 mg via ORAL
  Filled 2012-08-11 (×4): qty 10

## 2012-08-11 MED ORDER — VANCOMYCIN HCL 10 G IV SOLR
1500.0000 mg | INTRAVENOUS | Status: DC
  Administered 2012-08-11: 1500 mg via INTRAVENOUS
  Filled 2012-08-11: qty 1500

## 2012-08-11 NOTE — Progress Notes (Signed)
Pt tx from 3300 prior to this RN's arrival. She has a stage 1 barrier cream in use to buttock and perineal area. Also has a wound to her lt heel possibly a blister that has burst alleyvn in place. Julien Nordmann [facility]

## 2012-08-11 NOTE — Progress Notes (Signed)
PATIENT DETAILS Name: Audrey Reed Age: 80 y.o. Sex: female Date of Birth: 02/25/1918 Admit Date: 08/04/2012 Admitting Physician Joseph Art, DO JXB:JYNWG,NFAOZ M, MD  Subjective: Soft frequent stools-but not loose or watery. Much more alert today  Assessment/Plan: Principal Problem: Acute Resp Failure -2/2 aspiration PNA -continue with O2 as tolerated, prn suction -continue with Antibioitcs  Active Problems: Aspiration PNA - Will stop both Vanco and Zosyn, start Augmentin -suction prn. Seen by SLP yesterday -on dysphagia 1 diet-family accepting aspiration risks  Diarrhea -? Antibiotic related - Stool C. difficile PCR negative - On a probiotic - Will stop both vancomycin and Zosyn and she if the diarrhea gets better  UTI -pan sensitive E Coli on urine culture - On Augmentin  Hypernatremia -2/2 dehydration-from poor oral intake -c/w D5W - This has  resolved with hydration -check lytes periodically  Hypokalemia - Will repeat and recheck electrolytes  AMS -2/2 toxic metabolic encephalopathy-from PNA/UTI/Metabolic derangements -probable underlying dementia  ? Seizures -MRI Brain neg -appreciate Neuro input  DM -CBG's stable -c/w SSI  HTN -controlled with Coreg-with parameters - Hold the lisinopril for now  Thrombocytopenia -probably from sepesis from UTI/PNA -monitor for now- slowly resolving, platelet count now increased to 126  Failure to Thrive Synd -poor overall prognosis -d/w Son-Jerry Brucks on the phone(cell- [id number])-poor prognosis explained-code status discussed-he agrees to DNR - Appreciate palliative care input  Disposition: Remain inpatient but transfer back to non tele bed-needs SNF on discharge  DVT Prophylaxis: Prophylactic Lovenox  Code Status:  DNR  Procedures:  None  CONSULTS:  None  PHYSICAL EXAM: Vital signs in last 24 hours: Filed Vitals:   08/11/12 0740 08/11/12 0817 08/11/12 1113 08/11/12 1353  BP: 156/52   139/55   Pulse: 59  62   Temp: 97.9 F (36.6 C)  97.8 F (36.6 C)   TempSrc: Oral  Oral   Resp: 14  17   Height:      Weight:      SpO2: 100% 100% 97% 93%    Weight change:  Body mass index is 28.31 kg/(m^2).   Gen Exam: Awake, some confusion  Neck: Supple, No JVD.   Chest:B/L clear CVS: S1 S2 Regular, no murmurs.  Abdomen: soft, BS +, non tender, non distended.  Extremities: trace edema, lower extremities warm to touch. Neurologic: Non Focal.   Skin: No Rash.   Wounds: N/A.    Intake/Output from previous day:  Intake/Output Summary (Last 24 hours) at 08/11/12 1413 Last data filed at 08/11/12 1200  Gross per 24 hour  Intake 2270.92 ml  Output    525 ml  Net 1745.92 ml     LAB RESULTS: CBC  Lab 08/11/12 1018 08/10/12 0745 08/09/12 0510 08/08/12 0846 08/07/12 0533  WBC 11.8* 12.0* 14.4* 17.7* 11.0*  HGB 11.0* 11.1* 12.3 13.0 10.6*  HCT 33.9* 33.8* 37.0 39.1 32.0*  PLT 126* 88* 81* 82* 97*  MCV 87.6 86.9 86.9 87.3 86.0  MCH 28.4 28.5 28.9 29.0 28.5  MCHC 32.4 32.8 33.2 33.2 33.1  RDW 18.0* 17.9* 18.1* 18.2* 18.1*  LYMPHSABS -- -- -- -- --  MONOABS -- -- -- -- --  EOSABS -- -- -- -- --  BASOSABS -- -- -- -- --  BANDABS -- -- -- -- --    Chemistries   Lab 08/11/12 1018 08/10/12 0745 08/09/12 0510 08/08/12 0846 08/07/12 0533  NA 144 145 142 146* 150*  K 3.3* 3.4* 3.2* 3.5 3.5  CL 108 107 104 106 110  CO2 26 26 27 25 24   GLUCOSE 201* 155* 143* 156* 131*  BUN 22 24* 24* 30* 33*  CREATININE 1.16* 1.14* 1.19* 1.17* 1.26*  CALCIUM 8.0* 8.5 9.0 9.4 9.4  MG -- -- -- -- --    CBG:  Lab 08/11/12 1128 08/11/12 0742 08/11/12 0417 08/11/12 0003 08/10/12 2045  GLUCAP 164* 104* 78 151* 213*    GFR Estimated Creatinine Clearance: 29.5 ml/min (by C-G formula based on Cr of 1.16).  Coagulation profile No results found for this basename: INR:5,PROTIME:5 in the last 168 hours  Cardiac Enzymes  Lab 08/05/12 1127 08/04/12 2135  CKMB -- --  TROPONINI <0.30  <0.30  MYOGLOBIN -- --    No components found with this basename: POCBNP:3 No results found for this basename: DDIMER:2 in the last 72 hours No results found for this basename: HGBA1C:2 in the last 72 hours No results found for this basename: CHOL:2,HDL:2,LDLCALC:2,TRIG:2,CHOLHDL:2,LDLDIRECT:2 in the last 72 hours No results found for this basename: TSH,T4TOTAL,FREET3,T3FREE,THYROIDAB in the last 72 hours No results found for this basename: VITAMINB12:2,FOLATE:2,FERRITIN:2,TIBC:2,IRON:2,RETICCTPCT:2 in the last 72 hours No results found for this basename: LIPASE:2,AMYLASE:2 in the last 72 hours  Urine Studies No results found for this basename: UACOL:2,UAPR:2,USPG:2,UPH:2,UTP:2,UGL:2,UKET:2,UBIL:2,UHGB:2,UNIT:2,UROB:2,ULEU:2,UEPI:2,UWBC:2,URBC:2,UBAC:2,CAST:2,CRYS:2,UCOM:2,BILUA:2 in the last 72 hours  MICROBIOLOGY: Recent Results (from the past 240 hour(s))  URINE CULTURE     Status: Normal   Collection Time   08/04/12 11:15 AM      Component Value Range Status Comment   Specimen Description URINE, CATHETERIZED   Final    Special Requests NONE   Final    Culture  Setup Time 08/04/2012 18:59   Final    Colony Count >=100,000 COLONIES/ML   Final    Culture ESCHERICHIA COLI   Final    Report Status 08/06/2012 FINAL   Final    Organism ID, Bacteria ESCHERICHIA COLI   Final   CULTURE, BLOOD (ROUTINE X 2)     Status: Normal   Collection Time   08/04/12 11:30 AM      Component Value Range Status Comment   Specimen Description BLOOD LEFT HAND   Final    Special Requests BOTTLES DRAWN AEROBIC AND ANAEROBIC 10CC   Final    Culture  Setup Time 08/04/2012 18:44   Final    Culture NO GROWTH 5 DAYS   Final    Report Status 08/10/2012 FINAL   Final   CULTURE, BLOOD (ROUTINE X 2)     Status: Normal   Collection Time   08/04/12  1:15 PM      Component Value Range Status Comment   Specimen Description BLOOD LEFT HAND   Final    Special Requests BOTTLES DRAWN AEROBIC AND ANAEROBIC [facility]    Final    Culture  Setup Time 08/04/2012 18:44   Final    Culture NO GROWTH 5 DAYS   Final    Report Status 08/10/2012 FINAL   Final   MRSA PCR SCREENING     Status: Normal   Collection Time   08/08/12 12:53 AM      Component Value Range Status Comment   MRSA by PCR NEGATIVE  NEGATIVE Final   CLOSTRIDIUM DIFFICILE BY PCR     Status: Normal   Collection Time   08/10/12  9:28 AM      Component Value Range Status Comment   C difficile by pcr NEGATIVE  NEGATIVE Final     RADIOLOGY STUDIES/RESULTS: Ct Head Wo Contrast  08/04/2012  *RADIOLOGY REPORT*  Clinical  Data:  The patient was found on the floor  CT HEAD WITHOUT CONTRAST CT CERVICAL SPINE WITHOUT CONTRAST  Technique:  Multidetector CT imaging of the head and cervical spine was performed following the standard protocol without intravenous contrast.  Multiplanar CT image reconstructions of the cervical spine were also generated.  Comparison:  08/04/2012  CT HEAD  Findings: There is diffuse patchy low density throughout the subcortical and periventricular white matter consistent with chronic small vessel ischemic change.  There is prominence of the sulci and ventricles consistent with brain atrophy.  There is no evidence for acute brain infarct, hemorrhage or mass.  Retention cyst versus polyp noted in the right maxillary sinus. The remaining paranasal sinuses are clear.  The mastoid air cells are clear the skull appears intact.  IMPRESSION:  1.  Small vessel ischemic change and brain atrophy. 2.  No acute intracranial abnormalities.  CT CERVICAL SPINE  Findings: Normal alignment of the cervical spine.  The vertebral body heights and disc spaces are well preserved.  Bones are osteopenic.  There is no fracture or subluxation identified. Scarring noted within the anterior right apex.  Carotid artery calcifications are noted bilaterally.  IMPRESSION:  1.  No acute findings. 2.  Carotid artery calcifications.   Original Report Authenticated By: Signa Kell,  M.D.    Ct Cervical Spine Wo Contrast  08/04/2012  *RADIOLOGY REPORT*  Clinical Data:  The patient was found on the floor  CT HEAD WITHOUT CONTRAST CT CERVICAL SPINE WITHOUT CONTRAST  Technique:  Multidetector CT imaging of the head and cervical spine was performed following the standard protocol without intravenous contrast.  Multiplanar CT image reconstructions of the cervical spine were also generated.  Comparison:  08/04/2012  CT HEAD  Findings: There is diffuse patchy low density throughout the subcortical and periventricular white matter consistent with chronic small vessel ischemic change.  There is prominence of the sulci and ventricles consistent with brain atrophy.  There is no evidence for acute brain infarct, hemorrhage or mass.  Retention cyst versus polyp noted in the right maxillary sinus. The remaining paranasal sinuses are clear.  The mastoid air cells are clear the skull appears intact.  IMPRESSION:  1.  Small vessel ischemic change and brain atrophy. 2.  No acute intracranial abnormalities.  CT CERVICAL SPINE  Findings: Normal alignment of the cervical spine.  The vertebral body heights and disc spaces are well preserved.  Bones are osteopenic.  There is no fracture or subluxation identified. Scarring noted within the anterior right apex.  Carotid artery calcifications are noted bilaterally.  IMPRESSION:  1.  No acute findings. 2.  Carotid artery calcifications.   Original Report Authenticated By: Signa Kell, M.D.    Mr Brain Wo Contrast  08/05/2012  *RADIOLOGY REPORT*  Clinical Data: Unsteady gait.  Garbled speech.  Right-sided facial droop.  Diabetic.  MRI HEAD WITHOUT CONTRAST  Technique:  Multiplanar, multiecho pulse sequences of the brain and surrounding structures were obtained according to standard protocol without intravenous contrast.  Comparison: 08/04/2012 CT.  No comparison MR.  Findings: No acute infarct.  No intracranial hemorrhage.  Moderate to marked small vessel disease  type changes.  Global atrophy without hydrocephalus.  No intracranial mass lesion detected on this unenhanced exam.  Major intracranial vascular structures are patent.  IMPRESSION: No acute infarct.  Moderate to marked small vessel disease type changes.   Original Report Authenticated By: Lacy Duverney, M.D.    Dg Chest Port 1 View  08/08/2012  *RADIOLOGY REPORT*  Clinical Data: Worsening O2 saturation.  PORTABLE CHEST - 1 VIEW  Comparison: Chest radiograph performed 08/06/2012  Findings: Since the recent prior study, there has been interval development of right upper and lower lung zone airspace opacification, with persistent left basilar airspace opacity, compatible with worsening multifocal pneumonia.  A small left pleural effusion is again seen.  No pneumothorax is identified.  The cardiomediastinal silhouette is borderline normal in size. Calcification is noted in the aortic arch.  No acute osseous abnormalities are identified.  IMPRESSION: Significantly worsening bilateral pneumonia noted; small left pleural effusion seen.  These results were called by telephone on 08/08/2012 at 12:03 a.m. to [facility] on [id number], who verbally acknowledged these results.   Original Report Authenticated By: Tonia Ghent, M.D.    Dg Chest Port 1 View  08/06/2012  *RADIOLOGY REPORT*  Clinical Data: Shortness of breath, cough.  PORTABLE CHEST - 1 VIEW  Comparison: 08/04/2012  Findings: Bibasilar airspace opacities, likely atelectasis.  Small layering effusions bilaterally.  Heart is borderline in size. Diffuse peribronchial thickening.  Stable chronic interstitial prominence. No acute bony abnormality.  IMPRESSION: Stable chronic bronchitic changes and interstitial disease.  Bibasilar atelectasis with small bilateral effusions.   Original Report Authenticated By: Charlett Nose, M.D.    Dg Chest Port 1 View  08/04/2012  *RADIOLOGY REPORT*  Clinical Data: Short of breath, chest pain  PORTABLE CHEST - 1 VIEW  Comparison:  Chest radiograph 06/09/2004  Findings: Normal mediastinum and heart silhouette.  There is chronic bronchitic markings centrally.  There is a chronic interstitial pattern which is increased compared to prior.  No focal consolidation.  No pneumothorax.  IMPRESSION: Chronic interstitial pattern is increased compared to prior exam from 2005.   Original Report Authenticated By: Genevive Bi, M.D.     MEDICATIONS: Scheduled Meds:    . albuterol  2.5 mg Nebulization Q6H  . amoxicillin-clavulanate  500 mg Oral Q12H  . carvedilol  12.5 mg Oral BID WC  . enoxaparin (LOVENOX) injection  30 mg Subcutaneous Q24H  . feeding supplement  1 Container Oral TID BM  . insulin aspart  0-15 Units Subcutaneous Q4H  . potassium chloride  40 mEq Oral Once  . saccharomyces boulardii  250 mg Oral BID  . sodium chloride  3 mL Intravenous Q12H   Continuous Infusions:    . dextrose 5 % 1,000 mL with potassium chloride 20 mEq infusion 75 mL/hr at 08/10/12 2255   PRN Meds:.acetaminophen, acetaminophen, albuterol, atropine, bisacodyl, food thickener, ondansetron (ZOFRAN) IV, ondansetron  Antibiotics: Anti-infectives     Start     Dose/Rate Route Frequency Ordered Stop   08/11/12 1415   amoxicillin-clavulanate (AUGMENTIN) 250-62.5 MG/5ML suspension 500 mg        500 mg Oral Every 12 hours 08/11/12 1403     08/11/12 1330   amoxicillin-clavulanate (AUGMENTIN) 500-125 MG per tablet 500 mg  Status:  Discontinued        1 tablet Oral 2 times daily 08/11/12 1221 08/11/12 1403   08/11/12 0400   vancomycin (VANCOCIN) 1,500 mg in sodium chloride 0.9 % 500 mL IVPB  Status:  Discontinued        1,500 mg 250 mL/hr over 120 Minutes Intravenous Every 24 hours 08/11/12 0323 08/11/12 1221   08/08/12 0300   piperacillin-tazobactam (ZOSYN) IVPB 3.375 g  Status:  Discontinued        3.375 g 12.5 mL/hr over 240 Minutes Intravenous 3 times per day 08/08/12 0210 08/11/12 1221   08/08/12 0300  vancomycin (VANCOCIN) 750 mg in  sodium chloride 0.9 % 150 mL IVPB  Status:  Discontinued        750 mg 150 mL/hr over 60 Minutes Intravenous Every 24 hours 08/08/12 0210 08/11/12 0321   08/05/12 0000   cefTRIAXone (ROCEPHIN) 1 g in dextrose 5 % 50 mL IVPB  Status:  Discontinued        1 g 100 mL/hr over 30 Minutes Intravenous Every 24 hours 08/04/12 1612 08/04/12 2026   08/04/12 1215   cefTRIAXone (ROCEPHIN) 1 g in dextrose 5 % 50 mL IVPB  Status:  Discontinued        1 g 100 mL/hr over 30 Minutes Intravenous Every 24 hours 08/04/12 1207 08/08/12 0146           Jeoffrey Massed, MD  [facility] Pager:[phone]  If 7PM-7AM, please contact night-coverage [email or web address] Password [email or web address] 08/11/2012, 2:13 PM   LOS: 7 days

## 2012-08-11 NOTE — Progress Notes (Signed)
ANTIBIOTIC CONSULT NOTE - FOLLOW UP  Pharmacy Consult for vancomycin Indication: pneumonia  Labs:  Basename 08/10/12 0745 08/09/12 0510 08/08/12 0846  WBC 12.0* 14.4* 17.7*  HGB 11.1* 12.3 13.0  PLT 88* 81* 82*  LABCREA -- -- --  CREATININE 1.14* 1.19* 1.17*   Estimated Creatinine Clearance: 29.8 ml/min (by C-G formula based on Cr of 1.14).  Basename 08/11/12 0216  VANCOTROUGH 9.6*  VANCOPEAK --  Drue Dun --  GENTTROUGH --  GENTPEAK --  GENTRANDOM --  TOBRATROUGH --  TOBRAPEAK --  TOBRARND --  AMIKACINPEAK --  AMIKACINTROU --  AMIKACIN --     Microbiology: Recent Results (from the past 720 hour(s))  URINE CULTURE     Status: Normal   Collection Time   08/04/12 11:15 AM      Component Value Range Status Comment   Specimen Description URINE, CATHETERIZED   Final    Special Requests NONE   Final    Culture  Setup Time 08/04/2012 18:59   Final    Colony Count >=100,000 COLONIES/ML   Final    Culture ESCHERICHIA COLI   Final    Report Status 08/06/2012 FINAL   Final    Organism ID, Bacteria ESCHERICHIA COLI   Final   CULTURE, BLOOD (ROUTINE X 2)     Status: Normal   Collection Time   08/04/12 11:30 AM      Component Value Range Status Comment   Specimen Description BLOOD LEFT HAND   Final    Special Requests BOTTLES DRAWN AEROBIC AND ANAEROBIC 10CC   Final    Culture  Setup Time 08/04/2012 18:44   Final    Culture NO GROWTH 5 DAYS   Final    Report Status 08/10/2012 FINAL   Final   CULTURE, BLOOD (ROUTINE X 2)     Status: Normal   Collection Time   08/04/12  1:15 PM      Component Value Range Status Comment   Specimen Description BLOOD LEFT HAND   Final    Special Requests BOTTLES DRAWN AEROBIC AND ANAEROBIC [facility]   Final    Culture  Setup Time 08/04/2012 18:44   Final    Culture NO GROWTH 5 DAYS   Final    Report Status 08/10/2012 FINAL   Final   MRSA PCR SCREENING     Status: Normal   Collection Time   08/08/12 12:53 AM      Component Value Range Status  Comment   MRSA by PCR NEGATIVE  NEGATIVE Final   CLOSTRIDIUM DIFFICILE BY PCR     Status: Normal   Collection Time   08/10/12  9:28 AM      Component Value Range Status Comment   C difficile by pcr NEGATIVE  NEGATIVE Final     Anti-infectives     Start     Dose/Rate Route Frequency Ordered Stop   08/11/12 0400   vancomycin (VANCOCIN) 1,500 mg in sodium chloride 0.9 % 500 mL IVPB        1,500 mg 250 mL/hr over 120 Minutes Intravenous Every 24 hours 08/11/12 0323     08/08/12 0300  piperacillin-tazobactam (ZOSYN) IVPB 3.375 g       3.375 g 12.5 mL/hr over 240 Minutes Intravenous 3 times per day 08/08/12 0210     08/08/12 0300   vancomycin (VANCOCIN) 750 mg in sodium chloride 0.9 % 150 mL IVPB  Status:  Discontinued        750 mg 150 mL/hr over 60  Minutes Intravenous Every 24 hours 08/08/12 0210 08/11/12 0321   08/05/12 0000   cefTRIAXone (ROCEPHIN) 1 g in dextrose 5 % 50 mL IVPB  Status:  Discontinued        1 g 100 mL/hr over 30 Minutes Intravenous Every 24 hours 08/04/12 1612 08/04/12 2026   08/04/12 1215   cefTRIAXone (ROCEPHIN) 1 g in dextrose 5 % 50 mL IVPB  Status:  Discontinued        1 g 100 mL/hr over 30 Minutes Intravenous Every 24 hours 08/04/12 1207 08/08/12 0146          Assessment: 79yo female subtherapeutic on vancomycin with initial dosing for aspiration PNA.  Goal of Therapy:  Vancomycin trough level 15-20 mcg/ml  Plan:  Will increase vancomycin to 1500mg  IV Q24H for calculated trough of ~18 and continue to monitor.  Colleen Can PharmD BCPS 08/11/2012,3:24 AM

## 2012-08-11 NOTE — Progress Notes (Signed)
Protocol done. Patient has no previous history other than smoking. Has clear Bilateral Breath Sounds diminished in the bases at this time. No need for further therapy scheduled does have a ventolin Prn if needed

## 2012-08-12 LAB — BASIC METABOLIC PANEL
BUN: 17 mg/dL (ref 6–23)
Calcium: 8.1 mg/dL — ABNORMAL LOW (ref 8.4–10.5)
Creatinine, Ser: 0.9 mg/dL (ref 0.50–1.10)
GFR calc Af Amer: 62 mL/min — ABNORMAL LOW (ref >90)
GFR calc non Af Amer: 54 mL/min — ABNORMAL LOW (ref >90)
Potassium: 3.6 mEq/L (ref 3.5–5.1)

## 2012-08-12 LAB — GLUCOSE, CAPILLARY: Glucose-Capillary: 106 mg/dL — ABNORMAL HIGH (ref 70–99)

## 2012-08-12 MED ORDER — ALBUTEROL SULFATE (5 MG/ML) 0.5% IN NEBU
2.5000 mg | INHALATION_SOLUTION | Freq: Four times a day (QID) | RESPIRATORY_TRACT | Status: DC
  Administered 2012-08-12 – 2012-08-15 (×14): 2.5 mg via RESPIRATORY_TRACT
  Filled 2012-08-12 (×12): qty 0.5

## 2012-08-12 MED ORDER — LOPERAMIDE HCL 2 MG PO CAPS
2.0000 mg | ORAL_CAPSULE | ORAL | Status: DC | PRN
  Administered 2012-08-12 – 2012-08-13 (×5): 2 mg via ORAL
  Filled 2012-08-12 (×5): qty 1

## 2012-08-12 MED ORDER — INSULIN ASPART 100 UNIT/ML ~~LOC~~ SOLN: 0.0000 [IU] | Freq: Three times a day (TID) | SUBCUTANEOUS | Status: DC

## 2012-08-12 MED ORDER — SODIUM CHLORIDE 0.45 % IV SOLN
INTRAVENOUS | Status: DC
  Administered 2012-08-12 – 2012-08-14 (×2): via INTRAVENOUS
  Filled 2012-08-12 (×5): qty 1000

## 2012-08-12 MED ORDER — ENOXAPARIN SODIUM 40 MG/0.4ML ~~LOC~~ SOLN
40.0000 mg | SUBCUTANEOUS | Status: DC
  Administered 2012-08-12 – 2012-08-14 (×3): 40 mg via SUBCUTANEOUS
  Filled 2012-08-12 (×4): qty 0.4

## 2012-08-12 MED ORDER — SACCHAROMYCES BOULARDII 250 MG PO CAPS
500.0000 mg | ORAL_CAPSULE | Freq: Two times a day (BID) | ORAL | Status: DC
  Administered 2012-08-12 – 2012-08-15 (×6): 500 mg via ORAL
  Filled 2012-08-12 (×7): qty 2

## 2012-08-12 MED ORDER — INSULIN ASPART 100 UNIT/ML ~~LOC~~ SOLN
0.0000 [IU] | Freq: Three times a day (TID) | SUBCUTANEOUS | Status: DC
  Administered 2012-08-13: 3 [IU] via SUBCUTANEOUS
  Administered 2012-08-14: 2 [IU] via SUBCUTANEOUS
  Administered 2012-08-14 (×2): 3 [IU] via SUBCUTANEOUS
  Administered 2012-08-15: 2 [IU] via SUBCUTANEOUS
  Administered 2012-08-15: 3 [IU] via SUBCUTANEOUS

## 2012-08-12 NOTE — Progress Notes (Signed)
Occupational Therapy Treatment Patient Details Name: Audrey Reed MRN: [id number] DOB: 1918-06-15 Today's Date: 08/12/2012 Time: 4098-1191 OT Time Calculation (min): 28 min  OT Assessment / Plan / Recommendation Comments on Treatment Session Pt appropriate this am.  Able to state place and date, also following all one and two step commands without difficulty.  Still exhibits decreased overall balance and endurance for selfcare tasks.  O2 sats decrease to 85% with mobility in the room on room air.  Needs 3Ls supplemental O2 to increase back to 95% as well as 1 minute of rest.    Follow Up Recommendations  SNF;Supervision/Assistance - 24 hour       Equipment Recommendations  None recommended by OT       Frequency Min 2X/week   Plan Discharge plan remains appropriate    Precautions / Restrictions Precautions Precautions: Fall Restrictions Weight Bearing Restrictions: No   Pertinent Vitals/Pain No report of pain, O2 sats on 3Ls at 96%, on room air at rest 91%, with activity on room air 85%    ADL  Grooming: Performed;Minimal assistance Where Assessed - Grooming: Supported standing Toilet Transfer: Performed;Moderate assistance;Other (comment) (hand held assist secondary to no assistive device) Toilet Transfer Method: Other (comment) (ambulating) Toilet Transfer Equipment: Comfort height toilet;Grab bars Toileting - Clothing Manipulation and Hygiene: Performed;Moderate assistance Where Assessed - Toileting Clothing Manipulation and Hygiene: Sit to stand from 3-in-1 or toilet Equipment Used: Gait belt Transfers/Ambulation Related to ADLs: Pt required mod assist for mobility to the sink and to the bathroom with hand held assist. ADL Comments: Pt alert and oriented to place and time.  Able to follow one step commands consistently.  Still exhibits decreased static and dynamic standing balance for selfcare tasks.  Pt also incontinent of bowel in standing and with mobility.       OT  Goals ADL Goals ADL Goal: Grooming - Progress: Progressing toward goals ADL Goal: Toilet Transfer - Progress: Progressing toward goals ADL Goal: Toileting - Clothing Manipulation - Progress: Progressing toward goals ADL Goal: Toileting - Hygiene - Progress: Progressing toward goals  Visit Information  Last OT Received On: 08/12/12 Assistance Needed: +1    Subjective Data  Subjective: I'm at [facility] Patient Stated Goal: did not state      Cognition  Overall Cognitive Status: Appears within functional limits for tasks assessed/performed Arousal/Alertness: Awake/alert Orientation Level: Disoriented to;Situation Behavior During Session: [facility] for tasks performed Current Attention Level: Sustained    Mobility  Shoulder Instructions Bed Mobility Bed Mobility: Supine to Sit Supine to Sit: With rails;5: Supervision Sitting - Scoot to Edge of Bed: 4: Min assist Transfers Transfers: Sit to Stand Sit to Stand: 3: Mod assist;From toilet;With upper extremity assist Stand to Sit: 4: Min assist;With upper extremity assist;To chair/3-in-1          Balance Balance Balance Assessed: Yes Static Sitting Balance Static Sitting - Balance Support: No upper extremity supported Static Sitting - Level of Assistance: 5: Stand by assistance Static Standing Balance Static Standing - Balance Support: Right upper extremity supported;Left upper extremity supported Static Standing - Level of Assistance: 5: Stand by assistance Dynamic Standing Balance Dynamic Standing - Balance Support: Left upper extremity supported Dynamic Standing - Level of Assistance: 3: Mod assist   End of Session OT - End of Session Equipment Utilized During Treatment: Gait belt Activity Tolerance: Patient limited by fatigue Patient left: in chair;with call bell/phone within reach;with family/visitor present Nurse Communication: Mobility status     Dylyn Mclaren OTR/L Pager  number [phone] 08/12/2012, 9:14  AM

## 2012-08-12 NOTE — Progress Notes (Addendum)
Patient Audrey Reed      DOB: Mar 24, 1918      EAV:[id number]   Palliative Medicine Team at [facility] Progress Note    Subjective:Patient up in bedside chair, alert, ate majority of breakfast, appears comfortable.   Filed Vitals:   08/12/12 0902  BP:   Pulse: 75  Temp:   Resp:    Physical exam: General: pleasant elderly female, in NAD HEENT: buccal mucosa moist CHEST: coarse scattered rhonchi CVS: RRR, no audible murmurs ABD: BS audible EXT tr-1+ pedal edema NEURO: a/o  Assessment and plan: Patient overall status improved, currently with diarrhea, C-Diff PCR negative. Possibly due to oral antibiotics.  1) Code Status: DNR/DNI  2) Dysphagia: Dysphagia 1 diet, eating well  3) Diarrhea: ? Antibiotic induced, to be initiated on Immodium 4) Disposition: plan is for SNF/rehabilitation with Palliative Care Services to follow   Time In Time Out Total Time Spent with Patient Total Overall Time  9:00a 9:15a 15 min 15 min   Freddie Breech, CNS-C Palliative Medicine Team [facility] Team Phone: [phone] Pager: [phone]

## 2012-08-12 NOTE — Progress Notes (Signed)
Physical Therapy Treatment Patient Details Name: Audrey Reed MRN: [id number] DOB: 02-27-1918 Today's Date: 08/12/2012 Time: 6213-0865 PT Time Calculation (min): 14 min  PT Assessment / Plan / Recommendation Comments on Treatment Session  Pt adm with confusion and UTI.  Pt much improved since last week.  Will still need ST-SNF.    Follow Up Recommendations  SNF     Does the patient have the potential to tolerate intense rehabilitation     Barriers to Discharge        Equipment Recommendations  None recommended by PT    Recommendations for Other Services    Frequency Min 2X/week   Plan Discharge plan remains appropriate;Frequency remains appropriate    Precautions / Restrictions Precautions Precautions: Fall Restrictions Weight Bearing Restrictions: No   Pertinent Vitals/Pain No c/o's pain.    Mobility  Bed Mobility Bed Mobility: Supine to Sit Supine to Sit: With rails;5: Supervision Sitting - Scoot to Edge of Bed: 4: Min assist Transfers Sit to Stand: 3: Mod assist;With upper extremity assist;With armrests;From chair/3-in-1 Stand to Sit: 4: Min assist;With upper extremity assist;With armrests;To chair/3-in-1 Details for Transfer Assistance: Verbal/tactile cues for hand placement.  Assist to lift hips. Ambulation/Gait Ambulation/Gait Assistance: 4: Min assist Ambulation Distance (Feet): 50 Feet (50' x1, 40' x 1) Assistive device: Rolling walker Ambulation/Gait Assistance Details: verbal cues to stay closer to walker and stand closer to walker. Gait Pattern: Step-through pattern;Decreased stride length Gait velocity: decr    Exercises     PT Diagnosis:    PT Problem List:   PT Treatment Interventions:     PT Goals Acute Rehab PT Goals PT Goal: Sit to Stand - Progress: Progressing toward goal PT Goal: Stand to Sit - Progress: Progressing toward goal PT Goal: Ambulate - Progress: Progressing toward goal  Visit Information  Last PT Received On:  08/12/12 Assistance Needed: +1    Subjective Data  Subjective: "That's what they say," pt stated when I told her she looked better than last week.   Cognition  Overall Cognitive Status: Appears within functional limits for tasks assessed/performed Arousal/Alertness: Awake/alert Orientation Level: Disoriented to;Situation Behavior During Session: [facility] for tasks performed Current Attention Level: Sustained Following Commands: Follows multi-step commands consistently    Balance  Balance Balance Assessed: Yes Static Sitting Balance Static Sitting - Balance Support: No upper extremity supported Static Sitting - Level of Assistance: 5: Stand by assistance Static Standing Balance Static Standing - Balance Support: Bilateral upper extremity supported (on walker) Static Standing - Level of Assistance: 5: Stand by assistance Dynamic Standing Balance Dynamic Standing - Balance Support: Left upper extremity supported Dynamic Standing - Level of Assistance: 3: Mod assist  End of Session PT - End of Session Equipment Utilized During Treatment: Gait belt Activity Tolerance: Patient tolerated treatment well Patient left: in chair;with call bell/phone within reach;with family/visitor present Nurse Communication: Mobility status   GP     Jeanmarc Viernes 08/12/2012, 12:08 PM  [employer] PT [phone]

## 2012-08-12 NOTE — Progress Notes (Signed)
PATIENT DETAILS Name: Audrey Reed Age: 79 y.o. Sex: female Date of Birth: [birth year] Admit Date: 08/04/2012 Admitting Physician Joseph Art, DO ZOX:WRUEA,VWUJW M, MD  Subjective:  Much more alert today, still has diarrhea  Assessment/Plan: Principal Problem: Acute Resp Failure -2/2 aspiration PNA -continue with O2 as tolerated, prn suction -continue with Antibioitcs -c/w nebs  Active Problems: Aspiration PNA - c/w Augmentin -suction prn. Seen by SLP this admission -on dysphagia 1 diet-family accepting aspiration risks  Diarrhea -? Antibiotic related - Stool C. difficile PCR negative - On a probiotic - no improvement inspite of stopping Vanco and Zosyn, start trial of Imodium  UTI -pan sensitive E Coli on urine culture - On Augmentin  Hypernatremia -2/2 dehydration-from poor oral intake -c/w D5W - This has  resolved with hydration -check lytes periodically  Hypokalemia - Will repeat and recheck electrolytes  AMS -2/2 toxic metabolic encephalopathy-from PNA/UTI/Metabolic derangements -probable underlying dementia  ? Seizures -MRI Brain neg -appreciate Neuro input  DM -CBG's stable -c/w SSI  HTN -controlled with Coreg-with parameters - Hold the lisinopril for now  Thrombocytopenia -probably from sepesis from UTI/PNA -monitor for now- slowly resolving, platelet count now increased to 126  Failure to Thrive Synd -poor overall prognosis -d/w Son-Jerry Plasencia on the phone(cell- [id number])-poor prognosis explained-code status discussed-he agrees to DNR - Appreciate palliative care input  Disposition: Remain inpatient but transfer back to non tele bed-needs SNF on discharge  DVT Prophylaxis: Prophylactic Lovenox  Code Status:  DNR  Procedures:  None  CONSULTS:  None  PHYSICAL EXAM: Vital signs in last 24 hours: Filed Vitals:   08/12/12 0840 08/12/12 0902 08/12/12 1010 08/12/12 1209  BP:   130/41   Pulse:  75 65   Temp:   97.7 F  (36.5 C)   TempSrc:   Oral   Resp:   20   Height:      Weight:      SpO2: 96% 96% 98% 98%    Weight change: 2.026 kg (4 lb 7.5 oz) Body mass index is 29.10 kg/(m^2).   Gen Exam: Awake, some confusion  Neck: Supple, No JVD.   Chest:B/L clear CVS: S1 S2 Regular, no murmurs.  Abdomen: soft, BS +, non tender, non distended.  Extremities: trace edema, lower extremities warm to touch. Neurologic: Non Focal.   Skin: No Rash.   Wounds: N/A.    Intake/Output from previous day:  Intake/Output Summary (Last 24 hours) at 08/12/12 1313 Last data filed at 08/12/12 1057  Gross per 24 hour  Intake 1032.67 ml  Output      0 ml  Net 1032.67 ml     LAB RESULTS: CBC  Lab 08/11/12 1018 08/10/12 0745 08/09/12 0510 08/08/12 0846 08/07/12 0533  WBC 11.8* 12.0* 14.4* 17.7* 11.0*  HGB 11.0* 11.1* 12.3 13.0 10.6*  HCT 33.9* 33.8* 37.0 39.1 32.0*  PLT 126* 88* 81* 82* 97*  MCV 87.6 86.9 86.9 87.3 86.0  MCH 28.4 28.5 28.9 29.0 28.5  MCHC 32.4 32.8 33.2 33.2 33.1  RDW 18.0* 17.9* 18.1* 18.2* 18.1*  LYMPHSABS -- -- -- -- --  MONOABS -- -- -- -- --  EOSABS -- -- -- -- --  BASOSABS -- -- -- -- --  BANDABS -- -- -- -- --    Chemistries   Lab 08/12/12 0723 08/11/12 1018 08/10/12 0745 08/09/12 0510 08/08/12 0846  NA 146* 144 145 142 146*  K 3.6 3.3* 3.4* 3.2* 3.5  CL 113* 108 107 104 106  CO2 26  26 26 27 25   GLUCOSE 99 201* 155* 143* 156*  BUN 17 22 24* 24* 30*  CREATININE 0.90 1.16* 1.14* 1.19* 1.17*  CALCIUM 8.1* 8.0* 8.5 9.0 9.4  MG -- -- -- -- --    CBG:  Lab 08/12/12 1143 08/12/12 0758 08/12/12 0418 08/12/12 0003 08/11/12 2113  GLUCAP 212* 103* 124* 102* 153*    GFR Estimated Creatinine Clearance: 38.5 ml/min (by C-G formula based on Cr of 0.9).  Coagulation profile No results found for this basename: INR:5,PROTIME:5 in the last 168 hours  Cardiac Enzymes No results found for this basename: CK:3,CKMB:3,TROPONINI:3,MYOGLOBIN:3 in the last 168 hours  No components  found with this basename: POCBNP:3 No results found for this basename: DDIMER:2 in the last 72 hours No results found for this basename: HGBA1C:2 in the last 72 hours No results found for this basename: CHOL:2,HDL:2,LDLCALC:2,TRIG:2,CHOLHDL:2,LDLDIRECT:2 in the last 72 hours No results found for this basename: TSH,T4TOTAL,FREET3,T3FREE,THYROIDAB in the last 72 hours No results found for this basename: VITAMINB12:2,FOLATE:2,FERRITIN:2,TIBC:2,IRON:2,RETICCTPCT:2 in the last 72 hours No results found for this basename: LIPASE:2,AMYLASE:2 in the last 72 hours  Urine Studies No results found for this basename: UACOL:2,UAPR:2,USPG:2,UPH:2,UTP:2,UGL:2,UKET:2,UBIL:2,UHGB:2,UNIT:2,UROB:2,ULEU:2,UEPI:2,UWBC:2,URBC:2,UBAC:2,CAST:2,CRYS:2,UCOM:2,BILUA:2 in the last 72 hours  MICROBIOLOGY: Recent Results (from the past 240 hour(s))  URINE CULTURE     Status: Normal   Collection Time   08/04/12 11:15 AM      Component Value Range Status Comment   Specimen Description URINE, CATHETERIZED   Final    Special Requests NONE   Final    Culture  Setup Time 08/04/2012 18:59   Final    Colony Count >=100,000 COLONIES/ML   Final    Culture ESCHERICHIA COLI   Final    Report Status 08/06/2012 FINAL   Final    Organism ID, Bacteria ESCHERICHIA COLI   Final   CULTURE, BLOOD (ROUTINE X 2)     Status: Normal   Collection Time   08/04/12 11:30 AM      Component Value Range Status Comment   Specimen Description BLOOD LEFT HAND   Final    Special Requests BOTTLES DRAWN AEROBIC AND ANAEROBIC 10CC   Final    Culture  Setup Time 08/04/2012 18:44   Final    Culture NO GROWTH 5 DAYS   Final    Report Status 08/10/2012 FINAL   Final   CULTURE, BLOOD (ROUTINE X 2)     Status: Normal   Collection Time   08/04/12  1:15 PM      Component Value Range Status Comment   Specimen Description BLOOD LEFT HAND   Final    Special Requests BOTTLES DRAWN AEROBIC AND ANAEROBIC [facility]   Final    Culture  Setup Time 08/04/2012 18:44    Final    Culture NO GROWTH 5 DAYS   Final    Report Status 08/10/2012 FINAL   Final   MRSA PCR SCREENING     Status: Normal   Collection Time   08/08/12 12:53 AM      Component Value Range Status Comment   MRSA by PCR NEGATIVE  NEGATIVE Final   CLOSTRIDIUM DIFFICILE BY PCR     Status: Normal   Collection Time   08/10/12  9:28 AM      Component Value Range Status Comment   C difficile by pcr NEGATIVE  NEGATIVE Final     RADIOLOGY STUDIES/RESULTS: Ct Head Wo Contrast  08/04/2012  *RADIOLOGY REPORT*  Clinical Data:  The patient was found on the floor  CT HEAD WITHOUT CONTRAST CT CERVICAL SPINE WITHOUT CONTRAST  Technique:  Multidetector CT imaging of the head and cervical spine was performed following the standard protocol without intravenous contrast.  Multiplanar CT image reconstructions of the cervical spine were also generated.  Comparison:  08/04/2012  CT HEAD  Findings: There is diffuse patchy low density throughout the subcortical and periventricular white matter consistent with chronic small vessel ischemic change.  There is prominence of the sulci and ventricles consistent with brain atrophy.  There is no evidence for acute brain infarct, hemorrhage or mass.  Retention cyst versus polyp noted in the right maxillary sinus. The remaining paranasal sinuses are clear.  The mastoid air cells are clear the skull appears intact.  IMPRESSION:  1.  Small vessel ischemic change and brain atrophy. 2.  No acute intracranial abnormalities.  CT CERVICAL SPINE  Findings: Normal alignment of the cervical spine.  The vertebral body heights and disc spaces are well preserved.  Bones are osteopenic.  There is no fracture or subluxation identified. Scarring noted within the anterior right apex.  Carotid artery calcifications are noted bilaterally.  IMPRESSION:  1.  No acute findings. 2.  Carotid artery calcifications.   Original Report Authenticated By: Signa Kell, M.D.    Ct Cervical Spine Wo  Contrast  08/04/2012  *RADIOLOGY REPORT*  Clinical Data:  The patient was found on the floor  CT HEAD WITHOUT CONTRAST CT CERVICAL SPINE WITHOUT CONTRAST  Technique:  Multidetector CT imaging of the head and cervical spine was performed following the standard protocol without intravenous contrast.  Multiplanar CT image reconstructions of the cervical spine were also generated.  Comparison:  08/04/2012  CT HEAD  Findings: There is diffuse patchy low density throughout the subcortical and periventricular white matter consistent with chronic small vessel ischemic change.  There is prominence of the sulci and ventricles consistent with brain atrophy.  There is no evidence for acute brain infarct, hemorrhage or mass.  Retention cyst versus polyp noted in the right maxillary sinus. The remaining paranasal sinuses are clear.  The mastoid air cells are clear the skull appears intact.  IMPRESSION:  1.  Small vessel ischemic change and brain atrophy. 2.  No acute intracranial abnormalities.  CT CERVICAL SPINE  Findings: Normal alignment of the cervical spine.  The vertebral body heights and disc spaces are well preserved.  Bones are osteopenic.  There is no fracture or subluxation identified. Scarring noted within the anterior right apex.  Carotid artery calcifications are noted bilaterally.  IMPRESSION:  1.  No acute findings. 2.  Carotid artery calcifications.   Original Report Authenticated By: Signa Kell, M.D.    Mr Brain Wo Contrast  08/05/2012  *RADIOLOGY REPORT*  Clinical Data: Unsteady gait.  Garbled speech.  Right-sided facial droop.  Diabetic.  MRI HEAD WITHOUT CONTRAST  Technique:  Multiplanar, multiecho pulse sequences of the brain and surrounding structures were obtained according to standard protocol without intravenous contrast.  Comparison: 08/04/2012 CT.  No comparison MR.  Findings: No acute infarct.  No intracranial hemorrhage.  Moderate to marked small vessel disease type changes.  Global atrophy  without hydrocephalus.  No intracranial mass lesion detected on this unenhanced exam.  Major intracranial vascular structures are patent.  IMPRESSION: No acute infarct.  Moderate to marked small vessel disease type changes.   Original Report Authenticated By: Lacy Duverney, M.D.    Dg Chest Port 1 View  08/08/2012  *RADIOLOGY REPORT*  Clinical Data: Worsening O2 saturation.  PORTABLE CHEST -  1 VIEW  Comparison: Chest radiograph performed 08/06/2012  Findings: Since the recent prior study, there has been interval development of right upper and lower lung zone airspace opacification, with persistent left basilar airspace opacity, compatible with worsening multifocal pneumonia.  A small left pleural effusion is again seen.  No pneumothorax is identified.  The cardiomediastinal silhouette is borderline normal in size. Calcification is noted in the aortic arch.  No acute osseous abnormalities are identified.  IMPRESSION: Significantly worsening bilateral pneumonia noted; small left pleural effusion seen.  These results were called by telephone on 08/08/2012 at 12:03 a.m. to [facility] on [id number], who verbally acknowledged these results.   Original Report Authenticated By: Tonia Ghent, M.D.    Dg Chest Port 1 View  08/06/2012  *RADIOLOGY REPORT*  Clinical Data: Shortness of breath, cough.  PORTABLE CHEST - 1 VIEW  Comparison: 08/04/2012  Findings: Bibasilar airspace opacities, likely atelectasis.  Small layering effusions bilaterally.  Heart is borderline in size. Diffuse peribronchial thickening.  Stable chronic interstitial prominence. No acute bony abnormality.  IMPRESSION: Stable chronic bronchitic changes and interstitial disease.  Bibasilar atelectasis with small bilateral effusions.   Original Report Authenticated By: Charlett Nose, M.D.    Dg Chest Port 1 View  08/04/2012  *RADIOLOGY REPORT*  Clinical Data: Short of breath, chest pain  PORTABLE CHEST - 1 VIEW  Comparison: Chest radiograph 06/09/2004   Findings: Normal mediastinum and heart silhouette.  There is chronic bronchitic markings centrally.  There is a chronic interstitial pattern which is increased compared to prior.  No focal consolidation.  No pneumothorax.  IMPRESSION: Chronic interstitial pattern is increased compared to prior exam from 2005.   Original Report Authenticated By: Genevive Bi, M.D.     MEDICATIONS: Scheduled Meds:    . albuterol  2.5 mg Nebulization QID  . amoxicillin-clavulanate  500 mg Oral Q12H  . carvedilol  12.5 mg Oral BID WC  . enoxaparin (LOVENOX) injection  30 mg Subcutaneous Q24H  . feeding supplement  1 Container Oral TID BM  . insulin aspart  0-15 Units Subcutaneous Q4H  . saccharomyces boulardii  250 mg Oral BID  . sodium chloride  3 mL Intravenous Q12H   Continuous Infusions:   PRN Meds:.acetaminophen, acetaminophen, albuterol, atropine, bisacodyl, food thickener, loperamide, ondansetron (ZOFRAN) IV, ondansetron  Antibiotics: Anti-infectives     Start     Dose/Rate Route Frequency Ordered Stop   08/11/12 1415   amoxicillin-clavulanate (AUGMENTIN) 250-62.5 MG/5ML suspension 500 mg        500 mg Oral Every 12 hours 08/11/12 1403     08/11/12 1330   amoxicillin-clavulanate (AUGMENTIN) 500-125 MG per tablet 500 mg  Status:  Discontinued        1 tablet Oral 2 times daily 08/11/12 1221 08/11/12 1403   08/11/12 0400   vancomycin (VANCOCIN) 1,500 mg in sodium chloride 0.9 % 500 mL IVPB  Status:  Discontinued        1,500 mg 250 mL/hr over 120 Minutes Intravenous Every 24 hours 08/11/12 0323 08/11/12 1221   08/08/12 0300   piperacillin-tazobactam (ZOSYN) IVPB 3.375 g  Status:  Discontinued        3.375 g 12.5 mL/hr over 240 Minutes Intravenous 3 times per day 08/08/12 0210 08/11/12 1221   08/08/12 0300   vancomycin (VANCOCIN) 750 mg in sodium chloride 0.9 % 150 mL IVPB  Status:  Discontinued        750 mg 150 mL/hr over 60 Minutes Intravenous Every 24 hours 08/08/12 0210 08/11/12  0321    08/05/12 0000   cefTRIAXone (ROCEPHIN) 1 g in dextrose 5 % 50 mL IVPB  Status:  Discontinued        1 g 100 mL/hr over 30 Minutes Intravenous Every 24 hours 08/04/12 1612 08/04/12 2026   08/04/12 1215   cefTRIAXone (ROCEPHIN) 1 g in dextrose 5 % 50 mL IVPB  Status:  Discontinued        1 g 100 mL/hr over 30 Minutes Intravenous Every 24 hours 08/04/12 1207 08/08/12 0146           Jeoffrey Massed, MD  [facility] Pager:[phone]  If 7PM-7AM, please contact night-coverage [email or web address] Password [email or web address] 08/12/2012, 1:13 PM   LOS: 8 days

## 2012-08-12 NOTE — Progress Notes (Signed)
Patient transferred to this CSW today- plans are confirmed with [facility] for SNF admission (tentatively tomorrow).Advised SNF of probable d/c and they are prepared for her -will f/u tomorrow for final d/c arrangements.  Reece Levy, MSW, [facility] [phone]

## 2012-08-12 NOTE — Procedures (Signed)
EEG NUMBER:  REFERRING PHYSICIAN:  Dr. Benjamine Mola.  HISTORY:  A 79 year old female with altered mental status.  MEDICATIONS:  Xanax, Coreg, Lasix, Glucophage, Rocephin, NovoLog, Zofran.  CONDITIONS OF RECORDING:  This is a 16-channel EEG carried out with the patient in the awake state.  DESCRIPTION:  The background activity is marred by muscle and movement artifact.  On occasion when the background rhythm can be evaluated. Posterior background rhythm consistent with symmetrical fairly well- organized 8 Hz alpha rhythm seen from the parieto-occipital and posterotemporal regions.  Theta and alpha rhythms was seen from the central and temporal regions.  Faster frequencies are noted anteriorly. Hyperventilation and intermittent photic stimulation were not performed. The patient does not drowse or sleep.  IMPRESSION:  This is a normal awake EEG.  No epileptiform activity was noted.  COMMENT:  Muscle and movement artifact prominent during the tracing, and prevented subtle abnormalities from being appreciated.  COMMENT:  An EEG with the patient sleep deprived to elicit drowse and light sleep may be desirable to further elicit a possible seizure disorder          ______________________________ Thana Farr, MD    [id number] D:  08/08/2012 07:41:42  T:  08/08/2012 07:55:34  Job #:  [id number]

## 2012-08-13 DIAGNOSIS — R5381 Other malaise: Secondary | ICD-10-CM

## 2012-08-13 DIAGNOSIS — R197 Diarrhea, unspecified: Secondary | ICD-10-CM

## 2012-08-13 LAB — GLUCOSE, CAPILLARY: Glucose-Capillary: 137 mg/dL — ABNORMAL HIGH (ref 70–99)

## 2012-08-13 MED ORDER — CHOLESTYRAMINE LIGHT 4 G PO PACK
4.0000 g | PACK | Freq: Two times a day (BID) | ORAL | Status: DC
  Filled 2012-08-13 (×2): qty 1

## 2012-08-13 MED ORDER — CHOLESTYRAMINE LIGHT 4 G PO PACK
4.0000 g | PACK | Freq: Two times a day (BID) | ORAL | Status: DC
  Administered 2012-08-13 – 2012-08-14 (×2): 4 g via ORAL
  Filled 2012-08-13 (×4): qty 1

## 2012-08-13 MED ORDER — DEXTROSE 5 % IV SOLN
INTRAVENOUS | Status: DC
  Administered 2012-08-13: 40 mL via INTRAVENOUS

## 2012-08-13 NOTE — Progress Notes (Addendum)
[facility] PROGRESS NOTE  Audrey Reed ZOX:[id number] DOB: 1918-01-16 DOA: 08/04/2012 PCP: Michele Mcalpine, MD  Assessment/Plan: Acute Resp Failure  -2/2 aspiration PNA  -sats >90% will wean 02 as able  -Antibioitcs discontinued due to severe diarrhea -pt afebrile. non-toxic appearing -c/w nebs   Active Problems:  Aspiration PNA  - Augmentin discontinued 08/11/12 due to diarrhea -suction prn. Seen by SLP this admission  - continue on dysphagia 1 diet-family accepting aspiration risks   Diarrhea  -continues-stools not watery-but soft -Likely Antibiotic related -have now stoppped all antibiotics-she has had a total of >7 days of antibiotics - Stool C. difficile PCR negative  - On a probiotic and imodium -flexiseal to day due to continued loose stool - trial of questran today -if still no improvement-will need GI eval-antibiotic stopped 1/6-so will give another 24 hours  UTI  -pan sensitive E Coli on urine culture  -  augmentin discontinued. Pt s/p 9 days of antibiotics currently severe diarrhea.  - pt afebrile, non-toxic appearing  Hypernatremia  -2/2 dehydration-from poor oral intake  -- Trending up this am to 146. Will recheck in am and support with gentle IV fluids   Skin Rash/perineum -related to frequent watery stool -treating diarrhe -topical barriers to area -flexiseal as needed -keep clean and dry   Hypokalemia  - WNL this am. Will monitor closely given #3 - Will repeat and recheck electrolytes   AMS  -2/2 toxic metabolic encephalopathy-from PNA/UTI/Metabolic derangements  -probable underlying dementia  -appears at baseline this am  ? Seizures  -MRI Brain neg  -appreciate Neuro input  -no further seizure activity  DM  -CBG's stable range 106-162 -c/w SSI   HTN  -controlled with Coreg-with parameters  -SBP range 130-161 -  Continue to hold the lisinopril    Thrombocytopenia  -probably from sepesis from UTI/PNA  -monitor for now- slowly  resolving, platelet count now increased to 126   Failure to Thrive Synd  -poor overall prognosis  -d/w Son-Jerry Hallenbeck on the phone(cell- [phone])-poor prognosis explained-code status discussed-he agrees to DNR  - Appreciate palliative care input  Code Status: DNR Family Communication:  Disposition Plan: To SNF when diarrhea clears with palliative care   Consultants:  none  Procedures:  none  Antibiotics:    HPI/Subjective: Awake denies pain/discomfort   Objective: Filed Vitals:   08/12/12 2005 08/12/12 2126 08/12/12 2238 08/13/12 0500  BP:  161/55 145/72 153/41  Pulse: 65   59  Temp:  98.1 F (36.7 C)  98.7 F (37.1 C)  TempSrc:  Oral  Oral  Resp: 18 19  18   Height:      Weight:      SpO2: 98% 100%  99%    Intake/Output Summary (Last 24 hours) at 08/13/12 0751 Last data filed at 08/12/12 1845  Gross per 24 hour  Intake    803 ml  Output      0 ml  Net    803 ml   Filed Weights   08/09/12 0400 08/11/12 0418 08/12/12 0616  Weight: 71.1 kg (156 lb 12 oz) 72.5 kg (159 lb 13.3 oz) 74.526 kg (164 lb 4.8 oz)    Exam:   General:  Oriented to self alert NAD  Cardiovascular: RRR No MGR No LEE  Respiratory: normal effort BSCTAB No wheeze  Abdomen: soft +BS non-tender to palpation  Data Reviewed: Basic Metabolic Panel:  Lab 08/12/12 8119 08/11/12 1018 08/10/12 0745 08/09/12 0510 08/08/12 0846  NA 146* 144 145 142 146*  K 3.6 3.3* 3.4* 3.2* 3.5  CL 113* 108 107 104 106  CO2 26 26 26 27 25   GLUCOSE 99 201* 155* 143* 156*  BUN 17 22 24* 24* 30*  CREATININE 0.90 1.16* 1.14* 1.19* 1.17*  CALCIUM 8.1* 8.0* 8.5 9.0 9.4  MG -- -- -- -- --  PHOS -- -- -- -- --   Liver Function Tests:  Lab 08/08/12 0846  AST 32  ALT 31  ALKPHOS 55  BILITOT 1.2  PROT 6.6  ALBUMIN 3.4*   No results found for this basename: LIPASE:5,AMYLASE:5 in the last 168 hours No results found for this basename: AMMONIA:5 in the last 168 hours CBC:  Lab 08/11/12 1018 08/10/12  0745 08/09/12 0510 08/08/12 0846 08/07/12 0533  WBC 11.8* 12.0* 14.4* 17.7* 11.0*  NEUTROABS -- -- -- -- --  HGB 11.0* 11.1* 12.3 13.0 10.6*  HCT 33.9* 33.8* 37.0 39.1 32.0*  MCV 87.6 86.9 86.9 87.3 86.0  PLT 126* 88* 81* 82* 97*   Cardiac Enzymes: No results found for this basename: CKTOTAL:5,CKMB:5,CKMBINDEX:5,TROPONINI:5 in the last 168 hours BNP (last 3 results)  Basename 08/08/12 0845  PROBNP 5467.0*   CBG:  Lab 08/12/12 2123 08/12/12 1704 08/12/12 1143 08/12/12 0758 08/12/12 0418  GLUCAP 162* 106* 212* 103* 124*    Recent Results (from the past 240 hour(s))  URINE CULTURE     Status: Normal   Collection Time   08/04/12 11:15 AM      Component Value Range Status Comment   Specimen Description URINE, CATHETERIZED   Final    Special Requests NONE   Final    Culture  Setup Time 08/04/2012 18:59   Final    Colony Count >=100,000 COLONIES/ML   Final    Culture ESCHERICHIA COLI   Final    Report Status 08/06/2012 FINAL   Final    Organism ID, Bacteria ESCHERICHIA COLI   Final   CULTURE, BLOOD (ROUTINE X 2)     Status: Normal   Collection Time   08/04/12 11:30 AM      Component Value Range Status Comment   Specimen Description BLOOD LEFT HAND   Final    Special Requests BOTTLES DRAWN AEROBIC AND ANAEROBIC 10CC   Final    Culture  Setup Time 08/04/2012 18:44   Final    Culture NO GROWTH 5 DAYS   Final    Report Status 08/10/2012 FINAL   Final   CULTURE, BLOOD (ROUTINE X 2)     Status: Normal   Collection Time   08/04/12  1:15 PM      Component Value Range Status Comment   Specimen Description BLOOD LEFT HAND   Final    Special Requests BOTTLES DRAWN AEROBIC AND ANAEROBIC [facility]   Final    Culture  Setup Time 08/04/2012 18:44   Final    Culture NO GROWTH 5 DAYS   Final    Report Status 08/10/2012 FINAL   Final   MRSA PCR SCREENING     Status: Normal   Collection Time   08/08/12 12:53 AM      Component Value Range Status Comment   MRSA by PCR NEGATIVE  NEGATIVE Final     CLOSTRIDIUM DIFFICILE BY PCR     Status: Normal   Collection Time   08/10/12  9:28 AM      Component Value Range Status Comment   C difficile by pcr NEGATIVE  NEGATIVE Final      Studies: Dg Chest Port 1  View  08/11/2012  *RADIOLOGY REPORT*  Clinical Data: Pneumonia  PORTABLE CHEST - 1 VIEW  Comparison: 08/09/2012  Findings: Right upper lobe airspace opacity and bibasilar opacities have improved.  Right lower lobe curvilinear atelectasis or possibly subpulmonic fluid noted.  Heart size is upper limits of normal.  IMPRESSION: Improvement in multilobar airspace consolidation, likely improving pneumonia.  Radiographic resolution may take 4-6 weeks and complete resolution may not be visible before that time.   Original Report Authenticated By: Christiana Pellant, M.D.     Scheduled Meds:   . albuterol  2.5 mg Nebulization QID  . carvedilol  12.5 mg Oral BID WC  . enoxaparin (LOVENOX) injection  40 mg Subcutaneous Q24H  . feeding supplement  1 Container Oral TID BM  . insulin aspart  0-15 Units Subcutaneous TID WC  . saccharomyces boulardii  500 mg Oral BID  . sodium chloride  3 mL Intravenous Q12H   Continuous Infusions:   . sodium chloride 0.45 % 1,000 mL with potassium chloride 20 mEq infusion 50 mL/hr at 08/12/12 1500    Principal Problem:  *UTI (lower urinary tract infection) Active Problems:  DM  ANXIETY DISORDER, GENERALIZED  Hypothermia  Dementia  Altered mental state  Thrombocytopenia  Hypokalemia  Hypoxia  Increased oropharyngeal secretions  Failure to thrive in adult    Time spent: 30 minutes    [facility] M  [facility]  If 8PM-8AM, please contact night-coverage at [email or web address], password [facility] 08/13/2012, 7:51 AM  LOS: 9 days    Attending I have seen and examined the patient, I agree with the assessment and plan as outlined above. Still with diarrhea, I stopped Augmentin on 1/6-she has had almost 8-9 days of antibiotics. Already on florastor and Imodium.  Trial of Questran today-if no improvement by 1/8-please consult GI.Will repeat C Diff PCR today-but if diarrhea continues may need a flex sigmoidiscopy She is otherwise more awake and alert, still with some rhonchi-but much better than the last few days.  S Davelle Anselmi

## 2012-08-13 NOTE — Progress Notes (Signed)
Patient not stable for d/c today per NP- I have updated grandson and [facility]- will f/u tomorrow for ?d/c. Reece Levy, MSW, Theresia Majors [phone]

## 2012-08-13 NOTE — Progress Notes (Signed)
NUTRITION FOLLOW UP  Intervention:   1. Pt may benefit from Probiotic supplement to help with diarrhea if it was abx induced.  2. Continue Ensure pudding TID 3. RD will continue to follow    Nutrition Dx:   Inadequate oral intake related to AMS as evidenced by 20% meal completion.    Goal:   PO intake to meet >/=90% estimated nutrition needs. progressing  Monitor:   PO intake, weight trends, I/O's  Assessment:   Palliative care now following pt, family does not want nutrition support. Pt to transition to SNF with palliative care once diarrhea improves.  S/p MBS on 1/3, high risk for aspiration and diet changed to D1-nectar. Meal completion is improving with improved mental status.  Now with flexi-seal for frequent soft stools with irritation. Possible abx related diarrhea? ABX d/c'd on 1/6.   Pt reports appetite is ok, getting pudding and willing to continue them.   Height: Ht Readings from Last 1 Encounters:  08/08/12 5\' 3"  (1.6 m)    Weight Status:   Wt Readings from Last 1 Encounters:  08/12/12 164 lb 4.8 oz (74.526 kg)    Re-estimated needs:  Kcal: 1350-1550 Protein: 55-65 gm  Fluid: 1.4-1.6 L/day  Skin: Stage II to heel   Diet Order: Dysphagia 1, nectar thick liquids    Intake/Output Summary (Last 24 hours) at 08/13/12 1112 Last data filed at 08/13/12 0917  Gross per 24 hour  Intake    920 ml  Output      0 ml  Net    920 ml    Last BM: 2x 1/7   Labs:   Lab 08/12/12 0723 08/11/12 1018 08/10/12 0745  NA 146* 144 145  K 3.6 3.3* 3.4*  CL 113* 108 107  CO2 26 26 26   BUN 17 22 24*  CREATININE 0.90 1.16* 1.14*  CALCIUM 8.1* 8.0* 8.5  MG -- -- --  PHOS -- -- --  GLUCOSE 99 201* 155*    CBG (last 3)   Basename 08/13/12 0748 08/12/12 2123 08/12/12 1704  GLUCAP 101* 162* 106*    Scheduled Meds:   . albuterol  2.5 mg Nebulization QID  . carvedilol  12.5 mg Oral BID WC  . cholestyramine light  4 g Oral Q12H  . enoxaparin (LOVENOX) injection   40 mg Subcutaneous Q24H  . feeding supplement  1 Container Oral TID BM  . insulin aspart  0-15 Units Subcutaneous TID WC  . saccharomyces boulardii  500 mg Oral BID  . sodium chloride  3 mL Intravenous Q12H    Continuous Infusions:   . dextrose 40 mL (08/13/12 1040)  . sodium chloride 0.45 % 1,000 mL with potassium chloride 20 mEq infusion 50 mL/hr at 08/12/12 1500    Clarene Duke RD, LDN Pager [phone] After Hours pager [phone]

## 2012-08-13 NOTE — Progress Notes (Signed)
Speech Language Pathology Dysphagia Treatment Patient Details Name: Audrey Reed MRN: [id number] DOB: 02/15/1918 Today's Date: 08/13/2012 Time: 4098-1191 SLP Time Calculation (min): 13 min  Assessment / Plan / Recommendation Clinical Impression  Patient appears to be tolerating current diet relatively well (no fevers, but lung sounds with fine crackles).  Patient took sips of nectar thick liquid without overt s/s of aspiration.  Patient did require moderate verbal cues to recall need for chin tuck.  Precautions were reviewed with patient (family not present at this time).  Per chart review it is noted that patient is being followed by palliative care, and family accepts aspiration risks.     Diet Recommendation  Continue with Current Diet: Dysphagia 1 (puree);Nectar-thick liquid    SLP Plan Discharge SLP treatment due to (comment) (family accepts aspiration risk; d/c to SNF)   Pertinent Vitals/Pain n/a   Swallowing Goals     General Temperature Spikes Noted: No Respiratory Status: Room air Behavior/Cognition: Alert;Confused Oral Cavity - Dentition: Edentulous Patient Positioning: Upright in chair  Oral Cavity - Oral Hygiene Does patient have any of the following "at risk" factors?: Diet - patient on thickened liquids Brush patient's teeth BID with toothbrush (using toothpaste with fluoride): Yes Patient is HIGH RISK - Oral Care Protocol followed (see row info): Yes Patient is AT RISK - Oral Care Protocol followed (see row info): Yes   Dysphagia Treatment Treatment focused on: Skilled observation of diet tolerance;Patient/family/caregiver education Treatment Methods/Modalities: Skilled observation Patient observed directly with PO's: Yes Type of PO's observed: Nectar-thick liquids Feeding: Needs assist Liquids provided via: Cup Type of cueing: Verbal Amount of cueing: Moderate   GO     Audrey Reed 08/13/2012, 2:09 PM

## 2012-08-13 NOTE — Progress Notes (Addendum)
Patient ZO:[id number] O Skalsky      DOB: 1918/03/30      EAV:[id number]   Palliative Medicine Team at [facility] Progress Note    Subjective:Patient sitting up eating breakfast, ate 60% of breakfast meal. Still continues to have bouts of loose stools, thought to be antibiotic induced.   Filed Vitals:   08/13/12 1337  BP: 124/58  Pulse: 66  Temp: 98.7 F (37.1 C)  Resp: 20   Physical exam: General: Alert, verbally responsive, c/o pain from excoriation of the buttocks HEENT: conjunctivae clear, edentulous, no oral lesions CHEST:CTA bialterally ABD: non-tender, BS audible EXT: trace pedal edema  Assessment and plan: Oral intake fair, main issue is persistent loose stools  1) Code Status: DNR 2) Dysphagia: appetite fair, no signs of coughing at breakfast meal 3) Loose Stools: attending service stopped antibiotics, on Probiotic, Imodium and Questran 4) Excoriated Buttocks: SensiCare cream as needed 5) Disposition: pending until loose stools controlled then discharge to SNF with PCS to follow  Time In Time Out Total Time Spent with Patient Total Overall Time  9:00a 9:15a 15 min 15 min   Freddie Breech, CNS-C Palliative Medicine Team [facility] Team Phone: [phone] Pager: [phone]

## 2012-08-14 DIAGNOSIS — R197 Diarrhea, unspecified: Secondary | ICD-10-CM

## 2012-08-14 LAB — GLUCOSE, CAPILLARY
Glucose-Capillary: 147 mg/dL — ABNORMAL HIGH (ref 70–99)
Glucose-Capillary: 152 mg/dL — ABNORMAL HIGH (ref 70–99)
Glucose-Capillary: 184 mg/dL — ABNORMAL HIGH (ref 70–99)

## 2012-08-14 LAB — BASIC METABOLIC PANEL
Chloride: 108 mEq/L (ref 96–112)
GFR calc Af Amer: 68 mL/min — ABNORMAL LOW (ref >90)
Potassium: 4.5 mEq/L (ref 3.5–5.1)

## 2012-08-14 LAB — CBC
HCT: 36.3 % (ref 36.0–46.0)
Hemoglobin: 11.7 g/dL — ABNORMAL LOW (ref 12.0–15.0)
WBC: 13.2 10*3/uL — ABNORMAL HIGH (ref 4.0–10.5)

## 2012-08-14 LAB — CLOSTRIDIUM DIFFICILE BY PCR: Toxigenic C. Difficile by PCR: NEGATIVE

## 2012-08-14 MED ORDER — LOPERAMIDE HCL 2 MG PO CAPS
2.0000 mg | ORAL_CAPSULE | Freq: Two times a day (BID) | ORAL | Status: DC
  Administered 2012-08-14 – 2012-08-15 (×3): 2 mg via ORAL
  Filled 2012-08-14 (×4): qty 1

## 2012-08-14 MED ORDER — CHOLESTYRAMINE 4 G PO PACK: 4.0000 g | PACK | Freq: Two times a day (BID) | ORAL | Status: DC

## 2012-08-14 MED ORDER — CHOLESTYRAMINE LIGHT 4 G PO PACK
4.0000 g | PACK | Freq: Every day | ORAL | Status: DC
  Administered 2012-08-15: 4 g via ORAL
  Filled 2012-08-14: qty 1

## 2012-08-14 NOTE — Progress Notes (Signed)
Addendum  Patient seen and examined, chart and data base reviewed.  I agree with the above assessment and plan.  For full details please see Mrs. Toya Smothers NP note.  Pansensitive Escherichia coli UTI, acute respiratory failure/aspiration pneumonia.  Acute issues resolved, continues to have severe diarrhea, GI to evaluate.   Clint Lipps, MD [facility] Pager: [phone] 08/14/2012, 2:58 PM

## 2012-08-14 NOTE — Consult Note (Signed)
[facility] Consult: 10:31 AM 08/14/2012   Referring Provider: Dr Arthor Captain Primary Care Physician:  Michele Mcalpine, MD Primary Gastroenterologist:  Dr. Lina Sar   Reason for Consultation:  C Diff PCR negative diarrhea.   HPI: Audrey Reed is a 79 y.o. female.  Admitted 08/04/12 after being found down.  Had been confused the previous day.  Temperature was 89.  Started on Rocephin for e coli UTI.. On 08/09/11  Had hypoxia, resp failure, increased confusion.  Felt to have aspiration PNA treated with Zosyn and Vancomycin then Augmentin.  All abx stopped 08/13/11    In last 5 or so days having frequent "soupy", non-bloody stools.  About 4 or 5 per day.  She is twice C diff negative.  No responding to prn Imodium day 3 using 2 to 3 doses per day, Cholestyramine day 2, Florastor .   Apparently had colonoscopy in 2004 but unable to locate any report including pathology.  Hx of colon polyps.   She has no belly pain.  She is tolerating a Dysphagia 1 diet without nausea.      Past Medical History  Diagnosis Date  . HTN (hypertension)   . CAD (coronary artery disease)   . Venous insufficiency   . Hypercholesterolemia   . DM (diabetes mellitus)   . Diverticulosis of colon   . Colon polyp   . Adenocarcinoma of breast   . DJD (degenerative joint disease)   . Osteoporosis   . Anxiety disorder     Past Surgical History  Procedure Date  . Cataract extraction   . Mastectomy     for breast cancer    Prior to Admission medications   Medication Sig Start Date End Date Taking? Authorizing Provider  alendronate (FOSAMAX) 70 MG tablet Take 70 mg by mouth every 7 (seven) days. Take with a full glass of water on an empty stomach. 04/22/12  Yes Michele Mcalpine, MD  ALPRAZolam Prudy Feeler) 0.5 MG tablet Take 0.25-0.5 mg by mouth 3 (three) times daily as needed. for nerves 06/20/12  Yes Michele Mcalpine, MD  calcium carbonate (CALCIUM 600) 600 MG TABS Take 600 mg by  mouth 2 (two) times daily with a meal.   Yes Historical Provider, MD  carvedilol (COREG) 12.5 MG tablet Take 12.5 mg by mouth 2 (two) times daily with a meal.   Yes Historical Provider, MD  Cholecalciferol (VITAMIN D3) 1000 UNITS CAPS Take 2 capsules by mouth daily.   Yes Historical Provider, MD  furosemide (LASIX) 40 MG tablet Take 40 mg by mouth daily.   Yes Historical Provider, MD  lisinopril (PRINIVIL,ZESTRIL) 40 MG tablet Take 1 tablet (40 mg total) by mouth daily. 04/22/12  Yes Michele Mcalpine, MD  metFORMIN (GLUCOPHAGE) 500 MG tablet Take 500 mg by mouth daily with breakfast.   Yes Historical Provider, MD  minoxidil (LONITEN) 10 MG tablet Take 10 mg by mouth daily. 07/01/12  Yes Tammy S Parrett, NP  Multiple Vitamins-Minerals (CENTRUM SILVER ADULT 50+ PO) Take 1 tablet by mouth daily.   Yes Historical Provider, MD  simvastatin (ZOCOR) 20 MG tablet Take 20 mg by mouth at bedtime.   Yes Historical Provider, MD    Scheduled Meds:    . albuterol  2.5 mg Nebulization QID  . carvedilol  12.5 mg Oral BID WC  . cholestyramine light  4 g Oral Q12H  . enoxaparin (LOVENOX) injection  40 mg Subcutaneous Q24H  . feeding supplement  1 Container Oral TID BM  .  insulin aspart  0-15 Units Subcutaneous TID WC  . saccharomyces boulardii  500 mg Oral BID  . sodium chloride  3 mL Intravenous Q12H   Infusions:    . sodium chloride 0.45 % 1,000 mL with potassium chloride 20 mEq infusion 50 mL/hr at 08/14/12 0549   PRN Meds: acetaminophen, acetaminophen, albuterol, bisacodyl, food thickener, loperamide, ondansetron (ZOFRAN) IV, ondansetron   Allergies as of 08/04/2012 - Review Complete 08/04/2012  Allergen Reaction Noted  . Nitrofurantoin    . Tramadol Nausea Only 02/15/2011  . Codeine Nausea And Vomiting, Swelling, and Rash     Family History  Problem Relation Age of Onset  . Heart disease Father   . Heart disease Mother   . Alzheimer's disease Sister   . Alzheimer's disease Sister   .  Heart attack Sister   . Heart attack Sister     History   Social History  . Marital Status: Widowed    Spouse Name: N/A    Number of Children: 1  . Years of Education: N/A   Occupational History  . retired from [employer] after 30+ years    Social History Main Topics  . Smoking status: Former Smoker    Quit date: 08/08/1987  . Smokeless tobacco: Not on file  . Alcohol Use: No  . Drug Use: Not on file  . Sexually Active: Not on file   Other Topics Concern  . Not on file   Social History Narrative   Husband died 20+ years ago2 sons - one died age 53 w/ kidney cancer1 son alive, [name] - he is next of kinCaffeine use: 2-3 cups per day4 siblings, all sisters - 2 sisters deceased1 sister is our patient = Audrey Reed    REVIEW OF SYSTEMS: Feels  She has lost weight despite good appetite as her clothes fit looser. No subjective choking or gagging on food. Normally wears full set of dentures No headaches.  No falls.  Was self ambulatory at home PTA Some memory problems.  Swelling in legs Cough and SOB are improved.   PHYSICAL EXAM: Vital signs in last 24 hours: Temp:  [97.3 F (36.3 C)-99.2 F (37.3 C)] 99.2 F (37.3 C) (01/08 0620) Pulse Rate:  [66-104] 104  (01/08 0800) Resp:  [18-20] 20  (01/08 0620) BP: (124-160)/(55-86) 159/86 mmHg (01/08 0800) SpO2:  [94 %-98 %] 94 % (01/08 0944) Weight:  [74.5 kg (164 lb 3.9 oz)-75.1 kg (165 lb 9.1 oz)] 74.5 kg (164 lb 3.9 oz) (01/08 4098)  General: elderly WF does not look her age though she is frail. Head:  Symmetric without swelling  Eyes:  No icterus or pallor.  EOMI Ears:  Slightly HOH  Nose:  No discharge or congestion Mouth:  Clear, slightly dry oral MM Neck:  No JVD, mass or bruits Lungs:  Crackles in bases.  No resp distress Heart: RRR.  No MRG Abdomen:  Soft, no mass or HSM.  No bruits.  Active BS.  Rectal: soft brown stool was not FOB tested.  No masses.  Rectal tone diminished.    Musc/Skeltl: kyphosis.     Extremities:  Swelling in hands and in feet and lower legs  Neurologic:  Pleasant, appropriate, oriented to place and self.  No tremor.  No gross deficits Skin:  No rash or sores.  Purpura on arms Tattoos:  none Nodes:  No inguinal adenopathy   Psych:  Pleasant, cooperative.   Intake/Output from previous day: 01/07 0701 - 01/08 0700 In: 933.3 [P.O.:800; I.V.:133.3]  Out: 550 [Urine:550] Intake/Output this shift: Total I/O In: 3 [I.V.:3] Out: -   LAB RESULTS:  Basename 08/14/12 0715  WBC 13.2*  HGB 11.7*  HCT 36.3  PLT 269   BMET Lab Results  Component Value Date   NA 142 08/14/2012   NA 146* 08/12/2012   NA 144 08/11/2012   K 4.5 08/14/2012   K 3.6 08/12/2012   K 3.3* 08/11/2012   CL 108 08/14/2012   CL 113* 08/12/2012   CL 108 08/11/2012   CO2 23 08/14/2012   CO2 26 08/12/2012   CO2 26 08/11/2012   GLUCOSE 135* 08/14/2012   GLUCOSE 99 08/12/2012   GLUCOSE 201* 08/11/2012   BUN 15 08/14/2012   BUN 17 08/12/2012   BUN 22 08/11/2012   CREATININE 0.84 08/14/2012   CREATININE 0.90 08/12/2012   CREATININE 1.16* 08/11/2012   CALCIUM 8.5 08/14/2012   CALCIUM 8.1* 08/12/2012   CALCIUM 8.0* 08/11/2012   LFT No results found for this basename: PROT:3,ALBUMIN:3,AST:3,ALT:3,ALKPHOS:3,BILITOT:3,BILIDIR:3,IBILI:3 in the last 72 hours PT/INR Lab Results  Component Value Date   INR 1.31 08/04/2012    C-Diff PCR negative on 08/10/12 and 08/13/12   RADIOLOGY STUDIES: No results found.  ENDOSCOPIC STUDIES: 2004 Colonoscopy  Dr Juanda Chance.   IMPRESSION: *  Diarrhea.  This may be lingering effect of Augmentin.  This may be ischemic colitis, though no abdominal pain and no blood grossly discounts this ddx.  *  Resolved thrombocytopenia *  Dysphagia *  Aspiration pneumonia.  Completed abx *  E coli UTI, completed abx *  Hx of breat cancer.  Left partial mastectomy 2005.  *  DM  PLAN: *  Will increase imodium to twice daily, scheduled Questran once daily.  *  No plans at present for endoscopic evaluation but if sxs  continue, consider flex sig.    LOS: 10 days   Jennye Moccasin  08/14/2012, 10:31 AM Pager: [phone]      ________________________________________________________________________  [facility] MD note:  I personally examined the patient, reviewed the data and agree with the assessment and plan described above.  79 yo woman with loose stools which started after admission several days ago, several antibiotics.  C. Diff neg by PCR and it would be a bit early for C. Diff due to antibiotics anyway.  More likely is antibiotic related loose stools (in absence of c. Diff).  I agree with imodium on scheduled basis and for her to be followed clinically.   Rob Bunting, MD [facility] Pager [phone]

## 2012-08-14 NOTE — Progress Notes (Signed)
[facility] PROGRESS NOTE  Audrey Reed WJX:[id number] DOB: [birth year] DOA: 08/04/2012 PCP: Michele Mcalpine, MD  Assessment/Plan: Diarrhea  -continues-stools three large "soupy" stools during night per nursing  -Likely Antibiotic related -have now stoppped all antibiotics-she has had a total of >7 days of antibiotics  - Stool C. difficile PCR negative 08/10/12. Repeat test results pending - On a probiotic and imodium  -rectal tube -- trial of questran started 08/13/12. Two doses so far.  -Consider GI eval-antibiotic stopped 1/6-so will give another 24 hours  UTI  -pan sensitive E Coli on urine culture  - augmentin discontinued. Pt s/p 9 days of antibiotics currently severe diarrhea.  - pt afebrile, non-toxic appearing  Acute Resp Failure  -2/2 aspiration PNA  -sats >96% on room air this am  -Antibioitcs discontinued due to severe diarrhea  -pt afebrile. non-toxic appearing  -c/w nebs  Active Problems:  Aspiration PNA  - Augmentin discontinued 08/11/12 due to diarrhea  -suction prn. Seen by SLP this admission  - continue on dysphagia 1 diet-family accepting aspiration risks  Hypernatremia  -2/2 dehydration-from poor oral intake  -- bmet pending this am. Will follow   Skin Rash/perineum  -related to frequent watery stool  -treating diarrhe  -topical barriers to area  -flexiseal as needed  -keep clean and dry  Hypokalemia  - WNL this am. Will monitor closely given #3  - Will repeat and recheck electrolytes  AMS  -2/2 toxic metabolic encephalopathy-from PNA/UTI/Metabolic derangements  -probable underlying dementia  -appears at baseline this am  ? 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 Denies pain discomfort. Verbalizes anxiety over diarrhea  Objective: Filed Vitals:   08/13/12 2020 08/13/12 2058 08/14/12 0500 08/14/12 0620  BP:  148/68  159/69  Pulse:  69  93  Temp:  98.2 F (36.8 C)  99.2 F (37.3 C)  TempSrc:  Oral  Oral  Resp:  20  20  Height:      Weight:   75.1 kg (165 lb 9.1 oz) 74.5 kg (164 lb 3.9 oz)  SpO2: 96% 98%  96%    Intake/Output Summary (Last 24 hours) at 08/14/12 0729 Last data filed at 08/13/12 1900  Gross per 24 hour  Intake 933.33 ml  Output    550 ml  Net 383.33 ml   Filed Weights   08/13/12 0756 08/14/12 0500 08/14/12 0620  Weight: 75.2 kg (165 lb 12.6 oz) 75.1 kg (165 lb 9.1 oz) 74.5 kg (164 lb 3.9 oz)    Exam:   General:  Awake alert oriented x3  Cardiovascular: RRR No MGR trace LEE PPP  Respiratory: normal effort BSCTAB but mildly coarse no wheeze  Abdomen: soft +BS non-tender to palpation  Skin: warm/dry perineum pink, somewhat excoriated, topical barrier present  Data Reviewed: Basic Metabolic Panel:  Lab 08/12/12 2130  08/11/12 1018 08/10/12 0745 08/09/12 0510 08/08/12 0846  NA 146* 144 145 142 146*  K 3.6 3.3* 3.4* 3.2* 3.5  CL 113* 108 107 104 106  CO2 26 26 26 27 25   GLUCOSE 99 201* 155* 143* 156*  BUN 17 22 24* 24* 30*  CREATININE 0.90 1.16* 1.14* 1.19* 1.17*  CALCIUM 8.1* 8.0* 8.5 9.0 9.4  MG -- -- -- -- --  PHOS -- -- -- -- --   Liver Function Tests:  Lab 08/08/12 0846  AST 32  ALT 31  ALKPHOS 55  BILITOT 1.2  PROT 6.6  ALBUMIN 3.4*   No results found for this basename:  LIPASE:5,AMYLASE:5 in the last 168 hours No results found for this basename: AMMONIA:5 in the last 168 hours CBC:  Lab 08/11/12 1018 08/10/12 0745 08/09/12 0510 08/08/12 0846  WBC 11.8* 12.0* 14.4* 17.7*  NEUTROABS -- -- -- --  HGB 11.0* 11.1* 12.3 13.0  HCT 33.9* 33.8* 37.0 39.1  MCV 87.6 86.9 86.9 87.3  PLT 126* 88* 81* 82*   Cardiac Enzymes: No results found for this basename: CKTOTAL:5,CKMB:5,CKMBINDEX:5,TROPONINI:5 in the last 168 hours BNP (last 3 results)  Basename 08/08/12 0845  PROBNP 5467.0*   CBG:  Lab 08/13/12 2126 08/13/12 1704 08/13/12 1143 08/13/12 0748 08/12/12 2123  GLUCAP 137* 104* 187* 101* 162*    Recent Results (from the past 240 hour(s))  URINE CULTURE     Status: Normal   Collection Time   08/04/12 11:15 AM      Component Value Range Status Comment   Specimen Description URINE, CATHETERIZED   Final    Special Requests NONE   Final    Culture  Setup Time 08/04/2012 18:59   Final    Colony Count >=100,000 COLONIES/ML   Final    Culture ESCHERICHIA COLI   Final    Report Status 08/06/2012 FINAL   Final    Organism ID, Bacteria ESCHERICHIA COLI   Final   CULTURE, BLOOD (ROUTINE X 2)     Status: Normal   Collection Time   08/04/12 11:30 AM      Component Value Range Status Comment   Specimen Description BLOOD LEFT HAND   Final    Special Requests BOTTLES DRAWN AEROBIC AND ANAEROBIC 10CC   Final    Culture  Setup Time 08/04/2012 18:44   Final    Culture NO GROWTH 5 DAYS   Final    Report Status 08/10/2012 FINAL   Final   CULTURE, BLOOD (ROUTINE X 2)     Status: Normal   Collection Time   08/04/12  1:15 PM      Component Value Range Status Comment   Specimen Description BLOOD LEFT HAND   Final    Special Requests BOTTLES DRAWN AEROBIC AND ANAEROBIC [facility]   Final    Culture  Setup Time 08/04/2012 18:44   Final    Culture NO GROWTH 5 DAYS   Final    Report Status 08/10/2012 FINAL   Final   MRSA PCR SCREENING     Status: Normal   Collection Time    08/08/12 12:53 AM      Component Value Range Status Comment   MRSA by PCR NEGATIVE  NEGATIVE Final   CLOSTRIDIUM DIFFICILE BY PCR     Status: Normal   Collection Time   08/10/12  9:28 AM      Component Value Range Status Comment   C difficile by pcr NEGATIVE  NEGATIVE Final  Studies: No results found.  Scheduled Meds:   . albuterol  2.5 mg Nebulization QID  . carvedilol  12.5 mg Oral BID WC  . cholestyramine light  4 g Oral Q12H  . enoxaparin (LOVENOX) injection  40 mg Subcutaneous Q24H  . feeding supplement  1 Container Oral TID BM  . insulin aspart  0-15 Units Subcutaneous TID WC  . saccharomyces boulardii  500 mg Oral BID  . sodium chloride  3 mL Intravenous Q12H   Continuous Infusions:   . sodium chloride 0.45 % 1,000 mL with potassium chloride 20 mEq infusion 50 mL/hr at 08/14/12 0549    Principal Problem:  *UTI (lower urinary tract infection) Active Problems:  DM  ANXIETY DISORDER, GENERALIZED  Hypothermia  Dementia  Altered mental state  Thrombocytopenia  Hypokalemia  Hypoxia  Increased oropharyngeal secretions  Failure to thrive in adult  Rash of perineum    Time spent: 30 minutes    [facility] M  [facility]  If 8PM-8AM, please contact night-coverage at [email or web address], password [facility] 08/14/2012, 7:29 AM  LOS: 10 days

## 2012-08-15 DIAGNOSIS — IMO0001 Reserved for inherently not codable concepts without codable children: Secondary | ICD-10-CM | POA: Diagnosis not present

## 2012-08-15 DIAGNOSIS — R4182 Altered mental status, unspecified: Secondary | ICD-10-CM | POA: Diagnosis not present

## 2012-08-15 DIAGNOSIS — T68XXXA Hypothermia, initial encounter: Secondary | ICD-10-CM | POA: Diagnosis not present

## 2012-08-15 DIAGNOSIS — J9600 Acute respiratory failure, unspecified whether with hypoxia or hypercapnia: Secondary | ICD-10-CM | POA: Diagnosis not present

## 2012-08-15 DIAGNOSIS — K5730 Diverticulosis of large intestine without perforation or abscess without bleeding: Secondary | ICD-10-CM | POA: Diagnosis not present

## 2012-08-15 DIAGNOSIS — R1310 Dysphagia, unspecified: Secondary | ICD-10-CM | POA: Diagnosis not present

## 2012-08-15 DIAGNOSIS — F411 Generalized anxiety disorder: Secondary | ICD-10-CM | POA: Diagnosis not present

## 2012-08-15 DIAGNOSIS — R627 Adult failure to thrive: Secondary | ICD-10-CM | POA: Diagnosis not present

## 2012-08-15 DIAGNOSIS — E876 Hypokalemia: Secondary | ICD-10-CM | POA: Diagnosis not present

## 2012-08-15 DIAGNOSIS — A498 Other bacterial infections of unspecified site: Secondary | ICD-10-CM | POA: Diagnosis not present

## 2012-08-15 DIAGNOSIS — R0989 Other specified symptoms and signs involving the circulatory and respiratory systems: Secondary | ICD-10-CM | POA: Diagnosis not present

## 2012-08-15 DIAGNOSIS — L02419 Cutaneous abscess of limb, unspecified: Secondary | ICD-10-CM | POA: Diagnosis not present

## 2012-08-15 DIAGNOSIS — I509 Heart failure, unspecified: Secondary | ICD-10-CM | POA: Diagnosis not present

## 2012-08-15 DIAGNOSIS — R609 Edema, unspecified: Secondary | ICD-10-CM | POA: Diagnosis not present

## 2012-08-15 DIAGNOSIS — I2510 Atherosclerotic heart disease of native coronary artery without angina pectoris: Secondary | ICD-10-CM | POA: Diagnosis not present

## 2012-08-15 DIAGNOSIS — F0390 Unspecified dementia without behavioral disturbance: Secondary | ICD-10-CM | POA: Diagnosis not present

## 2012-08-15 DIAGNOSIS — R197 Diarrhea, unspecified: Secondary | ICD-10-CM | POA: Diagnosis not present

## 2012-08-15 DIAGNOSIS — R0902 Hypoxemia: Secondary | ICD-10-CM | POA: Diagnosis not present

## 2012-08-15 DIAGNOSIS — I872 Venous insufficiency (chronic) (peripheral): Secondary | ICD-10-CM | POA: Diagnosis not present

## 2012-08-15 DIAGNOSIS — I10 Essential (primary) hypertension: Secondary | ICD-10-CM | POA: Diagnosis not present

## 2012-08-15 DIAGNOSIS — Z5189 Encounter for other specified aftercare: Secondary | ICD-10-CM | POA: Diagnosis not present

## 2012-08-15 DIAGNOSIS — D696 Thrombocytopenia, unspecified: Secondary | ICD-10-CM | POA: Diagnosis not present

## 2012-08-15 DIAGNOSIS — E119 Type 2 diabetes mellitus without complications: Secondary | ICD-10-CM | POA: Diagnosis not present

## 2012-08-15 DIAGNOSIS — R0609 Other forms of dyspnea: Secondary | ICD-10-CM | POA: Diagnosis not present

## 2012-08-15 DIAGNOSIS — N390 Urinary tract infection, site not specified: Secondary | ICD-10-CM | POA: Diagnosis not present

## 2012-08-15 LAB — BASIC METABOLIC PANEL
BUN: 14 mg/dL (ref 6–23)
CO2: 23 mEq/L (ref 19–32)
Calcium: 8.7 mg/dL (ref 8.4–10.5)
Chloride: 107 mEq/L (ref 96–112)
Creatinine, Ser: 0.77 mg/dL (ref 0.50–1.10)
GFR calc Af Amer: 82 mL/min — ABNORMAL LOW (ref >90)
GFR calc non Af Amer: 71 mL/min — ABNORMAL LOW (ref >90)
Glucose, Bld: 129 mg/dL — ABNORMAL HIGH (ref 70–99)
Potassium: 4.8 mEq/L (ref 3.5–5.1)
Sodium: 141 mEq/L (ref 135–145)

## 2012-08-15 LAB — CBC
MCHC: 33 g/dL (ref 30.0–36.0)
RDW: 17.6 % — ABNORMAL HIGH (ref 11.5–15.5)

## 2012-08-15 LAB — GLUCOSE, CAPILLARY
Glucose-Capillary: 124 mg/dL — ABNORMAL HIGH (ref 70–99)
Glucose-Capillary: 153 mg/dL — ABNORMAL HIGH (ref 70–99)

## 2012-08-15 MED ORDER — ENSURE PUDDING PO PUDG: 1.0000 | Freq: Three times a day (TID) | ORAL | Status: DC

## 2012-08-15 MED ORDER — ACETAMINOPHEN 325 MG PO TABS: 650.0000 mg | ORAL_TABLET | Freq: Four times a day (QID) | ORAL | Status: AC | PRN

## 2012-08-15 MED ORDER — STARCH (THICKENING) PO POWD: 1.0000 g | ORAL | Status: DC | PRN

## 2012-08-15 MED ORDER — SACCHAROMYCES BOULARDII 250 MG PO CAPS: 500.0000 mg | ORAL_CAPSULE | Freq: Two times a day (BID) | ORAL | Status: DC

## 2012-08-15 MED ORDER — LOPERAMIDE HCL 2 MG PO CAPS: 2.0000 mg | ORAL_CAPSULE | Freq: Two times a day (BID) | ORAL | Status: DC

## 2012-08-15 MED ORDER — LOPERAMIDE HCL 2 MG PO CAPS: 2.0000 mg | ORAL_CAPSULE | Freq: Two times a day (BID) | ORAL | Status: AC

## 2012-08-15 MED ORDER — ALPRAZOLAM 0.5 MG PO TABS: ORAL_TABLET | ORAL | Status: DC

## 2012-08-15 MED ORDER — CHOLESTYRAMINE LIGHT 4 G PO PACK: 4.0000 g | PACK | Freq: Every day | ORAL | Status: DC

## 2012-08-15 MED ORDER — ALPRAZOLAM 0.5 MG PO TABS: 0.2500 mg | ORAL_TABLET | Freq: Three times a day (TID) | ORAL | Status: DC | PRN

## 2012-08-15 NOTE — Discharge Summary (Signed)
Physician Discharge Summary  Audrey Reed JYN:[id number] DOB: Feb 23, 1918 DOA: 08/04/2012  PCP: Michele Mcalpine, MD  Admit date: 08/04/2012 Discharge date: 08/15/2012  Time spent: 40 minutes  Recommendations for Outpatient Follow-up:  1. Follow up with PCP 1 week  Discharge Diagnoses:  Principal Problem:  *UTI (lower urinary tract infection) Active Problems:  DM  ANXIETY DISORDER, GENERALIZED  Hypothermia  Dementia  Altered mental state  Thrombocytopenia  Hypokalemia  Hypoxia  Increased oropharyngeal secretions  Failure to thrive in adult  Rash of perineum  Diarrhea   Discharge Condition: stable  Diet recommendation: dysphagia 1  Filed Weights   08/14/12 0500 08/14/12 0620 08/15/12 0457  Weight: 75.1 kg (165 lb 9.1 oz) 74.5 kg (164 lb 3.9 oz) 74.2 kg (163 lb 9.3 oz)    History of present illness:    79 yo woman who lives home alone and does her own ADL. Family noticed that her gait was becoming more unsteady since before christmas. sHe told family that she took one of her xanax and they attributed it to this. At christmas dinner, she was confused and took some medications at the dinner table and became even more confused. Her grandson stayed with her that night and noticed a very strong odor to her urine. On 1/29 about 9, her son went to check on her and found her on the floor inside of her house. She was brought to the ER and was found to have a CBG of 106 and temperature of 89.  She was placed on a bear hugger. BP when was 100/60s. Patient was still very altered- speech low and slurred.  Admitted to SDU for overnight observation     Hospital Course:  Diarrhea  -Likely Antibiotic related -stoppped all antibiotics-she has had a total of >7 days of antibiotics  - Stool C. difficile PCR negative 08/10/12 and 08/14/12  - On a probiotic and imodium will continue immodium for 2 weeks at discharge per GI  -- trial of questran started 08/13/12. Continue for 2 more days after  discharge  -GI evaluated and opined likely antibiotic related as well. Made immodium scheduled vs prn.  -at discharge 1 soft stool over last 24 hours  UTI  -pan sensitive E Coli on urine culture  - augmentin discontinued. Pt s/p 9 days of antibiotics currently severe diarrhea.  - pt afebrile, non-toxic appearing at discharge. No dysuria Acute Resp Failure  -2/2 aspiration PNA  -sats >96% on room air at discharge  -Antibioitcs discontinued due to severe diarrhea  -pt afebrile. non-toxic appearing at discharge  -resolved 08/12/12 Active Problems:  Aspiration PNA  - Augmentin discontinued 08/11/12 due to diarrhea  -continue on dysphagia 1 diet-family accepting aspiration risks  Hypernatremia  -2/2 dehydration-from poor oral intake  -resolved at discharge -- recommend  bmet 1 week.  Skin Rash/perineum  -related to frequent watery stool  -treating diarrhea  -topical barriers to area  -keep clean and dry  Hypokalemia  - WNL at discharge  AMS  -2/2 toxic metabolic encephalopathy-from PNA/UTI/Metabolic derangements  -probable underlying dementia  -resolved at discharge ? Seizures  -MRI Brain neg likely related above -appreciate Neuro input  -no further seizure activity  DM  -CBG's stable range 106-162  -resume metformin at discharg  HTN  -controlled with Coreg- -initially lasix and ACE held will resume at discharge. Monitor BP daily for 2 weeks -SBP range 130-161 at discharge  Thrombocytopenia  -probably from sepesis from UTI/PNA  -resolved at discharge  Failure to Thrive Synd  -  poor overall prognosis  -d/w Son-Jerry Lamoreaux on the phone(cell- [phone])-poor prognosis explained-code status discussed-he agrees to DNR  - Appreciate palliative care input      Procedures:  none  Consultations:  GI  Palliative care  Discharge Exam: Filed Vitals:   08/15/12 0457 08/15/12 0521 08/15/12 0834 08/15/12 0911  BP: 163/99 146/82 166/86   Pulse: 97  99   Temp: 98 F (36.7 C)      TempSrc: Oral     Resp: 20     Height:      Weight: 74.2 kg (163 lb 9.3 oz)     SpO2: 96%   95%    General: awake alert NAD Cardiovascular: RRR No MGR trace LEE Respiratory: normal effort BSCTAB   Discharge Instructions  Discharge Orders    Future Orders Please Complete By Expires   Diet - low sodium heart healthy      Increase activity slowly          Medication List     As of 08/15/2012 11:15 AM    TAKE these medications         acetaminophen 325 MG tablet   Commonly known as: TYLENOL   Take 2 tablets (650 mg total) by mouth every 6 (six) hours as needed (or Fever >/= 101).      alendronate 70 MG tablet   Commonly known as: FOSAMAX   Take 70 mg by mouth every 7 (seven) days. Take with a full glass of water on an empty stomach.      ALPRAZolam 0.5 MG tablet   Commonly known as: XANAX   Take 1/2 to 1 tablet by mouth three times daily as needed for nerves      ALPRAZolam 0.5 MG tablet   Commonly known as: XANAX   Take 0.5-1 tablets (0.25-0.5 mg total) by mouth 3 (three) times daily as needed. for nerves      CALCIUM 600 600 MG Tabs   Generic drug: calcium carbonate   Take 600 mg by mouth 2 (two) times daily with a meal.      carvedilol 12.5 MG tablet   Commonly known as: COREG   Take 12.5 mg by mouth 2 (two) times daily with a meal.      CENTRUM SILVER ADULT 50+ PO   Take 1 tablet by mouth daily.      cholestyramine light 4 G packet   Commonly known as: PREVALITE   Take 1 packet (4 g total) by mouth daily.      feeding supplement Pudg   Take 1 Container by mouth 3 (three) times daily between meals.      food thickener Powd   Commonly known as: THICK IT   Take 1 g by mouth as needed.      furosemide 40 MG tablet   Commonly known as: LASIX   Take 40 mg by mouth daily.      lisinopril 40 MG tablet   Commonly known as: PRINIVIL,ZESTRIL   Take 1 tablet (40 mg total) by mouth daily.      loperamide 2 MG capsule   Commonly known as: IMODIUM   Take  1 capsule (2 mg total) by mouth 2 (two) times daily.      metFORMIN 500 MG tablet   Commonly known as: GLUCOPHAGE   Take 500 mg by mouth daily with breakfast.      minoxidil 10 MG tablet   Commonly known as: LONITEN   Take 10 mg by mouth  daily.      saccharomyces boulardii 250 MG capsule   Commonly known as: FLORASTOR   Take 2 capsules (500 mg total) by mouth 2 (two) times daily.      simvastatin 20 MG tablet   Commonly known as: ZOCOR   Take 20 mg by mouth at bedtime.      Vitamin D3 1000 UNITS Caps   Take 2 capsules by mouth daily.           Follow-up Information    Follow up with NADEL,SCOTT M, MD. In 1 week.   Contact information:   [facility] [address] [place] [address] [phone]           The results of significant diagnostics from this hospitalization (including imaging, microbiology, ancillary and laboratory) are listed below for reference.    Significant Diagnostic Studies: Ct Head Wo Contrast  08/04/2012  *RADIOLOGY REPORT*  Clinical Data:  The patient was found on the floor  CT HEAD WITHOUT CONTRAST CT CERVICAL SPINE WITHOUT CONTRAST  Technique:  Multidetector CT imaging of the head and cervical spine was performed following the standard protocol without intravenous contrast.  Multiplanar CT image reconstructions of the cervical spine were also generated.  Comparison:  08/04/2012  CT HEAD  Findings: There is diffuse patchy low density throughout the subcortical and periventricular white matter consistent with chronic small vessel ischemic change.  There is prominence of the sulci and ventricles consistent with brain atrophy.  There is no evidence for acute brain infarct, hemorrhage or mass.  Retention cyst versus polyp noted in the right maxillary sinus. The remaining paranasal sinuses are clear.  The mastoid air cells are clear the skull appears intact.  IMPRESSION:  1.  Small vessel ischemic change and brain atrophy. 2.  No acute  intracranial abnormalities.  CT CERVICAL SPINE  Findings: Normal alignment of the cervical spine.  The vertebral body heights and disc spaces are well preserved.  Bones are osteopenic.  There is no fracture or subluxation identified. Scarring noted within the anterior right apex.  Carotid artery calcifications are noted bilaterally.  IMPRESSION:  1.  No acute findings. 2.  Carotid artery calcifications.   Original Report Authenticated By: Signa Kell, M.D.    Ct Cervical Spine Wo Contrast  08/04/2012  *RADIOLOGY REPORT*  Clinical Data:  The patient was found on the floor  CT HEAD WITHOUT CONTRAST CT CERVICAL SPINE WITHOUT CONTRAST  Technique:  Multidetector CT imaging of the head and cervical spine was performed following the standard protocol without intravenous contrast.  Multiplanar CT image reconstructions of the cervical spine were also generated.  Comparison:  08/04/2012  CT HEAD  Findings: There is diffuse patchy low density throughout the subcortical and periventricular white matter consistent with chronic small vessel ischemic change.  There is prominence of the sulci and ventricles consistent with brain atrophy.  There is no evidence for acute brain infarct, hemorrhage or mass.  Retention cyst versus polyp noted in the right maxillary sinus. The remaining paranasal sinuses are clear.  The mastoid air cells are clear the skull appears intact.  IMPRESSION:  1.  Small vessel ischemic change and brain atrophy. 2.  No acute intracranial abnormalities.  CT CERVICAL SPINE  Findings: Normal alignment of the cervical spine.  The vertebral body heights and disc spaces are well preserved.  Bones are osteopenic.  There is no fracture or subluxation identified. Scarring noted within the anterior right apex.  Carotid artery calcifications are  noted bilaterally.  IMPRESSION:  1.  No acute findings. 2.  Carotid artery calcifications.   Original Report Authenticated By: Signa Kell, M.D.    Mr Brain Wo  Contrast  08/05/2012  *RADIOLOGY REPORT*  Clinical Data: Unsteady gait.  Garbled speech.  Right-sided facial droop.  Diabetic.  MRI HEAD WITHOUT CONTRAST  Technique:  Multiplanar, multiecho pulse sequences of the brain and surrounding structures were obtained according to standard protocol without intravenous contrast.  Comparison: 08/04/2012 CT.  No comparison MR.  Findings: No acute infarct.  No intracranial hemorrhage.  Moderate to marked small vessel disease type changes.  Global atrophy without hydrocephalus.  No intracranial mass lesion detected on this unenhanced exam.  Major intracranial vascular structures are patent.  IMPRESSION: No acute infarct.  Moderate to marked small vessel disease type changes.   Original Report Authenticated By: Lacy Duverney, M.D.    Dg Chest Port 1 View  08/11/2012  *RADIOLOGY REPORT*  Clinical Data: Pneumonia  PORTABLE CHEST - 1 VIEW  Comparison: 08/09/2012  Findings: Right upper lobe airspace opacity and bibasilar opacities have improved.  Right lower lobe curvilinear atelectasis or possibly subpulmonic fluid noted.  Heart size is upper limits of normal.  IMPRESSION: Improvement in multilobar airspace consolidation, likely improving pneumonia.  Radiographic resolution may take 4-6 weeks and complete resolution may not be visible before that time.   Original Report Authenticated By: Christiana Pellant, M.D.    Dg Chest Port 1 View  08/09/2012  *RADIOLOGY REPORT*  Clinical Data: Shortness of breath, weakness, confusion  PORTABLE CHEST - 1 VIEW  Comparison: 08/07/2012; 07/27/2012; 08/04/2012  Findings: Grossly unchanged cardiac silhouette and mediastinal contours with atherosclerotic calcifications within the thoracic aorta.  Minimal improved aeration of the bilateral lower lungs with persistent ill-defined heterogeneous opacities in the right mid/upper lung and left lower lung. No new focal airspace opacities. Query trace bilateral pleural effusions.  No pneumothorax.  Unchanged  bones.  IMPRESSION: 1.  Grossly unchanged findings of multifocal infection. 2.  Minimally improved right basilar atelectasis.   Original Report Authenticated By: Tacey Ruiz, MD    Dg Chest Port 1 View  08/08/2012  *RADIOLOGY REPORT*  Clinical Data: Worsening O2 saturation.  PORTABLE CHEST - 1 VIEW  Comparison: Chest radiograph performed 08/06/2012  Findings: Since the recent prior study, there has been interval development of right upper and lower lung zone airspace opacification, with persistent left basilar airspace opacity, compatible with worsening multifocal pneumonia.  A small left pleural effusion is again seen.  No pneumothorax is identified.  The cardiomediastinal silhouette is borderline normal in size. Calcification is noted in the aortic arch.  No acute osseous abnormalities are identified.  IMPRESSION: Significantly worsening bilateral pneumonia noted; small left pleural effusion seen.  These results were called by telephone on 08/08/2012 at 12:03 a.m. to [facility] on [phone], who verbally acknowledged these results.   Original Report Authenticated By: Tonia Ghent, M.D.    Dg Chest Port 1 View  08/06/2012  *RADIOLOGY REPORT*  Clinical Data: Shortness of breath, cough.  PORTABLE CHEST - 1 VIEW  Comparison: 08/04/2012  Findings: Bibasilar airspace opacities, likely atelectasis.  Small layering effusions bilaterally.  Heart is borderline in size. Diffuse peribronchial thickening.  Stable chronic interstitial prominence. No acute bony abnormality.  IMPRESSION: Stable chronic bronchitic changes and interstitial disease.  Bibasilar atelectasis with small bilateral effusions.   Original Report Authenticated By: Charlett Nose, M.D.    Dg Chest Port 1 View  08/04/2012  *RADIOLOGY REPORT*  Clinical Data:  Short of breath, chest pain  PORTABLE CHEST - 1 VIEW  Comparison: Chest radiograph 06/09/2004  Findings: Normal mediastinum and heart silhouette.  There is chronic bronchitic markings centrally.   There is a chronic interstitial pattern which is increased compared to prior.  No focal consolidation.  No pneumothorax.  IMPRESSION: Chronic interstitial pattern is increased compared to prior exam from 2005.   Original Report Authenticated By: Genevive Bi, M.D.    Dg Swallowing Func-speech Pathology  08/09/2012  Riley Nearing Deblois, CCC-SLP     08/09/2012  2:24 PM Objective Swallowing Evaluation: Modified Barium Swallowing Study   Patient Details  Name: Audrey Reed MRN: [id number] Date of Birth: 03-13-1918  Today's Date: 08/09/2012 Time: 1320-1350 SLP Time Calculation (min): 30 min  Past Medical History:  Past Medical History  Diagnosis Date  . HTN (hypertension)   . CAD (coronary artery disease)   . Venous insufficiency   . Hypercholesterolemia   . DM (diabetes mellitus)   . Diverticulosis of colon   . Colon polyp   . Adenocarcinoma of breast   . DJD (degenerative joint disease)   . Osteoporosis   . Anxiety disorder    Past Surgical History:  Past Surgical History  Procedure Date  . Cataract extraction   . Mastectomy     for breast cancer   HPI:  MOHOGANY TOPPINS is a 79 y.o. female who lives home alone and does  her own ADL. Family noticed that her gait was becoming more  unsteady since before christmas. sHe told family that she took  one of her xanax and they attributed it to this. At christmas  dinner, she was confused and took some medications at the dinner  table and became even more confused. Her grandson stayed with her  that night and noticed a very strong odor to her urine. This AM  about 9, her son went to check on her and found her on the floor  inside of her house. She was brought to the ER and was found to  have a CBG of 106 and temperature of 89. She was placed on a bear  hugger. Pt initally started on soft diet with thin liquids but  has had apparent aspriation events, MBS to objectively assess  swallow function.      Assessment / Plan / Recommendation Clinical Impression  Dysphagia Diagnosis:  Moderate pharyngeal phase  dysphagia;Suspected primary esophageal dysphagia Clinical impression: Pt presents with a moderate pharyngeal  dysphagia and the appearance of a severe esophageal dysphagia. Pt  with discoordinated oral propulsion of boluses to the pharyngeal  cavity with a delay in swallow initiation. Base of tongue  retraction, laryngeal elevation , epiglottic deflection and  closure of the laryngeal vestibule are weak. Thin and nectar  thick liquids immediately penetrate to the vocal cords and spill  to the airway once pt abducts vocal folds. Aspiration is  intermittently silent. When pt does cough, severe esophageal  residuals backflow to the pyriform sinuses with probable  aspiration events.   With therapeutic intervention, the pt consumed nectar thick  liquids via teaspoon with a chin tuck, facilitating airway  protection before the swallow and decreasing vallecular  residuals. The pt was not able to coordinate chin tuck with cup  sips despite max cues. Recommend Dys 1 (puree) diet with nectar  thick liquids via teaspoon with a chin tuck and double swallows.  Questionable if pt will be able to complete strategies with full  supervision. Family has verbalized no  alternate nutrition. May  want to consider comfort feeds depending on pts tolerance of this  diet.   The pt will continue to be at high aspiration risk with all PO  due to appearance of severe esophageal dysphagia. Suspect that  recent aspriation events likely due to aspiration of esophageal  stasis rather than pharyngeal dysphagia.  Would pt benefit from  esoohageal assessment/GI referral given her history of stricture?      Treatment Recommendation  Therapy as outlined in treatment plan below    Diet Recommendation Dysphagia 1 (Puree);Nectar-thick liquid   Liquid Administration via: Spoon Medication Administration: Crushed with puree Supervision: Staff feed patient;Full supervision/cueing for  compensatory strategies Compensations: Slow  rate;Small sips/bites;Multiple dry swallows  after each bite/sip;Follow solids with liquid Postural Changes and/or Swallow Maneuvers: Chin tuck;Out of bed  for meals;Upright 30-60 min after meal    Other  Recommendations Recommended Consults: Consider esophageal  assessment;Consider GI evaluation Oral Care Recommendations: Oral care QID Other Recommendations: Order thickener from pharmacy   Follow Up Recommendations  Skilled Nursing facility    Frequency and Duration min 2x/week  2 weeks   Pertinent Vitals/Pain NA    SLP Swallow Goals Patient will utilize recommended strategies during swallow to  increase swallowing safety with: Moderate cueing   General HPI: RUKAYA KLEINSCHMIDT is a 79 y.o. female who lives home  alone and does her own ADL. Family noticed that her gait was  becoming more unsteady since before christmas. sHe told family  that she took one of her xanax and they attributed it to this. At  christmas dinner, she was confused and took some medications at  the dinner table and became even more confused. Her grandson  stayed with her that night and noticed a very strong odor to her  urine. This AM about 9, her son went to check on her and found  her on the floor inside of her house. She was brought to the ER  and was found to have a CBG of 106 and temperature of 89. She was  placed on a bear hugger. Pt initally started on soft diet with  thin liquids but has had apparent aspriation events, MBS to  objectively assess swallow function.  Type of Study: Modified Barium Swallowing Study Reason for Referral: Objectively evaluate swallowing function Diet Prior to this Study: NPO Temperature Spikes Noted: No Respiratory Status: Supplemental O2 delivered via (comment) History of Recent Intubation: No Behavior/Cognition: Alert;Confused Oral Cavity - Dentition: Edentulous Oral Motor / Sensory Function: Within functional limits Self-Feeding Abilities: Able to feed self Patient Positioning: Upright in chair Baseline Vocal  Quality: Clear Volitional Cough: Congested Volitional Swallow: Able to elicit Anatomy: Within functional limits Pharyngeal Secretions: Not observed secondary MBS    Reason for Referral Objectively evaluate swallowing function   Oral Phase Oral Preparation/Oral Phase Oral Phase: Impaired Oral - Honey Oral - Honey Teaspoon: Reduced posterior  propulsion;Lingual/palatal residue Oral - Nectar Oral - Nectar Teaspoon: Reduced posterior  propulsion;Lingual/palatal residue Oral - Nectar Cup: Reduced posterior propulsion;Lingual/palatal  residue Oral - Thin Oral - Thin Cup: Reduced posterior propulsion;Lingual/palatal  residue Oral - Solids Oral - Puree: Reduced posterior propulsion;Lingual/palatal  residue;Delayed oral transit Oral - Mechanical Soft: Reduced posterior  propulsion;Lingual/palatal residue;Delayed oral transit   Pharyngeal Phase Pharyngeal Phase Pharyngeal Phase: Impaired Pharyngeal - Honey Pharyngeal - Honey Teaspoon: Premature spillage to  valleculae;Delayed swallow initiation;Reduced epiglottic  inversion;Reduced laryngeal elevation;Reduced airway/laryngeal  closure;Reduced tongue base retraction;Pharyngeal residue -  valleculae (Chin tuck improves airway closure,  reduces residue. ) Pharyngeal - Nectar Pharyngeal - Nectar Teaspoon: Premature spillage to  valleculae;Delayed swallow initiation;Reduced epiglottic  inversion;Reduced laryngeal elevation;Reduced airway/laryngeal  closure;Reduced tongue base retraction;Pharyngeal residue -  valleculae (Chin tuck improves airway closure, reduces residue. ) Pharyngeal - Nectar Cup: Premature spillage to valleculae;Delayed  swallow initiation;Reduced epiglottic inversion;Reduced laryngeal  elevation;Reduced airway/laryngeal closure;Reduced tongue base  retraction;Pharyngeal residue - valleculae;Penetration/Aspiration  during swallow;Penetration/Aspiration after swallow;Moderate  aspiration;Premature spillage to pyriform sinuses (pt could  coordinate sip and chin  tuck) Penetration/Aspiration details (nectar cup): Material enters  airway, passes BELOW cords without attempt by patient to eject  out (silent aspiration);Material enters airway, CONTACTS cords  then ejected out;Material enters airway, CONTACTS cords and not  ejected out Pharyngeal - Thin Pharyngeal - Thin Cup: Premature spillage to valleculae;Delayed  swallow initiation;Reduced epiglottic inversion;Reduced laryngeal  elevation;Reduced airway/laryngeal closure;Reduced tongue base  retraction;Pharyngeal residue - valleculae Pharyngeal - Solids Pharyngeal - Puree: Premature spillage to valleculae;Delayed  swallow initiation;Reduced epiglottic inversion;Reduced laryngeal  elevation;Reduced airway/laryngeal closure;Reduced tongue base  retraction;Pharyngeal residue - valleculae (Chin tuck improves  airway closure, reduces residue. ) Pharyngeal - Mechanical Soft: Premature spillage to  valleculae;Delayed swallow initiation;Reduced epiglottic  inversion;Reduced laryngeal elevation;Reduced airway/laryngeal  closure;Reduced tongue base retraction;Pharyngeal residue -  valleculae (Chin tuck improves airway closure, reduces residue. )   Cervical Esophageal Phase    GO    Cervical Esophageal Phase Cervical Esophageal Phase: Impaired Cervical Esophageal Phase - Comment Cervical Esophageal Comment: Pt observed to have appearance of  severe stasis throughout esophagus after only minimal trials of  thin liquids. With cough, stasis surged to pharynx and penetrated  the airway.         Harlon Ditty, MA CCC-SLP [phone]  DeBlois, Riley Nearing 08/09/2012, 2:21 PM      Microbiology: Recent Results (from the past 240 hour(s))  MRSA PCR SCREENING     Status: Normal   Collection Time   08/08/12 12:53 AM      Component Value Range Status Comment   MRSA by PCR NEGATIVE  NEGATIVE Final   CLOSTRIDIUM DIFFICILE BY PCR     Status: Normal   Collection Time   08/10/12  9:28 AM      Component Value Range Status Comment   C difficile  by pcr NEGATIVE  NEGATIVE Final   CLOSTRIDIUM DIFFICILE BY PCR     Status: Normal   Collection Time   08/13/12  4:50 PM      Component Value Range Status Comment   C difficile by pcr NEGATIVE  NEGATIVE Final      Labs: Basic Metabolic Panel:  Lab 08/15/12 4540 08/14/12 0715 08/12/12 0723 08/11/12 1018 08/10/12 0745  NA 141 142 146* 144 145  K 4.8 4.5 3.6 3.3* 3.4*  CL 107 108 113* 108 107  CO2 23 23 26 26 26   GLUCOSE 129* 135* 99 201* 155*  BUN 14 15 17 22  24*  CREATININE 0.77 0.84 0.90 1.16* 1.14*  CALCIUM 8.7 8.5 8.1* 8.0* 8.5  MG -- -- -- -- --  PHOS -- -- -- -- --   Liver Function Tests: No results found for this basename: AST:5,ALT:5,ALKPHOS:5,BILITOT:5,PROT:5,ALBUMIN:5 in the last 168 hours No results found for this basename: LIPASE:5,AMYLASE:5 in the last 168 hours No results found for this basename: AMMONIA:5 in the last 168 hours CBC:  Lab 08/15/12 0630 08/14/12 0715 08/11/12 1018 08/10/12 0745 08/09/12 0510  WBC 12.2* 13.2* 11.8* 12.0* 14.4*  NEUTROABS -- -- -- -- --  HGB 11.6* 11.7* 11.0* 11.1* 12.3  HCT 35.2* 36.3 33.9* 33.8* 37.0  MCV 86.7 88.8 87.6 86.9 86.9  PLT 343 269 126* 88* 81*   Cardiac Enzymes: No results found for this basename: CKTOTAL:5,CKMB:5,CKMBINDEX:5,TROPONINI:5 in the last 168 hours BNP: BNP (last 3 results)  Basename 08/08/12 0845  PROBNP 5467.0*   CBG:  Lab 08/15/12 0753 08/14/12 2111 08/14/12 1741 08/14/12 1213 08/14/12 0954  GLUCAP 124* 184* 152* 152* 147*       Signed:  Carols Clemence M  [facility] 08/15/2012, 11:15 AM

## 2012-08-15 NOTE — Progress Notes (Signed)
Pt prepared for d/c to SNF. IV d/c'd. Skin intact except as most recently charted. Vitals are stable. Report called to receiving facility. Pt to be transported by ambulance service. 

## 2012-08-15 NOTE — Progress Notes (Signed)
Physical Therapy Treatment Patient Details Name: Audrey Reed MRN: [id number] DOB: Aug 04, 1918 Today's Date: 08/15/2012 Time: 4098-1191 PT Time Calculation (min): 31 min  PT Assessment / Plan / Recommendation Comments on Treatment Session  Continues to improve with activity tolerance.  Still unsafe for unassisted walker ambulation.  Needs SNF level therapies to maximize independence.  Patient still has hopes to return to her home.    Follow Up Recommendations  SNF                 Equipment Recommendations  None recommended by PT        Frequency Min 2X/week   Plan Discharge plan remains appropriate    Precautions / Restrictions Precautions Precautions: Fall   Pertinent Vitals/Pain Denies pain    Mobility  Bed Mobility Details for Bed Mobility Assistance: Patient up in chair Transfers Sit to Stand: 3: Mod assist;With armrests;With upper extremity assist;From chair/3-in-1 (with cues for hand placement each time) Stand to Sit: 4: Min assist;3: Mod assist;With armrests;With upper extremity assist;To chair/3-in-1 (assist incr to low seat w/uncontrolled descent despite cues) Details for Transfer Assistance: performed multiple times to [facility] and to chair in hallway to rest Ambulation/Gait Ambulation/Gait Assistance: 3: Mod assist;4: Min assist Ambulation Distance (Feet): 100 Feet (x 2) Assistive device: Rolling walker Ambulation/Gait Assistance Details: forward flexed and with increased proximity to walker, increased assist on turns due to loss of balance  Gait Pattern: Step-through pattern;Decreased stride length;Trunk flexed          PT Goals Acute Rehab PT Goals Pt will go Sit to Stand: with supervision PT Goal: Sit to Stand - Progress: Progressing toward goal Pt will go Stand to Sit: with supervision PT Goal: Stand to Sit - Progress: Progressing toward goal Pt will Ambulate: 51 - 150 feet;with supervision;with least restrictive assistive device PT Goal: Ambulate -  Progress: Progressing toward goal  Visit Information  Last PT Received On: 08/15/12    Subjective Data  Subjective: Supposed to go to [facility] today.   Cognition  Overall Cognitive Status: Appears within functional limits for tasks assessed/performed Behavior During Session: [facility] for tasks performed Following Commands: Follows one step commands consistently    Balance  Static Standing Balance Static Standing - Balance Support: Bilateral upper extremity supported;During functional activity (holding walker while PT assist with hygiene) Static Standing - Level of Assistance: 5: Stand by assistance  End of Session PT - End of Session Equipment Utilized During Treatment: Gait belt Activity Tolerance: Patient tolerated treatment well Patient left: in chair   GP     [facility] 08/15/2012, 1:11 PM [place], PT [phone] 08/15/2012

## 2012-08-15 NOTE — Progress Notes (Signed)
Patient for d/c today to SNF bed at [facility]. Patient and family  agreeable to this plan- plan transfer via EMS. Reece Levy, MSW, Theresia Majors [phone]

## 2012-08-15 NOTE — Progress Notes (Signed)
[facility] Progress Note    Since last GI note: Diarrhea improving on twice daily imodium.  Eating fairly well  Objective: Vital signs in last 24 hours: Temp:  [97.8 F (36.6 C)-98 F (36.7 C)] 98 F (36.7 C) (01/09 0457) Pulse Rate:  [73-99] 99  (01/09 0834) Resp:  [18-20] 20  (01/09 0457) BP: (143-166)/(80-99) 166/86 mmHg (01/09 0834) SpO2:  [95 %-98 %] 95 % (01/09 0911) Weight:  [163 lb 9.3 oz (74.2 kg)] 163 lb 9.3 oz (74.2 kg) (01/09 0457) Last BM Date: 08/14/12 General: somewhat demented Heart: regular rate and rythm Abdomen: soft, non-tender, non-distended, normal bowel sounds   Lab Results:  Basename 08/15/12 0630 08/14/12 0715  WBC 12.2* 13.2*  HGB 11.6* 11.7*  PLT 343 269  MCV 86.7 88.8    Basename 08/15/12 0630 08/14/12 0715  NA 141 142  K 4.8 4.5  CL 107 108  CO2 23 23  GLUCOSE 129* 135*  BUN 14 15  CREATININE 0.77 0.84  CALCIUM 8.7 8.5     Medications: Scheduled Meds:   . albuterol  2.5 mg Nebulization QID  . carvedilol  12.5 mg Oral BID WC  . cholestyramine light  4 g Oral Daily  . enoxaparin (LOVENOX) injection  40 mg Subcutaneous Q24H  . feeding supplement  1 Container Oral TID BM  . insulin aspart  0-15 Units Subcutaneous TID WC  . loperamide  2 mg Oral BID  . saccharomyces boulardii  500 mg Oral BID  . sodium chloride  3 mL Intravenous Q12H   Continuous Infusions:   . sodium chloride 0.45 % 1,000 mL with potassium chloride 20 mEq infusion 50 mL/hr at 08/14/12 0549   PRN Meds:.acetaminophen, acetaminophen, albuterol, food thickener, ondansetron (ZOFRAN) IV, ondansetron    Assessment/Plan: 79 y.o. female with c. Diff neg diarrhea  Improving on twice daily scheduled imodium, she should continue this for next 1-2 weeks and decrease as needed for constipation.  I suspect the diarhea was related to her illness, abx.     Rob Bunting, MD  08/15/2012, 10:53 AM [facility] Pager [phone]

## 2012-08-15 NOTE — Discharge Summary (Signed)
Addendum  Patient seen and examined, chart and data base reviewed.  I agree with the above assessment and the discharge plan.  For full details please see Mrs. Toya Smothers NP note.  Pansensitive Escherichia coli UTI, acute respiratory failure/aspiration pneumonia.  Acute issues resolved. Diarrhea, appreciate GI help. This is slowed down too.  Clint Lipps Pager: [phone] 08/15/2012, 1:31 PM

## 2012-08-20 ENCOUNTER — Telehealth: Payer: Self-pay | Admitting: Internal Medicine

## 2012-08-20 NOTE — Telephone Encounter (Signed)
Spoke with Morrie Sheldon at [facility]. Scheduled patient on 09/04/12 at 1:45/2:00 PM with Dr. Juanda Chance. Morrie Sheldon is aware that patient must have health care power of attorney with her for visit. She will check with her son and verify he can keep this appointment.

## 2012-08-22 ENCOUNTER — Encounter: Payer: Self-pay | Admitting: Internal Medicine

## 2012-08-22 ENCOUNTER — Encounter: Payer: Self-pay | Admitting: *Deleted

## 2012-08-22 DIAGNOSIS — R627 Adult failure to thrive: Secondary | ICD-10-CM | POA: Diagnosis not present

## 2012-08-22 DIAGNOSIS — R1310 Dysphagia, unspecified: Secondary | ICD-10-CM | POA: Diagnosis not present

## 2012-08-22 DIAGNOSIS — F411 Generalized anxiety disorder: Secondary | ICD-10-CM | POA: Diagnosis not present

## 2012-08-23 NOTE — Consult Note (Signed)
I have reviewed and discussed the care of this patient in detail with the nurse practitioner including pertinent patient records, physical exam findings and data. I agree with details of this encounter.  

## 2012-08-27 ENCOUNTER — Ambulatory Visit: Payer: Medicare Other | Admitting: Pulmonary Disease

## 2012-09-04 ENCOUNTER — Ambulatory Visit: Payer: Medicare Other | Admitting: Internal Medicine

## 2012-09-05 DIAGNOSIS — R609 Edema, unspecified: Secondary | ICD-10-CM | POA: Diagnosis not present

## 2012-09-05 DIAGNOSIS — R1310 Dysphagia, unspecified: Secondary | ICD-10-CM | POA: Diagnosis not present

## 2012-09-07 ENCOUNTER — Other Ambulatory Visit: Payer: Self-pay | Admitting: Pulmonary Disease

## 2012-09-07 DIAGNOSIS — R609 Edema, unspecified: Secondary | ICD-10-CM | POA: Diagnosis not present

## 2012-09-07 DIAGNOSIS — F411 Generalized anxiety disorder: Secondary | ICD-10-CM | POA: Diagnosis not present

## 2012-09-07 DIAGNOSIS — E876 Hypokalemia: Secondary | ICD-10-CM | POA: Diagnosis not present

## 2012-09-07 DIAGNOSIS — I872 Venous insufficiency (chronic) (peripheral): Secondary | ICD-10-CM | POA: Diagnosis not present

## 2012-09-07 DIAGNOSIS — D696 Thrombocytopenia, unspecified: Secondary | ICD-10-CM | POA: Diagnosis not present

## 2012-09-07 DIAGNOSIS — K5730 Diverticulosis of large intestine without perforation or abscess without bleeding: Secondary | ICD-10-CM | POA: Diagnosis not present

## 2012-09-07 DIAGNOSIS — L02419 Cutaneous abscess of limb, unspecified: Secondary | ICD-10-CM | POA: Diagnosis not present

## 2012-09-07 DIAGNOSIS — I509 Heart failure, unspecified: Secondary | ICD-10-CM | POA: Diagnosis not present

## 2012-09-07 DIAGNOSIS — I2510 Atherosclerotic heart disease of native coronary artery without angina pectoris: Secondary | ICD-10-CM | POA: Diagnosis not present

## 2012-09-07 DIAGNOSIS — I10 Essential (primary) hypertension: Secondary | ICD-10-CM | POA: Diagnosis not present

## 2012-09-07 DIAGNOSIS — Z5189 Encounter for other specified aftercare: Secondary | ICD-10-CM | POA: Diagnosis not present

## 2012-09-07 DIAGNOSIS — F0390 Unspecified dementia without behavioral disturbance: Secondary | ICD-10-CM | POA: Diagnosis not present

## 2012-09-07 DIAGNOSIS — R4182 Altered mental status, unspecified: Secondary | ICD-10-CM | POA: Diagnosis not present

## 2012-09-07 DIAGNOSIS — E119 Type 2 diabetes mellitus without complications: Secondary | ICD-10-CM | POA: Diagnosis not present

## 2012-09-07 DIAGNOSIS — A498 Other bacterial infections of unspecified site: Secondary | ICD-10-CM | POA: Diagnosis not present

## 2012-09-07 DIAGNOSIS — N390 Urinary tract infection, site not specified: Secondary | ICD-10-CM | POA: Diagnosis not present

## 2012-09-07 DIAGNOSIS — T68XXXA Hypothermia, initial encounter: Secondary | ICD-10-CM | POA: Diagnosis not present

## 2012-09-07 DIAGNOSIS — R0902 Hypoxemia: Secondary | ICD-10-CM | POA: Diagnosis not present

## 2012-09-09 ENCOUNTER — Telehealth: Payer: Self-pay | Admitting: Pulmonary Disease

## 2012-09-09 DIAGNOSIS — I872 Venous insufficiency (chronic) (peripheral): Secondary | ICD-10-CM | POA: Diagnosis not present

## 2012-09-09 DIAGNOSIS — R609 Edema, unspecified: Secondary | ICD-10-CM | POA: Diagnosis not present

## 2012-09-09 NOTE — Telephone Encounter (Signed)
Leigh could you work on this for pt

## 2012-09-10 NOTE — Telephone Encounter (Signed)
Called and spoke with Audrey Reed and she explained that the pt was in the hospital and now they have placed her in [facility] and the pt does not like the doctor that is seeing her there and requested to come back and see SN.   The family is wanting the pt to be d/c from [facility] but Audrey Reed stated that they do not seem interested in d.c the pt home.  She stated that they will set up home care for the pt but they are not sure how to go about getting the pt out of [facility].  They are aware of appt with SN tomorrow at 11;30.

## 2012-09-11 ENCOUNTER — Ambulatory Visit (INDEPENDENT_AMBULATORY_CARE_PROVIDER_SITE_OTHER): Payer: Medicare Other | Admitting: Pulmonary Disease

## 2012-09-11 ENCOUNTER — Encounter: Payer: Self-pay | Admitting: Pulmonary Disease

## 2012-09-11 ENCOUNTER — Other Ambulatory Visit (INDEPENDENT_AMBULATORY_CARE_PROVIDER_SITE_OTHER): Payer: Medicare Other

## 2012-09-11 VITALS — BP 110/60 | HR 70 | Temp 98.0°F | Ht 61.0 in | Wt 165.2 lb

## 2012-09-11 DIAGNOSIS — M810 Age-related osteoporosis without current pathological fracture: Secondary | ICD-10-CM

## 2012-09-11 DIAGNOSIS — E7800 Pure hypercholesterolemia, unspecified: Secondary | ICD-10-CM

## 2012-09-11 DIAGNOSIS — R0600 Dyspnea, unspecified: Secondary | ICD-10-CM

## 2012-09-11 DIAGNOSIS — I10 Essential (primary) hypertension: Secondary | ICD-10-CM

## 2012-09-11 DIAGNOSIS — I2510 Atherosclerotic heart disease of native coronary artery without angina pectoris: Secondary | ICD-10-CM | POA: Diagnosis not present

## 2012-09-11 DIAGNOSIS — R627 Adult failure to thrive: Secondary | ICD-10-CM

## 2012-09-11 DIAGNOSIS — M159 Polyosteoarthritis, unspecified: Secondary | ICD-10-CM

## 2012-09-11 DIAGNOSIS — R609 Edema, unspecified: Secondary | ICD-10-CM

## 2012-09-11 DIAGNOSIS — C50919 Malignant neoplasm of unspecified site of unspecified female breast: Secondary | ICD-10-CM

## 2012-09-11 DIAGNOSIS — I872 Venous insufficiency (chronic) (peripheral): Secondary | ICD-10-CM | POA: Diagnosis not present

## 2012-09-11 DIAGNOSIS — K5730 Diverticulosis of large intestine without perforation or abscess without bleeding: Secondary | ICD-10-CM

## 2012-09-11 DIAGNOSIS — E119 Type 2 diabetes mellitus without complications: Secondary | ICD-10-CM

## 2012-09-11 DIAGNOSIS — R0609 Other forms of dyspnea: Secondary | ICD-10-CM

## 2012-09-11 LAB — BASIC METABOLIC PANEL
BUN: 37 mg/dL — ABNORMAL HIGH (ref 6–23)
Calcium: 8.9 mg/dL (ref 8.4–10.5)
Creatinine, Ser: 1.8 mg/dL — ABNORMAL HIGH (ref 0.4–1.2)
GFR: 28.1 mL/min — ABNORMAL LOW (ref >60.00)
Glucose, Bld: 126 mg/dL — ABNORMAL HIGH (ref 70–99)
Potassium: 4.4 mEq/L (ref 3.5–5.1)

## 2012-09-11 LAB — BRAIN NATRIURETIC PEPTIDE: Pro B Natriuretic peptide (BNP): 145 pg/mL — ABNORMAL HIGH (ref 0.0–100.0)

## 2012-09-11 NOTE — Progress Notes (Signed)
Subjective:    Patient ID: Audrey Reed, female    DOB: 03/15/1918, 79 y.o.   MRN: [id number]  HPI 79 y/o WF here for a follow up visit... she has mult med problems as noted below...   ~  October 17, 2010:  65mo ROV & improved overall> notes less left hip pain & uses the Mobic Prn...    Hx HBP/ CAD/ VI>  BP controlled on meds= 142/84 today & denies CP, palpit, SOB, edema, etc...    Chol>  stable on Simva20 & FLP shows TChol 167, TG 78, HDL 79, LDL 73... continue same.    DM>  regulated w/ Metform500/d & BS= 135, A1c wasn't done...    Hx Breast Cancer>  off Femara, ?released by Audrey Reed, Mammogram 10/11 was OK w/ f/u 26yr & monthly self exam recommended...     DJD/ Osteop>  Mobic helps, had BMD at [facility] 10/11 showing TScores -0.9 in Spine (16% improved), & -1.8 in left FemNeck (6% worse);  on Fosamax + calcium/ mvi/ vit d...  ~  April 19, 2011:  65mo ROV & she reports some left side pain w/ radiation to her leg; she was eval by LMD in [place] w/ muscle spasm & inflamm but no better after Pred dosepak; therefore went to Chiropractor & she reports relief w/ his adjustments (Audrey Reed in [place])...  Hx HBP controlled on Labet, Lisin, Lasix; BP= 130/72 today & feeling well w/o HA, CP, palpit, SOB, edema;  Known CAD w/o angina & she takes ASA & has been walking for exercise... Chol looks good on Sima20, and DM control ok on Metformin (see prob list below)>  ~  October 17, 2011:  65mo ROV & Audrey Reed is remarkably stable at 67 and has no new complaints or concerns> BP is fairly well controlled on regimen & she is reminded to take then regularly every day;  Lipids have been at goal on Simva20 + diet;  DM control similarly adeq on Metformin monotherapy;  She continues to see her chiropractor regularly & takes Fosamax for her osteroporosis;  She is not fasting today & she requested to wait until her next f/u visit to repeat fasting blood work...  ~  April 22, 2012:  65mo ROV & Audrey Reed feels well- no new  complaints or concerns, but BP noted to be elev at 180/82 today, not checking BP at home, & claims she is taking her regimen regularly> Coreg12.5Bid, Labetolol200Bid, Lisinopril20, Lasix40> we decided to incr the Lisinopril to 40mg /d & plan short term f/u for further adjustments... She has a wound on left lat leg w/ escar & we discussed dressing changes & local care... Chol & DM are well regulated on meds below...    We reviewed prob list, meds, xrays and labs> see below >> OK Flu vaccine today... EKG 9/13 showed SBrady, rate47, RBBB, NSSTTWA... LABS 9/13:  FLP- at goals on Simva20;  Chems- ok on Metform500 w/ BS=149 A1c=5.9 Creat=1.2;  CBC- wnl;  TSH=1.54...  ~  June 03, 2012:  53mo ROV & follow up BP after adjustment w/ incr Lisin20=>40/d + continue other 3 meds the same;  She brought all med bottles today but there is no Labetolol & call [facility] confirms not on this med but she is taking Coreg; BP today= 150/84 so we decided to try Minoxidil 10mg /d...  She will continue to monitor BP at home 7 f/u w/e in 2 months...    We reviewed prob list, meds,  xrays and labs> see below for updates >>   ~  September 11, 2012:  35mo ROV & post hosp check> Hosp [facility]/29 - 08/15/12  W/ UTI, HBP, DM, Anxiety & FTT; she developed diarrhea on the antibiotics, CDiff neg; also had episode of asp pneum- prolonged her hosp stay & resolved w/ conservative rx; due to FTT- she agreed to palliative care consult & DNR written; she was sent to [facility] for rehab & is still there>> c/o some weeping from her legs, gas pains, wants to be disch from [facility]...    HBP, CAD, Ven Insuffic> on Coreg12.5Bid, Lisin40, Minoxidil10, Lasix40Bid; BP=110/60 & she denies CP/angina, palpit, ch in SOB/DOE; notes some edema & some "weeping"; Labs showed Creat up to 1.8 7 BNP improved to 145, therefore rec to decr Lasix40Qam...    Chol> on Simva20; last FLP was 9/13 w/ TChol 178, TG 71, HDL 80, LDL 84    DM> on  [id number]; Labs showed BS=125, A1c=6.0; good control, keep same...    GI> Divertics, hx polyp> followed by Audrey Reed for GI; currently taking Florastar, Immodium, Cholestyramine, Ensure pudding; diarrhea from recent hosp is improved...    Hx breast cancer> part mastectomy 2005 by Audrey Reed; followed by Audrey Reed on Femara for 4-69yrs; last seen 6/11- doing well & f/u prn...    DJD, Osteopenia> on Fosamax, Calcium, MVI, VitD, Tylenol; last BMD was 10/11 at [facility] w/ TScore -1.8 in Fem Neck; continue same meds...    Debilitated & FTT> she was disch from [place] to [facility] for rehab...    Anxiety> off prev alprazolam, she is coping well she says... We reviewed prob list, meds, xrays and labs> see below for updates >> had Flu vaccine 9/13... LABS 2/14:  Chems- ok x BS=125 A1c=6.0 Creat=1.8 BNP=145          Problem List:  HYPERTENSION (ICD-401.9) - controlled on COREG 12.5Bid + LISINOPRIL incr to 40, LASIX 40mg /d, off LABETOLOL...  ~  9/12:  BP= 130/72 & similar at home> feeling well & denies HA, fatigue, visual changes, CP, palipit, dizziness, syncope, dyspnea, etc... ~  3/13:  BP= 158/92 & she is reminded to take meds every day; she remains asymptomatic w/o CP, palpit, SOB, edema, etc... ~  9/13:  BP= 180/82 & she is asymptomatic; we decided to incr the LISINOPRIL to 40mg /d... ~  [facility] 12/29 - 08/15/12  W/ UTI, HBP, DM, Anxiety & FTT; she developed diarrhea on the antibiotics- CDiff neg; also had episode of asp pneum- prolonged her hosp stay & resolved w/ conservative rx; due to FTT- she agreed to palliative care consult & DNR written; she was sent to [facility] for rehab & is still there>> c/o some weeping from her legs, gas pains, wants to be disch from [facility]... ~  2/14:  on Coreg12.5Bid, Lisin40, Minoxidil10, Lasix40Bid; BP=110/60 & she denies CP/angina, palpit, ch in SOB/DOE; notes some edema & some "weeping"; Labs showed Creat up to 1.8 7 BNP improved to 145, therefore rec to decr  Lasix40Qam...  CORONARY ARTERY DISEASE (ICD-414.00) - on ASA 81mg /d... no CP, palpit, SOB, etc... exerc= yard, walking... ~  cath 2/91 by [facility] showed 70% single vessel dis RCA & MVP... ~  In [place] 1/14> she developed transient AFib=> converted spont;  EKG w/ RBBB...  VENOUS INSUFFICIENCY (ICD-459.81) - she has mod Ven Insuffic w/ chr edema & dry skin dermatitis> knows to follow a low sodium diet, elevate legs, wear support hose when able "[name]", & takes  the LASIX daily.  HYPERCHOLESTEROLEMIA (ICD-272.0) - on SIMVASTATIN 20mg /d... tolerating well, +diet... ~  FLP 5/08 showed TChol 162, TG 82, HDL 63, LDL 83... ~  FLP 3/09 showed TChol 179, TG 67, HDL 73, LDL 93... rec- same med. ~  FLP 3/10 showed TChol 163, TG 71, HDL 72, LDL 77 ~  FLP 2/11 showed TChol 182, TG 91, HDL 88, LDL 76... Continue same. ~  FLP 3/12 showed TChol 167, TG 78, HDL 79, LDL 73 ~  3/13: she is not fasting for labs today & declines ret for FLP, prefers to wait til ROV in 50mo... ~  FLP 9/13 on Simva20 showed TChol 178, TG 71, HDL 80, LDL 84  DM (ICD-250.00) - on diet + METFORMIN 500mg /d added 3/09... ~  labs 5/08 showed BS=166, HgA1c=6.5 ~  labs 3/09 showed BS= 161 & Metformin 500mg /d started... ~  lab 9/09 showed BS= 111, HgA1c= 6.1.Marland KitchenMarland Kitchen rec- continue same. ~  labs 3/10 showed BS= 149, A1c= 6.2 ~  labs 9/10 showed BS= 112, A1c= 5.8 ~  labs 2/11 showed BS= 115, A1c= 6.4 ~  Labs 3/12 showed BS= 135, A1c= not done... ~  3/13: she is not fasting for labs today & declines ret for FLP, prefers to wait til ROV in 50mo... ~  Labs 9/13 on Metform500 showed BS=149, A1c=5.9 ~  2/14: on Metform500Qam; Labs showed BS=125, A1c=6.0; good control, keep same...  DIVERTICULOSIS OF COLON (ICD-562.10) - last colonoscopy was 11/04 by Audrey Reed showing divertics & polyp (not retrieved)... she denies abd pain, D/ C/ blood etc...  Hx of ADENOCARCINOMA, BREAST (ICD-174.9) - Dx'd 11/05 w/ part mastectomy for papillary carcinoma  left breast... surg by [facility], followed by Audrey Reed and [facility] stopped 4/10 after 4.33yrs Rx... ~  saw Audrey Reed 10/09- doing well, labs OK... f/u Mammogram was neg,  f/u BMD on Femara showed osteopenia w/ TScores -1.4 to -2.2 (sl better in Spine, sl worse in hip)- continue present meds. ~  saw Audrey Reed 12/09- doing well... ~  saw [facility] 4/10 & Femara stopped... ~  f/u Mammogram 10/10 at [facility]= neg...  ~  saw Audrey Reed 6/11- doing well, no changes made, f/u Prn. ~  f/u Mammogram 10/11 at [facility] showed benign findings, no worrisome abn, f/u 39yr. ~  f/u Mammogram 10/12 at [facility] is similar in appearance, stable, f/u 9yr...  DEGENERATIVE JOINT DISEASE, GENERALIZED (ICD-715.00) OSTEOPOROSIS (ICD-733.00) - she has osteopenia on [facility]- on FOSAMAX 70mg /wk, +Caltrate, +Vits w/ VitD. ~  BMD 5/07 at [facility] w/ TScores -1.5 to -2.3.Marland KitchenMarland KitchenMarland Kitchen this was improved from 12/05 values... ~  labs 9/09 showed Vit D leve3l = 32... rec- OTC Vit D supplement. ~  BMD 10/09 at [facility] showed TScores -1.4 to -2.2 (sl better in Spine, sl worse in hip)- continue present meds. ~  f/u BMD due 10/11> done at [facility] w/ TScores -0.9 in Spine (16% improved), & -1.8 in left FemNeck (6% worse); continue Alendronate. ~  Labs 3/12 showed Vit D level = 39... rec OTC Vit D supplement ~2000u daily...  ANXIETY DISORDER, GENERALIZED (ICD-300.02) - on ALPRAZOLAM 0.25mg  Prn... her daughter's husb has Melanoma & it is very stressful to the family.  Health Maintenance - she gets the yearly FLU vaccine;  has PNEUMOVAX 9/09;  ?last Tetanus vaccine...   Past Surgical History  Procedure Date  . Cataract extraction   . Mastectomy     for breast cancer    Outpatient Encounter Prescriptions as of 09/11/2012  Medication Sig Dispense Refill  . acetaminophen (  TYLENOL) 325 MG tablet Take 2 tablets (650 mg total) by mouth every 6 (six) hours as needed (or Fever >/= 101).      Marland Kitchen alendronate (FOSAMAX) 70 MG tablet Take 70 mg  by mouth every 7 (seven) days. Take with a full glass of water on an empty stomach.      . calcium carbonate (CALCIUM 600) 600 MG TABS Take 600 mg by mouth 2 (two) times daily with a meal.      . carvedilol (COREG) 12.5 MG tablet Take 12.5 mg by mouth 2 (two) times daily with a meal.      . Cholecalciferol (VITAMIN D3) 1000 UNITS CAPS Take 2 capsules by mouth daily.      . cholestyramine light (PREVALITE) 4 G packet Take 1 packet (4 g total) by mouth daily.      . feeding supplement (ENSURE) PUDG Take 1 Container by mouth 3 (three) times daily between meals.      . furosemide (LASIX) 40 MG tablet Take 40 mg by mouth daily.      Marland Kitchen lisinopril (PRINIVIL,ZESTRIL) 40 MG tablet Take 1 tablet (40 mg total) by mouth daily.  90 tablet  3  . loperamide (IMODIUM) 2 MG capsule Take 2 mg by mouth 2 (two) times daily.      . metFORMIN (GLUCOPHAGE) 500 MG tablet TAKE 1 TABLET EVERY MORNING  90 tablet  2  . minoxidil (LONITEN) 10 MG tablet Take 10 mg by mouth daily.      . Multiple Vitamins-Minerals (CENTRUM SILVER ADULT 50+ PO) Take 1 tablet by mouth daily.      Marland Kitchen saccharomyces boulardii (FLORASTOR) 250 MG capsule Take 2 capsules (500 mg total) by mouth 2 (two) times daily.      . simvastatin (ZOCOR) 20 MG tablet Take 20 mg by mouth at bedtime.      . ALPRAZolam (XANAX) 0.5 MG tablet Take 1/2 to 1 tablet by mouth three times daily as needed for nerves  90 tablet  2  . ALPRAZolam (XANAX) 0.5 MG tablet Take 0.5-1 tablets (0.25-0.5 mg total) by mouth 3 (three) times daily as needed. for nerves  30 tablet  0  . [DISCONTINUED] food thickener (THICK IT) POWD Take 1 g by mouth as needed.        Allergies  Allergen Reactions  . Nitrofurantoin     REACTION: unsure of reaction--happened long ago  . Tramadol Nausea Only  . Codeine Nausea And Vomiting, Swelling and Rash    Current Medications, Allergies, Past Medical History, Past Surgical History, Family History, and Social History were reviewed in [employer] record.    Review of Systems        See HPI - all other systems neg except as noted...  The patient complains of dyspnea on exertion and difficulty walking.  The patient denies anorexia, fever, weight loss, weight gain, vision loss, decreased hearing, hoarseness, chest pain, syncope, peripheral edema, prolonged cough, headaches, hemoptysis, abdominal pain, melena, hematochezia, severe indigestion/heartburn, hematuria, incontinence, muscle weakness, suspicious skin lesions, transient blindness, depression, unusual weight change, abnormal bleeding, enlarged lymph nodes, and angioedema.     Objective:   Physical Exam    WD, WN, 79 y/o WF in NAD... GENERAL:  Alert & oriented; pleasant & cooperative... HEENT:  [place]/AT, EOM-wnl, PERRLA, EACs-clear, TMs-wnl, NOSE-clear, THROAT-clear & wnl. NECK:  Supple w/ fairROM; no JVD; normal carotid impulses w/o bruits; no thyromegaly or nodules palpated; no lymphadenopathy. CHEST:  Clear to P & A;  without wheezes/ rales/ or rhonchi heard... HEART:  Regular Rhythm; without murmurs/ rubs/ or gallops detected... ABDOMEN:  Soft & nontender; mild panniculus; normal bowel sounds; no organomegaly or masses palpated... EXT: without deformities or arthritic changes; no varicose veins/ +venous insuffic/ 1+ edema. NEURO:  CN's intact;  no focal neuro deficits... DERM:  Dry skin dermatitis on LEs...  RADIOLOGY DATA:  Reviewed in the EPIC EMR & discussed w/ the patient...  LABORATORY DATA:  Reviewed in the EPIC EMR & discussed w/ the patient...   Assessment & Plan:    HBP>  On 4 med regimen adjusted during her 1/14 Hosp; BP is good & she is feeling better...  CAD>  On ASA daily, asymptomatic & encouraged to exercise regularly w/ her rehab program...  Ven Insuffic>  She has VI, chr edema & dry skin dermatitis, continue same RX...  CHOL>  On Simva20 + diet;  Continue same...  DM>  On Metformin 500mg /d + diet;  Continue same...  GI> hx  Divertics & Polyp>  She is improving w/ resolution of diarrhea- rec to wean Probiotic & Immodium...  Hx Breast Cancer>  Doing satis w/o recurrent disease...  DJD, Osteoporosis>  Doing satis on the Fosamax etc...  Anxiety>  Off Alprazolam now & hasn't needed...   Patient's Medications  New Prescriptions   No medications on file  Previous Medications   ACETAMINOPHEN (TYLENOL) 325 MG TABLET    Take 2 tablets (650 mg total) by mouth every 6 (six) hours as needed (or Fever >/= 101).   ALENDRONATE (FOSAMAX) 70 MG TABLET    Take 70 mg by mouth every 7 (seven) days. Take with a full glass of water on an empty stomach.   CALCIUM CARBONATE (CALCIUM 600) 600 MG TABS    Take 600 mg by mouth 2 (two) times daily with a meal.   CARVEDILOL (COREG) 12.5 MG TABLET    Take 12.5 mg by mouth 2 (two) times daily with a meal.   CHOLECALCIFEROL (VITAMIN D3) 1000 UNITS CAPS    Take 2 capsules by mouth daily.   CHOLESTYRAMINE LIGHT (PREVALITE) 4 G PACKET    Take 1 packet (4 g total) by mouth daily.   FEEDING SUPPLEMENT (ENSURE) PUDG    Take 1 Container by mouth 3 (three) times daily between meals.   FUROSEMIDE (LASIX) 40 MG TABLET    Take 40 mg by mouth daily.   LISINOPRIL (PRINIVIL,ZESTRIL) 40 MG TABLET    Take 1 tablet (40 mg total) by mouth daily.   LOPERAMIDE (IMODIUM) 2 MG CAPSULE    Take 2 mg by mouth 2 (two) times daily. As needed for diarrhea   METFORMIN (GLUCOPHAGE) 500 MG TABLET    TAKE 1 TABLET EVERY MORNING   MINOXIDIL (LONITEN) 10 MG TABLET    Take 10 mg by mouth daily.   MULTIPLE VITAMINS-MINERALS (CENTRUM SILVER ADULT 50+ PO)    Take 1 tablet by mouth daily.   SIMVASTATIN (ZOCOR) 20 MG TABLET    Take 20 mg by mouth at bedtime.  Modified Medications   Modified Medication Previous Medication   SACCHAROMYCES BOULARDII (FLORASTOR) 250 MG CAPSULE saccharomyces boulardii (FLORASTOR) 250 MG capsule      Take 250 mg by mouth daily.    Take 2 capsules (500 mg total) by mouth 2 (two) times daily.   Discontinued Medications   ALPRAZOLAM (XANAX) 0.5 MG TABLET    Take 1/2 to 1 tablet by mouth three times daily as needed for nerves   ALPRAZOLAM Prudy Feeler)  0.5 MG TABLET    Take 0.5-1 tablets (0.25-0.5 mg total) by mouth 3 (three) times daily as needed. for nerves   FOOD THICKENER (THICK IT) POWD    Take 1 g by mouth as needed.

## 2012-09-11 NOTE — Patient Instructions (Addendum)
Today we updated your med list in our EPIC system...    Continue your current medications the same...  We made adjustments in some of your meds & wrote for te changes on the [facility] order sheet...    Please give this to the nurses at the [facility]...  Today we did some follow up Lab work...    We will call you w/ the results & any additional med changes...  Call for any questions...  Let's plan a follow up visit in 1 month.Marland KitchenMarland Kitchen

## 2012-09-17 DIAGNOSIS — L03119 Cellulitis of unspecified part of limb: Secondary | ICD-10-CM | POA: Diagnosis not present

## 2012-09-17 DIAGNOSIS — I509 Heart failure, unspecified: Secondary | ICD-10-CM | POA: Diagnosis not present

## 2012-09-24 DIAGNOSIS — R627 Adult failure to thrive: Secondary | ICD-10-CM | POA: Diagnosis not present

## 2012-09-24 DIAGNOSIS — F0390 Unspecified dementia without behavioral disturbance: Secondary | ICD-10-CM | POA: Diagnosis not present

## 2012-09-24 DIAGNOSIS — E119 Type 2 diabetes mellitus without complications: Secondary | ICD-10-CM | POA: Diagnosis not present

## 2012-09-24 DIAGNOSIS — I10 Essential (primary) hypertension: Secondary | ICD-10-CM | POA: Diagnosis not present

## 2012-09-27 ENCOUNTER — Telehealth: Payer: Self-pay | Admitting: Pulmonary Disease

## 2012-09-27 DIAGNOSIS — E119 Type 2 diabetes mellitus without complications: Secondary | ICD-10-CM | POA: Diagnosis not present

## 2012-09-27 DIAGNOSIS — I10 Essential (primary) hypertension: Secondary | ICD-10-CM | POA: Diagnosis not present

## 2012-09-27 MED ORDER — SILVER SULFADIAZINE 1 % EX CREA: TOPICAL_CREAM | CUTANEOUS | Status: DC

## 2012-09-27 NOTE — Telephone Encounter (Signed)
I spoke with nurse. She stated pt right leg is weeping. Her weight is good. She is keeping wrapped and wiping with gauzes. She is taking lasix 40 mg every morning. Per pt her leg has done this in the past.  Any further recs. Please advise SN thanks  Last OV 09/11/12 Pending 10/01/12  Allergies  Allergen Reactions  . Nitrofurantoin     REACTION: unsure of reaction--happened long ago  . Tramadol Nausea Only  . Codeine Nausea And Vomiting, Swelling and Rash

## 2012-09-27 NOTE — Telephone Encounter (Signed)
Per SN---  1.  Clean leg twice daily with mild soapy water and pat dry 2.  Cover weeping areas with silvadene cream ( call this in) 3.  No salt in her diet 4.  Ok to increase lasix 40 mg  Daily---2 daily in the am if swelling does not go down overnight.

## 2012-09-27 NOTE — Telephone Encounter (Signed)
Spoke with nurse and notified of recs per SN She states that she will call and inform pt's son of recs as well Rx was sent to pharm for silvadene cream

## 2012-09-29 DIAGNOSIS — I10 Essential (primary) hypertension: Secondary | ICD-10-CM | POA: Diagnosis not present

## 2012-09-29 DIAGNOSIS — E119 Type 2 diabetes mellitus without complications: Secondary | ICD-10-CM | POA: Diagnosis not present

## 2012-09-30 DIAGNOSIS — I10 Essential (primary) hypertension: Secondary | ICD-10-CM | POA: Diagnosis not present

## 2012-09-30 DIAGNOSIS — E119 Type 2 diabetes mellitus without complications: Secondary | ICD-10-CM | POA: Diagnosis not present

## 2012-10-01 ENCOUNTER — Other Ambulatory Visit (INDEPENDENT_AMBULATORY_CARE_PROVIDER_SITE_OTHER): Payer: Medicare Other

## 2012-10-01 ENCOUNTER — Encounter: Payer: Self-pay | Admitting: Pulmonary Disease

## 2012-10-01 ENCOUNTER — Ambulatory Visit (INDEPENDENT_AMBULATORY_CARE_PROVIDER_SITE_OTHER): Payer: Medicare Other | Admitting: Pulmonary Disease

## 2012-10-01 VITALS — BP 152/84 | HR 72 | Temp 97.3°F | Ht 61.0 in | Wt 152.8 lb

## 2012-10-01 DIAGNOSIS — R609 Edema, unspecified: Secondary | ICD-10-CM

## 2012-10-01 DIAGNOSIS — M810 Age-related osteoporosis without current pathological fracture: Secondary | ICD-10-CM

## 2012-10-01 DIAGNOSIS — I872 Venous insufficiency (chronic) (peripheral): Secondary | ICD-10-CM | POA: Diagnosis not present

## 2012-10-01 DIAGNOSIS — I10 Essential (primary) hypertension: Secondary | ICD-10-CM | POA: Diagnosis not present

## 2012-10-01 DIAGNOSIS — I2510 Atherosclerotic heart disease of native coronary artery without angina pectoris: Secondary | ICD-10-CM

## 2012-10-01 DIAGNOSIS — M159 Polyosteoarthritis, unspecified: Secondary | ICD-10-CM

## 2012-10-01 DIAGNOSIS — K5730 Diverticulosis of large intestine without perforation or abscess without bleeding: Secondary | ICD-10-CM

## 2012-10-01 DIAGNOSIS — E7800 Pure hypercholesterolemia, unspecified: Secondary | ICD-10-CM

## 2012-10-01 DIAGNOSIS — R627 Adult failure to thrive: Secondary | ICD-10-CM

## 2012-10-01 DIAGNOSIS — C50919 Malignant neoplasm of unspecified site of unspecified female breast: Secondary | ICD-10-CM

## 2012-10-01 DIAGNOSIS — D126 Benign neoplasm of colon, unspecified: Secondary | ICD-10-CM

## 2012-10-01 DIAGNOSIS — E119 Type 2 diabetes mellitus without complications: Secondary | ICD-10-CM

## 2012-10-01 LAB — CBC WITH DIFFERENTIAL/PLATELET
Basophils Absolute: 0.1 10*3/uL (ref 0.0–0.1)
HCT: 39 % (ref 36.0–46.0)
Lymphs Abs: 3.6 10*3/uL (ref 0.7–4.0)
Monocytes Absolute: 1.1 10*3/uL — ABNORMAL HIGH (ref 0.1–1.0)
Monocytes Relative: 10.1 % (ref 3.0–12.0)
Neutrophils Relative %: 54.7 % (ref 43.0–77.0)
Platelets: 253 10*3/uL (ref 150.0–400.0)
RDW: 15.8 % — ABNORMAL HIGH (ref 11.5–14.6)

## 2012-10-01 LAB — BASIC METABOLIC PANEL
BUN: 24 mg/dL — ABNORMAL HIGH (ref 6–23)
Creatinine, Ser: 1.3 mg/dL — ABNORMAL HIGH (ref 0.4–1.2)
GFR: 42.14 mL/min — ABNORMAL LOW (ref >60.00)
Glucose, Bld: 131 mg/dL — ABNORMAL HIGH (ref 70–99)
Potassium: 5.5 mEq/L — ABNORMAL HIGH (ref 3.5–5.1)

## 2012-10-01 NOTE — Patient Instructions (Addendum)
Today we updated your med list in our EPIC system...    Continue your current medications the same...  Today we rechecked your blood work...    We will call you w/ the results and any adjustment in your meds...  Recommendation for Podiatrist>    DrZeigler [phone]    Micheline Maze al [phone]  Be sure to keep the salt out of your diet, & drink the nutritional supplements between meals...  Call for any questions...  Let's plan a follow up visit in 6 weeks.Marland KitchenMarland Kitchen

## 2012-10-01 NOTE — Progress Notes (Signed)
Subjective:    Patient ID: Audrey Reed, female    DOB: 12-09-1917, 79 y.o.   MRN: [id number]  HPI 79 y/o WF here for a follow up visit... she has mult med problems as noted below...   ~  October 17, 2011:  230mo ROV & Audrey Reed is remarkably stable at 55 and has no new complaints or concerns> BP is fairly well controlled on regimen & she is reminded to take then regularly every day;  Lipids have been at goal on Simva20 + diet;  DM control similarly adeq on Metformin monotherapy;  She continues to see her chiropractor regularly & takes Fosamax for her osteroporosis;  She is not fasting today & she requested to wait until her next f/u visit to repeat fasting blood work...  ~  April 22, 2012:  230mo ROV & Audrey Reed feels well- no new complaints or concerns, but BP noted to be elev at 180/82 today, not checking BP at home, & claims she is taking her regimen regularly> Coreg12.5Bid, Labetolol200Bid, Lisinopril20, Lasix40> we decided to incr the Lisinopril to 40mg /d & plan short term f/u for further adjustments... She has a wound on left lat leg w/ escar & we discussed dressing changes & local care... Chol & DM are well regulated on meds below...    We reviewed prob list, meds, xrays and labs> see below >> OK Flu vaccine today... EKG 9/13 showed SBrady, rate47, RBBB, NSSTTWA... LABS 9/13:  FLP- at goals on Simva20;  Chems- ok on Metform500 w/ BS=149 A1c=5.9 Creat=1.2;  CBC- wnl;  TSH=1.54...  ~  June 03, 2012:  55mo ROV & follow up BP after adjustment w/ incr Lisin20=>40/d + continue other 3 meds the same;  She brought all med bottles today but there is no Labetolol & [facility] confirms not on this med but she is taking Coreg; BP today= 150/84 so we decided to try Minoxidil 10mg /d...  She will continue to monitor BP at home & f/u w/e in 2 months...    We reviewed prob list, meds, xrays and labs> see below for updates >>   ~  September 11, 2012:  79mo ROV & post hosp check> Hosp  by [facility] 12/29 - 08/15/12  W/ UTI, HBP, DM, Anxiety & FTT; she developed diarrhea on the antibiotics, CDiff neg; also had episode of asp pneum- prolonged her hosp stay & resolved w/ conservative rx; due to FTT- she agreed to palliative care consult & DNR written; she was sent to [facility] for rehab & is still there>> c/o some weeping from her legs, gas pains, wants to be disch from [facility]...    HBP, CAD, Ven Insuffic> on Coreg12.5Bid, Lisin40, Minoxidil10, Lasix40Bid; BP=110/60 & she denies CP/angina, palpit, ch in SOB/DOE; notes some edema & some "weeping"; Labs showed Creat up to 1.8 7 BNP improved to 145, therefore rec to decr Lasix40Qam...    Chol> on Simva20; last FLP was 9/13 w/ TChol 178, TG 71, HDL 80, LDL 84    DM> on Metform500Qam; Labs showed BS=125, A1c=6.0; good control, keep same...    GI> Divertics, hx polyp> followed by DrDBrodie for GI; currently taking Florastar, Immodium, Cholestyramine, Ensure pudding; diarrhea from recent hosp is improved...    Hx breast cancer> part mastectomy 2005 by DrIngram; followed by Duwayne Heck on Femara for 4-68yrs; last seen 6/11- doing well & f/u prn...    DJD, Osteopenia> on Fosamax, Calcium, MVI, VitD, Tylenol; last BMD was 10/11 at [facility] w/ TScore -  1.8 in Fem Neck; continue same meds...    Debilitated & FTT> she was disch from [place] to [place] for rehab...    Anxiety> off prev alprazolam, she is coping well she says... We reviewed prob list, meds, xrays and labs> see below for updates >> had Flu vaccine 9/13... LABS 2/14:  Chems- ok x BS=125 A1c=6.0 Creat=1.8 BNP=145   ~  October 01, 2012:  3wk ROV & Audrey Reed is now home from [employer] & getting home therapy etc; DrGates added Zaroxyln 2.5mg  on M&F & sent her home on this; recall that we decr her Lasix from 40Bid to just Qam last visit due to Creat up from 0.8 to 1.8 & BNP was down to 145; she has lost 12# over the last 3 wks- down to 153# now but still has 2-3+ edema in LEs; wound on right leg is improved, still  doing dressing changes and only min weeping reported,  BP= 152/84, chest is clear, and heart is regular...  We decided to recheck BMet 1st & decide on adjustments => K=5.5, BUN=24, Creat=1.3; therefore keep meds the same for now & ROV 476mo...          Problem List:  HYPERTENSION (ICD-401.9) - controlled on COREG 12.5Bid + LISINOPRIL40, LASIX 40mg /d, off LABETOLOL...  ~  9/12:  BP= 130/72 & similar at home> feeling well & denies HA, fatigue, visual changes, CP, palipit, dizziness, syncope, dyspnea, etc... ~  3/13:  BP= 158/92 & she is reminded to take meds every day; she remains asymptomatic w/o CP, palpit, SOB, edema, etc... ~  9/13:  BP= 180/82 & she is asymptomatic; we decided to incr the LISINOPRIL to 40mg /d... ~  Hosp by [facility] 12/29 - 08/15/12  W/ UTI, HBP, DM, Anxiety & FTT; she developed diarrhea on the antibiotics- CDiff neg; also had episode of asp pneum- prolonged her hosp stay & resolved w/ conservative rx; due to FTT- she agreed to palliative care consult & DNR written; she was sent to [facility] for rehab & is still there>> c/o some weeping from her legs, gas pains, wants to be disch from [facility]... ~  09/11/12: on Coreg12.5Bid, Lisin40, Minoxidil10, Lasix40Bid; BP=110/60 & she denies CP/angina, palpit, ch in SOB/DOE; notes some edema & some "weeping"; Labs showed Creat up to 1.8 & BNP improved to 145, therefore rec to decr Lasix40Qam... ~  10/01/12: on Coreg12.5Bid, Lisin40, Minox10; Lasix40, Zaroxyln2.5-M&F (added by [facility] in [place]), K10-MWF; BP=152/84 & she feels better home from [facility] x1wk; Labs showed K=5.5, BUN=24, Creat=1.3; therefore keep meds the same for now & ROV 476mo.  CORONARY ARTERY DISEASE (ICD-414.00) - on ASA 81mg /d... no CP, palpit, SOB, etc... exerc= yard, walking... ~  cath 2/91 by DrWeintraub showed 70% single vessel dis RCA & MVP... ~  In [place] 1/14> she developed transient AFib=> converted spont;  EKG w/ RBBB...  VENOUS INSUFFICIENCY (ICD-459.81) - she has mod Ven Insuffic w/  chr edema & dry skin dermatitis> knows to follow a low sodium diet, elevate legs, wear support hose when able "[name]", & takes the LASIX daily. ~  2/14:  Zaroxyln 2.5-MWF added by DrGates in [place]; f/u labs showed K=5.5, BUN=24, Creat=1.3; therefore keep meds the same for now & ROV 476mo  HYPERCHOLESTEROLEMIA (ICD-272.0) - on SIMVASTATIN 20mg /d... tolerating well, +diet... ~  FLP 5/08 showed TChol 162, TG 82, HDL 63, LDL 83... ~  FLP 3/09 showed TChol 179, TG 67, HDL 73, LDL 93... rec- same med. ~  FLP 3/10 showed TChol  163, TG 71, HDL 72, LDL 77 ~  FLP 2/11 showed TChol 182, TG 91, HDL 88, LDL 76... Continue same. ~  FLP 3/12 showed TChol 167, TG 78, HDL 79, LDL 73 ~  3/13: she is not fasting for labs today & declines ret for FLP, prefers to wait til ROV in 68mo... ~  FLP 9/13 on Simva20 showed TChol 178, TG 71, HDL 80, LDL 84  DM (ICD-250.00) - on diet + METFORMIN 500mg /d added 3/09... ~  labs 5/08 showed BS=166, HgA1c=6.5 ~  labs 3/09 showed BS= 161 & Metformin 500mg /d started... ~  lab 9/09 showed BS= 111, HgA1c= 6.1.Marland KitchenMarland Kitchen rec- continue same. ~  labs 3/10 showed BS= 149, A1c= 6.2 ~  labs 9/10 showed BS= 112, A1c= 5.8 ~  labs 2/11 showed BS= 115, A1c= 6.4 ~  Labs 3/12 showed BS= 135, A1c= not done... ~  3/13: she is not fasting for labs today & declines ret for FLP, prefers to wait til ROV in 68mo... ~  Labs 9/13 on Metform500 showed BS=149, A1c=5.9 ~  2/14: on Metform500Qam; Labs showed BS=125, A1c=6.0; good control, keep same...  DIVERTICULOSIS OF COLON (ICD-562.10) - last colonoscopy was 11/04 by DrDBrodie showing divertics & polyp (not retrieved)... she denies abd pain, D/ C/ blood etc...  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 surg by Lur[facility] H[facility], followed by Duwayne Heck and [facility] stopped 4/10 after 4.58yrs Rx... ~  saw Duwayne Heck 10/09- doing well, labs OK... f/u Mammogram was neg,  f/u BMD on Femara showed  osteopenia w/ TScores -1.4 to -2.2 (sl better in Spine, sl worse in hip)- continue present meds. ~  saw DrIngram 12/09- doing well... ~  saw DrLivesay 4/10 & Femara stopped... ~  f/u Mammogram 10/10 at [facility]= neg...  ~  saw Duwayne Heck 6/11- doing well, no changes made, f/u Prn. ~  f/u Mammogram 10/11 at [facility] showed benign findings, no worrisome abn, f/u 38yr. ~  f/u Mammogram 10/12 at [facility] is similar in appearance, stable, f/u 36yr...  DEGENERATIVE JOINT DISEASE, GENERALIZED (ICD-715.00) OSTEOPOROSIS (ICD-733.00) - she has osteopenia on scans from [facility]- on FOSAMAX 70mg /wk, +Caltrate, +Vits w/ VitD. ~  BMD 5/07 at [facility] w/ TScores -1.5 to -2.3.Marland KitchenMarland KitchenMarland Kitchen this was improved from 12/05 values... ~  labs 9/09 showed Vit D leve3l = 32... rec- OTC Vit D supplement. ~  BMD 10/09 at [facility] showed TScores -1.4 to -2.2 (sl better in Spine, sl worse in hip)- continue present meds. ~  f/u BMD due 10/11> done at [facility] w/ TScores -0.9 in Spine (16% improved), & -1.8 in left FemNeck (6% worse); continue Alendronate. ~  Labs 3/12 showed Vit D level = 39... rec OTC Vit D supplement ~2000u daily... ~  [facility] 1/14> UTI, mult issues, & FTT- to [facility] for PT etc => improved...  ANXIETY DISORDER, GENERALIZED (ICD-300.02) - on ALPRAZOLAM 0.25mg  Prn... her daughter's husb has Melanoma & it is very stressful to the family.  Health Maintenance - she gets the yearly FLU vaccine;  has PNEUMOVAX 9/09;  ?last Tetanus vaccine...   Past Surgical History  Procedure Laterality Date  . Cataract extraction    . Mastectomy      for breast cancer    Outpatient Encounter Prescriptions as of 10/01/2012  Medication Sig Dispense Refill  . acetaminophen (TYLENOL) 325 MG tablet Take 2 tablets (650 mg total) by mouth every 6 (six) hours as needed (or Fever >/= 101).      Marland Kitchen alendronate (  FOSAMAX) 70 MG tablet Take 70 mg by mouth every 7 (seven) days. Take with a full glass of water on an empty stomach.      . calcium  carbonate (CALCIUM 600) 600 MG TABS Take 600 mg by mouth 2 (two) times daily with a meal.      . carvedilol (COREG) 12.5 MG tablet Take 12.5 mg by mouth 2 (two) times daily with a meal.      . Cholecalciferol (VITAMIN D3) 1000 UNITS CAPS Take 2 capsules by mouth daily.      . cholestyramine light (PREVALITE) 4 G packet Take 1 packet (4 g total) by mouth daily.      . feeding supplement (ENSURE) PUDG Take 1 Container by mouth 3 (three) times daily between meals.      . furosemide (LASIX) 40 MG tablet Take 40 mg by mouth daily.      Marland Kitchen lisinopril (PRINIVIL,ZESTRIL) 40 MG tablet Take 1 tablet (40 mg total) by mouth daily.  90 tablet  3  . loperamide (IMODIUM) 2 MG capsule Take 2 mg by mouth 2 (two) times daily. As needed for diarrhea      . metFORMIN (GLUCOPHAGE) 500 MG tablet TAKE 1 TABLET EVERY MORNING  90 tablet  2  . minoxidil (LONITEN) 10 MG tablet Take 10 mg by mouth daily.      . Multiple Vitamins-Minerals (CENTRUM SILVER ADULT 50+ PO) Take 1 tablet by mouth daily.      Marland Kitchen saccharomyces boulardii (FLORASTOR) 250 MG capsule Take 250 mg by mouth daily.      . silver sulfADIAZINE (SILVADENE) 1 % cream Use as directed  50 g  1  . simvastatin (ZOCOR) 20 MG tablet Take 20 mg by mouth at bedtime.       No facility-administered encounter medications on file as of 10/01/2012.    Allergies  Allergen Reactions  . Nitrofurantoin     REACTION: unsure of reaction--happened long ago  . Tramadol Nausea Only  . Codeine Nausea And Vomiting, Swelling and Rash    Current Medications, Allergies, Past Medical History, Past Surgical History, Family History, and Social History were reviewed in [employer] record.    Review of Systems        See HPI - all other systems neg except as noted...  The patient complains of dyspnea on exertion and difficulty walking.  The patient denies anorexia, fever, weight loss, weight gain, vision loss, decreased hearing, hoarseness, chest pain, syncope,  peripheral edema, prolonged cough, headaches, hemoptysis, abdominal pain, melena, hematochezia, severe indigestion/heartburn, hematuria, incontinence, muscle weakness, suspicious skin lesions, transient blindness, depression, unusual weight change, abnormal bleeding, enlarged lymph nodes, and angioedema.     Objective:   Physical Exam    WD, WN, 79 y/o WF in NAD but chr ill appearing... GENERAL:  Alert & oriented; pleasant & cooperative... HEENT:  [place]/AT, EOM-wnl, PERRLA, EACs-clear, TMs-wnl, NOSE-clear, THROAT-clear & wnl. NECK:  Supple w/ fairROM; no JVD; normal carotid impulses w/o bruits; no thyromegaly or nodules palpated; no lymphadenopathy. CHEST:  Clear to P & A; without wheezes/ rales/ or rhonchi heard... HEART:  Regular Rhythm; without murmurs/ rubs/ or gallops detected... ABDOMEN:  Soft & nontender; mild panniculus; normal bowel sounds; no organomegaly or masses palpated... EXT: without deformities or arthritic changes; no varicose veins/ +venous insuffic/ 3+ edema. NEURO:  CN's intact;  no focal neuro deficits... DERM:  Dry skin dermatitis on LEs; right lower leg is wrapped...  RADIOLOGY DATA:  Reviewed in the EPIC  EMR & discussed w/ the patient...  LABORATORY DATA:  Reviewed in the EPIC EMR & discussed w/ the patient...   Assessment & Plan:    HBP>  On 4-5 med regimen adjusted during her 1/14 Hosp; BP is good & she is feeling better; K=5.5, BUN=24, Creat=1.3; therefore keep meds the same for now & ROV 28mo recheck...  CAD>  On ASA daily, asymptomatic & encouraged to exercise regularly w/ her rehab program...  Ven Insuffic>  She has VI, chr edema & dry skin dermatitis; she continues dressing changes; DrGates added Zaroxyln 2.65M&F at the [facility] & sent her home on this; we had decreased her Lasix from 40Bid to 40Qam 3 wks ago due to Creat up to 1.8;   CHOL>  On Simva20 + diet;  Continue same...  DM>  On Metformin 500mg /d + diet;  Continue same...  GI> hx Divertics & Polyp>  She  is improving w/ resolution of diarrhea- rec to wean Probiotic & Immodium...  Hx Breast Cancer>  Doing satis w/o recurrent disease...  DJD, Osteoporosis>  Doing satis on the Fosamax etc...  Anxiety>  Off Alprazolam now & hasn't needed...   Patient's Medications  New Prescriptions   No medications on file  Previous Medications   ACETAMINOPHEN (TYLENOL) 325 MG TABLET    Take 2 tablets (650 mg total) by mouth every 6 (six) hours as needed (or Fever >/= 101).   ALENDRONATE (FOSAMAX) 70 MG TABLET    Take 70 mg by mouth every 7 (seven) days. Take with a full glass of water on an empty stomach.   CALCIUM CARBONATE (CALCIUM 600) 600 MG TABS    Take 600 mg by mouth 2 (two) times daily with a meal.   CARVEDILOL (COREG) 12.5 MG TABLET    Take 12.5 mg by mouth 2 (two) times daily with a meal.   CHOLECALCIFEROL (VITAMIN D3) 1000 UNITS CAPS    Take 2 capsules by mouth daily.   CHOLESTYRAMINE LIGHT (PREVALITE) 4 G PACKET    Take 1 packet (4 g total) by mouth daily.   FEEDING SUPPLEMENT (ENSURE) PUDG    Take 1 Container by mouth 3 (three) times daily between meals.   FUROSEMIDE (LASIX) 40 MG TABLET    Take 40 mg by mouth daily.   LISINOPRIL (PRINIVIL,ZESTRIL) 40 MG TABLET    Take 1 tablet (40 mg total) by mouth daily.   LOPERAMIDE (IMODIUM) 2 MG CAPSULE    Take 2 mg by mouth 2 (two) times daily. As needed for diarrhea   METFORMIN (GLUCOPHAGE) 500 MG TABLET    TAKE 1 TABLET EVERY MORNING   METOLAZONE (ZAROXOLYN) 2.5 MG TABLET    Take one tablet by mouth on Monday and friday   MINOXIDIL (LONITEN) 10 MG TABLET    Take 10 mg by mouth daily.   MULTIPLE VITAMINS-MINERALS (CENTRUM SILVER ADULT 50+ PO)    Take 1 tablet by mouth daily.   POTASSIUM CHLORIDE SA (K-DUR,KLOR-CON) 20 MEQ TABLET    Take one tablet by mouth on Tuesday, Wednesday and thursday   SACCHAROMYCES BOULARDII (FLORASTOR) 250 MG CAPSULE    Take 250 mg by mouth daily.   SILVER SULFADIAZINE (SILVADENE) 1 % CREAM    Use as directed   SIMVASTATIN  (ZOCOR) 20 MG TABLET    Take 20 mg by mouth at bedtime.  Modified Medications   No medications on file  Discontinued Medications   No medications on file

## 2012-10-02 DIAGNOSIS — E119 Type 2 diabetes mellitus without complications: Secondary | ICD-10-CM | POA: Diagnosis not present

## 2012-10-02 DIAGNOSIS — I10 Essential (primary) hypertension: Secondary | ICD-10-CM | POA: Diagnosis not present

## 2012-10-03 DIAGNOSIS — I10 Essential (primary) hypertension: Secondary | ICD-10-CM | POA: Diagnosis not present

## 2012-10-03 DIAGNOSIS — E119 Type 2 diabetes mellitus without complications: Secondary | ICD-10-CM | POA: Diagnosis not present

## 2012-10-04 DIAGNOSIS — M79609 Pain in unspecified limb: Secondary | ICD-10-CM | POA: Diagnosis not present

## 2012-10-04 DIAGNOSIS — B351 Tinea unguium: Secondary | ICD-10-CM | POA: Diagnosis not present

## 2012-10-05 ENCOUNTER — Other Ambulatory Visit: Payer: Self-pay | Admitting: Pulmonary Disease

## 2012-10-07 DIAGNOSIS — I10 Essential (primary) hypertension: Secondary | ICD-10-CM | POA: Diagnosis not present

## 2012-10-07 DIAGNOSIS — E119 Type 2 diabetes mellitus without complications: Secondary | ICD-10-CM | POA: Diagnosis not present

## 2012-10-08 DIAGNOSIS — E119 Type 2 diabetes mellitus without complications: Secondary | ICD-10-CM | POA: Diagnosis not present

## 2012-10-08 DIAGNOSIS — I10 Essential (primary) hypertension: Secondary | ICD-10-CM | POA: Diagnosis not present

## 2012-10-10 DIAGNOSIS — E119 Type 2 diabetes mellitus without complications: Secondary | ICD-10-CM | POA: Diagnosis not present

## 2012-10-10 DIAGNOSIS — I10 Essential (primary) hypertension: Secondary | ICD-10-CM | POA: Diagnosis not present

## 2012-10-17 ENCOUNTER — Telehealth: Payer: Self-pay | Admitting: Pulmonary Disease

## 2012-10-17 DIAGNOSIS — E119 Type 2 diabetes mellitus without complications: Secondary | ICD-10-CM | POA: Diagnosis not present

## 2012-10-17 DIAGNOSIS — R599 Enlarged lymph nodes, unspecified: Secondary | ICD-10-CM

## 2012-10-17 DIAGNOSIS — I10 Essential (primary) hypertension: Secondary | ICD-10-CM | POA: Diagnosis not present

## 2012-10-17 NOTE — Telephone Encounter (Signed)
Last OV 10-01-12. Pt states this AM she noticed a knot on neck, under her chin on the left side.  She states it is the size of a hickory nut. She states it is not painful, no redness, or heat to the area. Pt is unable to come in for OV due to transportation. Pt denies any sore throat, trouble swallowing, drainage, cough at this time. Pt states knot is only on one side. Pt was concerned about this and wanted to make Dr. Kriste Basque aware. Please advise if you have any recs. Carron Curie, CMA Allergies  Allergen Reactions  . Nitrofurantoin     REACTION: unsure of reaction--happened long ago  . Tramadol Nausea Only  . Codeine Nausea And Vomiting, Swelling and Rash

## 2012-10-17 NOTE — Telephone Encounter (Signed)
Per SN---  Will need to use lemon drops Lozenges Keep a check on the swelling.  If the swelling gets worse through the night or any pain ---she will need to be evaluated at the ER.  appt has been made for the pt with ENT---Dr. Pollyann Kennedy at 2:40 on 3/14.  Called and spoke with tina--pts granddaughter and she is aware of appt date and time and location.  Nothing further is needed.

## 2012-10-18 ENCOUNTER — Ambulatory Visit: Payer: Medicare Other | Admitting: Internal Medicine

## 2012-10-18 ENCOUNTER — Other Ambulatory Visit (HOSPITAL_COMMUNITY)
Admission: RE | Admit: 2012-10-18 | Discharge: 2012-10-18 | Disposition: A | Discharge status: Home / Self Care | Payer: Medicare Other | Source: Ambulatory Visit | Attending: Otolaryngology | Admitting: Otolaryngology

## 2012-10-18 ENCOUNTER — Other Ambulatory Visit: Payer: Self-pay | Admitting: Otolaryngology

## 2012-10-18 ENCOUNTER — Telehealth: Payer: Self-pay | Admitting: Pulmonary Disease

## 2012-10-18 DIAGNOSIS — K1120 Sialoadenitis, unspecified: Secondary | ICD-10-CM | POA: Diagnosis not present

## 2012-10-18 DIAGNOSIS — R221 Localized swelling, mass and lump, neck: Secondary | ICD-10-CM | POA: Diagnosis not present

## 2012-10-18 DIAGNOSIS — R220 Localized swelling, mass and lump, head: Secondary | ICD-10-CM | POA: Diagnosis not present

## 2012-10-18 MED ORDER — METFORMIN HCL 500 MG PO TABS: 500.0000 mg | ORAL_TABLET | Freq: Every day | ORAL | Status: DC

## 2012-10-18 MED ORDER — LISINOPRIL 40 MG PO TABS: 40.0000 mg | ORAL_TABLET | Freq: Every day | ORAL | Status: DC

## 2012-10-18 MED ORDER — CARVEDILOL 12.5 MG PO TABS: 12.5000 mg | ORAL_TABLET | Freq: Two times a day (BID) | ORAL | Status: DC

## 2012-10-18 MED ORDER — SILVER SULFADIAZINE 1 % EX CREA: TOPICAL_CREAM | CUTANEOUS | Status: DC

## 2012-10-18 NOTE — Telephone Encounter (Signed)
[facility]ill have been sent [facility] to the [facility] per requested.  i called and lmom for tina to call back to make her aware.

## 2012-10-21 NOTE — Telephone Encounter (Signed)
I left message for Audrey Reed to inform her of Rx's sent; I also called patient's home number-pt is aware RX's sent and will relay message to [name](caller). Nothing more needed at this time.

## 2012-10-23 ENCOUNTER — Encounter: Payer: Self-pay | Admitting: Pulmonary Disease

## 2012-10-23 ENCOUNTER — Ambulatory Visit (INDEPENDENT_AMBULATORY_CARE_PROVIDER_SITE_OTHER): Payer: Medicare Other | Admitting: Pulmonary Disease

## 2012-10-23 ENCOUNTER — Other Ambulatory Visit (INDEPENDENT_AMBULATORY_CARE_PROVIDER_SITE_OTHER): Payer: Medicare Other

## 2012-10-23 VITALS — BP 110/50 | HR 71 | Temp 96.6°F | Ht 61.0 in | Wt 158.8 lb

## 2012-10-23 DIAGNOSIS — M810 Age-related osteoporosis without current pathological fracture: Secondary | ICD-10-CM

## 2012-10-23 DIAGNOSIS — R0600 Dyspnea, unspecified: Secondary | ICD-10-CM

## 2012-10-23 DIAGNOSIS — R0609 Other forms of dyspnea: Secondary | ICD-10-CM

## 2012-10-23 DIAGNOSIS — I872 Venous insufficiency (chronic) (peripheral): Secondary | ICD-10-CM | POA: Diagnosis not present

## 2012-10-23 DIAGNOSIS — R0989 Other specified symptoms and signs involving the circulatory and respiratory systems: Secondary | ICD-10-CM | POA: Diagnosis not present

## 2012-10-23 DIAGNOSIS — I10 Essential (primary) hypertension: Secondary | ICD-10-CM | POA: Diagnosis not present

## 2012-10-23 DIAGNOSIS — R609 Edema, unspecified: Secondary | ICD-10-CM | POA: Diagnosis not present

## 2012-10-23 DIAGNOSIS — M545 Low back pain: Secondary | ICD-10-CM

## 2012-10-23 DIAGNOSIS — E119 Type 2 diabetes mellitus without complications: Secondary | ICD-10-CM

## 2012-10-23 DIAGNOSIS — I2510 Atherosclerotic heart disease of native coronary artery without angina pectoris: Secondary | ICD-10-CM | POA: Diagnosis not present

## 2012-10-23 DIAGNOSIS — E7800 Pure hypercholesterolemia, unspecified: Secondary | ICD-10-CM

## 2012-10-23 DIAGNOSIS — C50919 Malignant neoplasm of unspecified site of unspecified female breast: Secondary | ICD-10-CM

## 2012-10-23 DIAGNOSIS — R627 Adult failure to thrive: Secondary | ICD-10-CM

## 2012-10-23 DIAGNOSIS — M159 Polyosteoarthritis, unspecified: Secondary | ICD-10-CM

## 2012-10-23 LAB — BASIC METABOLIC PANEL
BUN: 41 mg/dL — ABNORMAL HIGH (ref 6–23)
Chloride: 108 mEq/L (ref 96–112)
GFR: 38.58 mL/min — ABNORMAL LOW (ref >60.00)
Potassium: 5.5 mEq/L — ABNORMAL HIGH (ref 3.5–5.1)
Sodium: 137 mEq/L (ref 135–145)

## 2012-10-23 LAB — BRAIN NATRIURETIC PEPTIDE: Pro B Natriuretic peptide (BNP): 209 pg/mL — ABNORMAL HIGH (ref 0.0–100.0)

## 2012-10-23 NOTE — Patient Instructions (Addendum)
Today we updated your med list in our EPIC system...    Continue your current medications the same...  Today we rechecked your important lab work to check your electrolytes and renal function...    We will contact you w/ the results when available & recommend any change in your diuretics at that time...  Let's plan a follow up visit again is 4-6 weeks.Marland KitchenMarland Kitchen

## 2012-10-24 DIAGNOSIS — E119 Type 2 diabetes mellitus without complications: Secondary | ICD-10-CM | POA: Diagnosis not present

## 2012-10-24 DIAGNOSIS — I10 Essential (primary) hypertension: Secondary | ICD-10-CM | POA: Diagnosis not present

## 2012-10-25 ENCOUNTER — Telehealth: Payer: Self-pay | Admitting: Pulmonary Disease

## 2012-10-25 MED ORDER — SILVER SULFADIAZINE 1 % EX CREA: TOPICAL_CREAM | CUTANEOUS | Status: AC

## 2012-10-25 MED ORDER — LISINOPRIL 40 MG PO TABS: 40.0000 mg | ORAL_TABLET | Freq: Every day | ORAL | Status: DC

## 2012-10-25 MED ORDER — METFORMIN HCL 500 MG PO TABS: 500.0000 mg | ORAL_TABLET | Freq: Every day | ORAL | Status: DC

## 2012-10-25 MED ORDER — CARVEDILOL 12.5 MG PO TABS: 12.5000 mg | ORAL_TABLET | Freq: Two times a day (BID) | ORAL | Status: DC

## 2012-10-25 NOTE — Telephone Encounter (Signed)
Called and spoke with pt son and he is aware of refills sent in to the pharmacy and nothing further is needed.

## 2012-10-28 ENCOUNTER — Other Ambulatory Visit: Payer: Self-pay | Admitting: Pulmonary Disease

## 2012-10-28 ENCOUNTER — Telehealth: Payer: Self-pay | Admitting: Pulmonary Disease

## 2012-10-28 MED ORDER — METOLAZONE 2.5 MG PO TABS: ORAL_TABLET | ORAL | Status: DC

## 2012-10-28 MED ORDER — SIMVASTATIN 20 MG PO TABS: 20.0000 mg | ORAL_TABLET | Freq: Every day | ORAL | Status: DC

## 2012-10-28 MED ORDER — SACCHAROMYCES BOULARDII 250 MG PO CAPS: 250.0000 mg | ORAL_CAPSULE | Freq: Every day | ORAL | Status: DC

## 2012-10-28 MED ORDER — CHOLESTYRAMINE LIGHT 4 G PO PACK: 4.0000 g | PACK | Freq: Every day | ORAL | Status: DC

## 2012-10-28 MED ORDER — MINOXIDIL 10 MG PO TABS: 10.0000 mg | ORAL_TABLET | Freq: Every day | ORAL | Status: DC

## 2012-10-28 NOTE — Telephone Encounter (Signed)
Rx has been called in # 8 with 2 refills. Nothing further needed at this time.

## 2012-10-28 NOTE — Telephone Encounter (Signed)
ATC [facility] and was not able to get anyone x 5 min. I do not see where we have refilled metolazone 2.5 mg 1 tab on Monday and Friday according to EPIC. Please advise if okay to refill SN thanks Last OV 10/23/12

## 2012-10-28 NOTE — Telephone Encounter (Signed)
Per SN---ok to refill meds for the pt thanks.

## 2012-10-30 DIAGNOSIS — I10 Essential (primary) hypertension: Secondary | ICD-10-CM | POA: Diagnosis not present

## 2012-10-30 DIAGNOSIS — E119 Type 2 diabetes mellitus without complications: Secondary | ICD-10-CM | POA: Diagnosis not present

## 2012-10-31 DIAGNOSIS — I10 Essential (primary) hypertension: Secondary | ICD-10-CM | POA: Diagnosis not present

## 2012-10-31 DIAGNOSIS — E119 Type 2 diabetes mellitus without complications: Secondary | ICD-10-CM | POA: Diagnosis not present

## 2012-11-03 ENCOUNTER — Emergency Department (HOSPITAL_COMMUNITY): Payer: Medicare Other

## 2012-11-03 ENCOUNTER — Emergency Department (HOSPITAL_COMMUNITY)
Admission: EM | Admit: 2012-11-03 | Discharge: 2012-11-03 | Disposition: A | Discharge status: Home / Self Care | Payer: Medicare Other | Attending: Emergency Medicine | Admitting: Emergency Medicine

## 2012-11-03 ENCOUNTER — Encounter (HOSPITAL_COMMUNITY): Payer: Self-pay | Admitting: Emergency Medicine

## 2012-11-03 DIAGNOSIS — Z860100 Personal history of colon polyps, unspecified: Secondary | ICD-10-CM | POA: Insufficient documentation

## 2012-11-03 DIAGNOSIS — I10 Essential (primary) hypertension: Secondary | ICD-10-CM | POA: Insufficient documentation

## 2012-11-03 DIAGNOSIS — W1809XA Striking against other object with subsequent fall, initial encounter: Secondary | ICD-10-CM | POA: Insufficient documentation

## 2012-11-03 DIAGNOSIS — S0990XA Unspecified injury of head, initial encounter: Secondary | ICD-10-CM | POA: Diagnosis not present

## 2012-11-03 DIAGNOSIS — Z8739 Personal history of other diseases of the musculoskeletal system and connective tissue: Secondary | ICD-10-CM | POA: Diagnosis not present

## 2012-11-03 DIAGNOSIS — E119 Type 2 diabetes mellitus without complications: Secondary | ICD-10-CM | POA: Diagnosis not present

## 2012-11-03 DIAGNOSIS — Z8719 Personal history of other diseases of the digestive system: Secondary | ICD-10-CM | POA: Insufficient documentation

## 2012-11-03 DIAGNOSIS — Z8679 Personal history of other diseases of the circulatory system: Secondary | ICD-10-CM | POA: Insufficient documentation

## 2012-11-03 DIAGNOSIS — Z8659 Personal history of other mental and behavioral disorders: Secondary | ICD-10-CM | POA: Diagnosis not present

## 2012-11-03 DIAGNOSIS — I2510 Atherosclerotic heart disease of native coronary artery without angina pectoris: Secondary | ICD-10-CM | POA: Diagnosis not present

## 2012-11-03 DIAGNOSIS — R51 Headache: Secondary | ICD-10-CM | POA: Diagnosis not present

## 2012-11-03 DIAGNOSIS — Z87891 Personal history of nicotine dependence: Secondary | ICD-10-CM | POA: Insufficient documentation

## 2012-11-03 DIAGNOSIS — Y9289 Other specified places as the place of occurrence of the external cause: Secondary | ICD-10-CM | POA: Insufficient documentation

## 2012-11-03 DIAGNOSIS — E7800 Pure hypercholesterolemia, unspecified: Secondary | ICD-10-CM | POA: Diagnosis not present

## 2012-11-03 DIAGNOSIS — R221 Localized swelling, mass and lump, neck: Secondary | ICD-10-CM | POA: Diagnosis not present

## 2012-11-03 DIAGNOSIS — S0180XA Unspecified open wound of other part of head, initial encounter: Secondary | ICD-10-CM | POA: Insufficient documentation

## 2012-11-03 DIAGNOSIS — W19XXXA Unspecified fall, initial encounter: Secondary | ICD-10-CM

## 2012-11-03 DIAGNOSIS — Z79899 Other long term (current) drug therapy: Secondary | ICD-10-CM | POA: Diagnosis not present

## 2012-11-03 DIAGNOSIS — Z853 Personal history of malignant neoplasm of breast: Secondary | ICD-10-CM | POA: Insufficient documentation

## 2012-11-03 DIAGNOSIS — R220 Localized swelling, mass and lump, head: Secondary | ICD-10-CM | POA: Diagnosis not present

## 2012-11-03 DIAGNOSIS — S0181XA Laceration without foreign body of other part of head, initial encounter: Secondary | ICD-10-CM

## 2012-11-03 DIAGNOSIS — Y9389 Activity, other specified: Secondary | ICD-10-CM | POA: Insufficient documentation

## 2012-11-03 NOTE — ED Notes (Signed)
Pt was at [facility] in the bathroom when she fell. Pt hit right side of head 1st, then attempted to get up and fell again hitting left side of head. Pt takes Plavix powder form. Pt did not have her Plavix dose today. Pt denies +LOC.

## 2012-11-03 NOTE — ED Provider Notes (Signed)
History     CSN: [id number]  Arrival date & time 11/03/12  1821   First MD Initiated Contact with Patient 11/03/12 1958      Chief Complaint  Patient presents with  . Fall    (Consider location/radiation/quality/duration/timing/severity/associated sxs/prior treatment) HPI Comments: [name] is a 79 year old lady, followed by Dr. Kriste Basque.  He, states she was in [employer] bathroom attempting to pull up her pants.  When she lost her balance and fell to the floor, striking the left side of her head, sustaining a small laceration at the corner of the lateral eyebrow.  When she attempted to stand and pull her pants up she again fell, hitting the right side of her head on the sink sustaining a superficial skin tear to the lateral aspect of the right eyebrow.  She denies any loss of consciousness, headache, nausea, visual changes, neck pain, or any other injury.  Patient is a 79 y.o. female presenting with fall. The history is provided by the patient.  Fall The accident occurred 1 to 2 hours ago. The fall occurred while standing. She fell from a height of 3 to 5 ft. She landed on a hard floor. The point of impact was the head. The pain is present in the head. The pain is at a severity of 2/10. The pain is mild. She was ambulatory at the scene. There was no entrapment after the fall. There was no drug use involved in the accident. There was no alcohol use involved in the accident. Pertinent negatives include no visual change, no fever, no numbness, no nausea and no headaches. She has tried nothing for the symptoms.    Past Medical History  Diagnosis Date  . HTN (hypertension)   . CAD (coronary artery disease)   . Venous insufficiency   . Hypercholesterolemia   . DM (diabetes mellitus)   . Diverticulosis of colon   . Colon polyp 06/26/03    9 mm sessile polyp 110 cm from anus; not retrieved  . Adenocarcinoma of breast   . DJD (degenerative joint disease)   . Osteoporosis   . Anxiety disorder    . Dementia     Past Surgical History  Procedure Laterality Date  . Cataract extraction    . Mastectomy      for breast cancer    Family History  Problem Relation Age of Onset  . Heart disease Father   . Heart disease Mother   . Alzheimer's disease Sister   . Alzheimer's disease Sister   . Heart attack Sister   . Heart attack Sister     History  Substance Use Topics  . Smoking status: Former Smoker    Quit date: 08/08/1987  . Smokeless tobacco: Not on file  . Alcohol Use: No    OB History   Grav Para Term Preterm Abortions TAB SAB Ect Mult Living                  Review of Systems  Constitutional: Negative for fever and chills.  HENT: Positive for facial swelling. Negative for neck pain and neck stiffness.   Eyes: Negative for visual disturbance.  Gastrointestinal: Negative for nausea.  Musculoskeletal: Negative for joint swelling and arthralgias.  Skin: Positive for wound.  Neurological: Negative for numbness and headaches.  All other systems reviewed and are negative.    Allergies  Nitrofurantoin; Tramadol; and Codeine  Home Medications   Current Outpatient Rx  Name  Route  Sig  Dispense  Refill  .  acetaminophen (TYLENOL) 325 MG tablet   Oral   Take 2 tablets (650 mg total) by mouth every 6 (six) hours as needed (or Fever >/= 101).         Marland Kitchen alendronate (FOSAMAX) 70 MG tablet   Oral   Take 70 mg by mouth every 7 (seven) days. Take with a full glass of water on an empty stomach. Take on Wednesday         . calcium carbonate (CALCIUM 600) 600 MG TABS   Oral   Take 600 mg by mouth 2 (two) times daily with a meal.         . carvedilol (COREG) 12.5 MG tablet   Oral   Take 1 tablet (12.5 mg total) by mouth 2 (two) times daily with a meal.   180 tablet   3   . Cholecalciferol (VITAMIN D3) 1000 UNITS CAPS   Oral   Take 2 capsules by mouth daily.         . cholestyramine light (PREVALITE) 4 G packet   Oral   Take 1 packet (4 g total) by  mouth daily.   30 packet   11   . feeding supplement (ENSURE) PUDG   Oral   Take 1 Container by mouth 3 (three) times daily between meals.         . furosemide (LASIX) 40 MG tablet   Oral   Take 40 mg by mouth daily.         Marland Kitchen lisinopril (PRINIVIL,ZESTRIL) 40 MG tablet   Oral   Take 1 tablet (40 mg total) by mouth daily.   90 tablet   3   . loperamide (IMODIUM) 2 MG capsule   Oral   Take 2 mg by mouth 2 (two) times daily. As needed for diarrhea         . metFORMIN (GLUCOPHAGE) 500 MG tablet   Oral   Take 1 tablet (500 mg total) by mouth daily with breakfast.   90 tablet   3   . metolazone (ZAROXOLYN) 2.5 MG tablet      Take one tablet by mouth on Monday and friday   8 tablet   2   . minoxidil (LONITEN) 10 MG tablet   Oral   Take 1 tablet (10 mg total) by mouth daily.   30 tablet   11   . Multiple Vitamins-Minerals (CENTRUM SILVER ADULT 50+ PO)   Oral   Take 1 tablet by mouth daily.         . potassium chloride SA (K-DUR,KLOR-CON) 20 MEQ tablet      Take one tablet by mouth on Tuesday, Wednesday and thursday         . saccharomyces boulardii (FLORASTOR) 250 MG capsule   Oral   Take 1 capsule (250 mg total) by mouth daily.   30 capsule   11   . silver sulfADIAZINE (SILVADENE) 1 % cream      Use as directed   50 g   5   . simvastatin (ZOCOR) 20 MG tablet   Oral   Take 1 tablet (20 mg total) by mouth at bedtime.   30 tablet   11     BP 154/43  Pulse 63  Temp(Src) 97.5 F (36.4 C) (Oral)  Resp 20  SpO2 98%  Physical Exam  Constitutional: She appears well-developed and well-nourished. No distress.  HENT:  Head: Normocephalic.  Right Ear: External ear normal.  Left Ear: External ear normal.  Eyes:  Pupils are equal, round, and reactive to light.  Neck: Normal range of motion. Neck supple.  Cardiovascular: Normal rate.   Pulmonary/Chest: Effort normal.  Abdominal: Soft.  Musculoskeletal: Normal range of motion. She exhibits no  edema and no tenderness.  Neurological: She is alert.  Patient is aware of self and surroundings.  She knows family members names.  Does not know date  Skin: Skin is warm.    ED Course  LACERATION REPAIR Date/Time: 11/03/2012 9:03 PM Performed by: Arman Filter Authorized by: Arman Filter Consent: Verbal consent obtained. Risks and benefits: risks, benefits and alternatives were discussed Consent given by: patient Patient understanding: patient states understanding of the procedure being performed Patient identity confirmed: verbally with patient and hospital-assigned identification number Time out: Immediately prior to procedure a "time out" was called to verify the correct patient, procedure, equipment, support staff and site/side marked as required. Body area: head/neck Location details: forehead Laceration length: 1 cm Foreign bodies: unknown Tendon involvement: none Nerve involvement: none Vascular damage: no Anesthesia: local infiltration Local anesthetic: lidocaine 1% with epinephrine Anesthetic total: 1 ml Preparation: Patient was prepped and draped in the usual sterile fashion. Irrigation solution: saline Irrigation method: syringe Amount of cleaning: extensive Debridement: none Degree of undermining: none Skin closure: 6-0 Prolene Number of sutures: 3 Technique: simple Approximation: close Dressing: antibiotic ointment Patient tolerance: Patient tolerated the procedure well with no immediate complications.   (including critical care time)  Labs Reviewed - No data to display Ct Head Wo Contrast  11/03/2012  *RADIOLOGY REPORT*  Clinical Data: Periorbital swelling status post fall today.  CT HEAD WITHOUT CONTRAST  Technique:  Contiguous axial images were obtained from the base of the skull through the vertex without contrast.  Comparison: MRI brain 08/05/2012.  Head CT 08/04/2012.  Findings: There is no evidence of acute intracranial hemorrhage, mass effect, brain  edema or extra-axial fluid collection.  The ventricles and subarachnoid spaces are diffusely prominent consistent with moderate atrophy.  No acute cortical infarction is seen.  Moderate periventricular white matter disease appears stable.  There is no hydrocephalus.  The visualized paranasal sinuses are clear. The calvarium is intact with diffuse demineralization. No focal soft tissue swelling is evident.  IMPRESSION: Stable examination.  No acute or reversible findings identified.   Original Report Authenticated By: Carey Bullocks, M.D.      1. Fall, initial encounter   2. Laceration of face, initial encounter    LACERATION REPAIR Performed by: Arman Filter Authorized by: Arman Filter Consent: Verbal consent obtained. Risks and benefits: risks, benefits and alternatives were discussed Consent given by: patient Patient identity confirmed: provided demographic data Prepped and Draped in normal sterile fashion Wound explored  Laceration Location: R eyebroa  Laceration Length: .75cm  No Foreign Bodies seen or palpated  Anesthesia: local infiltration  Local anesthetic: lidocaine 1% with epinephrine  Anesthetic total: 1 ml  Irrigation method: syringe Amount of cleaning: standard  Skin closure: simple  Number of sutures: 2  Technique: simple  Patient tolerance: Patient tolerated the procedure well with no immediate complications.   MDM   Patient.  Require sutures to the left, laceration, most likely Dermabond to the right skin tear.  She also require a head CT For clarification.  Patient is not on Plavix       Arman Filter, NP 11/03/12 2109  Arman Filter, NP 11/03/12 2128

## 2012-11-03 NOTE — ED Notes (Signed)
Pt states had Tetanus vaccine 3 years ago.

## 2012-11-04 ENCOUNTER — Telehealth: Payer: Self-pay | Admitting: Pulmonary Disease

## 2012-11-04 NOTE — ED Provider Notes (Signed)
Medical screening examination/treatment/procedure(s) were conducted as a shared visit with non-physician practitioner(s) and myself.  I personally evaluated the patient during the encounter  Pt tolerated laceration repair well.  Able to ambulate without difficulty.  PT and family comfortable with discharge.  Celene Kras, MD 11/04/12 [id number]

## 2012-11-04 NOTE — Telephone Encounter (Signed)
Spoke with pt made appt with TP on Friday to  Have stitches removed.  Nothing further needed.

## 2012-11-05 DIAGNOSIS — D37039 Neoplasm of uncertain behavior of the major salivary glands, unspecified: Secondary | ICD-10-CM | POA: Diagnosis not present

## 2012-11-08 ENCOUNTER — Ambulatory Visit (INDEPENDENT_AMBULATORY_CARE_PROVIDER_SITE_OTHER): Payer: Medicare Other | Admitting: Adult Health

## 2012-11-08 ENCOUNTER — Encounter: Payer: Self-pay | Admitting: Adult Health

## 2012-11-08 VITALS — BP 110/72 | HR 67 | Temp 96.7°F | Ht 61.0 in | Wt 159.2 lb

## 2012-11-08 DIAGNOSIS — Z4802 Encounter for removal of sutures: Secondary | ICD-10-CM | POA: Diagnosis not present

## 2012-11-08 NOTE — Progress Notes (Signed)
Subjective:    Patient ID: Audrey Reed, female    DOB: [birth year], 79 y.o.   MRN: [id number]  HPI 79 y/o WF   ~  October 17, 2011:  47mo ROV & Audrey Reed is remarkably stable at 43 and has no new complaints or concerns> BP is fairly well controlled on regimen & she is reminded to take then regularly every day;  Lipids have been at goal on Simva20 + diet;  DM control similarly adeq on Metformin monotherapy;  She continues to see her chiropractor regularly & takes Fosamax for her osteroporosis;  She is not fasting today & she requested to wait until her next f/u visit to repeat fasting blood work...  ~  April 22, 2012:  47mo ROV & Audrey Reed feels well- no new complaints or concerns, but BP noted to be elev at 180/82 today, not checking BP at home, & claims she is taking her regimen regularly> Coreg12.5Bid, Labetolol200Bid, Lisinopril20, Lasix40> we decided to incr the Lisinopril to 40mg /d & plan short term f/u for further adjustments... She has a wound on left lat leg w/ escar & we discussed dressing changes & local care... Chol & DM are well regulated on meds below...    We reviewed prob list, meds, xrays and labs> see below >> OK Flu vaccine today... EKG 9/13 showed SBrady, rate47, RBBB, NSSTTWA... LABS 9/13:  FLP- at goals on Simva20;  Chems- ok on Metform500 w/ BS=149 A1c=5.9 Creat=1.2;  CBC- wnl;  TSH=1.54...  ~  June 03, 2012:  18mo ROV & follow up BP after adjustment w/ incr Lisin20=>40/d + continue other 3 meds the same;  She brought all med bottles today but there is no Labetolol & [facility] confirms not on this med but she is taking Coreg; BP today= 150/84 so we decided to try Minoxidil 10mg /d...  She will continue to monitor BP at home & f/u w/e in 2 months...    We reviewed prob list, meds, xrays and labs> see below for updates >>   ~  September 11, 2012:  34mo ROV & post hosp check> Hosp [facility] 12/29 - 08/15/12  W/ UTI, HBP, DM, Anxiety & FTT; she developed  diarrhea on the antibiotics, CDiff neg; also had episode of asp pneum- prolonged her hosp stay & resolved w/ conservative rx; due to FTT- she agreed to palliative care consult & DNR written; she was sent to [facility] for rehab & is still there>> c/o some weeping from her legs, gas pains, wants to be disch from [facility]...    HBP, CAD, Ven Insuffic> on Coreg12.5Bid, Lisin40, Minoxidil10, Lasix40Bid; BP=110/60 & she denies CP/angina, palpit, ch in SOB/DOE; notes some edema & some "weeping"; Labs showed Creat up to 1.8 7 BNP improved to 145, therefore rec to decr Lasix40Qam...    Chol> on Simva20; last FLP was 9/13 w/ TChol 178, TG 71, HDL 80, LDL 84    DM> on Metform500Qam; Labs showed BS=125, A1c=6.0; good control, keep same...    GI> Divertics, hx polyp> followed by DrDBrodie for GI; currently taking Florastar, Immodium, Cholestyramine, Ensure pudding; diarrhea from recent hosp is improved...    Hx breast cancer> part mastectomy 2005 by DrIngram; followed by Duwayne Heck on Femara for 4-37yrs; last seen 6/11- doing well & f/u prn...    DJD, Osteopenia> on Fosamax, Calcium, MVI, VitD, Tylenol; last BMD was 10/11 at [facility] w/ TScore -1.8 in Fem Neck; continue same meds...    Debilitated & FTT> she  was disch from [place] to [facility] for rehab...    Anxiety> off prev alprazolam, she is coping well she says... We reviewed prob list, meds, xrays and labs> see below for updates >> had Flu vaccine 9/13... LABS 2/14:  Chems- ok x BS=125 A1c=6.0 Creat=1.8 BNP=145   ~  October 01, 2012:  3wk ROV & Audrey Reed is now home from [employer] & getting home therapy etc; DrGates added Zaroxyln 2.5mg  on M&F & sent her home on this; recall that we decr her Lasix from 40Bid to just Qam last visit due to Creat up from 0.8 to 1.8 & BNP was down to 145; she has lost 12# over the last 3 wks- down to 153# now but still has 2-3+ edema in LEs; wound on right leg is improved, still doing dressing changes and only min weeping reported,  BP= 152/84,  chest is clear, and heart is regular...  We decided to recheck BMet 1st & decide on adjustments => K=5.5, BUN=24, Creat=1.3; therefore keep meds the same for now & ROV 76mo...  11/08/2012 ER follow up  ER follow up and suture removal from fall last weekend in the bathroom at [facility] - doing well since; no more falls. Lost balance pulling up pants in BR stall.  CT head neg.  No chest pain, worse edema. Or syncope.  No LOC>  Sutures intact w/ no redness or drainage. No fever            Problem List:  HYPERTENSION (ICD-401.9) - controlled on COREG 12.5Bid + LISINOPRIL40, LASIX 40mg /d, off LABETOLOL...  ~  9/12:  BP= 130/72 & similar at home> feeling well & denies HA, fatigue, visual changes, CP, palipit, dizziness, syncope, dyspnea, etc... ~  3/13:  BP= 158/92 & she is reminded to take meds every day; she remains asymptomatic w/o CP, palpit, SOB, edema, etc... ~  9/13:  BP= 180/82 & she is asymptomatic; we decided to incr the LISINOPRIL to 40mg /d... ~  Hosp [facility] 12/29 - 08/15/12  W/ UTI, HBP, DM, Anxiety & FTT; she developed diarrhea on the antibiotics- CDiff neg; also had episode of asp pneum- prolonged her hosp stay & resolved w/ conservative rx; due to FTT- she agreed to palliative care consult & DNR written; she was sent to [facility] for rehab & is still there>> c/o some weeping from her legs, gas pains, wants to be disch from [facility]... ~  09/11/12: on Coreg12.5Bid, Lisin40, Minoxidil10, Lasix40Bid; BP=110/60 & she denies CP/angina, palpit, ch in SOB/DOE; notes some edema & some "weeping"; Labs showed Creat up to 1.8 & BNP improved to 145, therefore rec to decr Lasix40Qam... ~  10/01/12: on Coreg12.5Bid, Lisin40, Minox10; Lasix40, Zaroxyln2.5-M&F (added by [facility] in [place]), K10-MWF; BP=152/84 & she feels better home from [facility] x1wk; Labs showed K=5.5, BUN=24, Creat=1.3; therefore keep meds the same for now & ROV 76mo.  CORONARY ARTERY DISEASE (ICD-414.00) - on ASA 81mg /d... no CP, palpit, SOB, etc...  exerc= yard, walking... ~  cath 2/91 by DrWeintraub showed 70% single vessel dis RCA & MVP... ~  In [place] 1/14> she developed transient AFib=> converted spont;  EKG w/ RBBB...  VENOUS INSUFFICIENCY (ICD-459.81) - she has mod Ven Insuffic w/ chr edema & dry skin dermatitis> knows to follow a low sodium diet, elevate legs, wear support hose when able "[name]", & takes the LASIX daily. ~  2/14:  Zaroxyln 2.5-MWF added by DrGates in [place]; f/u labs showed K=5.5, BUN=24, Creat=1.3; therefore keep meds the same  for now & ROV 51mo  HYPERCHOLESTEROLEMIA (ICD-272.0) - on SIMVASTATIN 20mg /d... tolerating well, +diet... ~  FLP 5/08 showed TChol 162, TG 82, HDL 63, LDL 83... ~  FLP 3/09 showed TChol 179, TG 67, HDL 73, LDL 93... rec- same med. ~  FLP 3/10 showed TChol 163, TG 71, HDL 72, LDL 77 ~  FLP 2/11 showed TChol 182, TG 91, HDL 88, LDL 76... Continue same. ~  FLP 3/12 showed TChol 167, TG 78, HDL 79, LDL 73 ~  3/13: she is not fasting for labs today & declines ret for FLP, prefers to wait til ROV in 23mo... ~  FLP 9/13 on Simva20 showed TChol 178, TG 71, HDL 80, LDL 84  DM (ICD-250.00) - on diet + METFORMIN 500mg /d added 3/09... ~  labs 5/08 showed BS=166, HgA1c=6.5 ~  labs 3/09 showed BS= 161 & Metformin 500mg /d started... ~  lab 9/09 showed BS= 111, HgA1c= 6.1.Marland KitchenMarland Kitchen rec- continue same. ~  labs 3/10 showed BS= 149, A1c= 6.2 ~  labs 9/10 showed BS= 112, A1c= 5.8 ~  labs 2/11 showed BS= 115, A1c= 6.4 ~  Labs 3/12 showed BS= 135, A1c= not done... ~  3/13: she is not fasting for labs today & declines ret for FLP, prefers to wait til ROV in 23mo... ~  Labs 9/13 on Metform500 showed BS=149, A1c=5.9 ~  2/14: on Metform500Qam; Labs showed BS=125, A1c=6.0; good control, keep same...  DIVERTICULOSIS OF COLON (ICD-562.10) - last colonoscopy was 11/04 by DrDBrodie showing divertics & polyp (not retrieved)... she denies abd pain, D/ C/ blood etc...  Hx of ADENOCARCINOMA, BREAST (ICD-174.9) - Dx'd 11/05  w/ part mastectomy for papillary carcinoma left breast... surg by Lurline Hare, followed by Duwayne Heck and [facility] stopped 4/10 after 4.60yrs Rx... ~  saw Duwayne Heck 10/09- doing well, labs OK... f/u Mammogram was neg,  f/u BMD on Femara showed osteopenia w/ TScores -1.4 to -2.2 (sl better in Spine, sl worse in hip)- continue present meds. ~  saw DrIngram 12/09- doing well... ~  saw DrLivesay 4/10 & Femara stopped... ~  f/u Mammogram 10/10 at [facility]= neg...  ~  saw Duwayne Heck 6/11- doing well, no changes made, f/u Prn. ~  f/u Mammogram 10/11 at [facility] showed benign findings, no worrisome abn, f/u 42yr. ~  [facility] is similar in appearance, stable, f/u 9yr...  DEGENERATIVE JOINT DISEASE, GENERALIZED (ICD-715.00) OSTEOPOROSIS (ICD-733.00) - she has osteopenia on scans from [facility]- on FOSAMAX 70mg /wk, +Caltrate, +Vits w/ VitD. ~  BMD 5/07 at [facility] w/ TScores -1.5 to -2.3.Marland KitchenMarland KitchenMarland Kitchen this was improved from 12/05 values... ~  labs 9/09 showed Vit D leve3l = 32... rec- OTC Vit D supplement. ~  BMD 10/09 at [facility] showed TScores -1.4 to -2.2 (sl better in Spine, sl worse in hip)- continue present meds. ~  f/u BMD due 10/11> done at [facility] w/ TScores -0.9 in Spine (16% improved), & -1.8 in left FemNeck (6% worse); continue Alendronate. ~  Labs 3/12 showed Vit D level = 39... rec OTC Vit D supplement ~2000u daily... ~  [facility] 1/14> UTI, mult issues, & FTT- to [facility] for PT etc => improved...  ANXIETY DISORDER, GENERALIZED (ICD-300.02) - on ALPRAZOLAM 0.25mg  Prn... her daughter's husb has Melanoma & it is very stressful to the family.  Health Maintenance - she gets the yearly FLU vaccine;  has PNEUMOVAX 9/09;  ?last Tetanus vaccine...   Past Surgical History  Procedure Laterality Date  . Cataract extraction    . Mastectomy  for breast cancer    Outpatient Encounter Prescriptions as of 11/08/2012  Medication Sig Dispense Refill  . acetaminophen (TYLENOL) 325 MG tablet Take 2  tablets (650 mg total) by mouth every 6 (six) hours as needed (or Fever >/= 101).      Marland Kitchen alendronate (FOSAMAX) 70 MG tablet Take 70 mg by mouth every 7 (seven) days. Take with a full glass of water on an empty stomach. Take on Wednesday      . calcium carbonate (CALCIUM 600) 600 MG TABS Take 600 mg by mouth 2 (two) times daily with a meal.      . carvedilol (COREG) 12.5 MG tablet Take 1 tablet (12.5 mg total) by mouth 2 (two) times daily with a meal.  180 tablet  3  . Cholecalciferol (VITAMIN D3) 1000 UNITS CAPS Take 2 capsules by mouth daily.      . cholestyramine light (PREVALITE) 4 G packet Take 1 packet (4 g total) by mouth daily.  30 packet  11  . feeding supplement (ENSURE) PUDG Take 1 Container by mouth 3 (three) times daily between meals.      . furosemide (LASIX) 40 MG tablet Take 40 mg by mouth daily.      Marland Kitchen lisinopril (PRINIVIL,ZESTRIL) 40 MG tablet Take 1 tablet (40 mg total) by mouth daily.  90 tablet  3  . loperamide (IMODIUM) 2 MG capsule Take 2 mg by mouth 2 (two) times daily. As needed for diarrhea      . metFORMIN (GLUCOPHAGE) 500 MG tablet Take 1 tablet (500 mg total) by mouth daily with breakfast.  90 tablet  3  . metolazone (ZAROXOLYN) 2.5 MG tablet Take one tablet by mouth on Monday and friday  8 tablet  2  . minoxidil (LONITEN) 10 MG tablet Take 1 tablet (10 mg total) by mouth daily.  30 tablet  11  . Multiple Vitamins-Minerals (CENTRUM SILVER ADULT 50+ PO) Take 1 tablet by mouth daily.      . potassium chloride SA (K-DUR,KLOR-CON) 20 MEQ tablet Take one tablet by mouth on Tuesday, Wednesday and thursday      . saccharomyces boulardii (FLORASTOR) 250 MG capsule Take 1 capsule (250 mg total) by mouth daily.  30 capsule  11  . silver sulfADIAZINE (SILVADENE) 1 % cream Use as directed  50 g  5  . simvastatin (ZOCOR) 20 MG tablet Take 1 tablet (20 mg total) by mouth at bedtime.  30 tablet  11   No facility-administered encounter medications on file as of 11/08/2012.     Allergies  Allergen Reactions  . Nitrofurantoin     REACTION: unsure of reaction--happened long ago  . Tramadol Nausea Only  . Codeine Nausea And Vomiting, Swelling and Rash    Current Medications, Allergies, Past Medical History, Past Surgical History, Family History, and Social History were reviewed in [employer] record.    Review of Systems         Constitutional:   No  weight loss, night sweats,  Fevers, chills,  +fatigue, or  lassitude.  HEENT:   No headaches,  Difficulty swallowing,  Tooth/dental problems, or  Sore throat,                No sneezing, itching, ear ache, nasal congestion, post nasal drip,   CV:  No chest pain,  Orthopnea, PND, swelling in lower extremities, anasarca, dizziness, palpitations, syncope.   GI  No heartburn, indigestion, abdominal pain, nausea, vomiting, diarrhea, change in bowel habits,  loss of appetite, bloody stools.   Resp: No shortness of breath with exertion or at rest.  No excess mucus, no productive cough,  No non-productive cough,  No coughing up of blood.  No change in color of mucus.  No wheezing.  No chest wall deformity  Skin: no rash or lesions.  GU: no dysuria, change in color of urine, no urgency or frequency.  No flank pain, no hematuria   MS:  No joint pain or swelling.  No decreased range of motion.  No back pain.  Psych:  No change in mood or affect. No depression or anxiety.  No memory loss.       Objective:   Physical Exam    WD, WN, 79 y/o WF in NAD but chr ill appearing... GENERAL:  Alert & oriented; pleasant & cooperative... HEENT:  [place]/AT, EOM-wnl, PERRLA, EACs-clear, TMs-wnl, NOSE-clear, THROAT-clear & wnl. Along outer eyes with 2 sutures on right and 3 on left , well approximated. No redness or drainage  NECK:  Supple w/ fairROM; no JVD; normal carotid impulses w/o bruits; no thyromegaly or nodules palpated; no lymphadenopathy. CHEST:  Clear to P & A; without wheezes/ rales/ or  rhonchi heard... HEART:  Regular Rhythm; without murmurs/ rubs/ or gallops detected... ABDOMEN:  Soft & nontender; mild panniculus; normal bowel sounds; no organomegaly or masses palpated... EXT: without deformities or arthritic changes; no varicose veins/ +venous insuffic/ 2+ edema. NEURO:  CN's intact;  no focal neuro deficits... DERM:  LE dressing in intact    Assessment & Plan:

## 2012-11-08 NOTE — Assessment & Plan Note (Addendum)
Facial lac , sutures removed without difficulty.   Use neosporin to areas until healed, watch for redness, drainage Please contact office for sooner follow up if symptoms do not improve or worsen or seek emergency care  Follow up Dr. Kriste Basque  As planned in 3-4 weeks and As needed

## 2012-11-08 NOTE — Patient Instructions (Addendum)
Use neosporin to areas until healed, watch for redness, drainage Please contact office for sooner follow up if symptoms do not improve or worsen or seek emergency care  Follow up Dr. Kriste Basque  As planned in 3-4 weeks and As needed

## 2012-11-12 ENCOUNTER — Ambulatory Visit: Payer: Medicare Other | Admitting: Pulmonary Disease

## 2012-11-21 ENCOUNTER — Other Ambulatory Visit: Payer: Self-pay | Admitting: Pulmonary Disease

## 2012-11-23 NOTE — Progress Notes (Signed)
Subjective:    Patient ID: Audrey Reed, female    DOB: 03/29/1918, 79 y.o.   MRN: [id number]  HPI 79 y/o WF here for a follow up visit... she has mult med problems as noted below...   ~  October 17, 2011:  6645mo ROV & [name] is remarkably stable at 5 and has no new complaints or concerns> BP is fairly well controlled on regimen & she is reminded to take then regularly every day;  Lipids have been at goal on Simva20 + diet;  DM control similarly adeq on Metformin monotherapy;  She continues to see her chiropractor regularly & takes Fosamax for her osteroporosis;  She is not fasting today & she requested to wait until her next f/u visit to repeat fasting blood work...  ~  April 22, 2012:  6645mo ROV & [name] feels well- no new complaints or concerns, but BP noted to be elev at 180/82 today, not checking BP at home, & claims she is taking her regimen regularly> Coreg12.5Bid, Labetolol200Bid, Lisinopril20, Lasix40> we decided to incr the Lisinopril to 40mg /d & plan short term f/u for further adjustments... She has a wound on left lat leg w/ escar & we discussed dressing changes & local care... Chol & DM are well regulated on meds below...    We reviewed prob list, meds, xrays and labs> see below >> OK Flu vaccine today... EKG 9/13 showed SBrady, rate47, RBBB, NSSTTWA... LABS 9/13:  FLP- at goals on Simva20;  Chems- ok on Metform500 w/ BS=149 A1c=5.9 Creat=1.2;  CBC- wnl;  TSH=1.54...  ~  June 03, 2012:  45mo ROV & follow up BP after adjustment w/ incr Lisin20=>40/d + continue other 3 meds the same;  She brought all med bottles today but there is no Labetolol & [facility] confirms not on this med but she is taking Coreg; BP today= 150/84 so we decided to try Minoxidil 10mg /d...  She will continue to monitor BP at home & f/u w/e in 2 months...    We reviewed prob list, meds, xrays and labs> see below for updates >>   ~  September 11, 2012:  72mo ROV & post hosp check> Hosp  by [facility] 12/29 - 08/15/12  W/ UTI, HBP, DM, Anxiety & FTT; she developed diarrhea on the antibiotics, CDiff neg; also had episode of asp pneum- prolonged her hosp stay & resolved w/ conservative rx; due to FTT- she agreed to palliative care consult & DNR written; she was sent to [facility] for rehab & is still there>> c/o some weeping from her legs, gas pains, wants to be disch from [facility]...    HBP, CAD, Ven Insuffic> on Coreg12.5Bid, Lisin40, Minoxidil10, Lasix40Bid; BP=110/60 & she denies CP/angina, palpit, ch in SOB/DOE; notes some edema & some "weeping"; Labs showed Creat up to 1.8 7 BNP improved to 145, therefore rec to decr Lasix40Qam...    Chol> on Simva20; last FLP was 9/13 w/ TChol 178, TG 71, HDL 80, LDL 84    DM> on Metform500Qam; Labs showed BS=125, A1c=6.0; good control, keep same...    GI> Divertics, hx polyp> followed by DrDBrodie for GI; currently taking Florastar, Immodium, Cholestyramine, Ensure pudding; diarrhea from recent hosp is improved...    Hx breast cancer> part mastectomy 2005 by DrIngram; followed by Duwayne Heck on Femara for 4-21yrs; last seen 6/11- doing well & f/u prn...    DJD, Osteopenia> on Fosamax, Calcium, MVI, VitD, Tylenol; last BMD was 10/11 at [facility] w/ TScore -  1.8 in Fem Neck; continue same meds...    Debilitated & FTT> she was disch from [place] to [place] for rehab...    Anxiety> off prev alprazolam, she is coping well she says... We reviewed prob list, meds, xrays and labs> see below for updates >> had Flu vaccine 9/13... LABS 2/14:  Chems- ok x BS=125 A1c=6.0 Creat=1.8 BNP=145   ~  October 01, 2012:  3wk ROV & [name] is now home from [employer] & getting home therapy etc; DrGates added Zaroxyln 2.5mg  on M&F & sent her home on this; recall that we decr her Lasix from 40Bid to just Qam last visit due to Creat up from 0.8 to 1.8 & BNP was down to 145; she has lost 12# over the last 3 wks- down to 153# now but still has 2-3+ edema in LEs; wound on right leg is improved, still  doing dressing changes and only min weeping reported,  BP= 152/84, chest is clear, and heart is regular...  We decided to recheck BMet 1st & decide on adjustments => K=5.5, BUN=24, Creat=1.3; therefore keep meds the same for now & ROV 82mo...  ~  October 23, 2012:  82mo ROV & unfortunately Ksenia has gained 7# up to 159# today; we reviewed low sodium, elevation, support hose, etc; she has remained on the Lasix40/d & the Zaroxyln2.5 on M&F; Labs today showed K=5.5, BUN=41, Creat=1.4, BNP=209... We decided to stop the KCl, keep others the same & recheck pt in 6wks...          Problem List:  HYPERTENSION (ICD-401.9) - controlled on COREG 12.5Bid + LISINOPRIL40, LASIX 40mg /d, off LABETOLOL...  ~  9/12:  BP= 130/72 & similar at home> feeling well & denies HA, fatigue, visual changes, CP, palipit, dizziness, syncope, dyspnea, etc... ~  3/13:  BP= 158/92 & she is reminded to take meds every day; she remains asymptomatic w/o CP, palpit, SOB, edema, etc... ~  9/13:  BP= 180/82 & she is asymptomatic; we decided to incr the LISINOPRIL to 40mg /d... ~  Hosp by [facility] 12/29 - 08/15/12  W/ UTI, HBP, DM, Anxiety & FTT; she developed diarrhea on the antibiotics- CDiff neg; also had episode of asp pneum- prolonged her hosp stay & resolved w/ conservative rx; due to FTT- she agreed to palliative care consult & DNR written; she was sent to [facility] for rehab & is still there>> c/o some weeping from her legs, gas pains, wants to be disch from [facility]... ~  09/11/12: on Coreg12.5Bid, Lisin40, Minoxidil10, Lasix40Bid; BP=110/60 & she denies CP/angina, palpit, ch in SOB/DOE; notes some edema & some "weeping"; Labs showed Creat up to 1.8 & BNP improved to 145, therefore rec to decr Lasix40Qam... ~  10/01/12: on Coreg12.5Bid, Lisin40, Minox10; Lasix40, Zaroxyln2.5-M&F (added by [facility] in [place]), K10-MWF; BP=152/84 & she feels better home from [facility] x1wk; Labs showed K=5.5 (on K20MWF), BUN=24, Creat=1.3; therefore keep meds the same for now &  ROV 82mo. ~  3/14:  on Coreg12.5Bid, Lisin40, Minox10; Lasix40, Zaroxyln2.5-M&F (added by DrGates in [place]), K10-MWF; BP= 110/50 & labs today showed K=5.5, BUN=41, Creat=1.4, BNP=209; Rec to stop the KCl & f/u ov in 6 weeks.  CORONARY ARTERY DISEASE (ICD-414.00) - on ASA 81mg /d... no CP, palpit, SOB, etc... exerc= yard, walking... ~  cath 2/91 by DrWeintraub showed 70% single vessel dis RCA & MVP... ~  In [place] 1/14> she developed transient AFib=> converted spont;  EKG w/ RBBB...  VENOUS INSUFFICIENCY (ICD-459.81) - she has mod Ven Insuffic w/ chr edema &  dry skin dermatitis> knows to follow a low sodium diet, elevate legs, wear support hose when able "[name]", & takes the LASIX daily. ~  2/14:  Zaroxyln 2.5-MWF added by DrGates in [place]; f/u labs showed K=5.5, BUN=24, Creat=1.3; therefore keep meds the same for now & ROV 63mo  HYPERCHOLESTEROLEMIA (ICD-272.0) - on SIMVASTATIN 20mg /d... tolerating well, +diet... ~  FLP 5/08 showed TChol 162, TG 82, HDL 63, LDL 83... ~  FLP 3/09 showed TChol 179, TG 67, HDL 73, LDL 93... rec- same med. ~  FLP 3/10 showed TChol 163, TG 71, HDL 72, LDL 77 ~  FLP 2/11 showed TChol 182, TG 91, HDL 88, LDL 76... Continue same. ~  FLP 3/12 showed TChol 167, TG 78, HDL 79, LDL 73 ~  3/13: she is not fasting for labs today & declines ret for FLP, prefers to wait til ROV in 82mo... ~  FLP 9/13 on Simva20 showed TChol 178, TG 71, HDL 80, LDL 84  DM (ICD-250.00) - on diet + METFORMIN 500mg /d added 3/09... ~  labs 5/08 showed BS=166, HgA1c=6.5 ~  labs 3/09 showed BS= 161 & Metformin 500mg /d started... ~  lab 9/09 showed BS= 111, HgA1c= 6.1.Marland KitchenMarland Kitchen rec- continue same. ~  labs 3/10 showed BS= 149, A1c= 6.2 ~  labs 9/10 showed BS= 112, A1c= 5.8 ~  labs 2/11 showed BS= 115, A1c= 6.4 ~  Labs 3/12 showed BS= 135, A1c= not done... ~  3/13: she is not fasting for labs today & declines ret for FLP, prefers to wait til ROV in 82mo... ~  Labs 9/13 on Metform500 showed BS=149,  A1c=5.9 ~  2/14: on Metform500Qam; Labs showed BS=125, A1c=6.0; good control, keep same...  DIVERTICULOSIS OF COLON (ICD-562.10) - last colonoscopy was 11/04 by DrDBrodie showing divertics & polyp (not retrieved)... she denies abd pain, D/ C/ blood etc...  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 surg by Lurline Hare, followed by Duwayne Heck and [facility] stopped 4/10 after 4.6yrs Rx... ~  saw Duwayne Heck 10/09- doing well, labs OK... f/u Mammogram was neg,  f/u BMD on Femara showed osteopenia w/ TScores -1.4 to -2.2 (sl better in Spine, sl worse in hip)- continue present meds. ~  saw DrIngram 12/09- doing well... ~  saw DrLivesay 4/10 & Femara stopped... ~  f/u Mammogram 10/10 at [facility]= neg...  ~  saw Duwayne Heck 6/11- doing well, no changes made, f/u Prn. ~  f/u Mammogram 10/11 at [facility] showed benign findings, no worrisome abn, f/u 45yr. ~  f/u Mammogram 10/12 at [facility] is similar in appearance, stable, f/u 47yr...  DEGENERATIVE JOINT DISEASE, GENERALIZED (ICD-715.00) OSTEOPOROSIS (ICD-733.00) - she has osteopenia on [facility]- on FOSAMAX 70mg /wk, +Caltrate, +Vits w/ VitD. ~  BMD 5/07 at [facility] w/ TScores -1.5 to -2.3.Marland KitchenMarland KitchenMarland Kitchen this was improved from 12/05 values... ~  labs 9/09 showed Vit D leve3l = 32... rec- OTC Vit D supplement. ~  BMD 10/09 at [facility] showed TScores -1.4 to -2.2 (sl better in Spine, sl worse in hip)- continue present meds. ~  f/u BMD due 10/11> done at [facility] w/ TScores -0.9 in Spine (16% improved), & -1.8 in left FemNeck (6% worse); continue Alendronate. ~  Labs 3/12 showed Vit D level = 39... rec OTC Vit D supplement ~2000u daily... ~  [facility] 1/14> UTI, mult issues, & FTT- to [facility] for PT etc => improved...  ANXIETY DISORDER, GENERALIZED (ICD-300.02) - on ALPRAZOLAM 0.25mg  Prn... her daughter's husb has Melanoma &  it is very stressful to the family.  Health Maintenance - she gets the yearly FLU  vaccine;  has PNEUMOVAX 9/09;  ?last Tetanus vaccine...   Past Surgical History  Procedure Laterality Date  . Cataract extraction    . Mastectomy      for breast cancer    Outpatient Encounter Prescriptions as of 10/23/2012  Medication Sig Dispense Refill  . acetaminophen (TYLENOL) 325 MG tablet Take 2 tablets (650 mg total) by mouth every 6 (six) hours as needed (or Fever >/= 101).      Marland Kitchen alendronate (FOSAMAX) 70 MG tablet Take 70 mg by mouth every 7 (seven) days. Take with a full glass of water on an empty stomach. Take on Wednesday      . calcium carbonate (CALCIUM 600) 600 MG TABS Take 600 mg by mouth 2 (two) times daily with a meal.      . Cholecalciferol (VITAMIN D3) 1000 UNITS CAPS Take 2 capsules by mouth daily.      . feeding supplement (ENSURE) PUDG Take 1 Container by mouth 3 (three) times daily between meals.      . furosemide (LASIX) 40 MG tablet Take 40 mg by mouth daily.      Marland Kitchen loperamide (IMODIUM) 2 MG capsule Take 2 mg by mouth 2 (two) times daily. As needed for diarrhea      . Multiple Vitamins-Minerals (CENTRUM SILVER ADULT 50+ PO) Take 1 tablet by mouth daily.      . potassium chloride SA (K-DUR,KLOR-CON) 20 MEQ tablet Take one tablet by mouth on Tuesday, Wednesday and thursday      . [DISCONTINUED] carvedilol (COREG) 12.5 MG tablet Take 1 tablet (12.5 mg total) by mouth 2 (two) times daily with a meal.  180 tablet  3  . [DISCONTINUED] lisinopril (PRINIVIL,ZESTRIL) 40 MG tablet Take 1 tablet (40 mg total) by mouth daily.  90 tablet  3  . [DISCONTINUED] metFORMIN (GLUCOPHAGE) 500 MG tablet Take 1 tablet (500 mg total) by mouth daily with breakfast.  90 tablet  3  . [DISCONTINUED] silver sulfADIAZINE (SILVADENE) 1 % cream Use as directed  50 g  1  . [DISCONTINUED] cholestyramine light (PREVALITE) 4 G packet Take 1 packet (4 g total) by mouth daily.      . [DISCONTINUED] metolazone (ZAROXOLYN) 2.5 MG tablet Take one tablet by mouth on Monday and friday      . [DISCONTINUED]  minoxidil (LONITEN) 10 MG tablet Take 10 mg by mouth daily.      . [DISCONTINUED] saccharomyces boulardii (FLORASTOR) 250 MG capsule Take 250 mg by mouth daily.      . [DISCONTINUED] simvastatin (ZOCOR) 20 MG tablet Take 20 mg by mouth at bedtime.       No facility-administered encounter medications on file as of 10/23/2012.    Allergies  Allergen Reactions  . Nitrofurantoin     REACTION: unsure of reaction--happened long ago  . Tramadol Nausea Only  . Codeine Nausea And Vomiting, Swelling and Rash    Current Medications, Allergies, Past Medical History, Past Surgical History, Family History, and Social History were reviewed in [employer] record.    Review of Systems        See HPI - all other systems neg except as noted...  The patient complains of dyspnea on exertion and difficulty walking.  The patient denies anorexia, fever, weight loss, weight gain, vision loss, decreased hearing, hoarseness, chest pain, syncope, peripheral edema, prolonged cough, headaches, hemoptysis, abdominal pain, melena, hematochezia, severe  indigestion/heartburn, hematuria, incontinence, muscle weakness, suspicious skin lesions, transient blindness, depression, unusual weight change, abnormal bleeding, enlarged lymph nodes, and angioedema.     Objective:   Physical Exam    WD, WN, 79 y/o WF in NAD but chr ill appearing... GENERAL:  Alert & oriented; pleasant & cooperative... HEENT:  [place]/AT, EOM-wnl, PERRLA, EACs-clear, TMs-wnl, NOSE-clear, THROAT-clear & wnl. NECK:  Supple w/ fairROM; no JVD; normal carotid impulses w/o bruits; no thyromegaly or nodules palpated; no lymphadenopathy. CHEST:  Clear to P & A; without wheezes/ rales/ or rhonchi heard... HEART:  Regular Rhythm; without murmurs/ rubs/ or gallops detected... ABDOMEN:  Soft & nontender; mild panniculus; normal bowel sounds; no organomegaly or masses palpated... EXT: without deformities or arthritic changes; no varicose  veins/ +venous insuffic/ 3+ edema. NEURO:  CN's intact;  no focal neuro deficits... DERM:  Dry skin dermatitis on LEs; right lower leg is wrapped...  RADIOLOGY DATA:  Reviewed in the EPIC EMR & discussed w/ the patient...  LABORATORY DATA:  Reviewed in the EPIC EMR & discussed w/ the patient...   Assessment & Plan:    HBP>  On 4-5 med regimen adjusted during her 1/14 Hosp; BP is good & she is feeling better; K=5.5, BUN=41, Creat=1.4, BNP=209; therefore keep meds the same for now x stop the KCl...  CAD>  On ASA daily, asymptomatic & encouraged to exercise regularly w/ her rehab program...  Ven Insuffic>  She has VI, chr edema & dry skin dermatitis; she continues dressing changes; DrGates added Zaroxyln 2.72M&F at the [facility] & sent her home on this; we had decreased her Lasix from 40Bid to 40Qam & will keep it there for now...  CHOL>  On Simva20 + diet;  Continue same...  DM>  On Metformin 500mg /d + diet;  Continue same...  GI> hx Divertics & Polyp>  She is improving w/ resolution of diarrhea- rec to wean Probiotic & Immodium...  Hx Breast Cancer>  Doing satis w/o recurrent disease...  DJD, Osteoporosis>  Doing satis on the Fosamax etc...  Anxiety>  Off Alprazolam now & hasn't needed...   Patient's Medications  New Prescriptions   No medications on file  Previous Medications   ACETAMINOPHEN (TYLENOL) 325 MG TABLET    Take 2 tablets (650 mg total) by mouth every 6 (six) hours as needed (or Fever >/= 101).   ALENDRONATE (FOSAMAX) 70 MG TABLET    Take 70 mg by mouth every 7 (seven) days. Take with a full glass of water on an empty stomach. Take on Wednesday   CALCIUM CARBONATE (CALCIUM 600) 600 MG TABS    Take 600 mg by mouth 2 (two) times daily with a meal.   CHOLECALCIFEROL (VITAMIN D3) 1000 UNITS CAPS    Take 2 capsules by mouth daily.   FEEDING SUPPLEMENT (ENSURE) PUDG    Take 1 Container by mouth 3 (three) times daily between meals.   FUROSEMIDE (LASIX) 40 MG TABLET    Take 40 mg  by mouth daily.   LOPERAMIDE (IMODIUM) 2 MG CAPSULE    Take 2 mg by mouth 2 (two) times daily. As needed for diarrhea   MULTIPLE VITAMINS-MINERALS (CENTRUM SILVER ADULT 50+ PO)    Take 1 tablet by mouth daily.   POTASSIUM CHLORIDE SA (K-DUR,KLOR-CON) 20 MEQ TABLET    Take one tablet by mouth on Tuesday, Wednesday and thursday  Modified Medications   Modified Medication Previous Medication   CARVEDILOL (COREG) 12.5 MG TABLET carvedilol (COREG) 12.5 MG tablet  Take 1 tablet (12.5 mg total) by mouth 2 (two) times daily with a meal.    Take 1 tablet (12.5 mg total) by mouth 2 (two) times daily with a meal.   CHOLESTYRAMINE LIGHT (PREVALITE) 4 G PACKET cholestyramine light (PREVALITE) 4 G packet      Take 1 packet (4 g total) by mouth daily.    Take 1 packet (4 g total) by mouth daily.   LISINOPRIL (PRINIVIL,ZESTRIL) 40 MG TABLET lisinopril (PRINIVIL,ZESTRIL) 40 MG tablet      Take 1 tablet (40 mg total) by mouth daily.    Take 1 tablet (40 mg total) by mouth daily.   METFORMIN (GLUCOPHAGE) 500 MG TABLET metFORMIN (GLUCOPHAGE) 500 MG tablet      Take 1 tablet (500 mg total) by mouth daily with breakfast.    Take 1 tablet (500 mg total) by mouth daily with breakfast.   METOLAZONE (ZAROXOLYN) 2.5 MG TABLET metolazone (ZAROXOLYN) 2.5 MG tablet      Take one tablet by mouth on Monday and friday    Take one tablet by mouth on Monday and friday   MINOXIDIL (LONITEN) 10 MG TABLET minoxidil (LONITEN) 10 MG tablet      Take 1 tablet (10 mg total) by mouth daily.    Take 10 mg by mouth daily.   SACCHAROMYCES BOULARDII (FLORASTOR) 250 MG CAPSULE saccharomyces boulardii (FLORASTOR) 250 MG capsule      Take 1 capsule (250 mg total) by mouth daily.    Take 250 mg by mouth daily.   SILVER SULFADIAZINE (SILVADENE) 1 % CREAM silver sulfADIAZINE (SILVADENE) 1 % cream      Use as directed    Use as directed   SIMVASTATIN (ZOCOR) 20 MG TABLET simvastatin (ZOCOR) 20 MG tablet      Take 1 tablet (20 mg total) by  mouth at bedtime.    Take 20 mg by mouth at bedtime.  Discontinued Medications   No medications on file

## 2012-12-05 ENCOUNTER — Ambulatory Visit: Payer: Medicare Other | Admitting: Pulmonary Disease

## 2012-12-16 ENCOUNTER — Other Ambulatory Visit (INDEPENDENT_AMBULATORY_CARE_PROVIDER_SITE_OTHER): Payer: Medicare Other

## 2012-12-16 ENCOUNTER — Encounter: Payer: Self-pay | Admitting: Pulmonary Disease

## 2012-12-16 ENCOUNTER — Ambulatory Visit (INDEPENDENT_AMBULATORY_CARE_PROVIDER_SITE_OTHER): Payer: Medicare Other | Admitting: Pulmonary Disease

## 2012-12-16 VITALS — BP 134/52 | HR 48 | Temp 96.9°F | Ht 61.0 in | Wt 145.0 lb

## 2012-12-16 DIAGNOSIS — R627 Adult failure to thrive: Secondary | ICD-10-CM

## 2012-12-16 DIAGNOSIS — I872 Venous insufficiency (chronic) (peripheral): Secondary | ICD-10-CM

## 2012-12-16 DIAGNOSIS — E7800 Pure hypercholesterolemia, unspecified: Secondary | ICD-10-CM

## 2012-12-16 DIAGNOSIS — I10 Essential (primary) hypertension: Secondary | ICD-10-CM

## 2012-12-16 DIAGNOSIS — R609 Edema, unspecified: Secondary | ICD-10-CM | POA: Diagnosis not present

## 2012-12-16 DIAGNOSIS — E119 Type 2 diabetes mellitus without complications: Secondary | ICD-10-CM

## 2012-12-16 DIAGNOSIS — M159 Polyosteoarthritis, unspecified: Secondary | ICD-10-CM

## 2012-12-16 DIAGNOSIS — M545 Low back pain, unspecified: Secondary | ICD-10-CM

## 2012-12-16 DIAGNOSIS — I2510 Atherosclerotic heart disease of native coronary artery without angina pectoris: Secondary | ICD-10-CM

## 2012-12-16 LAB — BASIC METABOLIC PANEL
BUN: 59 mg/dL — ABNORMAL HIGH (ref 6–23)
Calcium: 12.1 mg/dL — ABNORMAL HIGH (ref 8.4–10.5)
Chloride: 95 mEq/L — ABNORMAL LOW (ref 96–112)
Creatinine, Ser: 1.7 mg/dL — ABNORMAL HIGH (ref 0.4–1.2)

## 2012-12-16 NOTE — Patient Instructions (Addendum)
Today we updated your med list in our EPIC system...    Continue your current medications the same...  Today we rechecked your blood work...    We will contact you w/ the results when available...     We can then make any necessary adjustments in your meds...  Call for any questions...  Let's plan a follow up visit in 8mo, sooner if needed for problems.Marland KitchenMarland Kitchen

## 2012-12-16 NOTE — Progress Notes (Signed)
Subjective:    Patient ID: Audrey Reed, female    DOB: August 07, 1918, 79 y.o.   MRN: [id number]  HPI 79 y/o WF here for a follow up visit... she has mult med problems as noted below...   ~  October 17, 2011:  445mo ROV & Audrey Reed is remarkably stable at 68 and has no new complaints or concerns> BP is fairly well controlled on regimen & she is reminded to take then regularly every day;  Lipids have been at goal on Simva20 + diet;  DM control similarly adeq on Metformin monotherapy;  She continues to see her chiropractor regularly & takes Fosamax for her osteroporosis;  She is not fasting today & she requested to wait until her next f/u visit to repeat fasting blood work...  ~  April 22, 2012:  445mo ROV & Audrey Reed feels well- no new complaints or concerns, but BP noted to be elev at 180/82 today, not checking BP at home, & claims she is taking her regimen regularly> Coreg12.5Bid, Labetolol200Bid, Lisinopril20, Lasix40> we decided to incr the Lisinopril to 40mg /d & plan short term f/u for further adjustments... She has a wound on left lat leg w/ escar & we discussed dressing changes & local care... Chol & DM are well regulated on meds below...    We reviewed prob list, meds, xrays and labs> see below >> OK Flu vaccine today... EKG 9/13 showed SBrady, rate47, RBBB, NSSTTWA... LABS 9/13:  FLP- at goals on Simva20;  Chems- ok on Metform500 w/ BS=149 A1c=5.9 Creat=1.2;  CBC- wnl;  TSH=1.54...  ~  June 03, 2012:  28mo ROV & follow up BP after adjustment w/ incr Lisin20=>40/d + continue other 3 meds the same;  She brought all med bottles today but there is no Labetolol & [facility] confirms not on this med but she is taking Coreg; BP today= 150/84 so we decided to try Minoxidil 10mg /d...  She will continue to monitor BP at home & f/u w/e in 2 months...    We reviewed prob list, meds, xrays and labs> see below for updates >>   ~  September 11, 2012:  56mo ROV & post hosp check> Hosp  by [facility] 12/29 - 08/15/12  W/ UTI, HBP, DM, Anxiety & FTT; she developed diarrhea on the antibiotics, CDiff neg; also had episode of asp pneum- prolonged her hosp stay & resolved w/ conservative rx; due to FTT- she agreed to palliative care consult & DNR written; she was sent to [facility] for rehab & is still there>> c/o some weeping from her legs, gas pains, wants to be disch from [facility]...    HBP, CAD, Ven Insuffic> on Coreg12.5Bid, Lisin40, Minoxidil10, Lasix40Bid; BP=110/60 & she denies CP/angina, palpit, ch in SOB/DOE; notes some edema & some "weeping"; Labs showed Creat up to 1.8 7 BNP improved to 145, therefore rec to decr Lasix40Qam...    Chol> on Simva20; last FLP was 9/13 w/ TChol 178, TG 71, HDL 80, LDL 84    DM> on Metform500Qam; Labs showed BS=125, A1c=6.0; good control, keep same...    GI> Divertics, hx polyp> followed by DrDBrodie for GI; currently taking Florastar, Immodium, Cholestyramine, Ensure pudding; diarrhea from recent hosp is improved...    Hx breast cancer> part mastectomy 2005 by DrIngram; followed by Duwayne Heck on Femara for 4-67yrs; last seen 6/11- doing well & f/u prn...    DJD, Osteopenia> on Fosamax, Calcium, MVI, VitD, Tylenol; last BMD was 10/11 at [facility] w/ TScore -  1.8 in Fem Neck; continue same meds...    Debilitated & FTT> she was disch from [place] to [place] for rehab...    Anxiety> off prev alprazolam, she is coping well she says... We reviewed prob list, meds, xrays and labs> see below for updates >> had Flu vaccine 9/13... LABS 2/14:  Chems- ok x BS=125 A1c=6.0 Creat=1.8 BNP=145   ~  October 01, 2012:  3wk ROV & Audrey Reed is now home from [employer] & getting home therapy etc; DrGates added Zaroxyln 2.5mg  on M&F & sent her home on this; recall that we decr her Lasix from 40Bid to just Qam last visit due to Creat up from 0.8 to 1.8 & BNP was down to 145; she has lost 12# over the last 3 wks- down to 153# now but still has 2-3+ edema in LEs; wound on right leg is improved, still  doing dressing changes and only min weeping reported,  BP= 152/84, chest is clear, and heart is regular...  We decided to recheck BMet 1st & decide on adjustments => K=5.5, BUN=24, Creat=1.3; therefore keep meds the same for now & ROV 664mo...  ~  October 23, 2012:  664mo ROV & unfortunately Audrey Reed has gained 7# up to 159# today; we reviewed low sodium, elevation, support hose, etc; she has remained on the Lasix40/d & the Zaroxyln2.5 on M&F; Labs today showed K=5.5, BUN=41, Creat=1.4, BNP=209... We decided to stop the KCl, keep others the same & recheck pt in 6wks...  ~  Dec 16, 2012:  64mo ROV & Audrey Reed notes that her legs are much better w/ decr edema and loss of 14# from last visit; she has noted worsening urinary incont w/ the diuretics & is wearing depends; we rechecked her BMet & found> BUN=59, Creat=1.7, Ca=12.1;  Rec to STOP ZAROXYLN & CALCIUM supplement, CHANGE LASIX40 to 2Qam... Plan f/u BMet via lab in 1 mo...     HBP, CAD, Ven Insuffic, Edema> on Coreg12.5Bid, Lisin40, Minoxidil10, Lasix40Bid & Zaroxyln 2.5onM&F; BP=134/52 & she denies CP/angina, palpit, ch in SOB/DOE; notes edema improved; labs today as above & rec to stop Zaroxyln & ch Lasix to 80Qam...    DM> on Metform500Qam; Labs showed BS=113; last A1c=6.0 (2/14); good control, keep same...    Hx breast cancer> part mastectomy 2005 by DrIngram; followed by Duwayne Heck on Femara for 4-73yrs; last seen 6/11- doing well & f/u prn...    Hypercalcemia>  New problem w/ Ca=12.1 on today's lab; prev 8.1-10.4 and she is on Calcium supplement, Fosamax70, VitD1000; she has hx breast ca but no known recurrence & no bone pain reported; advised to stop Calcium & we are adjusting Lasix, recheck 664mo... We reviewed prob list, meds, xrays and labs> see below for updates >> She did not bring med bottles or med list to the OV today... LABS 5/14:  Chems-  BS=113, BUN=59, Creat=1.7, Ca=12.1.Marland KitchenMarland Kitchen            Problem List:  HYPERTENSION (ICD-401.9) >>  ~  9/12:  BP= 130/72  & similar at home> feeling well & denies HA, fatigue, visual changes, CP, palipit, dizziness, syncope, dyspnea, etc... ~  3/13:  BP= 158/92 & she is reminded to take meds every day; she remains asymptomatic w/o CP, palpit, SOB, edema, etc... ~  9/13:  BP= 180/82 & she is asymptomatic; we decided to incr the LISINOPRIL to 40mg /d... ~  Hosp by [facility] 12/29 - 08/15/12  W/ UTI, HBP, DM, Anxiety & FTT; she developed diarrhea on the antibiotics- CDiff  neg; also had episode of asp pneum- prolonged her hosp stay & resolved w/ conservative rx; due to FTT- she agreed to palliative care consult & DNR written; she was sent to [facility] for rehab & is still there>> c/o some weeping from her legs, gas pains, wants to be disch from [facility]... ~  09/11/12: on Coreg12.5Bid, Lisin40, Minoxidil10, Lasix40Bid; BP=110/60 & she denies CP/angina, palpit, ch in SOB/DOE; notes some edema & some "weeping"; Labs showed Creat up to 1.8 & BNP improved to 145, therefore rec to decr Lasix40Qam... ~  10/01/12: on Coreg12.5Bid, Lisin40, Minox10; Lasix40, Zaroxyln2.5-M&F (added by [facility] [place]), K10-MWF; BP=152/84 & she feels better home from [facility] x1wk; Labs showed K=5.5 (on K20MWF), BUN=24, Creat=1.3; therefore keep meds the same for now & ROV 38mo. ~  3/14:  on Coreg12.5Bid, Lisin40, Minox10; Lasix40, Zaroxyln2.5-M&F (added by DrGates in [place]), K10-MWF; BP= 110/50 & labs today showed K=5.5, BUN=41, Creat=1.4, BNP=209; Rec to stop the KCl & f/u ov in 6 weeks. ~  5/14: on Coreg12.5Bid, Lisin40, Minoxidil10, Lasix40Bid & Zaroxyln 2.5onM&F; BP=134/52 & she denies CP/angina, palpit, ch in SOB/DOE; notes edema improved; labs today as above & rec to stop Zaroxyln & ch Lasix to 80Qam...  HYPERCALCEMIA >> NEW PROB 5/14 w/ routine labs showing Ca=12.1 (prev 8.1-10.4); we decided to STOP calcium supplement, adjusted her Lasix, & recheck lab in one month...  CORONARY ARTERY DISEASE (ICD-414.00) - on ASA 81mg /d... no CP, palpit, SOB, etc... exerc= yard,  walking... ~  cath 2/91 by DrWeintraub showed 70% single vessel dis RCA & MVP... ~  In [place] 1/14> she developed transient AFib=> converted spont;  EKG w/ RBBB...  VENOUS INSUFFICIENCY (ICD-459.81) - she has mod Ven Insuffic w/ chr edema & dry skin dermatitis> knows to follow a low sodium diet, elevate legs, wear support hose when able "[name]", & takes the LASIX daily. ~  2/14:  Zaroxyln 2.5-MWF added by DrGates in [place]; f/u labs showed K=5.5, BUN=24, Creat=1.3; therefore keep meds the same for now & ROV 38mo  HYPERCHOLESTEROLEMIA (ICD-272.0) - on SIMVASTATIN 20mg /d... tolerating well, +diet... ~  FLP 5/08 showed TChol 162, TG 82, HDL 63, LDL 83... ~  FLP 3/09 showed TChol 179, TG 67, HDL 73, LDL 93... rec- same med. ~  FLP 3/10 showed TChol 163, TG 71, HDL 72, LDL 77 ~  FLP 2/11 showed TChol 182, TG 91, HDL 88, LDL 76... Continue same. ~  FLP 3/12 showed TChol 167, TG 78, HDL 79, LDL 73 ~  3/13: she is not fasting for labs today & declines ret for FLP, prefers to wait til ROV in 88mo... ~  FLP 9/13 on Simva20 showed TChol 178, TG 71, HDL 80, LDL 84  DM (ICD-250.00) - on diet + METFORMIN 500mg /d added 3/09... ~  labs 5/08 showed BS=166, HgA1c=6.5 ~  labs 3/09 showed BS= 161 & Metformin 500mg /d started... ~  lab 9/09 showed BS= 111, HgA1c= 6.1.Marland KitchenMarland Kitchen rec- continue same. ~  labs 3/10 showed BS= 149, A1c= 6.2 ~  labs 9/10 showed BS= 112, A1c= 5.8 ~  labs 2/11 showed BS= 115, A1c= 6.4 ~  Labs 3/12 showed BS= 135, A1c= not done... ~  3/13: she is not fasting for labs today & declines ret for FLP, prefers to wait til ROV in 88mo... ~  Labs 9/13 on Metform500 showed BS=149, A1c=5.9 ~  2/14: on Metform500Qam; Labs showed BS=125, A1c=6.0; good control, keep same... ~  5/14:  on Metform500Qam; Labs showed BS=113; last A1c=6.0 (2/14);  good control, keep same.  DIVERTICULOSIS OF COLON (ICD-562.10) - last colonoscopy was 11/04 by DrDBrodie showing divertics & polyp (not retrieved)... she denies abd  pain, D/ C/ blood etc...  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 surg by Lurline Hare, followed by Duwayne Heck and [facility] stopped 4/10 after 4.66yrs Rx... ~  saw Duwayne Heck 10/09- doing well, labs OK... f/u Mammogram was neg,  f/u BMD on Femara showed osteopenia w/ TScores -1.4 to -2.2 (sl better in Spine, sl worse in hip)- continue present meds. ~  saw DrIngram 12/09- doing well... ~  saw DrLivesay 4/10 & Femara stopped... ~  f/u Mammogram 10/10 at [facility]= neg...  ~  saw Duwayne Heck 6/11- doing well, no changes made, f/u Prn. ~  f/u Mammogram 10/11 at [facility] showed benign findings, no worrisome abn, f/u 61yr. ~  f/u Mammogram 10/12 at [facility] is similar in appearance, stable, f/u 70yr...  DEGENERATIVE JOINT DISEASE, GENERALIZED (ICD-715.00) OSTEOPOROSIS (ICD-733.00) - she has osteopenia on scans [facility]- on FOSAMAX 70mg /wk, +Caltrate, +Vits w/ VitD. ~  BMD 5/07 at [facility] w/ TScores -1.5 to -2.3.Marland KitchenMarland KitchenMarland Kitchen this was improved from 12/05 values... ~  labs 9/09 showed Vit D leve3l = 32... rec- OTC Vit D supplement. ~  BMD 10/09 at [facility] showed TScores -1.4 to -2.2 (sl better in Spine, sl worse in hip)- continue present meds. ~  f/u BMD due 10/11> done at [facility] w/ TScores -0.9 in Spine (16% improved), & -1.8 in left FemNeck (6% worse); continue Alendronate. ~  Labs 3/12 showed Vit D level = 39... rec OTC Vit D supplement ~2000u daily... ~  [facility] 1/14> UTI, mult issues, & FTT- to [facility] for PT etc => improved...  ANXIETY DISORDER, GENERALIZED (ICD-300.02) - on ALPRAZOLAM 0.25mg  Prn... her daughter's husb has Melanoma & it is very stressful to the family.  Health Maintenance - she gets the yearly FLU vaccine;  has PNEUMOVAX 9/09;  ?last Tetanus vaccine...   Past Surgical History  Procedure Laterality Date  . Cataract extraction    . Mastectomy      for breast cancer    Outpatient Encounter Prescriptions as of 12/16/2012   Medication Sig Dispense Refill  . acetaminophen (TYLENOL) 325 MG tablet Take 2 tablets (650 mg total) by mouth every 6 (six) hours as needed (or Fever >/= 101).      Marland Kitchen alendronate (FOSAMAX) 70 MG tablet Take 70 mg by mouth every 7 (seven) days. Take with a full glass of water on an empty stomach. Take on Wednesday      . calcium carbonate (CALCIUM 600) 600 MG TABS Take 600 mg by mouth 2 (two) times daily with a meal.      . carvedilol (COREG) 12.5 MG tablet Take 1 tablet (12.5 mg total) by mouth 2 (two) times daily with a meal.  180 tablet  3  . Cholecalciferol (VITAMIN D3) 1000 UNITS CAPS Take 2 capsules by mouth daily.      . cholestyramine light (PREVALITE) 4 G packet Take 1 packet (4 g total) by mouth daily.  30 packet  11  . feeding supplement (ENSURE) PUDG Take 1 Container by mouth 3 (three) times daily between meals.      . furosemide (LASIX) 40 MG tablet take 1 tablet by mouth twice a day  60 tablet  6  . lisinopril (PRINIVIL,ZESTRIL) 40 MG tablet Take 1 tablet (40 mg total) by mouth daily.  90 tablet  3  . loperamide (IMODIUM)  2 MG capsule Take 2 mg by mouth 2 (two) times daily. As needed for diarrhea      . metFORMIN (GLUCOPHAGE) 500 MG tablet Take 1 tablet (500 mg total) by mouth daily with breakfast.  90 tablet  3  . metolazone (ZAROXOLYN) 2.5 MG tablet Take one tablet by mouth on Monday and friday  8 tablet  2  . minoxidil (LONITEN) 10 MG tablet Take 1 tablet (10 mg total) by mouth daily.  30 tablet  11  . Multiple Vitamins-Minerals (CENTRUM SILVER ADULT 50+ PO) Take 1 tablet by mouth daily.      Marland Kitchen saccharomyces boulardii (FLORASTOR) 250 MG capsule Take 1 capsule (250 mg total) by mouth daily.  30 capsule  11  . silver sulfADIAZINE (SILVADENE) 1 % cream Use as directed  50 g  5  . simvastatin (ZOCOR) 20 MG tablet Take 1 tablet (20 mg total) by mouth at bedtime.  30 tablet  11  . [DISCONTINUED] potassium chloride SA (K-DUR,KLOR-CON) 20 MEQ tablet Take one tablet by mouth on Tuesday,  Wednesday and thursday       No facility-administered encounter medications on file as of 12/16/2012.    Allergies  Allergen Reactions  . Nitrofurantoin     REACTION: unsure of reaction--happened long ago  . Tramadol Nausea Only  . Codeine Nausea And Vomiting, Swelling and Rash    Current Medications, Allergies, Past Medical History, Past Surgical History, Family History, and Social History were reviewed in [employer] record.    Review of Systems        See HPI - all other systems neg except as noted...  The patient complains of dyspnea on exertion and difficulty walking.  The patient denies anorexia, fever, weight loss, weight gain, vision loss, decreased hearing, hoarseness, chest pain, syncope, peripheral edema, prolonged cough, headaches, hemoptysis, abdominal pain, melena, hematochezia, severe indigestion/heartburn, hematuria, incontinence, muscle weakness, suspicious skin lesions, transient blindness, depression, unusual weight change, abnormal bleeding, enlarged lymph nodes, and angioedema.     Objective:   Physical Exam    WD, WN, 79 y/o WF in NAD but chr ill appearing... GENERAL:  Alert & oriented; pleasant & cooperative... HEENT:  [place]/AT, EOM-wnl, PERRLA, EACs-clear, TMs-wnl, NOSE-clear, THROAT-clear & wnl. NECK:  Supple w/ fairROM; no JVD; normal carotid impulses w/o bruits; no thyromegaly or nodules palpated; no lymphadenopathy. CHEST:  Clear to P & A; without wheezes/ rales/ or rhonchi heard... HEART:  Regular Rhythm; without murmurs/ rubs/ or gallops detected... ABDOMEN:  Soft & nontender; mild panniculus; normal bowel sounds; no organomegaly or masses palpated... EXT: without deformities or arthritic changes; no varicose veins/ +venous insuffic/ 3+ edema. NEURO:  CN's intact;  no focal neuro deficits... DERM:  Dry skin dermatitis on LEs; right lower leg is wrapped...  RADIOLOGY DATA:  Reviewed in the EPIC EMR & discussed w/ the  patient...  LABORATORY DATA:  Reviewed in the EPIC EMR & discussed w/ the patient...   Assessment & Plan:    HBP, Renal Insuffic>  On med regimen adjusted during her 1/14 [facility] & several times since then; today we are stopping the Zaroxyln & changing Lasix to 80mg  Qam...  HYPERCALCEMIA>  NEW PROB 5/14 w/ routine labs showing Ca=12.1 (prev 8.1-10.4); we decided to STOP calcium supplement, adjusted her Lasix, & recheck lab in one month...   CAD>  On ASA daily, asymptomatic & encouraged to exercise regularly w/ her rehab program...  Ven Insuffic>  She has VI, chr edema & dry skin dermatitis;  we have adjusted her diuretics several times- today we will stop the Zaroxyln & ch Lasix to 80mg Qam...  CHOL>  On Simva20 + diet;  Continue same...  DM>  On Metformin 500mg /d + diet;  Continue same...  GI> hx Divertics & Polyp>  She is improving w/ resolution of diarrhea- rec to wean Probiotic & Immodium...  Hx Breast Cancer>  Doing satis w/o recurrent disease, but elev calcium is of concern- we will make adjustments & repeat lab, consider bone scan of Ca remains elev...  DJD, Osteoporosis>  Doing satis on the Fosamax etc...  Anxiety>  Off Alprazolam now & hasn't needed...   Patient's Medications  New Prescriptions   No medications on file  Previous Medications   ACETAMINOPHEN (TYLENOL) 325 MG TABLET    Take 2 tablets (650 mg total) by mouth every 6 (six) hours as needed (or Fever >/= 101).   ALENDRONATE (FOSAMAX) 70 MG TABLET    Take 70 mg by mouth every 7 (seven) days. Take with a full glass of water on an empty stomach. Take on Wednesday   CALCIUM CARBONATE (CALCIUM 600) 600 MG TABS      STOP CALCIUM SUPPLEMENTS...   CARVEDILOL (COREG) 12.5 MG TABLET    Take 1 tablet (12.5 mg total) by mouth 2 (two) times daily with a meal.   CHOLECALCIFEROL (VITAMIN D3) 1000 UNITS CAPS    Take 2 capsules by mouth daily.   CHOLESTYRAMINE LIGHT (PREVALITE) 4 G PACKET    Take 1 packet (4 g total) by mouth  daily.   FEEDING SUPPLEMENT (ENSURE) PUDG    Take 1 Container by mouth 3 (three) times daily between meals.   FUROSEMIDE (LASIX) 40 MG TABLET       TAKE 2 TABS Q AM...   LISINOPRIL (PRINIVIL,ZESTRIL) 40 MG TABLET    Take 1 tablet (40 mg total) by mouth daily.   LOPERAMIDE (IMODIUM) 2 MG CAPSULE    Take 2 mg by mouth 2 (two) times daily. As needed for diarrhea   METFORMIN (GLUCOPHAGE) 500 MG TABLET    Take 1 tablet (500 mg total) by mouth daily with breakfast.   METOLAZONE (ZAROXOLYN) 2.5 MG TABLET       STOP the ZAROXYLN...   MINOXIDIL (LONITEN) 10 MG TABLET    Take 1 tablet (10 mg total) by mouth daily.   MULTIPLE VITAMINS-MINERALS (CENTRUM SILVER ADULT 50+ PO)    Take 1 tablet by mouth daily.   SACCHAROMYCES BOULARDII (FLORASTOR) 250 MG CAPSULE    Take 1 capsule (250 mg total) by mouth daily.   SILVER SULFADIAZINE (SILVADENE) 1 % CREAM    Use as directed   SIMVASTATIN (ZOCOR) 20 MG TABLET    Take 1 tablet (20 mg total) by mouth at bedtime.  Modified Medications   No medications on file  Discontinued Medications   POTASSIUM CHLORIDE SA (K-DUR,KLOR-CON) 20 MEQ TABLET    Take one tablet by mouth on Tuesday, Wednesday and thursday

## 2012-12-18 ENCOUNTER — Other Ambulatory Visit: Payer: Self-pay | Admitting: Pulmonary Disease

## 2012-12-18 MED ORDER — FUROSEMIDE 80 MG PO TABS: 80.0000 mg | ORAL_TABLET | Freq: Every day | ORAL | Status: DC

## 2012-12-19 ENCOUNTER — Telehealth: Payer: Self-pay | Admitting: Pulmonary Disease

## 2012-12-19 NOTE — Telephone Encounter (Signed)
Spoke with pt's son and verified Dr Jodelle Green instructions for med changes to D/C Zaroxylyn and change to Lasix 80 mg QAM and also d/c any calcium supplements.

## 2012-12-23 ENCOUNTER — Telehealth: Payer: Self-pay | Admitting: Pulmonary Disease

## 2012-12-23 NOTE — Telephone Encounter (Signed)
I spoke with jerry. He states that every morning x the past 1-2 weeks after pt takes her meds in the Am she will feel dizzy, have blurred vision, sick to stomach and the will have diarrhea. He stated this morning she was slummped over in the chair and she finally came to but stated her vision was blurry. Per [name] he thinks this has to do something with her medications and wants her to come in and see SN. TP has no available openings. Please advise thanks Last OV 12/16/12 w/ SN Pending 03/18/13

## 2012-12-23 NOTE — Telephone Encounter (Signed)
Per SN---   Pt had been on the lasix 40 mg  1 po bid.  SN advised jerry to take the 2 tabs in the morning to get a better benefit out of the lasix since pt had all the swelling.  i called and spoke with jerry and he is aware that SN said if he feels that this is coming from taking the larger dose of lasix all in the morning and they feel this is causing her side effects to change back to lasix 40 mg  1 po bid.  [name] will do this until her appt on Thursday with SN.  Nothing further is needed.

## 2012-12-23 NOTE — Telephone Encounter (Signed)
Per SN--work in appt   Called and spoke with jerry and he stated that this started after SN increased the pts lasix from 40 mg daily to 80 mg daily.  He is not sure if this is the cause of her problems.  [name] is aware of appt scheduled for the pt on 5/22 at 10.  SN please advise if you feel the lasix should be decreased for the pt.  Thanks

## 2012-12-26 ENCOUNTER — Other Ambulatory Visit (INDEPENDENT_AMBULATORY_CARE_PROVIDER_SITE_OTHER): Payer: Medicare Other

## 2012-12-26 ENCOUNTER — Encounter: Payer: Self-pay | Admitting: Pulmonary Disease

## 2012-12-26 ENCOUNTER — Ambulatory Visit (INDEPENDENT_AMBULATORY_CARE_PROVIDER_SITE_OTHER): Payer: Medicare Other | Admitting: Pulmonary Disease

## 2012-12-26 VITALS — BP 94/60 | HR 60 | Temp 96.9°F | Ht 61.0 in | Wt 143.6 lb

## 2012-12-26 DIAGNOSIS — I10 Essential (primary) hypertension: Secondary | ICD-10-CM | POA: Diagnosis not present

## 2012-12-26 DIAGNOSIS — N390 Urinary tract infection, site not specified: Secondary | ICD-10-CM | POA: Diagnosis not present

## 2012-12-26 DIAGNOSIS — R609 Edema, unspecified: Secondary | ICD-10-CM | POA: Diagnosis not present

## 2012-12-26 DIAGNOSIS — I872 Venous insufficiency (chronic) (peripheral): Secondary | ICD-10-CM

## 2012-12-26 DIAGNOSIS — N289 Disorder of kidney and ureter, unspecified: Secondary | ICD-10-CM

## 2012-12-26 DIAGNOSIS — R627 Adult failure to thrive: Secondary | ICD-10-CM

## 2012-12-26 DIAGNOSIS — E119 Type 2 diabetes mellitus without complications: Secondary | ICD-10-CM

## 2012-12-26 LAB — BASIC METABOLIC PANEL
CO2: 29 mEq/L (ref 19–32)
Calcium: 9.9 mg/dL (ref 8.4–10.5)
Creatinine, Ser: 1.7 mg/dL — ABNORMAL HIGH (ref 0.4–1.2)
Glucose, Bld: 119 mg/dL — ABNORMAL HIGH (ref 70–99)
Sodium: 133 mEq/L — ABNORMAL LOW (ref 135–145)

## 2012-12-26 LAB — URINALYSIS, ROUTINE W REFLEX MICROSCOPIC
Nitrite: NEGATIVE
RBC / HPF: NONE SEEN (ref 0–?)
Specific Gravity, Urine: 1.015 (ref 1.000–1.030)
Urine Glucose: NEGATIVE
Urobilinogen, UA: 0.2 (ref 0.0–1.0)

## 2012-12-26 NOTE — Patient Instructions (Addendum)
Today we updated your med list in our EPIC system...    Continue your current medications the same...  Today we rechecked your metabolic labs and checked your urine for an infection...    We will contact you w/ the results when available...   If we call w/ changes to your medications>>    Be sure to make those changes on your MEDICATION list...    And call if you have any questions...  Be sure to eliminate the salt/ sodium from your diet...    And wrap your legs w/ an ACE bandage when up & about during the day...  Let's plan a follow up to be sure you are doing OK in about 7month

## 2012-12-27 LAB — URINE CULTURE

## 2012-12-28 ENCOUNTER — Encounter: Payer: Self-pay | Admitting: Pulmonary Disease

## 2012-12-28 NOTE — Progress Notes (Signed)
Subjective:    Patient ID: Audrey Reed, female    DOB: May 31, 1918, 79 y.o.   MRN: [id number]  HPI 79 y/o WF here for a follow up visit... she has mult med problems as noted below...   ~  April 22, 2012:  74mo ROV & Audrey Reed feels well- no new complaints or concerns, but BP noted to be elev at 180/82 today, not checking BP at home, & claims she is taking her regimen regularly> Coreg12.5Bid, Labetolol200Bid, Lisinopril20, Lasix40> we decided to incr the Lisinopril to 40mg /d & plan short term f/u for further adjustments... She has a wound on left lat leg w/ escar & we discussed dressing changes & local care... Chol & DM are well regulated on meds below...    We reviewed prob list, meds, xrays and labs> see below >> OK Flu vaccine today... EKG 9/13 showed SBrady, rate47, RBBB, NSSTTWA... LABS 9/13:  FLP- at goals on Simva20;  Chems- ok on Metform500 w/ BS=149 A1c=5.9 Creat=1.2;  CBC- wnl;  TSH=1.54...  ~  June 03, 2012:  50mo ROV & follow up BP after adjustment w/ incr Lisin20=>40/d + continue other 3 meds the same;  She brought all med bottles today but there is no Labetolol & [facility] confirms not on this med but she is taking Coreg; BP today= 150/84 so we decided to try Minoxidil 10mg /d...  She will continue to monitor BP at home & f/u w/e in 2 months...    We reviewed prob list, meds, xrays and labs> see below for updates >>   ~  September 11, 2012:  43mo ROV & post hosp check> [facility] 12/29 - 08/15/12  W/ UTI, HBP, DM, Anxiety & FTT; she developed diarrhea on the antibiotics, CDiff neg; also had episode of asp pneum- prolonged her hosp stay & resolved w/ conservative rx; due to FTT- she agreed to palliative care consult & DNR written; she was sent to [facility] for rehab & is still there>> c/o some weeping from her legs, gas pains, wants to be disch from [facility]...    HBP, CAD, Ven Insuffic> on Coreg12.5Bid, Lisin40, Minoxidil10, Lasix40Bid; BP=110/60 & she denies  CP/angina, palpit, ch in SOB/DOE; notes some edema & some "weeping"; Labs showed Creat up to 1.8 7 BNP improved to 145, therefore rec to decr Lasix40Qam...    Chol> on Simva20; last FLP was 9/13 w/ TChol 178, TG 71, HDL 80, LDL 84    DM> on Metform500Qam; Labs showed BS=125, A1c=6.0; good control, keep same...    GI> Divertics, hx polyp> followed by DrDBrodie for GI; currently taking Florastar, Immodium, Cholestyramine, Ensure pudding; diarrhea from recent hosp is improved...    Hx breast cancer> part mastectomy 2005 by DrIngram; followed by Duwayne Heck on Femara for 4-39yrs; last seen 6/11- doing well & f/u prn...    DJD, Osteopenia> on Fosamax, Calcium, MVI, VitD, Tylenol; last BMD was 10/11 at [facility] w/ TScore -1.8 in Fem Neck; continue same meds...    Debilitated & FTT> she was disch from [place] to [place] for rehab...    Anxiety> off prev alprazolam, she is coping well she says... We reviewed prob list, meds, xrays and labs> see below for updates >> had Flu vaccine 9/13... LABS 2/14:  Chems- ok x BS=125 A1c=6.0 Creat=1.8 BNP=145   ~  October 01, 2012:  3wk ROV & Audrey Reed is now home from [employer] & getting home therapy etc; DrGates added Zaroxyln 2.5mg  on M&F & sent her  home on this; recall that we decr her Lasix from 40Bid to just Qam last visit due to Creat up from 0.8 to 1.8 & BNP was down to 145; she has lost 12# over the last 3 wks- down to 153# now but still has 2-3+ edema in LEs; wound on right leg is improved, still doing dressing changes and only min weeping reported,  BP= 152/84, chest is clear, and heart is regular...  We decided to recheck BMet 1st & decide on adjustments => K=5.5, BUN=24, Creat=1.3; therefore keep meds the same for now & ROV 566mo...  ~  October 23, 2012:  566mo ROV & unfortunately Audrey Reed has gained 7# up to 159# today; we reviewed low sodium, elevation, support hose, etc; she has remained on the Lasix40/d & the Zaroxyln2.5 on M&F; Labs today showed K=5.5, BUN=41, Creat=1.4,  BNP=209... We decided to stop the KCl, keep others the same & recheck pt in 6wks...  ~  Dec 16, 2012:  34mo ROV & Audrey Reed notes that her legs are much better w/ decr edema and loss of 14# from last visit; she has noted worsening urinary incont w/ the diuretics & is wearing depends; we rechecked her BMet & found> BUN=59, Creat=1.7, Ca=12.1;  Rec to STOP ZAROXYLN & CALCIUM supplement, CHANGE LASIX40 to 2Qam... Plan f/u BMet via lab in 1 mo...     HBP, CAD, Ven Insuffic, Edema> on Coreg12.5Bid, Lisin40, Minoxidil10, Lasix40Bid & Zaroxyln 2.5onM&F; BP=134/52 & she denies CP/angina, palpit, ch in SOB/DOE; notes edema improved; labs today as above & rec to stop Zaroxyln & ch Lasix to 80Qam...    DM> on Metform500Qam; Labs showed BS=113; last A1c=6.0 (2/14); good control, keep same...    Hx breast cancer> part mastectomy 2005 by DrIngram; followed by Duwayne Heck on Femara for 4-64yrs; last seen 6/11- doing well & f/u prn...    Hypercalcemia>  New problem w/ Ca=12.1 on today's lab; prev 8.1-10.4 and she is on Calcium supplement, Fosamax70, VitD1000; she has hx breast ca but no known recurrence & no bone pain reported; advised to stop Calcium & we are adjusting Lasix, recheck 566mo... We reviewed prob list, meds, xrays and labs> see below for updates >> She did not bring med bottles or med list to the OV today... LABS 5/14:  Chems-  BS=113, BUN=59, Creat=1.7, Ca=12.1...   ~  Dec 26, 2012:  10d ROV & Audrey Reed has made a medication error> last OV on Lasix40Bid & Zaroxyln2.65M&F and had 14# wt loss w/ decrease in edema; labs showed BUN=59, Creat=1.7, Ca=12.1; we recommended stopping calcium supplement, stop Zaroxyln, and continue the Lasix but change it to 80mg Qam; she misunderstood & added the Lasix80Qam to her prev Lasix40Bid!  She notes incr urination & sl dizzy, BP today is 94/60, & exam shows tr-1+edema w/ wt down 1-2# further; f/u labs revealed BUN=67, Cr=1.7 (incidentally her Ca=9.9 and K=4.3)... She is now instructed to  STOP the Bid Lasix & just take the Lasix80mg  Qam (HER SON IS ASKED TO SUPERVISE HER MEDS)...           Problem List:  HYPERTENSION (ICD-401.9) >>  ~  9/12:  BP= 130/72 & similar at home> feeling well & denies HA, fatigue, visual changes, CP, palipit, dizziness, syncope, dyspnea, etc... ~  3/13:  BP= 158/92 & she is reminded to take meds every day; she remains asymptomatic w/o CP, palpit, SOB, edema, etc... ~  9/13:  BP= 180/82 & she is asymptomatic; we decided to incr the LISINOPRIL to 40mg /d... ~  [facility] 12/29 - 08/15/12  W/ UTI, HBP, DM, Anxiety & FTT; she developed diarrhea on the antibiotics- CDiff neg; also had episode of asp pneum- prolonged her hosp stay & resolved w/ conservative rx; due to FTT- she agreed to palliative care consult & DNR written; she was sent to [facility] for rehab & is still there>> c/o some weeping from her legs, gas pains, wants to be disch from [facility]... ~  09/11/12: on Coreg12.5Bid, Lisin40, Minoxidil10, Lasix40Bid; BP=110/60 & she denies CP/angina, palpit, ch in SOB/DOE; notes some edema & some "weeping"; Labs showed Creat up to 1.8 & BNP improved to 145, therefore rec to decr Lasix40Qam... ~  10/01/12: on Coreg12.5Bid, Lisin40, Minox10; Lasix40, Zaroxyln2.5-M&F (added by [facility] in [place]), K10-MWF; BP=152/84 & she feels better home from [facility] x1wk; Labs showed K=5.5 (on K20MWF), BUN=24, Creat=1.3; therefore keep meds the same for now & ROV 57mo. ~  3/14:  on Coreg12.5Bid, Lisin40, Minox10; Lasix40, Zaroxyln2.5-M&F (added by DrGates in [place]), K10-MWF; BP= 110/50 & labs today showed K=5.5, BUN=41, Creat=1.4, BNP=209; Rec to stop the KCl & f/u ov in 6 weeks. ~  5/14: on Coreg12.5Bid, Lisin40, Minoxidil10, Lasix40Bid & Zaroxyln 2.5onM&F; BP=134/52 & she denies CP/angina, palpit, ch in SOB/DOE; notes edema improved; labs today as above & rec to stop Zaroxyln & ch Lasix to 80Qam...  HYPERCALCEMIA >> NEW PROB 5/14 w/ routine labs showing Ca=12.1 (prev 8.1-10.4); we decided to STOP  calcium supplement, adjusted her Lasix, & recheck lab in one month=> Ca=9.9  CORONARY ARTERY DISEASE (ICD-414.00) - on ASA 81mg /d... no CP, palpit, SOB, etc... exerc= yard, walking... ~  cath 2/91 by DrWeintraub showed 70% single vessel dis RCA & MVP... ~  In [place] 1/14> she developed transient AFib=> converted spont;  EKG w/ RBBB...  VENOUS INSUFFICIENCY (ICD-459.81) - she has mod Ven Insuffic w/ chr edema & dry skin dermatitis> knows to follow a low sodium diet, elevate legs, wear support hose when able "[name]", & takes the LASIX daily. ~  2/14:  Zaroxyln 2.5-MWF added by DrGates in [place]; f/u labs showed K=5.5, BUN=24, Creat=1.3; therefore keep meds the same for now & ROV 57mo  HYPERCHOLESTEROLEMIA (ICD-272.0) - on SIMVASTATIN 20mg /d... tolerating well, +diet... ~  FLP 5/08 showed TChol 162, TG 82, HDL 63, LDL 83... ~  FLP 3/09 showed TChol 179, TG 67, HDL 73, LDL 93... rec- same med. ~  FLP 3/10 showed TChol 163, TG 71, HDL 72, LDL 77 ~  FLP 2/11 showed TChol 182, TG 91, HDL 88, LDL 76... Continue same. ~  FLP 3/12 showed TChol 167, TG 78, HDL 79, LDL 73 ~  3/13: she is not fasting for labs today & declines ret for FLP, prefers to wait til ROV in 57mo... ~  FLP 9/13 on Simva20 showed TChol 178, TG 71, HDL 80, LDL 84  DM (ICD-250.00) - on diet + METFORMIN 500mg /d added 3/09... ~  labs 5/08 showed BS=166, HgA1c=6.5 ~  labs 3/09 showed BS= 161 & Metformin 500mg /d started... ~  lab 9/09 showed BS= 111, HgA1c= 6.1.Marland KitchenMarland Kitchen rec- continue same. ~  labs 3/10 showed BS= 149, A1c= 6.2 ~  labs 9/10 showed BS= 112, A1c= 5.8 ~  labs 2/11 showed BS= 115, A1c= 6.4 ~  Labs 3/12 showed BS= 135, A1c= not done... ~  3/13: she is not fasting for labs today & declines ret for FLP, prefers to wait til ROV in 57mo... ~  Labs 9/13 on Metform500 showed BS=149, A1c=5.9 ~  2/14:  on Metform500Qam; Labs showed BS=125, A1c=6.0; good control, keep same... ~  5/14:  on Metform500Qam; Labs showed BS=113; last A1c=6.0  (2/14); good control, keep same.  DIVERTICULOSIS OF COLON (ICD-562.10) - last colonoscopy was 11/04 by DrDBrodie showing divertics & polyp (not retrieved)... she denies abd pain, D/ C/ blood etc...  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 surg by Lurline Hare, followed by Duwayne Heck and [facility] stopped 4/10 after 4.89yrs Rx... ~  saw Duwayne Heck 10/09- doing well, labs OK... f/u Mammogram was neg,  f/u BMD on Femara showed osteopenia w/ TScores -1.4 to -2.2 (sl better in Spine, sl worse in hip)- continue present meds. ~  saw DrIngram 12/09- doing well... ~  saw DrLivesay 4/10 & Femara stopped... ~  f/u Mammogram 10/10 at [facility]= neg...  ~  saw Duwayne Heck 6/11- doing well, no changes made, f/u Prn. ~  f/u Mammogram 10/11 at [facility] showed benign findings, no worrisome abn, f/u 59yr. ~  f/u Mammogram 10/12 at [facility] is similar in appearance, stable, f/u 46yr...  DEGENERATIVE JOINT DISEASE, GENERALIZED (ICD-715.00) OSTEOPOROSIS (ICD-733.00) - she has osteopenia on [facility]- on FOSAMAX 70mg /wk, +Caltrate, +Vits w/ VitD. ~  BMD 5/07 at [facility] w/ TScores -1.5 to -2.3.Marland KitchenMarland KitchenMarland Kitchen this was improved from 12/05 values... ~  labs 9/09 showed Vit D leve3l = 32... rec- OTC Vit D supplement. ~  BMD 10/09 at [facility] showed TScores -1.4 to -2.2 (sl better in Spine, sl worse in hip)- continue present meds. ~  f/u BMD due 10/11> done at [facility] w/ TScores -0.9 in Spine (16% improved), & -1.8 in left FemNeck (6% worse); continue Alendronate. ~  Labs 3/12 showed Vit D level = 39... rec OTC Vit D supplement ~2000u daily... ~  [facility] 1/14> UTI, mult issues, & FTT- to [facility] for PT etc => improved...  ANXIETY DISORDER, GENERALIZED (ICD-300.02) - on ALPRAZOLAM 0.25mg  Prn... her daughter's husb has Melanoma & it is very stressful to the family.  Health Maintenance - she gets the yearly FLU vaccine;  has PNEUMOVAX 9/09;  ?last Tetanus  vaccine...   Past Surgical History  Procedure Laterality Date  . Cataract extraction    . Mastectomy      for breast cancer    Outpatient Encounter Prescriptions as of 12/26/2012  Medication Sig Dispense Refill  . acetaminophen (TYLENOL) 325 MG tablet Take 2 tablets (650 mg total) by mouth every 6 (six) hours as needed (or Fever >/= 101).      Marland Kitchen alendronate (FOSAMAX) 70 MG tablet Take 70 mg by mouth every 7 (seven) days. Take with a full glass of water on an empty stomach. Take on Wednesday      . carvedilol (COREG) 12.5 MG tablet Take 1 tablet (12.5 mg total) by mouth 2 (two) times daily with a meal.  180 tablet  3  . Cholecalciferol (VITAMIN D3) 1000 UNITS CAPS Take 2 capsules by mouth daily.      . feeding supplement (ENSURE) PUDG Take 1 Container by mouth 3 (three) times daily between meals.      . furosemide (LASIX) 40 MG tablet Take 2 tablets by mouth every morning      . lisinopril (PRINIVIL,ZESTRIL) 40 MG tablet Take 1 tablet (40 mg total) by mouth daily.  90 tablet  3  . loperamide (IMODIUM) 2 MG capsule Take 2 mg by mouth 2 (two) times daily. As needed for diarrhea      . metFORMIN (GLUCOPHAGE) 500 MG tablet  Take 1 tablet (500 mg total) by mouth daily with breakfast.  90 tablet  3  . minoxidil (LONITEN) 10 MG tablet Take 1 tablet (10 mg total) by mouth daily.  30 tablet  11  . Multiple Vitamins-Minerals (CENTRUM SILVER ADULT 50+ PO) Take 1 tablet by mouth daily.      Marland Kitchen saccharomyces boulardii (FLORASTOR) 250 MG capsule Take 1 capsule (250 mg total) by mouth daily.  30 capsule  11  . silver sulfADIAZINE (SILVADENE) 1 % cream Use as directed  50 g  5  . simvastatin (ZOCOR) 20 MG tablet Take 1 tablet (20 mg total) by mouth at bedtime.  30 tablet  11  . [DISCONTINUED] furosemide (LASIX) 80 MG tablet Take 1/2 tablet by mouth two times daily      . [DISCONTINUED] calcium carbonate (CALCIUM 600) 600 MG TABS Take 600 mg by mouth 2 (two) times daily with a meal.      . [DISCONTINUED]  cholestyramine light (PREVALITE) 4 G packet Take 1 packet (4 g total) by mouth daily.  30 packet  11   No facility-administered encounter medications on file as of 12/26/2012.    Allergies  Allergen Reactions  . Nitrofurantoin     REACTION: unsure of reaction--happened long ago  . Tramadol Nausea Only  . Codeine Nausea And Vomiting, Swelling and Rash    Current Medications, Allergies, Past Medical History, Past Surgical History, Family History, and Social History were reviewed in [employer] record.    Review of Systems        See HPI - all other systems neg except as noted...  The patient complains of dyspnea on exertion and difficulty walking.  The patient denies anorexia, fever, weight loss, weight gain, vision loss, decreased hearing, hoarseness, chest pain, syncope, peripheral edema, prolonged cough, headaches, hemoptysis, abdominal pain, melena, hematochezia, severe indigestion/heartburn, hematuria, incontinence, muscle weakness, suspicious skin lesions, transient blindness, depression, unusual weight change, abnormal bleeding, enlarged lymph nodes, and angioedema.     Objective:   Physical Exam    WD, WN, 79 y/o WF in NAD but chr ill appearing... GENERAL:  Alert & oriented; pleasant & cooperative... HEENT:  [place]/AT, EOM-wnl, PERRLA, EACs-clear, TMs-wnl, NOSE-clear, THROAT-clear & wnl. NECK:  Supple w/ fairROM; no JVD; normal carotid impulses w/o bruits; no thyromegaly or nodules palpated; no lymphadenopathy. CHEST:  Clear to P & A; without wheezes/ rales/ or rhonchi heard... HEART:  Regular Rhythm; without murmurs/ rubs/ or gallops detected... ABDOMEN:  Soft & nontender; mild panniculus; normal bowel sounds; no organomegaly or masses palpated... EXT: without deformities or arthritic changes; no varicose veins/ +venous insuffic/ 3+ edema. NEURO:  CN's intact;  no focal neuro deficits... DERM:  Dry skin dermatitis on LEs; right lower leg is  wrapped...  RADIOLOGY DATA:  Reviewed in the EPIC EMR & discussed w/ the patient...  LABORATORY DATA:  Reviewed in the EPIC EMR & discussed w/ the patient...   Assessment & Plan:    HBP, Renal Insuffic>  On med regimen adjusted during her 1/14 [facility] & several times since then; We have stopped the Zaroxyln & changed Lasix to 80mg  Qam...  HYPERCALCEMIA>  NEW PROB 5/14 w/ routine labs showing Ca=12.1 (prev 8.1-10.4); we decided to STOP calcium supplement, adjusted her Lasix, & recheck lab => Ca=9.9   CAD>  On ASA daily, asymptomatic & encouraged to exercise regularly w/ her rehab program...  Ven Insuffic>  She has VI, chr edema & dry skin dermatitis; we have adjusted her diuretics  several times- today we will stop the Zaroxyln & ch Lasix to 80mg Qam...  CHOL>  On Simva20 + diet;  Continue same...  DM>  On Metformin 500mg /d + diet;  Continue same...  GI> hx Divertics & Polyp>  She is improving w/ resolution of diarrhea- rec to wean Probiotic & Immodium...  Hx Breast Cancer>  Doing satis w/o recurrent disease, but elev calcium is of concern- we will make adjustments & repeat lab, consider bone scan of Ca remains elev...  DJD, Osteoporosis>  Doing satis on the Fosamax etc...  Anxiety>  Off Alprazolam now & hasn't needed...   Patient's Medications  New Prescriptions   No medications on file  Previous Medications   ACETAMINOPHEN (TYLENOL) 325 MG TABLET    Take 2 tablets (650 mg total) by mouth every 6 (six) hours as needed (or Fever >/= 101).   ALENDRONATE (FOSAMAX) 70 MG TABLET    Take 70 mg by mouth every 7 (seven) days. Take with a full glass of water on an empty stomach. Take on Wednesday   CALCIUM CARBONATE (CALCIUM 600) 600 MG TABS      STOP CALCIUM SUPPLEMENTS...   CARVEDILOL (COREG) 12.5 MG TABLET    Take 1 tablet (12.5 mg total) by mouth 2 (two) times daily with a meal.   CHOLECALCIFEROL (VITAMIN D3) 1000 UNITS CAPS    Take 2 capsules by mouth daily.   CHOLESTYRAMINE LIGHT  (PREVALITE) 4 G PACKET    Take 1 packet (4 g total) by mouth daily.   FEEDING SUPPLEMENT (ENSURE) PUDG    Take 1 Container by mouth 3 (three) times daily between meals.   FUROSEMIDE (LASIX) 40 MG TABLET       TAKE 2 TABS Q AM...   LISINOPRIL (PRINIVIL,ZESTRIL) 40 MG TABLET    Take 1 tablet (40 mg total) by mouth daily.   LOPERAMIDE (IMODIUM) 2 MG CAPSULE    Take 2 mg by mouth 2 (two) times daily. As needed for diarrhea   METFORMIN (GLUCOPHAGE) 500 MG TABLET    Take 1 tablet (500 mg total) by mouth daily with breakfast.   METOLAZONE (ZAROXOLYN) 2.5 MG TABLET       STOP the ZAROXYLN...   MINOXIDIL (LONITEN) 10 MG TABLET    Take 1 tablet (10 mg total) by mouth daily.   MULTIPLE VITAMINS-MINERALS (CENTRUM SILVER ADULT 50+ PO)    Take 1 tablet by mouth daily.   SACCHAROMYCES BOULARDII (FLORASTOR) 250 MG CAPSULE    Take 1 capsule (250 mg total) by mouth daily.   SILVER SULFADIAZINE (SILVADENE) 1 % CREAM    Use as directed   SIMVASTATIN (ZOCOR) 20 MG TABLET    Take 1 tablet (20 mg total) by mouth at bedtime.  Modified Medications   No medications on file  Discontinued Medications   POTASSIUM CHLORIDE SA (K-DUR,KLOR-CON) 20 MEQ TABLET    Take one tablet by mouth on Tuesday, Wednesday and thursday

## 2013-01-01 ENCOUNTER — Telehealth: Payer: Self-pay | Admitting: Pulmonary Disease

## 2013-01-01 NOTE — Telephone Encounter (Signed)
Called and spoke with Audrey Reed and he is aware of lab results per SN for the pt.  [name] will call back if any further questions or concerns.  Nothing further is needed.

## 2013-01-06 DIAGNOSIS — M79609 Pain in unspecified limb: Secondary | ICD-10-CM | POA: Diagnosis not present

## 2013-01-06 DIAGNOSIS — B351 Tinea unguium: Secondary | ICD-10-CM | POA: Diagnosis not present

## 2013-01-07 DIAGNOSIS — R221 Localized swelling, mass and lump, neck: Secondary | ICD-10-CM | POA: Diagnosis not present

## 2013-01-07 DIAGNOSIS — M7710 Lateral epicondylitis, unspecified elbow: Secondary | ICD-10-CM | POA: Diagnosis not present

## 2013-01-07 DIAGNOSIS — R220 Localized swelling, mass and lump, head: Secondary | ICD-10-CM | POA: Diagnosis not present

## 2013-02-04 ENCOUNTER — Encounter: Payer: Self-pay | Admitting: Pulmonary Disease

## 2013-02-04 ENCOUNTER — Ambulatory Visit (INDEPENDENT_AMBULATORY_CARE_PROVIDER_SITE_OTHER): Payer: Medicare Other | Admitting: Pulmonary Disease

## 2013-02-04 VITALS — BP 150/58 | HR 49 | Temp 97.5°F | Ht 61.0 in | Wt 140.4 lb

## 2013-02-04 DIAGNOSIS — I10 Essential (primary) hypertension: Secondary | ICD-10-CM | POA: Diagnosis not present

## 2013-02-04 DIAGNOSIS — N289 Disorder of kidney and ureter, unspecified: Secondary | ICD-10-CM

## 2013-02-04 DIAGNOSIS — I872 Venous insufficiency (chronic) (peripheral): Secondary | ICD-10-CM | POA: Diagnosis not present

## 2013-02-04 DIAGNOSIS — R609 Edema, unspecified: Secondary | ICD-10-CM

## 2013-02-04 DIAGNOSIS — I2510 Atherosclerotic heart disease of native coronary artery without angina pectoris: Secondary | ICD-10-CM

## 2013-02-04 DIAGNOSIS — E119 Type 2 diabetes mellitus without complications: Secondary | ICD-10-CM

## 2013-02-09 ENCOUNTER — Encounter: Payer: Self-pay | Admitting: Pulmonary Disease

## 2013-02-09 NOTE — Progress Notes (Signed)
Subjective:    Patient ID: Audrey Reed, female    DOB: [birth year], 79 y.o.   MRN: [id number]  HPI 79 y/o WF here for a follow up visit... she has mult med problems as noted below...   ~  September 11, 2012:  77mo ROV & post hosp check> Hosp by [facility] 12/29 - 08/15/12 w/ UTI, HBP, DM, Anxiety & FTT; she developed diarrhea on the antibiotics, CDiff neg; also had episode of asp pneum- prolonged her hosp stay & resolved w/ conservative rx; due to FTT- she agreed to palliative care consult & DNR written; she was sent to [facility] for rehab & is still there>> c/o some weeping from her legs, gas pains, wants to be disch from [facility]...    HBP, CAD, Ven Insuffic> on Coreg12.5Bid, Lisin40, Minoxidil10, Lasix40Bid; BP=110/60 & she denies CP/angina, palpit, ch in SOB/DOE; notes some edema & some "weeping"; Labs showed Creat up to 1.8 7 BNP improved to 145, therefore rec to decr Lasix40Qam...    Chol> on Simva20; last FLP was 9/13 w/ TChol 178, TG 71, HDL 80, LDL 84    DM> on Metform500Qam; Labs showed BS=125, A1c=6.0; good control, keep same...    GI> Divertics, hx polyp> followed by DrDBrodie for GI; currently taking Florastar, Immodium, Cholestyramine, Ensure pudding; diarrhea from recent hosp is improved...    Hx breast cancer> part mastectomy 2005 by DrIngram; followed by Duwayne Heck on Femara for 4-51yrs; last seen 6/11- doing well & f/u prn...    DJD, Osteopenia> on Fosamax, Calcium, MVI, VitD, Tylenol; last BMD was 10/11 at [facility] w/ TScore -1.8 in Fem Neck; continue same meds...    Debilitated & FTT> she was disch from [facility] to [facility] for rehab...    Anxiety> off prev alprazolam, she is coping well she says... We reviewed prob list, meds, xrays and labs> see below for updates >> had Flu vaccine 9/13... LABS 2/14:  Chems- ok x BS=125 A1c=6.0 Creat=1.8 BNP=145   ~  October 01, 2012:  3wk ROV & Sky is now home from [employer] & getting home therapy etc; DrGates added Zaroxyln 2.5mg  on M&F & sent her home on this;  recall that we decr her Lasix from 40Bid to just Qam last visit due to Creat up from 0.8 to 1.8 & BNP was down to 145; she has lost 12# over the last 3 wks- down to 153# now but still has 2-3+ edema in LEs; wound on right leg is improved, still doing dressing changes and only min weeping reported,  BP= 152/84, chest is clear, and heart is regular...  We decided to recheck BMet 1st & decide on adjustments => K=5.5, BUN=24, Creat=1.3; therefore keep meds the same for now & ROV 28mo...  ~  October 23, 2012:  28mo ROV & unfortunately Audrey Reed has gained 7# up to 159# today; we reviewed low sodium, elevation, support hose, etc; she has remained on the Lasix40/d & the Zaroxyln2.5 on M&F; Labs today showed K=5.5, BUN=41, Creat=1.4, BNP=209... We decided to stop the KCl, keep others the same & recheck pt in 6wks...  ~  Dec 16, 2012:  6mo ROV & Audrey Reed notes that her legs are much better w/ decr edema and loss of 14# from last visit; she has noted worsening urinary incont w/ the diuretics & is wearing depends; we rechecked her BMet & found> BUN=59, Creat=1.7, Ca=12.1;  Rec to STOP ZAROXYLN & CALCIUM supplement, CHANGE LASIX40 to 2Qam... Plan f/u BMet via lab in 1 mo.Marland KitchenMarland Kitchen  HBP, CAD, Ven Insuffic, Edema> on Coreg12.5Bid, Lisin40, Minoxidil10, Lasix40Bid & Zaroxyln 2.5onM&F; BP=134/52 & she denies CP/angina, palpit, ch in SOB/DOE; notes edema improved; labs today as above & rec to stop Zaroxyln & ch Lasix to 80Qam...    DM> on Metform500Qam; Labs showed BS=113; last A1c=6.0 (2/14); good control, keep same...    Hx breast cancer> part mastectomy 2005 by DrIngram; followed by Duwayne Heck on Femara for 4-36yrs; last seen 6/11- doing well & f/u prn...    Hypercalcemia>  New problem w/ Ca=12.1 on today's lab; prev 8.1-10.4 and she is on Calcium supplement, Fosamax70, VitD1000; she has hx breast ca but no known recurrence & no bone pain reported; advised to stop Calcium & we are adjusting Lasix, recheck 72mo... We reviewed prob list,  meds, xrays and labs> see below for updates >> She did not bring med bottles or med list to the OV today... LABS 5/14:  Chems-  BS=113, BUN=59, Creat=1.7, Ca=12.1...   ~  Dec 26, 2012:  10d ROV & Audrey Reed has made a medication error> last OV on Lasix40Bid & Zaroxyln2.6M&F and had 14# wt loss w/ decrease in edema; labs showed BUN=59, Creat=1.7, Ca=12.1; we recommended stopping calcium supplement, stop Zaroxyln, and continue the Lasix but change it to 80mg Qam; she misunderstood & added the Lasix80Qam to her prev Lasix40Bid!  She notes incr urination & sl dizzy, BP today is 94/60, & exam shows tr-1+edema w/ wt down 1-2# further; f/u labs revealed BUN=67, Cr=1.7 (incidentally her Ca=9.9 and K=4.3)... She is now instructed to STOP the Bid Lasix & just take the Lasix80mg  Qam (HER SON IS ASKED TO SUPERVISE HER MEDS)...  ~  February 04, 2013:  6week ROV recheck> we confirmed that she is taking the Lasix as prescribed 40mg tabs-2Qam, along w/ her Coreg12.5Bid, Lisin40, Minoxidil10; BP= 150/58 & she denies CP, palpit, SOB, & improved edema w/ wt down 3# to 140# today... Other meds & problems as above & stable;  Asked to continue the current meds the same & f/u in 3months w/ repeat labs...             Problem List:  HYPERTENSION (ICD-401.9) >>  ~  9/12:  BP= 130/72 & similar at home> feeling well & denies HA, fatigue, visual changes, CP, palipit, dizziness, syncope, dyspnea, etc... ~  3/13:  BP= 158/92 & she is reminded to take meds every day; she remains asymptomatic w/o CP, palpit, SOB, edema, etc... ~  9/13:  BP= 180/82 & she is asymptomatic; we decided to incr the LISINOPRIL to 40mg /d... ~  Hosp [facility] 12/29 - 08/15/12  W/ UTI, HBP, DM, Anxiety & FTT; she developed diarrhea on the antibiotics- CDiff neg; also had episode of asp pneum- prolonged her hosp stay & resolved w/ conservative rx; due to FTT- she agreed to palliative care consult & DNR written; she was sent to [facility] for rehab & is still there>> c/o some  weeping from her legs, gas pains, wants to be disch from [facility]... ~  09/11/12: on Coreg12.5Bid, Lisin40, Minoxidil10, Lasix40Bid; BP=110/60 & she denies CP/angina, palpit, ch in SOB/DOE; notes some edema & some "weeping"; Labs showed Creat up to 1.8 & BNP improved to 145, therefore rec to decr Lasix40Qam... ~  10/01/12: on Coreg12.5Bid, Lisin40, Minox10; Lasix40, Zaroxyln2.5-M&F (added by [facility] in [place]), K10-MWF; BP=152/84 & she feels better home from [facility] x1wk; Labs showed K=5.5 (on K20MWF), BUN=24, Creat=1.3; therefore keep meds the same for now & ROV 72mo. ~  3/14:  on Coreg12.5Bid, Lisin40, Minox10;  Lasix40, Zaroxyln2.5-M&F (added by DrGates in [place]), K10-MWF; BP= 110/50 & labs today showed K=5.5, BUN=41, Creat=1.4, BNP=209; Rec to stop the KCl & f/u ov in 6 weeks. ~  5/14: on Coreg12.5Bid, Lisin40, Minoxidil10, Lasix40Bid & Zaroxyln 2.5onM&F; BP=134/52 & she denies CP/angina, palpit, ch in SOB/DOE; notes edema improved; labs today as above & rec to stop Zaroxyln & ch Lasix to 80Qam...  HYPERCALCEMIA >> NEW PROB 5/14 w/ routine labs showing Ca=12.1 (prev 8.1-10.4); we decided to STOP calcium supplement, adjusted her Lasix, & recheck lab in one month=> Ca=9.9  CORONARY ARTERY DISEASE (ICD-414.00) - on ASA 81mg /d... no CP, palpit, SOB, etc... exerc= yard, walking... ~  cath 2/91 by DrWeintraub showed 70% single vessel dis RCA & MVP... ~  In [place] 1/14> she developed transient AFib=> converted spont;  EKG w/ RBBB...  VENOUS INSUFFICIENCY (ICD-459.81) - she has mod Ven Insuffic w/ chr edema & dry skin dermatitis> knows to follow a low sodium diet, elevate legs, wear support hose when able "[name]", & takes the LASIX daily. ~  2/14:  Zaroxyln 2.5-MWF added by DrGates in [place]; f/u labs showed K=5.5, BUN=24, Creat=1.3; therefore keep meds the same for now & ROV 58mo  HYPERCHOLESTEROLEMIA (ICD-272.0) - on SIMVASTATIN 20mg /d... tolerating well, +diet... ~  FLP 5/08 showed TChol 162, TG 82, HDL 63, LDL  83... ~  FLP 3/09 showed TChol 179, TG 67, HDL 73, LDL 93... rec- same med. ~  FLP 3/10 showed TChol 163, TG 71, HDL 72, LDL 77 ~  FLP 2/11 showed TChol 182, TG 91, HDL 88, LDL 76... Continue same. ~  FLP 3/12 showed TChol 167, TG 78, HDL 79, LDL 73 ~  3/13: she is not fasting for labs today & declines ret for FLP, prefers to wait til ROV in 30mo... ~  FLP 9/13 on Simva20 showed TChol 178, TG 71, HDL 80, LDL 84  DM (ICD-250.00) - on diet + METFORMIN 500mg /d added 3/09... ~  labs 5/08 showed BS=166, HgA1c=6.5 ~  labs 3/09 showed BS= 161 & Metformin 500mg /d started... ~  lab 9/09 showed BS= 111, HgA1c= 6.1.Marland KitchenMarland Kitchen rec- continue same. ~  labs 3/10 showed BS= 149, A1c= 6.2 ~  labs 9/10 showed BS= 112, A1c= 5.8 ~  labs 2/11 showed BS= 115, A1c= 6.4 ~  Labs 3/12 showed BS= 135, A1c= not done... ~  3/13: she is not fasting for labs today & declines ret for FLP, prefers to wait til ROV in 30mo... ~  Labs 9/13 on Metform500 showed BS=149, A1c=5.9 ~  2/14: on Metform500Qam; Labs showed BS=125, A1c=6.0; good control, keep same... ~  5/14:  on Metform500Qam; Labs showed BS=113; last A1c=6.0 (2/14); good control, keep same.  DIVERTICULOSIS OF COLON (ICD-562.10) - last colonoscopy was 11/04 by DrDBrodie showing divertics & polyp (not retrieved)... she denies abd pain, D/ C/ blood etc...  RENAL INSUFFIC >> ~  5/14:  Labs showed BUN~60, and Creat=1.7 on Lasix40Bid + Zaroxyln 2.5 on M&F=> changed to Lasix80Qam & off the Zaroxyln...  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 surg by Lurline Hare, followed by Duwayne Heck and [facility] stopped 4/10 after 4.1yrs Rx... ~  saw Duwayne Heck 10/09- doing well, labs OK... f/u Mammogram was neg,  f/u BMD on Femara showed osteopenia w/ TScores -1.4 to -2.2 (sl better in Spine, sl worse in hip)- continue present meds. ~  saw DrIngram 12/09- doing well... ~  saw DrLivesay 4/10 & Femara stopped... ~  f/u Mammogram  10/10 at [facility]= neg...  ~  saw Duwayne Heck 6/11- doing well, no changes made, f/u Prn. ~  f/u Mammogram 10/11 at [facility] showed benign findings, no worrisome abn, f/u 42yr. ~  f/u Mammogram 10/12 at [facility] is similar in appearance, stable, f/u 24yr...  DEGENERATIVE JOINT DISEASE, GENERALIZED (ICD-715.00) OSTEOPOROSIS (ICD-733.00) - she has osteopenia on [facility]- on FOSAMAX 70mg /wk, +Caltrate, +Vits w/ VitD. ~  BMD 5/07 at [facility] w/ TScores -1.5 to -2.3.Marland KitchenMarland KitchenMarland Kitchen this was improved from 12/05 values... ~  labs 9/09 showed Vit D leve3l = 32... rec- OTC Vit D supplement. ~  BMD 10/09 at [facility] showed TScores -1.4 to -2.2 (sl better in Spine, sl worse in hip)- continue present meds. ~  f/u BMD due 10/11> done at [facility] w/ TScores -0.9 in Spine (16% improved), & -1.8 in left FemNeck (6% worse); continue Alendronate. ~  Labs 3/12 showed Vit D level = 39... rec OTC Vit D supplement ~2000u daily... ~  [facility] 1/14> UTI, mult issues, & FTT- to [facility] for PT etc => improved...  ANXIETY DISORDER, GENERALIZED (ICD-300.02) - on ALPRAZOLAM 0.25mg  Prn... her daughter's husb has Melanoma & it is very stressful to the family.  Health Maintenance - she gets the yearly FLU vaccine;  has PNEUMOVAX 9/09;  ?last Tetanus vaccine...   Past Surgical History  Procedure Laterality Date  . Cataract extraction    . Mastectomy      for breast cancer    Outpatient Encounter Prescriptions as of 02/04/2013  Medication Sig Dispense Refill  . acetaminophen (TYLENOL) 325 MG tablet Take 2 tablets (650 mg total) by mouth every 6 (six) hours as needed (or Fever >/= 101).      Marland Kitchen alendronate (FOSAMAX) 70 MG tablet Take 70 mg by mouth every 7 (seven) days. Take with a full glass of water on an empty stomach. Take on Wednesday      . carvedilol (COREG) 12.5 MG tablet Take 1 tablet (12.5 mg total) by mouth 2 (two) times daily with a meal.  180 tablet  3  . Cholecalciferol (VITAMIN D3) 1000 UNITS CAPS Take 2 capsules by  mouth daily.      . cholestyramine light (PREVALITE) 4 GM/DOSE powder Take 4 g by mouth as needed.      . feeding supplement (ENSURE) PUDG Take 1 Container by mouth 3 (three) times daily between meals.      . furosemide (LASIX) 40 MG tablet Take 2 tablets by mouth every morning      . lisinopril (PRINIVIL,ZESTRIL) 40 MG tablet Take 1 tablet (40 mg total) by mouth daily.  90 tablet  3  . loperamide (IMODIUM) 2 MG capsule Take 2 mg by mouth 2 (two) times daily. As needed for diarrhea      . metFORMIN (GLUCOPHAGE) 500 MG tablet Take 1 tablet (500 mg total) by mouth daily with breakfast.  90 tablet  3  . minoxidil (LONITEN) 10 MG tablet Take 1 tablet (10 mg total) by mouth daily.  30 tablet  11  . Multiple Vitamins-Minerals (CENTRUM SILVER ADULT 50+ PO) Take 1 tablet by mouth daily.      Marland Kitchen saccharomyces boulardii (FLORASTOR) 250 MG capsule Take 1 capsule (250 mg total) by mouth daily.  30 capsule  11  . silver sulfADIAZINE (SILVADENE) 1 % cream Use as directed  50 g  5  . simvastatin (ZOCOR) 20 MG tablet Take 1 tablet (20 mg total) by mouth at bedtime.  30 tablet  11  No facility-administered encounter medications on file as of 02/04/2013.    Allergies  Allergen Reactions  . Nitrofurantoin     REACTION: unsure of reaction--happened long ago  . Tramadol Nausea Only  . Codeine Nausea And Vomiting, Swelling and Rash    Current Medications, Allergies, Past Medical History, Past Surgical History, Family History, and Social History were reviewed in [employer] record.    Review of Systems        See HPI - all other systems neg except as noted...  The patient complains of dyspnea on exertion and difficulty walking.  The patient denies anorexia, fever, weight loss, weight gain, vision loss, decreased hearing, hoarseness, chest pain, syncope, peripheral edema, prolonged cough, headaches, hemoptysis, abdominal pain, melena, hematochezia, severe indigestion/heartburn, hematuria,  incontinence, muscle weakness, suspicious skin lesions, transient blindness, depression, unusual weight change, abnormal bleeding, enlarged lymph nodes, and angioedema.     Objective:   Physical Exam    WD, WN, 79 y/o WF in NAD but chr ill appearing... GENERAL:  Alert & oriented; pleasant & cooperative... HEENT:  [place]/AT, EOM-wnl, PERRLA, EACs-clear, TMs-wnl, NOSE-clear, THROAT-clear & wnl. NECK:  Supple w/ fairROM; no JVD; normal carotid impulses w/o bruits; no thyromegaly or nodules palpated; no lymphadenopathy. CHEST:  Clear to P & A; without wheezes/ rales/ or rhonchi heard... HEART:  Regular Rhythm; without murmurs/ rubs/ or gallops detected... ABDOMEN:  Soft & nontender; mild panniculus; normal bowel sounds; no organomegaly or masses palpated... EXT: without deformities or arthritic changes; no varicose veins/ +venous insuffic/ 3+ edema. NEURO:  CN's intact;  no focal neuro deficits... DERM:  Dry skin dermatitis on LEs; right lower leg is wrapped...  RADIOLOGY DATA:  Reviewed in the EPIC EMR & discussed w/ the patient...  LABORATORY DATA:  Reviewed in the EPIC EMR & discussed w/ the patient...   Assessment & Plan:    HBP, Renal Insuffic>  On med regimen adjusted during her 1/14 [facility] & several times since then; We have stopped the Zaroxyln & changed Lasix to 80mg  Qam...  HYPERCALCEMIA>  NEW PROB 5/14 w/ routine labs showing Ca=12.1 (prev 8.1-10.4); we decided to STOP calcium supplement, adjusted her Lasix, & recheck lab => Ca=9.9   CAD>  On ASA daily, asymptomatic & encouraged to exercise regularly w/ her rehab program...  Ven Insuffic>  She has VI, chr edema & dry skin dermatitis; we have adjusted her diuretics several times- today we will stop the Zaroxyln & ch Lasix to 80mg Qam...  CHOL>  On Simva20 + diet;  Continue same...  DM>  On Metformin 500mg /d + diet;  Continue same...  GI> hx Divertics & Polyp>  She is improving w/ resolution of diarrhea- rec to wean Probiotic &  Immodium...  Hx Breast Cancer>  Doing satis w/o recurrent disease, but elev calcium is of concern- we will make adjustments & repeat lab, consider bone scan of Ca remains elev...  DJD, Osteoporosis>  Doing satis on the Fosamax etc...  Anxiety>  Off Alprazolam now & hasn't needed...   Patient's Medications  New Prescriptions   No medications on file  Previous Medications   ACETAMINOPHEN (TYLENOL) 325 MG TABLET    Take 2 tablets (650 mg total) by mouth every 6 (six) hours as needed (or Fever >/= 101).   ALENDRONATE (FOSAMAX) 70 MG TABLET    Take 70 mg by mouth every 7 (seven) days. Take with a full glass of water on an empty stomach. Take on Wednesday   CARVEDILOL (COREG) 12.5 MG TABLET  Take 1 tablet (12.5 mg total) by mouth 2 (two) times daily with a meal.   CHOLECALCIFEROL (VITAMIN D3) 1000 UNITS CAPS    Take 2 capsules by mouth daily.   CHOLESTYRAMINE LIGHT (PREVALITE) 4 GM/DOSE POWDER    Take 4 g by mouth as needed.   FEEDING SUPPLEMENT (ENSURE) PUDG    Take 1 Container by mouth 3 (three) times daily between meals.   FUROSEMIDE (LASIX) 40 MG TABLET    Take 2 tablets by mouth every morning   LISINOPRIL (PRINIVIL,ZESTRIL) 40 MG TABLET    Take 1 tablet (40 mg total) by mouth daily.   LOPERAMIDE (IMODIUM) 2 MG CAPSULE    Take 2 mg by mouth 2 (two) times daily. As needed for diarrhea   METFORMIN (GLUCOPHAGE) 500 MG TABLET    Take 1 tablet (500 mg total) by mouth daily with breakfast.   MINOXIDIL (LONITEN) 10 MG TABLET    Take 1 tablet (10 mg total) by mouth daily.   MULTIPLE VITAMINS-MINERALS (CENTRUM SILVER ADULT 50+ PO)    Take 1 tablet by mouth daily.   SACCHAROMYCES BOULARDII (FLORASTOR) 250 MG CAPSULE    Take 1 capsule (250 mg total) by mouth daily.   SILVER SULFADIAZINE (SILVADENE) 1 % CREAM    Use as directed   SIMVASTATIN (ZOCOR) 20 MG TABLET    Take 1 tablet (20 mg total) by mouth at bedtime.  Modified Medications   No medications on file  Discontinued Medications   No  medications on file

## 2013-03-18 ENCOUNTER — Ambulatory Visit: Payer: Medicare Other | Admitting: Pulmonary Disease

## 2013-03-31 DIAGNOSIS — B351 Tinea unguium: Secondary | ICD-10-CM | POA: Diagnosis not present

## 2013-03-31 DIAGNOSIS — M79609 Pain in unspecified limb: Secondary | ICD-10-CM | POA: Diagnosis not present

## 2013-03-31 HISTORY — DX: Tinea unguium: B35.1

## 2013-04-16 ENCOUNTER — Encounter: Payer: Self-pay | Admitting: *Deleted

## 2013-04-25 ENCOUNTER — Encounter: Payer: Self-pay | Admitting: *Deleted

## 2013-04-25 DIAGNOSIS — B351 Tinea unguium: Secondary | ICD-10-CM | POA: Insufficient documentation

## 2013-05-04 DIAGNOSIS — Z23 Encounter for immunization: Secondary | ICD-10-CM | POA: Diagnosis not present

## 2013-05-09 ENCOUNTER — Other Ambulatory Visit: Payer: Self-pay | Admitting: Pulmonary Disease

## 2013-05-12 ENCOUNTER — Other Ambulatory Visit (INDEPENDENT_AMBULATORY_CARE_PROVIDER_SITE_OTHER): Payer: Medicare Other

## 2013-05-12 ENCOUNTER — Ambulatory Visit (INDEPENDENT_AMBULATORY_CARE_PROVIDER_SITE_OTHER): Payer: Medicare Other | Admitting: Pulmonary Disease

## 2013-05-12 ENCOUNTER — Encounter: Payer: Self-pay | Admitting: Pulmonary Disease

## 2013-05-12 VITALS — BP 144/62 | HR 53 | Temp 97.7°F | Ht 61.0 in | Wt 136.2 lb

## 2013-05-12 DIAGNOSIS — F411 Generalized anxiety disorder: Secondary | ICD-10-CM

## 2013-05-12 DIAGNOSIS — E7800 Pure hypercholesterolemia, unspecified: Secondary | ICD-10-CM

## 2013-05-12 DIAGNOSIS — R609 Edema, unspecified: Secondary | ICD-10-CM | POA: Diagnosis not present

## 2013-05-12 DIAGNOSIS — N289 Disorder of kidney and ureter, unspecified: Secondary | ICD-10-CM

## 2013-05-12 DIAGNOSIS — I2510 Atherosclerotic heart disease of native coronary artery without angina pectoris: Secondary | ICD-10-CM | POA: Diagnosis not present

## 2013-05-12 DIAGNOSIS — K5730 Diverticulosis of large intestine without perforation or abscess without bleeding: Secondary | ICD-10-CM | POA: Diagnosis not present

## 2013-05-12 DIAGNOSIS — E119 Type 2 diabetes mellitus without complications: Secondary | ICD-10-CM

## 2013-05-12 DIAGNOSIS — I10 Essential (primary) hypertension: Secondary | ICD-10-CM

## 2013-05-12 DIAGNOSIS — D126 Benign neoplasm of colon, unspecified: Secondary | ICD-10-CM

## 2013-05-12 DIAGNOSIS — M159 Polyosteoarthritis, unspecified: Secondary | ICD-10-CM

## 2013-05-12 DIAGNOSIS — C50919 Malignant neoplasm of unspecified site of unspecified female breast: Secondary | ICD-10-CM

## 2013-05-12 DIAGNOSIS — I872 Venous insufficiency (chronic) (peripheral): Secondary | ICD-10-CM

## 2013-05-12 DIAGNOSIS — M810 Age-related osteoporosis without current pathological fracture: Secondary | ICD-10-CM

## 2013-05-12 LAB — CBC WITH DIFFERENTIAL/PLATELET
Basophils Absolute: 0 10*3/uL (ref 0.0–0.1)
Eosinophils Absolute: 0.3 10*3/uL (ref 0.0–0.7)
Eosinophils Relative: 3.3 % (ref 0.0–5.0)
HCT: 37.8 % (ref 36.0–46.0)
Lymphocytes Relative: 29.4 % (ref 12.0–46.0)
Lymphs Abs: 2.8 10*3/uL (ref 0.7–4.0)
MCV: 86.8 fl (ref 78.0–100.0)
Monocytes Absolute: 0.7 10*3/uL (ref 0.1–1.0)
Neutrophils Relative %: 58.9 % (ref 43.0–77.0)
Platelets: 181 10*3/uL (ref 150.0–400.0)
RDW: 15.1 % — ABNORMAL HIGH (ref 11.5–14.6)
WBC: 9.4 10*3/uL (ref 4.5–10.5)

## 2013-05-12 LAB — BASIC METABOLIC PANEL
Creatinine, Ser: 1.6 mg/dL — ABNORMAL HIGH (ref 0.4–1.2)
GFR: 31.94 mL/min — ABNORMAL LOW (ref >60.00)
Glucose, Bld: 130 mg/dL — ABNORMAL HIGH (ref 70–99)
Potassium: 5.2 mEq/L — ABNORMAL HIGH (ref 3.5–5.1)
Sodium: 137 mEq/L (ref 135–145)

## 2013-05-12 LAB — HEPATIC FUNCTION PANEL
ALT: 14 U/L (ref 0–35)
AST: 17 U/L (ref 0–37)
Albumin: 4.4 g/dL (ref 3.5–5.2)
Alkaline Phosphatase: 26 U/L — ABNORMAL LOW (ref 39–117)

## 2013-05-12 LAB — LIPID PANEL
LDL Cholesterol: 74 mg/dL (ref 0–99)
VLDL: 14.8 mg/dL (ref 0.0–40.0)

## 2013-05-12 NOTE — Progress Notes (Signed)
Subjective:    Patient ID: Audrey Reed, female    DOB: September 25, 1917, 79 y.o.   MRN: [id number]  HPI 79 y/o WF here for a follow up visit... she has mult med problems as noted below...   ~  September 11, 2012:  77mo ROV & post hosp check> H[facility] 12/29 - 08/15/12 w/ UTI, HBP, DM, Anxiety & FTT; she developed diarrhea on the antibiotics, CDiff neg; also had episode of asp pneum- prolonged her hosp stay & resolved w/ conservative rx; due to FTT- she agreed to palliative care consult & DNR written; she was sent to [facility] for rehab & is still there>> c/o some weeping from her legs, gas pains, wants to be disch from [facility]...    HBP, CAD, Ven Insuffic> on Coreg12.5Bid, Lisin40, Minoxidil10, Lasix40Bid; BP=110/60 & she denies CP/angina, palpit, ch in SOB/DOE; notes some edema & some "weeping"; Labs showed Creat up to 1.8 7 BNP improved to 145, therefore rec to decr Lasix40Qam...    Chol> on Simva20; last FLP was 9/13 w/ TChol 178, TG 71, HDL 80, LDL 84    DM> on Metform500Qam; Labs showed BS=125, A1c=6.0; good control, keep same...    GI> Divertics, hx polyp> followed by DrDBrodie for GI; currently taking Florastar, Immodium, Cholestyramine, Ensure pudding; diarrhea from recent hosp is improved...    Hx breast cancer> part mastectomy 2005 by DrIngram; followed by Duwayne Heck on Femara for 4-59yrs; last seen 6/11- doing well & f/u prn...    DJD, Osteopenia> on Fosamax, Calcium, MVI, VitD, Tylenol; last BMD was 10/11 at [facility] w/ TScore -1.8 in Fem Neck; continue same meds...    Debilitated & FTT> she was disch from [place] to [facility] for rehab...    Anxiety> off prev alprazolam, she is coping well she says... We reviewed prob list, meds, xrays and labs> see below for updates >> had Flu vaccine 9/13... LABS 2/14:  Chems- ok x BS=125 A1c=6.0 Creat=1.8 BNP=145   ~  October 01, 2012:  3wk ROV & 77ashauna is now home from [employer] & getting home therapy etc; DrGates added Zaroxyln 2.5mg  on M&F & sent her home on this;  recall that we decr her Lasix from 40Bid to just Qam last visit due to Creat up from 0.8 to 1.8 & BNP was down to 145; she has lost 12# over the last 3 wks- down to 153# now but still has 2-3+ edema in LEs; wound on right leg is improved, still doing dressing changes and only min weeping reported,  BP= 152/84, chest is clear, and heart is regular...  We decided to recheck BMet 1st & decide on adjustments => K=5.5, BUN=24, Creat=1.3; therefore keep meds the same for now & ROV 357mo...  ~  October 23, 2012:  77mo ROV & unfortunately Audrey Reed has gained 7# up to 159# today; we reviewed low sodium, elevation, support hose, etc; she has remained on the Lasix40/d & the Zaroxyln2.5 on M&F; Labs today showed K=5.5, BUN=41, Creat=1.4, BNP=209... We decided to stop the KCl, keep others the same & recheck pt in 6wks...  ~  Dec 16, 2012:  77mo ROV & Audrey Reed notes that her legs are much better w/ decr edema and loss of 14# from last visit; she has noted worsening urinary incont w/ the diuretics & is wearing depends; we rechecked her BMet & found> BUN=59, Creat=1.7, Ca=12.1;  Rec to STOP ZAROXYLN & CALCIUM supplement, CHANGE LASIX40 to 2Qam... Plan f/u BMet via lab in 1 mo.Marland KitchenMarland Kitchen  HBP, CAD, Ven Insuffic, Edema> on Coreg12.5Bid, Lisin40, Minoxidil10, Lasix40Bid & Zaroxyln 2.5onM&F; BP=134/52 & she denies CP/angina, palpit, ch in SOB/DOE; notes edema improved; labs today as above & rec to stop Zaroxyln & ch Lasix to 80Qam...    DM> on Metform500Qam; Labs showed BS=113; last A1c=6.0 (2/14); good control, keep same...    Hx breast cancer> part mastectomy 2005 by DrIngram; followed by Duwayne Heck on Femara for 4-69yrs; last seen 6/11- doing well & f/u prn...    Hypercalcemia>  New problem w/ Ca=12.1 on today's lab; prev 8.1-10.4 and she is on Calcium supplement, Fosamax70, VitD1000; she has hx breast ca but no known recurrence & no bone pain reported; advised to stop Calcium & we are adjusting Lasix, recheck 39mo... We reviewed prob list,  meds, xrays and labs> see below for updates >> She did not bring med bottles or med list to the OV today... LABS 5/14:  Chems-  BS=113, BUN=59, Creat=1.7, Ca=12.1...   ~  Dec 26, 2012:  77d ROV & Audrey Reed has made a medication error> last OV on Lasix40Bid & Zaroxyln2.45M&F and had 14# wt loss w/ decrease in edema; labs showed BUN=59, Creat=1.7, Ca=12.1; we recommended stopping calcium supplement, stop Zaroxyln, and continue the Lasix but change it to 80mg Qam; she misunderstood & added the Lasix80Qam to her prev Lasix40Bid!  She notes incr urination & sl dizzy, BP today is 94/60, & exam shows tr-1+edema w/ wt down 1-2# further; f/u labs revealed BUN=67, Cr=1.7 (incidentally her Ca=9.9 and K=4.3)... She is now instructed to STOP the Bid Lasix & just take the Lasix80mg  Qam (HER SON IS ASKED TO SUPERVISE HER MEDS)...  ~  February 04, 2013:  77week ROV recheck> we confirmed that she is taking the Lasix as prescribed 40mg tabs-2Qam, along w/ her Coreg12.5Bid, Lisin40, Minoxidil10; BP= 150/58 & she denies CP, palpit, SOB, & improved edema w/ wt down 3# to 140# today... Other meds & problems as above & stable;  Asked to continue the current meds the same & f/u in 77months w/ repeat labs...    ~  May 12, 2013:  77mo ROV & Audrey Reed is back to baseline- feeling well w/o any new complaints or concerns... Her wt is 136# which is down 4# from prev...    HBP, CAD, VI, Edema> on Coreg6.25Bid, Lisinopril40, Loniten10, Lasix40-2Qam; BP= 144/62 & she denies HA, CP, palpit, ch in SOB, ch in edema...    Chol> on Simva20; FLP 10/14 showed TChol 172, TG 74, HDL 84, LDL 74 and rec to continue same...    DM> on Metform500Qam; Labs 10/14 showed BS=130, A1c=6.0 and rec to continue same...     RI> Lasix prev adjusted to 80mg Qam; Labs 10/14 showed BUN=56, Cr=1.6 and rec to incr free water intake... We reviewed prob list, meds, xrays and labs> see below for updates >>  LABS 10/14:  FLP- at goals on Simva20;  Chems- improved w/ BS=130, A1c=6.0,  BUN=56, Cr=1.6;  CBC- wnl;  TSH=2.11...           Problem List:  HYPERTENSION (ICD-401.9) >>  ~  9/12:  BP= 130/72 & similar at home> feeling well & denies HA, fatigue, visual changes, CP, palipit, dizziness, syncope, dyspnea, etc... ~  3/13:  BP= 158/92 & she is reminded to take meds every day; she remains asymptomatic w/o CP, palpit, SOB, edema, etc... ~  9/13:  BP= 180/82 & she is asymptomatic; we decided to incr the LISINOPRIL to 40mg /d... ~  [facility] 12/29 - 08/15/12  W/  UTI, HBP, DM, Anxiety & FTT; she developed diarrhea on the antibiotics- CDiff neg; also had episode of asp pneum- prolonged her hosp stay & resolved w/ conservative rx; due to FTT- she agreed to palliative care consult & DNR written; she was sent to [facility] for rehab & is still there>> c/o some weeping from her legs, gas pains, wants to be disch from [facility]... ~  09/11/12: on Coreg12.5Bid, Lisin40, Minoxidil10, Lasix40Bid; BP=110/60 & she denies CP/angina, palpit, ch in SOB/DOE; notes some edema & some "weeping"; Labs showed Creat up to 1.8 & BNP improved to 145, therefore rec to decr Lasix40Qam... ~  10/01/12: on Coreg12.5Bid, Lisin40, Minox10; Lasix40, Zaroxyln2.5-M&F (added by [facility] in [place]), K10-MWF; BP=152/84 & she feels better home from [facility] x1wk; Labs showed K=5.5 (on K20MWF), BUN=24, Creat=1.3; therefore keep meds the same for now & ROV 34mo. ~  3/14:  on Coreg12.5Bid, Lisin40, Minox10; Lasix40, Zaroxyln2.5-M&F (added by DrGates in [place]), K10-MWF; BP= 110/50 & labs today showed K=5.5, BUN=41, Creat=1.4, BNP=209; Rec to stop the KCl & f/u ov in 6 weeks. ~  5/14: on Coreg12.5Bid, Lisin40, Minoxidil10, Lasix40Bid & Zaroxyln 2.5onM&F; BP=134/52 & she denies CP/angina, palpit, ch in SOB/DOE; notes edema improved; labs today as above & rec to stop Zaroxyln & ch Lasix to 80Qam... ~  10/14: on Coreg6.25Bid, Lisinopril40, Loniten10, Lasix40-2Qam; BP= 144/62 & she denies HA, CP, palpit, ch in SOB, ch in edema.  HYPERCALCEMIA >> NEW  PROB 5/14 w/ routine labs showing Ca=12.1 (prev 8.1-10.4); we decided to STOP calcium supplement, adjusted her Lasix, & recheck lab in one month=> Ca=9.9, & all subseq Ca values wnl...   CORONARY ARTERY DISEASE (ICD-414.00) - on ASA 81mg /d... no CP, palpit, SOB, etc... exerc= yard, walking... ~  cath 2/91 by DrWeintraub showed 70% single vessel dis RCA & MVP... ~  In [place] 1/14> she developed transient AFib=> converted spont;  EKG w/ RBBB...  VENOUS INSUFFICIENCY (ICD-459.81) - she has mod Ven Insuffic w/ chr edema & dry skin dermatitis> knows to follow a low sodium diet, elevate legs, wear support hose when able "[name]", & takes the LASIX daily. ~  2/14:  Zaroxyln 2.5-MWF added by DrGates in [place]; f/u labs showed K=5.5, BUN=24, Creat=1.3; therefore keep meds the same for now & ROV 34mo  HYPERCHOLESTEROLEMIA (ICD-272.0) - on SIMVASTATIN 20mg /d... tolerating well, +diet... ~  FLP 5/08 showed TChol 162, TG 82, HDL 63, LDL 83... ~  FLP 3/09 showed TChol 179, TG 67, HDL 73, LDL 93... rec- same med. ~  FLP 3/10 showed TChol 163, TG 71, HDL 72, LDL 77 ~  FLP 2/11 showed TChol 182, TG 91, HDL 88, LDL 76... Continue same. ~  FLP 3/12 showed TChol 167, TG 78, HDL 79, LDL 73 ~  3/13: she is not fasting for labs today & declines ret for FLP, prefers to wait til ROV in 10mo... ~  FLP 9/13 on Simva20 showed TChol 178, TG 71, HDL 80, LDL 84 ~  FLP 10/14 on Simva20 showed TChol 172, TG 74, HDL 84, LDL 74  DM (ICD-250.00) - on diet + METFORMIN 500mg /d added 3/09... ~  labs 5/08 showed BS=166, HgA1c=6.5 ~  labs 3/09 showed BS= 161 & Metformin 500mg /d started... ~  lab 9/09 showed BS= 111, HgA1c= 6.1.Marland KitchenMarland Kitchen rec- continue same. ~  labs 3/10 showed BS= 149, A1c= 6.2 ~  labs 9/10 showed BS= 112, A1c= 5.8 ~  labs 2/11 showed BS= 115, A1c= 6.4 ~  Labs 3/12 showed BS= 135, A1c=  not done... ~  3/13: she is not fasting for labs today & declines ret for FLP, prefers to wait til ROV in 44mo... ~  Labs 9/13 on  Metform500 showed BS=149, A1c=5.9 ~  2/14: on Metform500Qam; Labs showed BS=125, A1c=6.0; good control, keep same... ~  5/14:  on Metform500Qam; Labs showed BS=113; last A1c=6.0 (2/14); good control, keep same. ~  10/14: on Metform500Qam; Labs 10/14 showed BS=130, A1c=6.0 and rec to continue same  DIVERTICULOSIS OF COLON (ICD-562.10) - last colonoscopy was 11/04 by DrDBrodie showing divertics & polyp (not retrieved)... she denies abd pain, D/ C/ blood etc...  RENAL INSUFFIC >> ~  5/14:  Labs showed BUN~60, and Creat=1.7 on Lasix40Bid + Zaroxyln 2.5 on M&F=> changed to Lasix80Qam & off the Zaroxyln...  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 surg by Lurline Hare, followed by Duwayne Heck and [facility] stopped 4/10 after 4.85yrs Rx... ~  saw Duwayne Heck 10/09- doing well, labs OK... f/u Mammogram was neg,  f/u BMD on Femara showed osteopenia w/ TScores -1.4 to -2.2 (sl better in Spine, sl worse in hip)- continue present meds. ~  saw DrIngram 12/09- doing well... ~  saw DrLivesay 4/10 & Femara stopped... ~  f/u Mammogram 10/10 at [facility]= neg...  ~  saw Duwayne Heck 6/11- doing well, no changes made, f/u Prn. ~  f/u Mammogram 10/11 at [facility] showed benign findings, no worrisome abn, f/u 50yr. ~  f/u Mammogram 10/12 at [facility] is similar in appearance, stable, f/u 55yr...  DEGENERATIVE JOINT DISEASE, GENERALIZED (ICD-715.00) OSTEOPOROSIS (ICD-733.00) - she has osteopenia on scans from [facility]- on FOSAMAX 70mg /wk, +Caltrate, +Vits w/ VitD. ~  BMD 5/07 at [facility] w/ TScores -1.5 to -2.3.Marland KitchenMarland KitchenMarland Kitchen this was improved from 12/05 values... ~  labs 9/09 showed Vit D leve3l = 32... rec- OTC Vit D supplement. ~  BMD 10/09 at [facility] showed TScores -1.4 to -2.2 (sl better in Spine, sl worse in hip)- continue present meds. ~  f/u BMD due 10/11> done at [facility] w/ TScores -0.9 in Spine (16% improved), & -1.8 in left FemNeck (6% worse); continue  Alendronate. ~  Labs 3/12 showed Vit D level = 39... rec OTC Vit D supplement ~2000u daily... ~  [facility] 1/14> UTI, mult issues, & FTT- to [facility] for PT etc => improved...  ANXIETY DISORDER, GENERALIZED (ICD-300.02) - on ALPRAZOLAM 0.25mg  Prn... her daughter's husb has Melanoma & it is very stressful to the family.  Health Maintenance - she gets the yearly FLU vaccine;  has PNEUMOVAX 9/09;  ?last Tetanus vaccine...   Past Surgical History  Procedure Laterality Date  . Cataract extraction    . Mastectomy      for breast cancer    Outpatient Encounter Prescriptions as of 05/12/2013  Medication Sig Dispense Refill  . acetaminophen (TYLENOL) 325 MG tablet Take 2 tablets (650 mg total) by mouth every 6 (six) hours as needed (or Fever >/= 101).      Marland Kitchen alendronate (FOSAMAX) 70 MG tablet TAKE 1 TABLET EVERY 7 DAYS. TAKE WITH A FULL GLASS OF WATER ON AN EMPTY STOMACH.  3 tablet  3  . carvedilol (COREG) 12.5 MG tablet Take 1 tablet (12.5 mg total) by mouth 2 (two) times daily with a meal.  180 tablet  3  . feeding supplement (ENSURE) PUDG Take 1 Container by mouth 3 (three) times daily between meals.      . furosemide (LASIX) 40 MG tablet Take 2 tablets by mouth every morning      .  lisinopril (PRINIVIL,ZESTRIL) 40 MG tablet Take 1 tablet (40 mg total) by mouth daily.  90 tablet  3  . loperamide (IMODIUM) 2 MG capsule Take 2 mg by mouth 2 (two) times daily. As needed for diarrhea      . metFORMIN (GLUCOPHAGE) 500 MG tablet Take 1 tablet (500 mg total) by mouth daily with breakfast.  90 tablet  3  . minoxidil (LONITEN) 10 MG tablet Take 1 tablet (10 mg total) by mouth daily.  30 tablet  11  . Multiple Vitamins-Minerals (CENTRUM SILVER ADULT 50+ PO) Take 1 tablet by mouth daily.      Marland Kitchen saccharomyces boulardii (FLORASTOR) 250 MG capsule Take 1 capsule (250 mg total) by mouth daily.  30 capsule  11  . silver sulfADIAZINE (SILVADENE) 1 % cream Use as directed  50 g  5  . simvastatin (ZOCOR) 20 MG tablet  Take 1 tablet (20 mg total) by mouth at bedtime.  30 tablet  11  . Cholecalciferol (VITAMIN D3) 1000 UNITS CAPS Take 2 capsules by mouth daily.      . cholestyramine light (PREVALITE) 4 GM/DOSE powder Take 4 g by mouth as needed.      . [DISCONTINUED] alendronate (FOSAMAX) 70 MG tablet Take 70 mg by mouth every 7 (seven) days. Take with a full glass of water on an empty stomach. Take on Wednesday       No facility-administered encounter medications on file as of 05/12/2013.    Allergies  Allergen Reactions  . Nitrofurantoin     REACTION: unsure of reaction--happened long ago  . Tramadol Nausea Only  . Codeine Nausea And Vomiting, Swelling and Rash    Current Medications, Allergies, Past Medical History, Past Surgical History, Family History, and Social History were reviewed in [employer] record.    Review of Systems        See HPI - all other systems neg except as noted...  The patient complains of dyspnea on exertion and difficulty walking.  The patient denies anorexia, fever, weight loss, weight gain, vision loss, decreased hearing, hoarseness, chest pain, syncope, peripheral edema, prolonged cough, headaches, hemoptysis, abdominal pain, melena, hematochezia, severe indigestion/heartburn, hematuria, incontinence, muscle weakness, suspicious skin lesions, transient blindness, depression, unusual weight change, abnormal bleeding, enlarged lymph nodes, and angioedema.     Objective:   Physical Exam    WD, WN, 79 y/o WF in NAD but chr ill appearing... GENERAL:  Alert & oriented; pleasant & cooperative... HEENT:  [place]/AT, EOM-wnl, PERRLA, EACs-clear, TMs-wnl, NOSE-clear, THROAT-clear & wnl. NECK:  Supple w/ fairROM; no JVD; normal carotid impulses w/o bruits; no thyromegaly or nodules palpated; no lymphadenopathy. CHEST:  Clear to P & A; without wheezes/ rales/ or rhonchi heard... HEART:  Regular Rhythm; without murmurs/ rubs/ or gallops detected... ABDOMEN:  Soft  & nontender; mild panniculus; normal bowel sounds; no organomegaly or masses palpated... EXT: without deformities or arthritic changes; no varicose veins/ +venous insuffic/ 3+ edema. NEURO:  CN's intact;  no focal neuro deficits... DERM:  Dry skin dermatitis on LEs; right lower leg is wrapped...  RADIOLOGY DATA:  Reviewed in the EPIC EMR & discussed w/ the patient...  LABORATORY DATA:  Reviewed in the EPIC EMR & discussed w/ the patient...   Assessment & Plan:    HBP, Renal Insuffic>  On med regimen adjusted during her 1/14 [facility] & several times since then; We stopped the Zaroxyln & changed Lasix to 80mg  Qam; BP controlled & renal back to baseline...  CAD>  On ASA daily, asymptomatic & encouraged to exercise regularly w/ her rehab program...  Ven Insuffic>  She has VI, chr edema & dry skin dermatitis; we have adjusted her diuretics several times- now stable on Lasix 80mg Qam...  CHOL>  On Simva20 + diet;  Continue same...  DM>  On Metformin 500mg /d + diet;  Continue same...  GI> hx Divertics & Polyp>  She is improving w/ resolution of diarrhea- rec to wean Probiotic & Immodium...  Hx Breast Cancer>  Doing satis w/o recurrent disease, but elev calcium is of concern- we will make adjustments & repeat lab, consider bone scan of Ca remains elev...  DJD, Osteoporosis>  Doing satis on the Fosamax etc...  Anxiety>  Off Alprazolam now & hasn't needed...   Patient's Medications  New Prescriptions   No medications on file  Previous Medications   ACETAMINOPHEN (TYLENOL) 325 MG TABLET    Take 2 tablets (650 mg total) by mouth every 6 (six) hours as needed (or Fever >/= 101).   ALENDRONATE (FOSAMAX) 70 MG TABLET    TAKE 1 TABLET EVERY 7 DAYS. TAKE WITH A FULL GLASS OF WATER ON AN EMPTY STOMACH.   CARVEDILOL (COREG) 12.5 MG TABLET    Take 1 tablet (12.5 mg total) by mouth 2 (two) times daily with a meal.   CHOLECALCIFEROL (VITAMIN D3) 1000 UNITS CAPS    Take 2 capsules by mouth daily.    CHOLESTYRAMINE LIGHT (PREVALITE) 4 GM/DOSE POWDER    Take 4 g by mouth as needed.   FEEDING SUPPLEMENT (ENSURE) PUDG    Take 1 Container by mouth 3 (three) times daily between meals.   FUROSEMIDE (LASIX) 40 MG TABLET    Take 2 tablets by mouth every morning   LISINOPRIL (PRINIVIL,ZESTRIL) 40 MG TABLET    Take 1 tablet (40 mg total) by mouth daily.   LOPERAMIDE (IMODIUM) 2 MG CAPSULE    Take 2 mg by mouth 2 (two) times daily. As needed for diarrhea   METFORMIN (GLUCOPHAGE) 500 MG TABLET    Take 1 tablet (500 mg total) by mouth daily with breakfast.   MINOXIDIL (LONITEN) 10 MG TABLET    Take 1 tablet (10 mg total) by mouth daily.   MULTIPLE VITAMINS-MINERALS (CENTRUM SILVER ADULT 50+ PO)    Take 1 tablet by mouth daily.   SACCHAROMYCES BOULARDII (FLORASTOR) 250 MG CAPSULE    Take 1 capsule (250 mg total) by mouth daily.   SILVER SULFADIAZINE (SILVADENE) 1 % CREAM    Use as directed   SIMVASTATIN (ZOCOR) 20 MG TABLET    Take 1 tablet (20 mg total) by mouth at bedtime.  Modified Medications   No medications on file  Discontinued Medications   ALENDRONATE (FOSAMAX) 70 MG TABLET    Take 70 mg by mouth every 7 (seven) days. Take with a full glass of water on an empty stomach. Take on Wednesday

## 2013-05-12 NOTE — Patient Instructions (Addendum)
Today we updated your med list in our EPIC system...    Continue your current medications the same...  Today we did your follow up FASTING blood work...    We will contact you w/ the results when available...   Keep up the good work w/ diet & exercise...  Call for any questions...  Let's plan a follow up visit in 6mo, sooner if needed for problems...   

## 2013-05-16 ENCOUNTER — Other Ambulatory Visit: Payer: Self-pay | Admitting: Pulmonary Disease

## 2013-06-30 ENCOUNTER — Ambulatory Visit (INDEPENDENT_AMBULATORY_CARE_PROVIDER_SITE_OTHER): Payer: Medicare Other | Admitting: Podiatry

## 2013-06-30 ENCOUNTER — Encounter: Payer: Self-pay | Admitting: Podiatry

## 2013-06-30 VITALS — BP 117/54 | HR 66 | Resp 14

## 2013-06-30 DIAGNOSIS — M79609 Pain in unspecified limb: Secondary | ICD-10-CM

## 2013-06-30 DIAGNOSIS — B351 Tinea unguium: Secondary | ICD-10-CM | POA: Diagnosis not present

## 2013-06-30 NOTE — Progress Notes (Signed)
°  Subjective:    Patient ID: Audrey Reed, female    DOB: 1917/11/10, 79 y.o.   MRN: [id number]  HPI trim my nails This patient presents at approximately 2-4 month intervals for debridement of painful mycotic toenails. Her initial visit was 10/04/2012.   Review of Systems     Objective:   Physical Exam Orientated x11 year old white female  Hypertrophic, incurvated,brittle toenails with texture and color changes x10.       Assessment & Plan:   Assessment: Symptomatic onychomycoses x10  Plan: All 10 toenails are debrided back without any bleeding. Reappoint at three-month intervals.

## 2013-07-28 ENCOUNTER — Telehealth: Payer: Self-pay | Admitting: Pulmonary Disease

## 2013-07-28 ENCOUNTER — Ambulatory Visit: Payer: Medicare Other

## 2013-07-28 ENCOUNTER — Ambulatory Visit (INDEPENDENT_AMBULATORY_CARE_PROVIDER_SITE_OTHER): Payer: Medicare Other | Admitting: Family Medicine

## 2013-07-28 VITALS — BP 204/88 | HR 68 | Temp 97.6°F | Resp 18

## 2013-07-28 DIAGNOSIS — M25469 Effusion, unspecified knee: Secondary | ICD-10-CM

## 2013-07-28 DIAGNOSIS — M25062 Hemarthrosis, left knee: Secondary | ICD-10-CM

## 2013-07-28 DIAGNOSIS — M25569 Pain in unspecified knee: Secondary | ICD-10-CM

## 2013-07-28 DIAGNOSIS — M25069 Hemarthrosis, unspecified knee: Secondary | ICD-10-CM | POA: Diagnosis not present

## 2013-07-28 DIAGNOSIS — M25562 Pain in left knee: Secondary | ICD-10-CM

## 2013-07-28 DIAGNOSIS — E119 Type 2 diabetes mellitus without complications: Secondary | ICD-10-CM

## 2013-07-28 DIAGNOSIS — M25462 Effusion, left knee: Secondary | ICD-10-CM

## 2013-07-28 LAB — POCT CBC
Granulocyte percent: 66.8 %G (ref 37–80)
HCT, POC: 40.3 % (ref 37.7–47.9)
MCH, POC: 28.9 pg (ref 27–31.2)
MID (cbc): 0.6 (ref 0–0.9)
MPV: 9.8 fL (ref 0–99.8)
POC Granulocyte: 5.6 (ref 2–6.9)
POC MID %: 6.6 %M (ref 0–12)
Platelet Count, POC: 175 10*3/uL (ref 142–424)
RBC: 4.36 M/uL (ref 4.04–5.48)

## 2013-07-28 NOTE — Progress Notes (Signed)
Subjective: 79 year old lady who went to bed Saturday night fine and Sunday morning she awakened with a swollen and painful left knee with difficulty bearing her weight at all. She knows of no injury. She has not had problems like this in the past that she recalls. She has no history of gout. There is no history of any fall or injury or false step preceding this. She is diabetic. She does quite well for lady 79 years old, lives independently. Her son accompanied her here today.  Objective: Her old chart shows that her diabetes has been well-controlled. Her left knee is swollen with a moderate to large palpable effusion. There is some swelling in the popliteal fossa suggestive of a Baker's cyst. Her Only has mild tenderness. Homans sign is negative. The knee does hurt on motion or palpation. There is no obvious crepitance. There is no fluid in the right knee.  Assessment: Right knee pain and effusion with probable degenerative joint disease and possible Baker's cyst  Plan: X-ray knee Check CBC and uric acid levels.  Results for orders placed in visit on 07/28/13  POCT CBC      Result Value Range   WBC 8.4  4.6 - 10.2 K/uL   Lymph, poc 2.2  0.6 - 3.4   POC LYMPH PERCENT 26.6  10 - 50 %L   MID (cbc) 0.6  0 - 0.9   POC MID % 6.6  0 - 12 %M   POC Granulocyte 5.6  2 - 6.9   Granulocyte percent 66.8  37 - 80 %G   RBC 4.36  4.04 - 5.48 M/uL   Hemoglobin 12.6  12.2 - 16.2 g/dL   HCT, POC 86.5  78.4 - 47.9 %   MCV 92.4  80 - 97 fL   MCH, POC 28.9  27 - 31.2 pg   MCHC 31.3 (*) 31.8 - 35.4 g/dL   RDW, POC 69.6     Platelet Count, POC 175  142 - 424 K/uL   MPV 9.8  0 - 99.8 fL  GLUCOSE, POCT (MANUAL RESULT ENTRY)      Result Value Range   POC Glucose 220 (*) 70 - 99 mg/dl   [facility] reading (PRIMARY) by  Dr. Alwyn Ren DJD of knees especially in the lateral compartment.  No fracture seen.  He will have the radiologist review to make sure the lateral aspect of the tibial plateau is okay.  Discuss  knee aspiration and injection technique and risks. Patient understands and approves.  Procedure note Using sterile technique the knee was aspirated from a lateral approach with her in the seated position.  24 cc of thick blood was obtained. Although I thought there was more in there I could not get it to flow and did not want to move around a lot. She tolerated the procedure well. I chose not to inject it due to the hemarthrosis.  Assessment: Hemarthrosis left knee, etiology uncertain  Plan: Radiology overread. Referred to orthopedist   Knee imobolizer Her son we'll care for her

## 2013-07-28 NOTE — Telephone Encounter (Signed)
Spoke to pt's son. States that the pt's left leg and knee are swollen, painful and pt can't bear weight on that leg. The pain and swelling started yesertday. This morning the pt can't even get out of bed due to the pain. Wants SN recs.  SN - please advise. Thanks.

## 2013-07-28 NOTE — Telephone Encounter (Signed)
Per SN - needs to call EMS/go to the ED  Pt's son is aware.

## 2013-07-28 NOTE — Telephone Encounter (Signed)
Pt's son is upset that they have not gotten an answer yet.  Would like to known if pt will be seen today or if he needs to take her to the ED.  States that pt can't even go to the bathroom.  Please advise asap.  Antionette Fairy

## 2013-07-28 NOTE — Patient Instructions (Addendum)
Refer to orthopedist to assess knee.  Elevated and ice.  Tylenol for pain

## 2013-07-28 NOTE — Telephone Encounter (Signed)
Pt's son calling again in ref to previous msg.Audrey Reed

## 2013-07-29 ENCOUNTER — Telehealth: Payer: Self-pay | Admitting: Radiology

## 2013-07-29 DIAGNOSIS — M25069 Hemarthrosis, unspecified knee: Secondary | ICD-10-CM | POA: Diagnosis not present

## 2013-07-29 LAB — SYNOVIAL CELL COUNT + DIFF, W/ CRYSTALS
Crystals, Fluid: NONE SEEN
Lymphocytes-Synovial Fld: 26 % — ABNORMAL HIGH (ref 0–20)
Neutrophil, Synovial: 74 % — ABNORMAL HIGH (ref 0–25)

## 2013-07-29 LAB — URIC ACID: Uric Acid, Serum: 7.7 mg/dL — ABNORMAL HIGH (ref 2.4–7.0)

## 2013-07-29 NOTE — Telephone Encounter (Signed)
Patients son has called back, Delaney Meigs has advised them of the appointment.

## 2013-07-29 NOTE — Telephone Encounter (Signed)
[facility], they can work her in. Dr Ophelia Charter can see her today 1:30 at [facility]. Called her to advise. Left message for her to call me back.

## 2013-08-12 DIAGNOSIS — M25069 Hemarthrosis, unspecified knee: Secondary | ICD-10-CM | POA: Diagnosis not present

## 2013-08-19 DIAGNOSIS — M1710 Unilateral primary osteoarthritis, unspecified knee: Secondary | ICD-10-CM | POA: Diagnosis not present

## 2013-08-26 DIAGNOSIS — M1710 Unilateral primary osteoarthritis, unspecified knee: Secondary | ICD-10-CM | POA: Diagnosis not present

## 2013-08-26 DIAGNOSIS — M25069 Hemarthrosis, unspecified knee: Secondary | ICD-10-CM | POA: Diagnosis not present

## 2013-09-29 ENCOUNTER — Ambulatory Visit: Payer: Medicare Other | Admitting: Podiatry

## 2013-10-02 ENCOUNTER — Other Ambulatory Visit: Payer: Self-pay | Admitting: Pulmonary Disease

## 2013-10-26 ENCOUNTER — Other Ambulatory Visit: Payer: Self-pay | Admitting: Pulmonary Disease

## 2013-11-01 ENCOUNTER — Other Ambulatory Visit: Payer: Self-pay | Admitting: Pulmonary Disease

## 2013-11-04 ENCOUNTER — Other Ambulatory Visit: Payer: Self-pay | Admitting: Pulmonary Disease

## 2013-11-17 ENCOUNTER — Ambulatory Visit: Payer: Medicare Other | Admitting: Pulmonary Disease

## 2013-11-19 ENCOUNTER — Other Ambulatory Visit: Payer: Self-pay | Admitting: Pulmonary Disease

## 2013-11-28 ENCOUNTER — Ambulatory Visit: Payer: Medicare Other | Admitting: Physician Assistant

## 2013-12-02 ENCOUNTER — Other Ambulatory Visit: Payer: Self-pay | Admitting: Pulmonary Disease

## 2013-12-12 ENCOUNTER — Ambulatory Visit: Payer: Medicare Other | Admitting: Internal Medicine

## 2013-12-12 ENCOUNTER — Other Ambulatory Visit: Payer: Self-pay | Admitting: Pulmonary Disease

## 2013-12-19 ENCOUNTER — Other Ambulatory Visit: Payer: Self-pay | Admitting: Pulmonary Disease

## 2013-12-23 ENCOUNTER — Ambulatory Visit (INDEPENDENT_AMBULATORY_CARE_PROVIDER_SITE_OTHER): Payer: Medicare Other | Admitting: Internal Medicine

## 2013-12-23 ENCOUNTER — Encounter: Payer: Self-pay | Admitting: Internal Medicine

## 2013-12-23 ENCOUNTER — Telehealth: Payer: Self-pay | Admitting: Internal Medicine

## 2013-12-23 VITALS — BP 138/72 | HR 76 | Temp 98.0°F | Resp 16 | Ht 62.0 in | Wt 130.0 lb

## 2013-12-23 DIAGNOSIS — E7800 Pure hypercholesterolemia, unspecified: Secondary | ICD-10-CM

## 2013-12-23 DIAGNOSIS — M810 Age-related osteoporosis without current pathological fracture: Secondary | ICD-10-CM

## 2013-12-23 DIAGNOSIS — E1165 Type 2 diabetes mellitus with hyperglycemia: Secondary | ICD-10-CM

## 2013-12-23 DIAGNOSIS — E1129 Type 2 diabetes mellitus with other diabetic kidney complication: Secondary | ICD-10-CM

## 2013-12-23 DIAGNOSIS — B351 Tinea unguium: Secondary | ICD-10-CM

## 2013-12-23 DIAGNOSIS — Z0000 Encounter for general adult medical examination without abnormal findings: Secondary | ICD-10-CM

## 2013-12-23 DIAGNOSIS — I10 Essential (primary) hypertension: Secondary | ICD-10-CM

## 2013-12-23 DIAGNOSIS — M159 Polyosteoarthritis, unspecified: Secondary | ICD-10-CM

## 2013-12-23 DIAGNOSIS — Z23 Encounter for immunization: Secondary | ICD-10-CM | POA: Diagnosis not present

## 2013-12-23 DIAGNOSIS — N289 Disorder of kidney and ureter, unspecified: Secondary | ICD-10-CM | POA: Diagnosis not present

## 2013-12-23 DIAGNOSIS — C50919 Malignant neoplasm of unspecified site of unspecified female breast: Secondary | ICD-10-CM

## 2013-12-23 DIAGNOSIS — I2510 Atherosclerotic heart disease of native coronary artery without angina pectoris: Secondary | ICD-10-CM

## 2013-12-23 DIAGNOSIS — R197 Diarrhea, unspecified: Secondary | ICD-10-CM

## 2013-12-23 LAB — HM DEXA SCAN

## 2013-12-23 NOTE — Assessment & Plan Note (Signed)
She has suffered from chronic diarrhea, I think she would benefit from stopping the metformin

## 2013-12-23 NOTE — Telephone Encounter (Signed)
Relevant patient education mailed to patient.  

## 2013-12-23 NOTE — Patient Instructions (Signed)
Type 2 Diabetes Mellitus, Adult Type 2 diabetes mellitus, often simply referred to as type 2 diabetes, is a long-lasting (chronic) disease. In type 2 diabetes, the pancreas does not make enough insulin (a hormone), the cells are less responsive to the insulin that is made (insulin resistance), or both. Normally, insulin moves sugars from food into the tissue cells. The tissue cells use the sugars for energy. The lack of insulin or the lack of normal response to insulin causes excess sugars to build up in the blood instead of going into the tissue cells. As a result, high blood sugar (hyperglycemia) develops. The effect of high sugar (glucose) levels can cause many complications. Type 2 diabetes was also previously called adult-onset diabetes but it can occur at any age.  RISK FACTORS  A person is predisposed to developing type 2 diabetes if someone in the family has the disease and also has one or more of the following primary risk factors:  Overweight.  An inactive lifestyle.  A history of consistently eating high-calorie foods. Maintaining a normal weight and regular physical activity can reduce the chance of developing type 2 diabetes. SYMPTOMS  A person with type 2 diabetes may not show symptoms initially. The symptoms of type 2 diabetes appear slowly. The symptoms include:  Increased thirst (polydipsia).  Increased urination (polyuria).  Increased urination during the night (nocturia).  Weight loss. This weight loss may be rapid.  Frequent, recurring infections.  Tiredness (fatigue).  Weakness.  Vision changes, such as blurred vision.  Fruity smell to your breath.  Abdominal pain.  Nausea or vomiting.  Cuts or bruises which are slow to heal.  Tingling or numbness in the hands or feet. DIAGNOSIS Type 2 diabetes is frequently not diagnosed until complications of diabetes are present. Type 2 diabetes is diagnosed when symptoms or complications are present and when blood  glucose levels are increased. Your blood glucose level may be checked by one or more of the following blood tests:  A fasting blood glucose test. You will not be allowed to eat for at least 8 hours before a blood sample is taken.  A random blood glucose test. Your blood glucose is checked at any time of the day regardless of when you ate.  A hemoglobin A1c blood glucose test. A hemoglobin A1c test provides information about blood glucose control over the previous 3 months.  An oral glucose tolerance test (OGTT). Your blood glucose is measured after you have not eaten (fasted) for 2 hours and then after you drink a glucose-containing beverage. TREATMENT   You may need to take insulin or diabetes medicine daily to keep blood glucose levels in the desired range.  You will need to match insulin dosing with exercise and healthy food choices. The treatment goal is to maintain the before meal blood sugar (preprandial glucose) level at 70 130 mg/dL. HOME CARE INSTRUCTIONS   Have your hemoglobin A1c level checked twice a year.  Perform daily blood glucose monitoring as directed by your caregiver.  Monitor urine ketones when you are ill and as directed by your caregiver.  Take your diabetes medicine or insulin as directed by your caregiver to maintain your blood glucose levels in the desired range.  Never run out of diabetes medicine or insulin. It is needed every day.  Adjust insulin based on your intake of carbohydrates. Carbohydrates can raise blood glucose levels but need to be included in your diet. Carbohydrates provide vitamins, minerals, and fiber which are an essential part of   a healthy diet. Carbohydrates are found in fruits, vegetables, whole grains, dairy products, legumes, and foods containing added sugars.    Eat healthy foods. Alternate 3 meals with 3 snacks.  Lose weight if overweight.  Carry a medical alert card or wear your medical alert jewelry.  Carry a 15 gram  carbohydrate snack with you at all times to treat low blood glucose (hypoglycemia). Some examples of 15 gram carbohydrate snacks include:  Glucose tablets, 3 or 4   Glucose gel, 15 gram tube  Raisins, 2 tablespoons (24 grams)  Jelly beans, 6  Animal crackers, 8  Regular pop, 4 ounces (120 mL)  Gummy treats, 9  Recognize hypoglycemia. Hypoglycemia occurs with blood glucose levels of 70 mg/dL and below. The risk for hypoglycemia increases when fasting or skipping meals, during or after intense exercise, and during sleep. Hypoglycemia symptoms can include:  Tremors or shakes.  Decreased ability to concentrate.  Sweating.  Increased heart rate.  Headache.  Dry mouth.  Hunger.  Irritability.  Anxiety.  Restless sleep.  Altered speech or coordination.  Confusion.  Treat hypoglycemia promptly. If you are alert and able to safely swallow, follow the 15:15 rule:  Take 15 20 grams of rapid-acting glucose or carbohydrate. Rapid-acting options include glucose gel, glucose tablets, or 4 ounces (120 mL) of fruit juice, regular soda, or low fat milk.  Check your blood glucose level 15 minutes after taking the glucose.  Take 15 20 grams more of glucose if the repeat blood glucose level is still 70 mg/dL or below.  Eat a meal or snack within 1 hour once blood glucose levels return to normal.    Be alert to polyuria and polydipsia which are early signs of hyperglycemia. An early awareness of hyperglycemia allows for prompt treatment. Treat hyperglycemia as directed by your caregiver.  Engage in at least 150 minutes of moderate-intensity physical activity a week, spread over at least 3 days of the week or as directed by your caregiver. In addition, you should engage in resistance exercise at least 2 times a week or as directed by your caregiver.  Adjust your medicine and food intake as needed if you start a new exercise or sport.  Follow your sick day plan at any time you  are unable to eat or drink as usual.  Avoid tobacco use.  Limit alcohol intake to no more than 1 drink per day for nonpregnant women and 2 drinks per day for men. You should drink alcohol only when you are also eating food. Talk with your caregiver whether alcohol is safe for you. Tell your caregiver if you drink alcohol several times a week.  Follow up with your caregiver regularly.  Schedule an eye exam soon after the diagnosis of type 2 diabetes and then annually.  Perform daily skin and foot care. Examine your skin and feet daily for cuts, bruises, redness, nail problems, bleeding, blisters, or sores. A foot exam by a caregiver should be done annually.  Brush your teeth and gums at least twice a day and floss at least once a day. Follow up with your dentist regularly.  Share your diabetes management plan with your workplace or school.  Stay up-to-date with immunizations.  Learn to manage stress.  Obtain ongoing diabetes education and support as needed.  Participate in, or seek rehabilitation as needed to maintain or improve independence and quality of life. Request a physical or occupational therapy referral if you are having foot or hand numbness or difficulties with grooming,   dressing, eating, or physical activity. SEEK MEDICAL CARE IF:   You are unable to eat food or drink fluids for more than 6 hours.  You have nausea and vomiting for more than 6 hours.  Your blood glucose level is over 240 mg/dL.  There is a change in mental status.  You develop an additional serious illness.  You have diarrhea for more than 6 hours.  You have been sick or have had a fever for a couple of days and are not getting better.  You have pain during any physical activity.  SEEK IMMEDIATE MEDICAL CARE IF:  You have difficulty breathing.  You have moderate to large ketone levels. MAKE SURE YOU:  Understand these instructions.  Will watch your condition.  Will get help right away if  you are not doing well or get worse. Document Released: 07/24/2005 Document Revised: 04/17/2012 Document Reviewed: 02/20/2012 [employer] Patient Information 2014 [employer].  

## 2013-12-23 NOTE — Progress Notes (Signed)
Pre visit review using our clinic review tool, if applicable. No additional management support is needed unless otherwise documented below in the visit note. 

## 2013-12-23 NOTE — Assessment & Plan Note (Signed)
She is doing well on simvastatin

## 2013-12-23 NOTE — Progress Notes (Signed)
Subjective:    Patient ID: Audrey Reed, female    DOB: 1917/11/22, 80 y.o.   MRN: [id number]  Diabetes She presents for her follow-up diabetic visit. She has type 2 diabetes mellitus. Her disease course has been fluctuating. There are no hypoglycemic associated symptoms. Pertinent negatives for diabetes include no blurred vision, no chest pain, no fatigue, no foot paresthesias, no foot ulcerations, no polydipsia, no polyphagia, no polyuria, no visual change, no weakness and no weight loss. There are no hypoglycemic complications. Diabetic complications include nephropathy. Current diabetic treatment includes oral agent (monotherapy). She is compliant with treatment all of the time. Her weight is stable. When asked about meal planning, she reported none. She has not had a previous visit with a dietician. She never participates in exercise. There is no change in her home blood glucose trend. An ACE inhibitor/angiotensin II receptor blocker is being taken. She does not see a podiatrist.Eye exam is not current.      Review of Systems  Constitutional: Negative.  Negative for fever, chills, weight loss, diaphoresis, appetite change and fatigue.  HENT: Negative.   Eyes: Negative.  Negative for blurred vision.  Respiratory: Negative.  Negative for cough, choking, chest tightness, shortness of breath and stridor.   Cardiovascular: Negative.  Negative for chest pain, palpitations and leg swelling.  Gastrointestinal: Positive for diarrhea. Negative for nausea, vomiting, abdominal pain, constipation, blood in stool, abdominal distention, anal bleeding and rectal pain.  Endocrine: Negative.  Negative for polydipsia, polyphagia and polyuria.  Genitourinary: Negative.  Negative for dysuria, urgency, frequency, hematuria, flank pain and difficulty urinating.  Musculoskeletal: Positive for arthralgias. Negative for back pain, gait problem, joint swelling, myalgias, neck pain and neck stiffness.  Skin:  Negative.   Allergic/Immunologic: Negative.   Neurological: Negative.  Negative for weakness.  Hematological: Negative.  Negative for adenopathy. Does not bruise/bleed easily.  Psychiatric/Behavioral: Negative.        Objective:   Physical Exam  Vitals reviewed. Constitutional: She is oriented to person, place, and time. She appears well-developed and well-nourished. No distress.  HENT:  Head: Normocephalic and atraumatic.  Mouth/Throat: Oropharynx is clear and moist. No oropharyngeal exudate.  Eyes: Conjunctivae are normal. Right eye exhibits no discharge. Left eye exhibits no discharge. No scleral icterus.  Neck: Normal range of motion. Neck supple. No JVD present. No tracheal deviation present. No thyromegaly present.  Cardiovascular: Normal rate, regular rhythm, S1 normal, S2 normal and intact distal pulses.  Exam reveals no gallop, no S3, no S4 and no friction rub.   Murmur heard.  Decrescendo systolic murmur is present with a grade of 1/6   No diastolic murmur is present  Pulses:      Carotid pulses are 1+ on the right side, and 1+ on the left side.      Radial pulses are 1+ on the right side, and 1+ on the left side.       Femoral pulses are 1+ on the right side, and 1+ on the left side.      Popliteal pulses are 1+ on the right side, and 1+ on the left side.       Dorsalis pedis pulses are 1+ on the right side, and 1+ on the left side.       Posterior tibial pulses are 1+ on the right side, and 1+ on the left side.  Pulmonary/Chest: Effort normal and breath sounds normal. No stridor. No respiratory distress. She has no wheezes. She has no rales. She exhibits  no tenderness.  Abdominal: Soft. Bowel sounds are normal. She exhibits no distension and no mass. There is no tenderness. There is no rebound and no guarding.  Musculoskeletal: Normal range of motion. She exhibits no edema and no tenderness.  Lymphadenopathy:    She has no cervical adenopathy.  Neurological: She is  oriented to person, place, and time.  Skin: Skin is warm and dry. No rash noted. She is not diaphoretic. No erythema. No pallor.  Psychiatric: She has a normal mood and affect. Her behavior is normal. Judgment and thought content normal.     Lab Results  Component Value Date   WBC 8.4 07/28/2013   HGB 12.6 07/28/2013   HCT 40.3 07/28/2013   PLT 181.0 05/12/2013   GLUCOSE 130* 05/12/2013   CHOL 172 05/12/2013   TRIG 74.0 05/12/2013   HDL 83.70 05/12/2013   LDLCALC 74 05/12/2013   ALT 14 05/12/2013   AST 17 05/12/2013   NA 137 05/12/2013   K 5.2* 05/12/2013   CL 103 05/12/2013   CREATININE 1.6* 05/12/2013   BUN 56* 05/12/2013   CO2 29 05/12/2013   TSH 2.11 05/12/2013   INR 1.31 08/04/2012   HGBA1C 6.0 05/12/2013       Assessment & Plan:

## 2013-12-23 NOTE — Assessment & Plan Note (Signed)
Will stop the metformin and will recheck her renal function today

## 2013-12-23 NOTE — Assessment & Plan Note (Signed)
Her last A1C was 6.0 so I have advised her to stop the metformin

## 2013-12-30 ENCOUNTER — Other Ambulatory Visit: Payer: Self-pay | Admitting: Pulmonary Disease

## 2014-01-17 ENCOUNTER — Other Ambulatory Visit: Payer: Self-pay | Admitting: Pulmonary Disease

## 2014-01-20 ENCOUNTER — Other Ambulatory Visit: Payer: Self-pay | Admitting: Pulmonary Disease

## 2014-01-22 DIAGNOSIS — E119 Type 2 diabetes mellitus without complications: Secondary | ICD-10-CM | POA: Diagnosis not present

## 2014-01-22 LAB — HM DIABETES EYE EXAM

## 2014-01-26 ENCOUNTER — Encounter: Payer: Self-pay | Admitting: Podiatry

## 2014-01-26 ENCOUNTER — Ambulatory Visit (INDEPENDENT_AMBULATORY_CARE_PROVIDER_SITE_OTHER): Payer: Medicare Other | Admitting: Podiatry

## 2014-01-26 ENCOUNTER — Telehealth: Payer: Self-pay | Admitting: Internal Medicine

## 2014-01-26 VITALS — BP 160/75 | HR 51 | Resp 15 | Ht 62.0 in | Wt 130.0 lb

## 2014-01-26 DIAGNOSIS — B351 Tinea unguium: Secondary | ICD-10-CM

## 2014-01-26 DIAGNOSIS — M79609 Pain in unspecified limb: Secondary | ICD-10-CM

## 2014-01-26 DIAGNOSIS — M79673 Pain in unspecified foot: Secondary | ICD-10-CM

## 2014-01-26 NOTE — Telephone Encounter (Signed)
Pt son came in to request Rx refill be submitted on: lisinopril and simvastatin. He states that pt is almost out of these medications. Pt son would like the requests sent to [facility] on [address]. Please contact pt son when requests are completed.

## 2014-01-27 ENCOUNTER — Other Ambulatory Visit: Payer: Self-pay

## 2014-01-27 MED ORDER — CARVEDILOL 12.5 MG PO TABS: ORAL_TABLET | ORAL | Status: DC

## 2014-01-27 MED ORDER — SIMVASTATIN 20 MG PO TABS: ORAL_TABLET | ORAL | Status: DC

## 2014-01-27 MED ORDER — LISINOPRIL 40 MG PO TABS: ORAL_TABLET | ORAL | Status: DC

## 2014-01-27 NOTE — Progress Notes (Signed)
Patient ID: Audrey Reed, female   DOB: 11-15-1917, 80 y.o.   MRN: [id number]  Subjective: Orientated x3 female presents complaining of painful toenails  Objective: Incurvated, brittle, hypertrophic, discolored toenails x10  Assessment: Symptomatic onychomycoses x10  Plan: Nails x10 are debrided without a bleeding  Reappoint x3 months

## 2014-03-21 ENCOUNTER — Ambulatory Visit (INDEPENDENT_AMBULATORY_CARE_PROVIDER_SITE_OTHER): Payer: Medicare Other | Admitting: Family Medicine

## 2014-03-21 ENCOUNTER — Ambulatory Visit (INDEPENDENT_AMBULATORY_CARE_PROVIDER_SITE_OTHER): Payer: Medicare Other

## 2014-03-21 VITALS — BP 162/88 | HR 76 | Temp 97.8°F | Resp 16 | Ht 62.0 in | Wt 128.6 lb

## 2014-03-21 DIAGNOSIS — N289 Disorder of kidney and ureter, unspecified: Secondary | ICD-10-CM | POA: Diagnosis not present

## 2014-03-21 DIAGNOSIS — I2510 Atherosclerotic heart disease of native coronary artery without angina pectoris: Secondary | ICD-10-CM | POA: Diagnosis not present

## 2014-03-21 DIAGNOSIS — E1165 Type 2 diabetes mellitus with hyperglycemia: Secondary | ICD-10-CM | POA: Diagnosis not present

## 2014-03-21 DIAGNOSIS — E1129 Type 2 diabetes mellitus with other diabetic kidney complication: Secondary | ICD-10-CM | POA: Diagnosis not present

## 2014-03-21 DIAGNOSIS — R0602 Shortness of breath: Secondary | ICD-10-CM

## 2014-03-21 DIAGNOSIS — J3089 Other allergic rhinitis: Secondary | ICD-10-CM

## 2014-03-21 DIAGNOSIS — I10 Essential (primary) hypertension: Secondary | ICD-10-CM

## 2014-03-21 DIAGNOSIS — I872 Venous insufficiency (chronic) (peripheral): Secondary | ICD-10-CM | POA: Diagnosis not present

## 2014-03-21 LAB — POCT CBC
Granulocyte percent: 62.8 %G (ref 37–80)
HEMATOCRIT: 38.1 % (ref 37.7–47.9)
HEMOGLOBIN: 12.5 g/dL (ref 12.2–16.2)
Lymph, poc: 2.6 (ref 0.6–3.4)
MCH, POC: 28.3 pg (ref 27–31.2)
MCHC: 32.8 g/dL (ref 31.8–35.4)
MCV: 86.4 fL (ref 80–97)
MID (cbc): 0.4 (ref 0–0.9)
MPV: 7.4 fL (ref 0–99.8)
POC GRANULOCYTE: 5.1 (ref 2–6.9)
POC LYMPH PERCENT: 32 %L (ref 10–50)
POC MID %: 5.2 %M (ref 0–12)
Platelet Count, POC: 152 10*3/uL (ref 142–424)
RBC: 4.41 M/uL (ref 4.04–5.48)
RDW, POC: 16.2 %
WBC: 8.1 10*3/uL (ref 4.6–10.2)

## 2014-03-21 MED ORDER — FLUTICASONE PROPIONATE 50 MCG/ACT NA SUSP: 2.0000 | Freq: Every day | NASAL | Status: DC

## 2014-03-21 MED ORDER — LORATADINE 10 MG PO TABS: 10.0000 mg | ORAL_TABLET | Freq: Every day | ORAL | Status: DC

## 2014-03-21 NOTE — Progress Notes (Addendum)
Patient ID: BRETT SOZA, female   DOB: 11-16-1917, 80 y.o.   MRN: [id number]   Subjective:  This chart was scribed for Reginia Forts, MD by Donato Schultz, Medical Scribe. This patient was seen in Room 12 and the patient's care was started at 4:06 PM.   Patient ID: Melvia Heaps, female    DOB: [birth year], 80 y.o.   MRN: [id number]  03/21/2014  Shortness of Breath  HPI HPI Comments: MAKENNAH OMURA is a 80 y.o. female with a history of CAD, DMII who presents to the [facility] complaining of SOB that started last night at around 3 AM.  She is not sure what caused her symptoms.  She tried sitting up in the bed with a pillow propped up behind her with some relief to her symptoms.  Laying down aggravated her symptoms.  She used a nasal spray today and nasal strips last night with some relief to her symptoms.  She states that today her SOB has slightly improved and she will only experience symptoms intermittently.  She denies fever, chills, nausea, worsening leg swelling, chest pain, cough, and diaphoresis as associated symptoms.  She lists postnasal drip and sinus congestion as associated symptoms.  She gets up every hour at night to use the bathroom due to chronic diuretic use.  She does not have a history of MI, heart failure, or COPD.  She wears a brace on her left leg regularly for fear that her knee will pop out of place if she does not.  She quit smoking in 1989.  Dr. Ronnald Ramp is her PCP.   Review of Systems  Constitutional: Negative for fever, chills, diaphoresis and fatigue.  HENT: Positive for congestion, postnasal drip and sinus pressure. Negative for ear pain, rhinorrhea and sore throat.   Respiratory: Positive for shortness of breath. Negative for cough, wheezing and stridor.   Cardiovascular: Positive for leg swelling. Negative for chest pain and palpitations.  Gastrointestinal: Negative for nausea and abdominal pain.    Past Medical History  Diagnosis Date  . HTN  (hypertension)   . CAD (coronary artery disease)   . Venous insufficiency   . Hypercholesterolemia   . DM (diabetes mellitus)   . Diverticulosis of colon   . Colon polyp 06/26/03    9 mm sessile polyp 110 cm from anus; not retrieved  . Adenocarcinoma of breast   . DJD (degenerative joint disease)   . Osteoporosis   . Anxiety disorder   . Dementia   . Dermatophytosis of nail 03/31/2013   Past Surgical History  Procedure Laterality Date  . Cataract extraction    . Mastectomy      for breast cancer   Allergies  Allergen Reactions  . Metformin And Related     diarrhea  . Nitrofurantoin     REACTION: unsure of reaction--happened long ago  . Tramadol Nausea Only  . Codeine Nausea And Vomiting, Swelling and Rash   Current Outpatient Prescriptions  Medication Sig Dispense Refill  . acetaminophen (TYLENOL) 325 MG tablet Take 2 tablets (650 mg total) by mouth every 6 (six) hours as needed (or Fever >/= 101).      Marland Kitchen alendronate (FOSAMAX) 70 MG tablet TAKE 1 TABLET EVERY 7 DAYS. TAKE WITH A FULL GLASS OF WATER ON AN EMPTY STOMACH.  3 tablet  3  . carvedilol (COREG) 12.5 MG tablet TAKE 1 TABLET TWICE A DAY  180 tablet  0  . Cholecalciferol (VITAMIN D3) 1000 UNITS CAPS  Take 2 capsules by mouth daily.      . cholestyramine light (PREVALITE) 4 GM/DOSE powder Take 4 g by mouth as needed.      . furosemide (LASIX) 40 MG tablet take 1 tablet by mouth every morning  90 tablet  0  . lisinopril (PRINIVIL,ZESTRIL) 40 MG tablet take 1 tablet by mouth once daily  90 tablet  3  . loperamide (IMODIUM) 2 MG capsule Take 2 mg by mouth 2 (two) times daily. As needed for diarrhea      . minoxidil (LONITEN) 10 MG tablet take 1 tablet by mouth once daily  30 tablet  2  . Multiple Vitamins-Minerals (CENTRUM SILVER ADULT 50+ PO) Take 1 tablet by mouth daily.      Marland Kitchen saccharomyces boulardii (FLORASTOR) 250 MG capsule Take 1 capsule (250 mg total) by mouth daily.  30 capsule  11  . simvastatin (ZOCOR) 20 MG  tablet take 1 tablet by mouth at bedtime  90 tablet  3  . feeding supplement (ENSURE) PUDG Take 1 Container by mouth 3 (three) times daily between meals.      . fluticasone (FLONASE) 50 MCG/ACT nasal spray Place 2 sprays into both nostrils daily.  16 g  6  . loratadine (CLARITIN) 10 MG tablet Take 1 tablet (10 mg total) by mouth daily.  30 tablet  5   No current facility-administered medications for this visit.   History   Social History  . Marital Status: Widowed    Spouse Name: N/A    Number of Children: 1  . Years of Education: N/A   Occupational History  . retired from [employer] after 30+ years    Social History Main Topics  . Smoking status: Former Smoker    Quit date: 08/08/1987  . Smokeless tobacco: Never Used  . Alcohol Use: No  . Drug Use: No  . Sexual Activity: Not Currently   Other Topics Concern  . Not on file   Social History Narrative   Husband died 20+ years ago   2 sons - one died age 10 w/ kidney cancer   1 son alive, Sonia Side - he is next of kin   Caffeine use: 2-3 cups per day   4 siblings, all sisters - 2 sisters deceased   1 sister is our patient = Koleen Distance    Objective:  BP 162/88  Pulse 76  Temp(Src) 97.8 F (36.6 C) (Oral)  Resp 16  Ht 5\' 2"  (1.575 m)  Wt 128 lb 9.6 oz (58.333 kg)  BMI 23.52 kg/m2  SpO2 96%  Physical Exam  Nursing note and vitals reviewed. Constitutional: She is oriented to person, place, and time. She appears well-developed and well-nourished. No distress.  HENT:  Head: Normocephalic and atraumatic.  Right Ear: External ear normal.  Left Ear: External ear normal.  Nose: Nose normal.  Mouth/Throat: Oropharynx is clear and moist. No oropharyngeal exudate.  Some drainage.   Eyes: Conjunctivae and EOM are normal. Pupils are equal, round, and reactive to light.  Neck: Normal range of motion. Neck supple. Carotid bruit is not present. No thyromegaly present.  Cardiovascular: Normal rate, regular rhythm and intact distal  pulses.  Exam reveals no gallop and no friction rub.   Murmur heard.  Systolic murmur is present with a grade of 2/6  Pulmonary/Chest: Effort normal and breath sounds normal. No respiratory distress. She has no wheezes. She has no rales.  Abdominal: Soft. Bowel sounds are normal. She exhibits no distension and no  mass. There is no tenderness. There is no rebound and no guarding.  Musculoskeletal: Normal range of motion.  1+ edema to knees bilaterally.  Lymphadenopathy:    She has no cervical adenopathy.  Neurological: She is alert and oriented to person, place, and time. No cranial nerve deficit.  Skin: Skin is warm and dry. No rash noted. She is not diaphoretic. No erythema. No pallor.  Psychiatric: She has a normal mood and affect. Her behavior is normal.    Results for orders placed in visit on 03/21/14  POCT CBC      Result Value Ref Range   WBC 8.1  4.6 - 10.2 K/uL   Lymph, poc 2.6  0.6 - 3.4   POC LYMPH PERCENT 32.0  10 - 50 %L   MID (cbc) 0.4  0 - 0.9   POC MID % 5.2  0 - 12 %M   POC Granulocyte 5.1  2 - 6.9   Granulocyte percent 62.8  37 - 80 %G   RBC 4.41  4.04 - 5.48 M/uL   Hemoglobin 12.5  12.2 - 16.2 g/dL   HCT, POC 38.1  37.7 - 47.9 %   MCV 86.4  80 - 97 fL   MCH, POC 28.3  27 - 31.2 pg   MCHC 32.8  31.8 - 35.4 g/dL   RDW, POC 16.2     Platelet Count, POC 152  142 - 424 K/uL   MPV 7.4  0 - 99.8 fL   [facility] preliminary x-ray report read by Dr. Tamala Julian: CXR: NAD; no infiltrate or pulmonary edema.  EKG: NSR; no acute ST changes  Assessment & Plan:   1. Shortness of breath   2. VENOUS INSUFFICIENCY   3. Type II or unspecified type diabetes mellitus with renal manifestations, uncontrolled   4. Renal insufficiency   5. HYPERTENSION   6. CORONARY ARTERY DISEASE   7. Other allergic rhinitis    1. Shortness of breath: New.  Normal pulse oximetry in office.  CXR without acute findings.  Benign exam in office.  Complaining of nasal congestion and post-nasal drip which  may also be contributing to symptoms.  Present to ED for acute worsening. 2.  Allergic Rhinitis: uncontrolled; rx for Flonase and Claritin provided. 3.  Venous insufficiency: chronic without recent worsening.  No evidence of CHF new onset; weight is down. 4.  DMII: stable; obtain labs. 5.  Renal insufficiency: chronic; obtain labs. No evidence of volume overload. 6.  HTN: stable for age. 7.  CAD: stable; no chest pain with SOB however cannot rule out anginal equivalent.  Stable EKG and asymptomatic currently.  Meds ordered this encounter  Medications  . fluticasone (FLONASE) 50 MCG/ACT nasal spray    Sig: Place 2 sprays into both nostrils daily.    Dispense:  16 g    Refill:  6  . loratadine (CLARITIN) 10 MG tablet    Sig: Take 1 tablet (10 mg total) by mouth daily.    Dispense:  30 tablet    Refill:  5    No Follow-up on file.   I personally performed the services described in this documentation, which was scribed in my presence.  The recorded information has been reviewed and is accurate.   Reginia Forts, M.D.  [facility] [address] [place], [place]  [phone] phone [phone] fax

## 2014-03-22 LAB — COMPREHENSIVE METABOLIC PANEL
ALT: 9 U/L (ref 0–35)
AST: 14 U/L (ref 0–37)
Albumin: 4.2 g/dL (ref 3.5–5.2)
Alkaline Phosphatase: 26 U/L — ABNORMAL LOW (ref 39–117)
BUN: 58 mg/dL — ABNORMAL HIGH (ref 6–23)
CALCIUM: 9.9 mg/dL (ref 8.4–10.5)
CHLORIDE: 103 meq/L (ref 96–112)
CO2: 25 meq/L (ref 19–32)
Creat: 1.56 mg/dL — ABNORMAL HIGH (ref 0.50–1.10)
GLUCOSE: 137 mg/dL — AB (ref 70–99)
Potassium: 5.4 mEq/L — ABNORMAL HIGH (ref 3.5–5.3)
SODIUM: 139 meq/L (ref 135–145)
TOTAL PROTEIN: 6.5 g/dL (ref 6.0–8.3)
Total Bilirubin: 0.7 mg/dL (ref 0.2–1.2)

## 2014-03-24 ENCOUNTER — Other Ambulatory Visit: Payer: Self-pay | Admitting: Pulmonary Disease

## 2014-03-27 ENCOUNTER — Other Ambulatory Visit: Payer: Self-pay | Admitting: Pulmonary Disease

## 2014-03-30 ENCOUNTER — Telehealth: Payer: Self-pay | Admitting: Internal Medicine

## 2014-03-30 MED ORDER — FUROSEMIDE 40 MG PO TABS: ORAL_TABLET | ORAL | Status: DC

## 2014-03-30 NOTE — Telephone Encounter (Signed)
Pt son came by to request Rx refill for LASIX. Pt son states the [facility] pharmacy states they did not have approval. Please contact pt son when request is reviewed.

## 2014-03-30 NOTE — Telephone Encounter (Signed)
RX approved x 1 year and sent e-script to pharmacy on file.

## 2014-04-06 ENCOUNTER — Other Ambulatory Visit: Payer: Self-pay | Admitting: Pulmonary Disease

## 2014-04-09 ENCOUNTER — Telehealth: Payer: Self-pay | Admitting: Internal Medicine

## 2014-04-09 ENCOUNTER — Other Ambulatory Visit: Payer: Self-pay

## 2014-04-09 MED ORDER — MINOXIDIL 10 MG PO TABS: ORAL_TABLET | ORAL | Status: DC

## 2014-04-09 NOTE — Telephone Encounter (Signed)
Pt son came by stating pharmacy is saying they do not have approval for the Rx refill request for minoxidil (LONITEN) tabs. Jasmine has submitted request.   Request completed.

## 2014-04-28 ENCOUNTER — Encounter: Payer: Self-pay | Admitting: Internal Medicine

## 2014-04-28 ENCOUNTER — Other Ambulatory Visit (INDEPENDENT_AMBULATORY_CARE_PROVIDER_SITE_OTHER): Payer: Medicare Other

## 2014-04-28 ENCOUNTER — Ambulatory Visit (INDEPENDENT_AMBULATORY_CARE_PROVIDER_SITE_OTHER): Payer: Medicare Other | Admitting: Internal Medicine

## 2014-04-28 VITALS — BP 158/82 | HR 60 | Temp 97.6°F | Resp 16 | Ht 62.0 in | Wt 128.0 lb

## 2014-04-28 DIAGNOSIS — E7800 Pure hypercholesterolemia, unspecified: Secondary | ICD-10-CM | POA: Diagnosis not present

## 2014-04-28 DIAGNOSIS — E1129 Type 2 diabetes mellitus with other diabetic kidney complication: Secondary | ICD-10-CM

## 2014-04-28 DIAGNOSIS — I2510 Atherosclerotic heart disease of native coronary artery without angina pectoris: Secondary | ICD-10-CM

## 2014-04-28 DIAGNOSIS — E1165 Type 2 diabetes mellitus with hyperglycemia: Principal | ICD-10-CM

## 2014-04-28 DIAGNOSIS — M159 Polyosteoarthritis, unspecified: Secondary | ICD-10-CM

## 2014-04-28 DIAGNOSIS — I10 Essential (primary) hypertension: Secondary | ICD-10-CM

## 2014-04-28 DIAGNOSIS — Z23 Encounter for immunization: Secondary | ICD-10-CM

## 2014-04-28 DIAGNOSIS — J3089 Other allergic rhinitis: Secondary | ICD-10-CM

## 2014-04-28 DIAGNOSIS — N289 Disorder of kidney and ureter, unspecified: Secondary | ICD-10-CM

## 2014-04-28 LAB — COMPREHENSIVE METABOLIC PANEL
ALT: 12 U/L (ref 0–35)
AST: 21 U/L (ref 0–37)
Albumin: 4.4 g/dL (ref 3.5–5.2)
Alkaline Phosphatase: 34 U/L — ABNORMAL LOW (ref 39–117)
BILIRUBIN TOTAL: 1.1 mg/dL (ref 0.2–1.2)
BUN: 54 mg/dL — ABNORMAL HIGH (ref 6–23)
CALCIUM: 9.7 mg/dL (ref 8.4–10.5)
CHLORIDE: 104 meq/L (ref 96–112)
CO2: 24 meq/L (ref 19–32)
CREATININE: 1.4 mg/dL — AB (ref 0.4–1.2)
GFR: 36.58 mL/min — AB (ref >60.00)
GLUCOSE: 121 mg/dL — AB (ref 70–99)
Potassium: 4.6 mEq/L (ref 3.5–5.1)
Sodium: 137 mEq/L (ref 135–145)
Total Protein: 7.5 g/dL (ref 6.0–8.3)

## 2014-04-28 LAB — CBC WITH DIFFERENTIAL/PLATELET
BASOS ABS: 0 10*3/uL (ref 0.0–0.1)
Basophils Relative: 0.5 % (ref 0.0–3.0)
EOS PCT: 6.7 % — AB (ref 0.0–5.0)
Eosinophils Absolute: 0.5 10*3/uL (ref 0.0–0.7)
HEMATOCRIT: 38.7 % (ref 36.0–46.0)
Hemoglobin: 13.1 g/dL (ref 12.0–15.0)
LYMPHS ABS: 1.9 10*3/uL (ref 0.7–4.0)
LYMPHS PCT: 25.1 % (ref 12.0–46.0)
MCHC: 33.9 g/dL (ref 30.0–36.0)
MCV: 85.5 fl (ref 78.0–100.0)
MONOS PCT: 8.3 % (ref 3.0–12.0)
Monocytes Absolute: 0.6 10*3/uL (ref 0.1–1.0)
Neutro Abs: 4.5 10*3/uL (ref 1.4–7.7)
Neutrophils Relative %: 59.4 % (ref 43.0–77.0)
Platelets: 161 10*3/uL (ref 150.0–400.0)
RBC: 4.53 Mil/uL (ref 3.87–5.11)
RDW: 15.3 % (ref 11.5–15.5)
WBC: 7.6 10*3/uL (ref 4.0–10.5)

## 2014-04-28 LAB — TSH: TSH: 2.53 u[IU]/mL (ref 0.35–4.50)

## 2014-04-28 LAB — LIPID PANEL
Cholesterol: 164 mg/dL (ref 0–200)
HDL: 75.2 mg/dL (ref >39.00)
LDL Cholesterol: 76 mg/dL (ref 0–99)
NonHDL: 88.8
Total CHOL/HDL Ratio: 2
Triglycerides: 65 mg/dL (ref 0.0–149.0)
VLDL: 13 mg/dL (ref 0.0–40.0)

## 2014-04-28 LAB — HEMOGLOBIN A1C: Hgb A1c MFr Bld: 5.7 % (ref 4.6–6.5)

## 2014-04-28 MED ORDER — FLUTICASONE PROPIONATE 50 MCG/ACT NA SUSP: 2.0000 | Freq: Every day | NASAL | Status: DC

## 2014-04-28 MED ORDER — VITAMIN D3 25 MCG (1000 UT) PO CAPS: 2.0000 | ORAL_CAPSULE | Freq: Every day | ORAL | Status: DC

## 2014-04-28 MED ORDER — FUROSEMIDE 40 MG PO TABS: ORAL_TABLET | ORAL | Status: DC

## 2014-04-28 MED ORDER — LISINOPRIL 40 MG PO TABS: ORAL_TABLET | ORAL | Status: DC

## 2014-04-28 MED ORDER — SIMVASTATIN 20 MG PO TABS: ORAL_TABLET | ORAL | Status: DC

## 2014-04-28 MED ORDER — MINOXIDIL 10 MG PO TABS: ORAL_TABLET | ORAL | Status: DC

## 2014-04-28 MED ORDER — CARVEDILOL 12.5 MG PO TABS: ORAL_TABLET | ORAL | Status: DC

## 2014-04-28 NOTE — Assessment & Plan Note (Signed)
Her BP is well controlled Her lytes and renal function are stable 

## 2014-04-28 NOTE — Patient Instructions (Signed)

## 2014-04-28 NOTE — Assessment & Plan Note (Signed)
She has achieved her LDL goal and is doing well on Zocor

## 2014-04-28 NOTE — Assessment & Plan Note (Signed)
Her blood sugars are well controlled She does not need medication

## 2014-04-28 NOTE — Progress Notes (Signed)
Subjective:    Patient ID: Audrey Reed, female    DOB: 04-05-1918, 80 y.o.   MRN: [id number]  Hypertension This is a chronic problem. The current episode started more than 1 year ago. The problem is unchanged. The problem is controlled. Pertinent negatives include no anxiety, blurred vision, chest pain, headaches, malaise/fatigue, neck pain, orthopnea, palpitations, peripheral edema, PND, shortness of breath or sweats. Past treatments include ACE inhibitors, diuretics, direct vasodilators and beta blockers. The current treatment provides moderate improvement. Compliance problems include diet and exercise.  Hypertensive end-organ damage includes kidney disease and CAD/MI. Identifiable causes of hypertension include chronic renal disease.      Review of Systems  Constitutional: Negative.  Negative for fever, chills, malaise/fatigue, diaphoresis, appetite change and fatigue.  HENT: Negative.   Eyes: Negative.  Negative for blurred vision.  Respiratory: Negative.  Negative for cough, choking, chest tightness, shortness of breath and stridor.   Cardiovascular: Negative.  Negative for chest pain, palpitations, orthopnea, leg swelling and PND.  Gastrointestinal: Negative.  Negative for nausea, vomiting, abdominal pain, diarrhea, constipation and blood in stool.  Endocrine: Negative.   Genitourinary: Negative.   Musculoskeletal: Positive for arthralgias. Negative for back pain, gait problem and neck pain.  Skin: Negative.  Negative for rash.  Allergic/Immunologic: Negative.   Neurological: Negative.  Negative for dizziness, syncope, speech difficulty, weakness, light-headedness, numbness and headaches.  Hematological: Negative.  Negative for adenopathy. Does not bruise/bleed easily.  Psychiatric/Behavioral: Negative.        Objective:   Physical Exam  Vitals reviewed. Constitutional: She is oriented to person, place, and time. She appears well-developed and well-nourished. No distress.    HENT:  Head: Normocephalic and atraumatic.  Mouth/Throat: Oropharynx is clear and moist. No oropharyngeal exudate.  Eyes: Conjunctivae are normal. Right eye exhibits no discharge. Left eye exhibits no discharge. No scleral icterus.  Neck: Normal range of motion. Neck supple. No JVD present. No tracheal deviation present. No thyromegaly present.  Cardiovascular: Normal rate, regular rhythm, S1 normal, S2 normal and intact distal pulses.  Exam reveals no gallop and no friction rub.   Murmur heard.  Decrescendo systolic murmur is present with a grade of 1/6   No diastolic murmur is present  Pulmonary/Chest: Effort normal and breath sounds normal. No stridor. No respiratory distress. She has no wheezes. She has no rales. She exhibits no tenderness.  Abdominal: Soft. Bowel sounds are normal. She exhibits no distension and no mass. There is no tenderness. There is no rebound and no guarding.  Musculoskeletal: Normal range of motion. She exhibits no edema and no tenderness.  Lymphadenopathy:    She has no cervical adenopathy.  Neurological: She is oriented to person, place, and time.  Skin: Skin is warm and dry. No rash noted. She is not diaphoretic. No erythema. No pallor.  Psychiatric: She has a normal mood and affect. Her behavior is normal. Judgment and thought content normal.     Lab Results  Component Value Date   WBC 8.1 03/21/2014   HGB 12.5 03/21/2014   HCT 38.1 03/21/2014   PLT 181.0 05/12/2013   GLUCOSE 137* 03/21/2014   CHOL 172 05/12/2013   TRIG 74.0 05/12/2013   HDL 83.70 05/12/2013   LDLCALC 74 05/12/2013   ALT 9 03/21/2014   AST 14 03/21/2014   NA 139 03/21/2014   K 5.4* 03/21/2014   CL 103 03/21/2014   CREATININE 1.56* 03/21/2014   BUN 58* 03/21/2014   CO2 25 03/21/2014   TSH 2.11  05/12/2013   INR 1.31 08/04/2012   HGBA1C 6.0 05/12/2013       Assessment & Plan:

## 2014-04-28 NOTE — Progress Notes (Signed)
Pre visit review using our clinic review tool, if applicable. No additional management support is needed unless otherwise documented below in the visit note. 

## 2014-05-04 ENCOUNTER — Ambulatory Visit: Payer: Medicare Other | Admitting: Podiatry

## 2014-05-06 ENCOUNTER — Ambulatory Visit (INDEPENDENT_AMBULATORY_CARE_PROVIDER_SITE_OTHER): Payer: Medicare Other | Admitting: Podiatry

## 2014-05-06 ENCOUNTER — Encounter: Payer: Self-pay | Admitting: Podiatry

## 2014-05-06 VITALS — BP 142/86 | HR 74 | Resp 12

## 2014-05-06 DIAGNOSIS — I2510 Atherosclerotic heart disease of native coronary artery without angina pectoris: Secondary | ICD-10-CM

## 2014-05-06 DIAGNOSIS — B351 Tinea unguium: Secondary | ICD-10-CM | POA: Diagnosis not present

## 2014-05-06 DIAGNOSIS — M79676 Pain in unspecified toe(s): Secondary | ICD-10-CM

## 2014-05-06 DIAGNOSIS — E1159 Type 2 diabetes mellitus with other circulatory complications: Secondary | ICD-10-CM | POA: Diagnosis not present

## 2014-05-06 DIAGNOSIS — I798 Other disorders of arteries, arterioles and capillaries in diseases classified elsewhere: Secondary | ICD-10-CM

## 2014-05-06 DIAGNOSIS — M79609 Pain in unspecified limb: Secondary | ICD-10-CM | POA: Diagnosis not present

## 2014-05-06 DIAGNOSIS — E1151 Type 2 diabetes mellitus with diabetic peripheral angiopathy without gangrene: Secondary | ICD-10-CM

## 2014-05-06 NOTE — Patient Instructions (Signed)
Diabetes and Foot Care Diabetes may cause you to have problems because of poor blood supply (circulation) to your feet and legs. This may cause the skin on your feet to become thinner, break easier, and heal more slowly. Your skin may become dry, and the skin may peel and crack. You may also have nerve damage in your legs and feet causing decreased feeling in them. You may not notice minor injuries to your feet that could lead to infections or more serious problems. Taking care of your feet is one of the most important things you can do for yourself.  HOME CARE INSTRUCTIONS  Wear shoes at all times, even in the house. Do not go barefoot. Bare feet are easily injured.  Check your feet daily for blisters, cuts, and redness. If you cannot see the bottom of your feet, use a mirror or ask someone for help.  Wash your feet with warm water (do not use hot water) and mild soap. Then pat your feet and the areas between your toes until they are completely dry. Do not soak your feet as this can dry your skin.  Apply a moisturizing lotion or petroleum jelly (that does not contain alcohol and is unscented) to the skin on your feet and to dry, brittle toenails. Do not apply lotion between your toes.  Trim your toenails straight across. Do not dig under them or around the cuticle. File the edges of your nails with an emery board or nail file.  Do not cut corns or calluses or try to remove them with medicine.  Wear clean socks or stockings every day. Make sure they are not too tight. Do not wear knee-high stockings since they may decrease blood flow to your legs.  Wear shoes that fit properly and have enough cushioning. To break in new shoes, wear them for just a few hours a day. This prevents you from injuring your feet. Always look in your shoes before you put them on to be sure there are no objects inside.  Do not cross your legs. This may decrease the blood flow to your feet.  If you find a minor scrape,  cut, or break in the skin on your feet, keep it and the skin around it clean and dry. These areas may be cleansed with mild soap and water. Do not cleanse the area with peroxide, alcohol, or iodine.  When you remove an adhesive bandage, be sure not to damage the skin around it.  If you have a wound, look at it several times a day to make sure it is healing.  Do not use heating pads or hot water bottles. They may burn your skin. If you have lost feeling in your feet or legs, you may not know it is happening until it is too late.  Make sure your health care provider performs a complete foot exam at least annually or more often if you have foot problems. Report any cuts, sores, or bruises to your health care provider immediately. SEEK MEDICAL CARE IF:   You have an injury that is not healing.  You have cuts or breaks in the skin.  You have an ingrown nail.  You notice redness on your legs or feet.  You feel burning or tingling in your legs or feet.  You have pain or cramps in your legs and feet.  Your legs or feet are numb.  Your feet always feel cold. SEEK IMMEDIATE MEDICAL CARE IF:   There is increasing redness,   swelling, or pain in or around a wound.  There is a red line that goes up your leg.  Pus is coming from a wound.  You develop a fever or as directed by your health care provider.  You notice a bad smell coming from an ulcer or wound. Document Released: 07/21/2000 Document Revised: 03/26/2013 Document Reviewed: 12/31/2012 Exit[employer] Patient Information 2015 [employer]. This information is not intended to replace advice given to you by your health care provider. Make sure you discuss any questions you have with your health care provider.  

## 2014-05-07 ENCOUNTER — Encounter: Payer: Self-pay | Admitting: Podiatry

## 2014-05-07 NOTE — Progress Notes (Signed)
Patient ID: Audrey Reed, female   DOB: 1918/07/10, 80 y.o.   MRN: [id number]  Subjective; Patient presents complaining of painful toenails and request replacement diabetic shoes.  Objective: Patient appears to be responsive and answering questions  Vascular: DP 0/4 bilaterally PT is 1/4 bilaterally  Neurological: Vibratory sensation nonreactive bilaterally Ankle reflexes equal reactive bilaterally Sensation to 10 g monofilament wire intact 4/5 right and 5/5 left  Dermatological: Toenails elongated, hypertrophic, discolored 6-10  Musculoskeletal: Hammertoe deformity second bilaterally No restriction ankle, subtalar, midtarsal joints bilaterally  Assessment: Diminished pedal pulses  Peripheral arterial disease Hammertoe deformity second bilaterally Symptomatic onychomycoses bilaterally  Plan: Nails x10 are debrided without a bleeding Obtain certification for diabetic shoes for the indication of Type 2 diabetes Hammertoe deformity Peripheral arterial disease  Reappoint x3 months for nail debridement Notify patient upon receipt of clearance for diabetic shoes

## 2014-05-21 ENCOUNTER — Other Ambulatory Visit: Payer: Self-pay | Admitting: Pulmonary Disease

## 2014-05-22 ENCOUNTER — Telehealth: Payer: Self-pay | Admitting: Internal Medicine

## 2014-05-22 MED ORDER — ALENDRONATE SODIUM 70 MG PO TABS: ORAL_TABLET | ORAL | Status: DC

## 2014-05-22 NOTE — Telephone Encounter (Signed)
Called pt son no answer [facility] pharmacy has been faxing refill to Dr. Lenna Gilford office. Will resend to [facility]...Audrey Reed

## 2014-05-22 NOTE — Telephone Encounter (Signed)
Pt son came by stating the pharmacy has faxed multiple requests to refill Rx: FOSAMAX 70mg  tablets. Please contact pt son when request is reviewed.

## 2014-05-25 ENCOUNTER — Other Ambulatory Visit: Payer: Self-pay | Admitting: *Deleted

## 2014-05-25 MED ORDER — ALENDRONATE SODIUM 70 MG PO TABS: ORAL_TABLET | ORAL | Status: DC

## 2014-08-03 ENCOUNTER — Telehealth: Payer: Self-pay | Admitting: Internal Medicine

## 2014-08-03 DIAGNOSIS — I10 Essential (primary) hypertension: Secondary | ICD-10-CM

## 2014-08-03 MED ORDER — CARVEDILOL 12.5 MG PO TABS: ORAL_TABLET | ORAL | Status: DC

## 2014-08-03 NOTE — Telephone Encounter (Signed)
Pt called in wanting to see if Dr Ronnald Ramp could call in her carvedilol (COREG) 12.5 MG tablet [[id number]]

## 2014-08-04 ENCOUNTER — Telehealth: Payer: Self-pay | Admitting: Internal Medicine

## 2014-08-04 DIAGNOSIS — I10 Essential (primary) hypertension: Secondary | ICD-10-CM

## 2014-08-04 MED ORDER — CARVEDILOL 12.5 MG PO TABS: ORAL_TABLET | ORAL | Status: DC

## 2014-08-04 NOTE — Addendum Note (Signed)
Addended by: Janith Lima on: 08/04/2014 12:15 PM   Modules accepted: Orders

## 2014-08-04 NOTE — Telephone Encounter (Signed)
done

## 2014-08-05 ENCOUNTER — Ambulatory Visit: Payer: Medicare Other | Admitting: Podiatry

## 2014-08-05 NOTE — Telephone Encounter (Signed)
MD already sent on 12/29

## 2014-09-04 ENCOUNTER — Ambulatory Visit (INDEPENDENT_AMBULATORY_CARE_PROVIDER_SITE_OTHER)
Admission: RE | Admit: 2014-09-04 | Discharge: 2014-09-04 | Disposition: A | Discharge status: Home / Self Care | Payer: Medicare Other | Source: Ambulatory Visit | Attending: Internal Medicine | Admitting: Internal Medicine

## 2014-09-04 ENCOUNTER — Ambulatory Visit (INDEPENDENT_AMBULATORY_CARE_PROVIDER_SITE_OTHER): Payer: Medicare Other | Admitting: Internal Medicine

## 2014-09-04 ENCOUNTER — Other Ambulatory Visit (INDEPENDENT_AMBULATORY_CARE_PROVIDER_SITE_OTHER): Payer: Medicare Other

## 2014-09-04 ENCOUNTER — Encounter: Payer: Self-pay | Admitting: Internal Medicine

## 2014-09-04 VITALS — BP 160/80 | HR 69 | Temp 97.9°F | Ht 62.0 in | Wt 128.8 lb

## 2014-09-04 DIAGNOSIS — M25551 Pain in right hip: Secondary | ICD-10-CM

## 2014-09-04 DIAGNOSIS — M545 Low back pain, unspecified: Secondary | ICD-10-CM

## 2014-09-04 DIAGNOSIS — M4186 Other forms of scoliosis, lumbar region: Secondary | ICD-10-CM | POA: Diagnosis not present

## 2014-09-04 DIAGNOSIS — M47816 Spondylosis without myelopathy or radiculopathy, lumbar region: Secondary | ICD-10-CM | POA: Diagnosis not present

## 2014-09-04 DIAGNOSIS — I10 Essential (primary) hypertension: Secondary | ICD-10-CM

## 2014-09-04 LAB — URINALYSIS, ROUTINE W REFLEX MICROSCOPIC
BILIRUBIN URINE: NEGATIVE
Ketones, ur: NEGATIVE
Nitrite: NEGATIVE
PH: 6.5 (ref 5.0–8.0)
Specific Gravity, Urine: 1.01 (ref 1.000–1.030)
Urine Glucose: NEGATIVE
Urobilinogen, UA: 0.2 (ref 0.0–1.0)

## 2014-09-04 MED ORDER — TRAMADOL HCL 50 MG PO TABS: 50.0000 mg | ORAL_TABLET | Freq: Three times a day (TID) | ORAL | Status: DC | PRN

## 2014-09-04 MED ORDER — TIZANIDINE HCL 4 MG PO TABS: 4.0000 mg | ORAL_TABLET | Freq: Four times a day (QID) | ORAL | Status: DC | PRN

## 2014-09-04 MED ORDER — PREDNISONE 10 MG PO TABS: ORAL_TABLET | ORAL | Status: DC

## 2014-09-04 NOTE — Patient Instructions (Signed)
Please take all new medication as prescribed - the pain medication, muscle relaxer, and prednisone  Please go to the LAB in the Basement (turn left off the elevator) for the tests to be done today - just the urine testing today  Please go to the XRAY Department in the Basement (go straight as you get off the elevator) for the x-ray testing  You will be contacted by phone if any changes need to be made immediately.  Otherwise, you will receive a letter about your results with an explanation, but please check with MyChart first.  Please remember to sign up for MyChart if you have not done so, as this will be important to you in the future with finding out test results, communicating by private email, and scheduling acute appointments online when needed.  If you are improved, but feel you are still in need, we can consider referral for Outpatient Physical Therapy in 1-2 wks (if you are able to get out of the home)  Please consider seeing Dr Smith/sport medicine in 1-2 wks if not improved

## 2014-09-04 NOTE — Progress Notes (Signed)
Subjective:    Patient ID: Audrey Reed, female    DOB: 1917-11-10, 81 y.o.   MRN: [id number]  HPI  Here with son, pt c/o 3 days acute onset right lower back painr/buttock and right hip pain, severe, sharp and dull, but no bowel or bladder change, fever, wt loss,  worsening LE pain/numbness/weakness, gait change or falls. Denies urinary symptoms such as dysuria, frequency, urgency, flank pain, hematuria or n/v, fever, chills.  No recent falls or trauma.  + hx of osteoporosis,  No recent heavy lifting or other unusaul activity. Less pain to sit still, but any movement makes worse Past Medical History  Diagnosis Date  . HTN (hypertension)   . CAD (coronary artery disease)   . Venous insufficiency   . Hypercholesterolemia   . DM (diabetes mellitus)   . Diverticulosis of colon   . Colon polyp 06/26/03    9 mm sessile polyp 110 cm from anus; not retrieved  . Adenocarcinoma of breast   . DJD (degenerative joint disease)   . Osteoporosis   . Anxiety disorder   . Dementia   . Dermatophytosis of nail 03/31/2013   Past Surgical History  Procedure Laterality Date  . Cataract extraction    . Mastectomy      for breast cancer    reports that she quit smoking about 27 years ago. She has never used smokeless tobacco. She reports that she does not drink alcohol or use illicit drugs. family history includes Alzheimer's disease in her sister and sister; Heart attack in her sister and sister; Heart disease in her father and mother. Allergies  Allergen Reactions  . Metformin And Related     diarrhea  . Nitrofurantoin     REACTION: unsure of reaction--happened long ago  . Tramadol Nausea Only  . Codeine Nausea And Vomiting, Swelling and Rash   Current Outpatient Prescriptions on File Prior to Visit  Medication Sig Dispense Refill  . acetaminophen (TYLENOL) 325 MG tablet Take 2 tablets (650 mg total) by mouth every 6 (six) hours as needed (or Fever >/= 101).    Marland Kitchen alendronate (FOSAMAX) 70 MG  tablet TAKE 1 TABLET EVERY 7 DAYS. TAKE WITH A FULL GLASS OF WATER ON AN EMPTY STOMACH. 4 tablet 5  . carvedilol (COREG) 12.5 MG tablet TAKE 1 TABLET TWICE A DAY 180 tablet 2  . Cholecalciferol (VITAMIN D3) 1000 UNITS CAPS Take 2 capsules (2,000 Units total) by mouth daily. 90 capsule 3  . cholestyramine light (PREVALITE) 4 GM/DOSE powder Take 4 g by mouth as needed.    . feeding supplement (ENSURE) PUDG Take 1 Container by mouth 3 (three) times daily between meals.    . fluticasone (FLONASE) 50 MCG/ACT nasal spray Place 2 sprays into both nostrils daily. 16 g 6  . furosemide (LASIX) 40 MG tablet take 1 tablet by mouth every morning 90 tablet 3  . lisinopril (PRINIVIL,ZESTRIL) 40 MG tablet take 1 tablet by mouth once daily 90 tablet 3  . loperamide (IMODIUM) 2 MG capsule Take 2 mg by mouth 2 (two) times daily. As needed for diarrhea    . loratadine (CLARITIN) 10 MG tablet Take 1 tablet (10 mg total) by mouth daily. 30 tablet 5  . minoxidil (LONITEN) 10 MG tablet take 1 tablet by mouth once daily 90 tablet 3  . Multiple Vitamins-Minerals (CENTRUM SILVER ADULT 50+ PO) Take 1 tablet by mouth daily.    Marland Kitchen saccharomyces boulardii (FLORASTOR) 250 MG capsule Take 1 capsule (250 mg  total) by mouth daily. 30 capsule 11  . simvastatin (ZOCOR) 20 MG tablet take 1 tablet by mouth at bedtime 90 tablet 3   No current facility-administered medications on file prior to visit.   Review of Systems  Constitutional: Negative for unusual diaphoresis or other sweats  HENT: Negative for ringing in ear Eyes: Negative for double vision or worsening visual disturbance.  Respiratory: Negative for choking and stridor.   Gastrointestinal: Negative for vomiting or other signifcant bowel change Genitourinary: Negative for hematuria or decreased urine volume.  Musculoskeletal: Negative for other MSK pain or swelling Skin: Negative for color change and worsening wound.  Neurological: Negative for tremors and numbness other  than noted  Psychiatric/Behavioral: Negative for decreased concentration or agitation other than above       Objective:   Physical Exam BP 160/80 mmHg  Pulse 69  Temp(Src) 97.9 F (36.6 C) (Oral)  Ht 5\' 2"  (1.575 m)  Wt 128 lb 12 oz (58.401 kg)  BMI 23.54 kg/m2  SpO2 96% VS noted, frail appearing Constitutional: Pt appears well-developed, well-nourished.  HENT: Head: NCAT.  Right Ear: External ear normal.  Left Ear: External ear normal.  Eyes: . Pupils are equal, round, and reactive to light. Conjunctivae and EOM are normal Neck: Normal range of motion. Neck supple.  Cardiovascular: Normal rate and regular rhythm.   Pulmonary/Chest: Effort normal and breath sounds without rales or wheezing.  Abd:  Soft, NT, ND, + BS Spine nontender, mild right buttock tender and lumbar paravertebrla without swelling or rash Mild tender over right greater trochanter Neurological: Pt is alert. Not confused , motor grossly intact Skin: Skin is warm. No rash, no LE edema Psychiatric: Pt behavior is normal. Except mild agitation due to pain     Assessment & Plan:

## 2014-09-05 NOTE — Assessment & Plan Note (Signed)
Mild elev today likely reactive, o/w stable overall by history and exam, recent data reviewed with pt, and pt to continue medical treatment as before,  to f/u any worsening symptoms or concerns BP Readings from Last 3 Encounters:  09/04/14 160/80  05/06/14 142/86  04/28/14 158/82

## 2014-09-05 NOTE — Assessment & Plan Note (Signed)
Mild tender to palpate, ? Bursitis, for films, consider fu with Dr Smith/sport med for further eval

## 2014-09-05 NOTE — Assessment & Plan Note (Addendum)
susepct flare of underlying lumbar djd/ddd or msk strain, but cant r/o fracture with her hx though no recent fall, for films, pain control/muscle relaxer/prednisone trial low dose, also UA- r/o subclinical UTI

## 2014-09-08 ENCOUNTER — Encounter: Payer: Self-pay | Admitting: Internal Medicine

## 2014-09-28 ENCOUNTER — Other Ambulatory Visit: Payer: Medicare Other

## 2014-09-28 ENCOUNTER — Ambulatory Visit (INDEPENDENT_AMBULATORY_CARE_PROVIDER_SITE_OTHER): Payer: Medicare Other | Admitting: Nurse Practitioner

## 2014-09-28 ENCOUNTER — Encounter: Payer: Self-pay | Admitting: Nurse Practitioner

## 2014-09-28 VITALS — BP 130/64 | HR 71 | Temp 98.5°F | Ht 62.0 in | Wt 134.0 lb

## 2014-09-28 DIAGNOSIS — R197 Diarrhea, unspecified: Secondary | ICD-10-CM

## 2014-09-28 LAB — POCT URINALYSIS DIPSTICK
Bilirubin, UA: NEGATIVE
Blood, UA: POSITIVE
Glucose, UA: NEGATIVE
KETONES UA: 1.5
Nitrite, UA: NEGATIVE
PROTEIN UA: 30
SPEC GRAV UA: 1.025
Urobilinogen, UA: NEGATIVE
pH, UA: 6

## 2014-09-28 MED ORDER — METRONIDAZOLE 500 MG PO TABS: 500.0000 mg | ORAL_TABLET | Freq: Two times a day (BID) | ORAL | Status: DC

## 2014-09-28 NOTE — Patient Instructions (Signed)
Diarrhea may be caused by an infectious source.  Start antibiotic. Do not drink alcoholic beverages while taking or for 48 hrs after you finish.  Continue taking florastor-2 hours after you take metronidazole.  Sip fluids every hour to avoid dehydration.  Follow up in 10 days.

## 2014-09-28 NOTE — Progress Notes (Signed)
Subjective:     Audrey Reed is a 81 y.o. female who presents for evaluation of diarrhea. She is with son. She is in wheelchair. She is carrying 4 pronged cane. She lives alone, son lives next door.Onset of diarrhea was 4 days ago. Diarrhea is occurring approximately 3 times per day. Patient describes diarrhea as mucous-laden and semisolid. Diarrhea has been associated with intermittent nausea, anorexia, abd cramping prior to episodes, stool incontinence, clear runny nose. Patient denies blood in stool, fever, illness in household contacts, recent antibiotic use, recent travel, unintentional weight loss, cough, sore throat, headache. Previous visits for diarrhea: none. Evaluation to date: none.  Treatment to date: imodium.  The following portions of the patient's history were reviewed and updated as appropriate: allergies, current medications, past medical history, past social history, past surgical history and problem list.  Review of Systems Pertinent items are noted in HPI.    Objective:    BP 130/64 mmHg  Pulse 71  Temp(Src) 98.5 F (36.9 C) (Oral)  Ht 5\' 2"  (2.751 m)  Wt 134 lb (60.782 kg)  BMI 24.50 kg/m2  SpO2 95% General: alert, cooperative, appears stated age, mild distress and pt needed assistance in bathroom-she had stool in undergarment, on shoes, & pants.  Hydration:  mild hydration, urine SG 1.025  Abdomen: Eyes: Nose: Lungs: Heart: Skin: Neuro: MSK:    diffusely tender, no masses or splenomegaly  Lids & lashes clear, pupils RR  Clear nasal d/c Clear BS bilat panstolic murmur, Rate reg Perineal skin erythematous, no excoriation Grossly intact Needs help to stand, removed brace from L knee due to soiled-not likely very supportive as it was old & flimsy     Assessment:Plan     1. Diarrhea Likely infectious source - POCT urinalysis dipstick-SG 1.025, pos blood - Urine culture - metroNIDAZOLE (FLAGYL) 500 MG tablet; Take 1 tablet (500 mg total) by mouth 2 (two)  times daily.  Dispense: 10 tablet; Refill: 0 Continue florastor Discussed needs help w/hygiene w/son & needs someone to keep daily contact with her to monitor for dehydration. He sd not a problem. F/u 10 dys or sooner if feels worse. Go to Er if vomiting or not drinking, or abd pain worsens.

## 2014-09-28 NOTE — Progress Notes (Signed)
Pre visit review using our clinic review tool, if applicable. No additional management support is needed unless otherwise documented below in the visit note. 

## 2014-09-30 ENCOUNTER — Telehealth: Payer: Self-pay | Admitting: Nurse Practitioner

## 2014-09-30 LAB — URINE CULTURE

## 2014-09-30 NOTE — Telephone Encounter (Signed)
LMOVM for Sonia Side to return call.

## 2014-09-30 NOTE — Telephone Encounter (Signed)
Audrey Reed, pt's son returned call. Audrey Reed said that pt still has diarrhea with no appetite. Patient is feeling about the same as she was when seen in office.

## 2014-09-30 NOTE — Telephone Encounter (Signed)
pls call pt: Ask if better-  Has diarrhea slowed down?

## 2014-09-30 NOTE — Telephone Encounter (Signed)
LMOVM for pt's son Sonia Side to return call.

## 2014-09-30 NOTE — Telephone Encounter (Signed)
Sonia Side returned call and scheduled appt to see Terri Piedra on fri 10/02/14.

## 2014-09-30 NOTE — Telephone Encounter (Signed)
If diarrhea has not slowed down with metronidazole, she should be seen this week. Can you see if a provider at [facility] can see her?

## 2014-10-02 ENCOUNTER — Other Ambulatory Visit (INDEPENDENT_AMBULATORY_CARE_PROVIDER_SITE_OTHER): Payer: Medicare Other

## 2014-10-02 ENCOUNTER — Ambulatory Visit (INDEPENDENT_AMBULATORY_CARE_PROVIDER_SITE_OTHER): Payer: Medicare Other | Admitting: Family

## 2014-10-02 ENCOUNTER — Encounter: Payer: Self-pay | Admitting: Family

## 2014-10-02 VITALS — BP 100/60 | HR 65 | Temp 97.7°F | Wt 135.0 lb

## 2014-10-02 DIAGNOSIS — R197 Diarrhea, unspecified: Secondary | ICD-10-CM

## 2014-10-02 LAB — BASIC METABOLIC PANEL
BUN: 51 mg/dL — ABNORMAL HIGH (ref 6–23)
CALCIUM: 8.4 mg/dL (ref 8.4–10.5)
CHLORIDE: 105 meq/L (ref 96–112)
CO2: 17 meq/L — AB (ref 19–32)
CREATININE: 2.25 mg/dL — AB (ref 0.40–1.20)
GFR: 21.49 mL/min — ABNORMAL LOW (ref >60.00)
GLUCOSE: 152 mg/dL — AB (ref 70–99)
POTASSIUM: 4.5 meq/L (ref 3.5–5.1)
Sodium: 134 mEq/L — ABNORMAL LOW (ref 135–145)

## 2014-10-02 NOTE — Progress Notes (Signed)
Pre visit review using our clinic review tool, if applicable. No additional management support is needed unless otherwise documented below in the visit note. 

## 2014-10-02 NOTE — Assessment & Plan Note (Signed)
Patient continues to experience multiple episodes of diarrhea and loose stools. Obtain stool culture and C. Diff. Obtain BMET to check electrolyte function. Continue to hydrate and eat meals as tolerated. If symptoms worsen go to the ED. Follow up pending culture results.

## 2014-10-02 NOTE — Patient Instructions (Addendum)
Thank you for choosing [facility].  Summary/Instructions:  Please stop by the lab on the basement level of the building for your blood work. Your results will be released to [facility] (or called to you) after review, usually within 72 hours after test completion. If any changes need to be made, you will be notified at that same time.  If your symptoms worsen or fail to improve, please contact our office for further instruction, or in case of emergency go directly to the emergency room at the closest medical facility.   Please bring back a stool culture to the lab.   Please use Desitin or other skin protection creams.

## 2014-10-02 NOTE — Progress Notes (Signed)
   Subjective:    Patient ID: Audrey Reed, female    DOB: [birth year], 81 y.o.   MRN: [id number]  Chief Complaint  Patient presents with  . Diarrhea    x 5-6 days  . Abdominal Pain  . Nausea    HPI:  Audrey Reed is a 81 y.o. female who presents today for follow up of diarrhea. She was previously seen in the office for diarrhea. At that time she was prescribed metronidazole and instructed to follow up if symptoms worsen.  She presents today with the associated symptom of continued diarrhea. States she is having about 10+ bowel movements per day and has been incontinent at times. She is completing the metronidazole and has tried immodium with no relief. Indicates that she has had some abdominal pain that is generalized over her lower abdomen. States that her appetite is decreased and but she is able to eat some soup.   Review of Systems  Constitutional: Negative for fever and chills.  Gastrointestinal: Positive for nausea, abdominal pain and diarrhea. Negative for vomiting, blood in stool, abdominal distention and rectal pain.      Objective:    BP 100/60 mmHg  Pulse 65  Temp(Src) 97.7 F (36.5 C) (Oral)  Wt 135 lb (61.236 kg)  SpO2 96% Nursing note and vital signs reviewed.  Physical Exam  Constitutional: She is oriented to person, place, and time. She appears well-developed and well-nourished. No distress.  Cardiovascular: Normal rate, regular rhythm, normal heart sounds and intact distal pulses.   Pulmonary/Chest: Effort normal and breath sounds normal.  Abdominal: Normal appearance. Bowel sounds are increased. There is no hepatosplenomegaly. There is no tenderness. There is no rigidity, no rebound, no guarding, no tenderness at McBurney's point and negative Murphy's sign.  Negative psoas and obtruator signs.   Neurological: She is alert and oriented to person, place, and time.  Skin: Skin is warm and dry.  Psychiatric: She has a normal mood and affect. Her behavior is  normal. Judgment and thought content normal.       Assessment & Plan:

## 2014-10-03 ENCOUNTER — Telehealth: Payer: Self-pay | Admitting: Family

## 2014-10-03 NOTE — Telephone Encounter (Signed)
Please inform the patient that her lab work shows that her kidney function has decreased, however this is most likely related to dehydration and will improve with hydration. Also her electrolytes look okay at this time.

## 2014-10-05 ENCOUNTER — Other Ambulatory Visit: Payer: Medicare Other

## 2014-10-05 DIAGNOSIS — R197 Diarrhea, unspecified: Secondary | ICD-10-CM

## 2014-10-06 LAB — CLOSTRIDIUM DIFFICILE BY PCR: Toxigenic C. Difficile by PCR: NOT DETECTED

## 2014-10-08 ENCOUNTER — Telehealth: Payer: Self-pay | Admitting: Family

## 2014-10-08 ENCOUNTER — Other Ambulatory Visit (INDEPENDENT_AMBULATORY_CARE_PROVIDER_SITE_OTHER): Payer: Medicare Other

## 2014-10-08 ENCOUNTER — Other Ambulatory Visit: Payer: Self-pay | Admitting: Family

## 2014-10-08 ENCOUNTER — Ambulatory Visit (INDEPENDENT_AMBULATORY_CARE_PROVIDER_SITE_OTHER): Payer: Medicare Other | Admitting: Internal Medicine

## 2014-10-08 ENCOUNTER — Encounter: Payer: Self-pay | Admitting: Internal Medicine

## 2014-10-08 ENCOUNTER — Encounter (HOSPITAL_COMMUNITY): Payer: Self-pay | Admitting: *Deleted

## 2014-10-08 ENCOUNTER — Inpatient Hospital Stay (HOSPITAL_COMMUNITY)
Admission: EM | Admit: 2014-10-08 | Discharge: 2014-10-13 | DRG: 372 | Disposition: A | Discharge status: Skilled Nursing Facility | Payer: Medicare Other | Attending: Family Medicine | Admitting: Family Medicine

## 2014-10-08 ENCOUNTER — Emergency Department (HOSPITAL_COMMUNITY): Payer: Medicare Other

## 2014-10-08 VITALS — BP 140/80 | HR 72 | Temp 98.1°F | Ht 62.0 in

## 2014-10-08 DIAGNOSIS — E785 Hyperlipidemia, unspecified: Secondary | ICD-10-CM | POA: Diagnosis not present

## 2014-10-08 DIAGNOSIS — A047 Enterocolitis due to Clostridium difficile: Principal | ICD-10-CM | POA: Diagnosis present

## 2014-10-08 DIAGNOSIS — Z87891 Personal history of nicotine dependence: Secondary | ICD-10-CM | POA: Diagnosis not present

## 2014-10-08 DIAGNOSIS — Z9849 Cataract extraction status, unspecified eye: Secondary | ICD-10-CM | POA: Diagnosis not present

## 2014-10-08 DIAGNOSIS — M1990 Unspecified osteoarthritis, unspecified site: Secondary | ICD-10-CM | POA: Diagnosis present

## 2014-10-08 DIAGNOSIS — E780 Pure hypercholesterolemia: Secondary | ICD-10-CM | POA: Diagnosis present

## 2014-10-08 DIAGNOSIS — E46 Unspecified protein-calorie malnutrition: Secondary | ICD-10-CM | POA: Diagnosis not present

## 2014-10-08 DIAGNOSIS — R197 Diarrhea, unspecified: Secondary | ICD-10-CM

## 2014-10-08 DIAGNOSIS — N189 Chronic kidney disease, unspecified: Secondary | ICD-10-CM | POA: Diagnosis present

## 2014-10-08 DIAGNOSIS — K8020 Calculus of gallbladder without cholecystitis without obstruction: Secondary | ICD-10-CM | POA: Diagnosis not present

## 2014-10-08 DIAGNOSIS — E1129 Type 2 diabetes mellitus with other diabetic kidney complication: Secondary | ICD-10-CM

## 2014-10-08 DIAGNOSIS — E1122 Type 2 diabetes mellitus with diabetic chronic kidney disease: Secondary | ICD-10-CM | POA: Diagnosis present

## 2014-10-08 DIAGNOSIS — N289 Disorder of kidney and ureter, unspecified: Secondary | ICD-10-CM

## 2014-10-08 DIAGNOSIS — R4702 Dysphasia: Secondary | ICD-10-CM | POA: Diagnosis not present

## 2014-10-08 DIAGNOSIS — Z853 Personal history of malignant neoplasm of breast: Secondary | ICD-10-CM | POA: Diagnosis not present

## 2014-10-08 DIAGNOSIS — E1165 Type 2 diabetes mellitus with hyperglycemia: Principal | ICD-10-CM

## 2014-10-08 DIAGNOSIS — F0390 Unspecified dementia without behavioral disturbance: Secondary | ICD-10-CM | POA: Diagnosis not present

## 2014-10-08 DIAGNOSIS — Z86010 Personal history of colonic polyps: Secondary | ICD-10-CM

## 2014-10-08 DIAGNOSIS — I2510 Atherosclerotic heart disease of native coronary artery without angina pectoris: Secondary | ICD-10-CM | POA: Diagnosis present

## 2014-10-08 DIAGNOSIS — I10 Essential (primary) hypertension: Secondary | ICD-10-CM

## 2014-10-08 DIAGNOSIS — E860 Dehydration: Secondary | ICD-10-CM | POA: Diagnosis present

## 2014-10-08 DIAGNOSIS — I872 Venous insufficiency (chronic) (peripheral): Secondary | ICD-10-CM | POA: Diagnosis present

## 2014-10-08 DIAGNOSIS — F419 Anxiety disorder, unspecified: Secondary | ICD-10-CM | POA: Diagnosis present

## 2014-10-08 DIAGNOSIS — A0472 Enterocolitis due to Clostridium difficile, not specified as recurrent: Secondary | ICD-10-CM

## 2014-10-08 DIAGNOSIS — I129 Hypertensive chronic kidney disease with stage 1 through stage 4 chronic kidney disease, or unspecified chronic kidney disease: Secondary | ICD-10-CM | POA: Diagnosis present

## 2014-10-08 DIAGNOSIS — IMO0002 Reserved for concepts with insufficient information to code with codable children: Secondary | ICD-10-CM

## 2014-10-08 DIAGNOSIS — R1031 Right lower quadrant pain: Secondary | ICD-10-CM | POA: Diagnosis not present

## 2014-10-08 DIAGNOSIS — M810 Age-related osteoporosis without current pathological fracture: Secondary | ICD-10-CM | POA: Diagnosis present

## 2014-10-08 DIAGNOSIS — E119 Type 2 diabetes mellitus without complications: Secondary | ICD-10-CM

## 2014-10-08 DIAGNOSIS — E875 Hyperkalemia: Secondary | ICD-10-CM | POA: Diagnosis present

## 2014-10-08 DIAGNOSIS — R188 Other ascites: Secondary | ICD-10-CM | POA: Diagnosis not present

## 2014-10-08 DIAGNOSIS — I350 Nonrheumatic aortic (valve) stenosis: Secondary | ICD-10-CM | POA: Diagnosis not present

## 2014-10-08 DIAGNOSIS — N179 Acute kidney failure, unspecified: Secondary | ICD-10-CM | POA: Diagnosis present

## 2014-10-08 DIAGNOSIS — L89159 Pressure ulcer of sacral region, unspecified stage: Secondary | ICD-10-CM | POA: Diagnosis not present

## 2014-10-08 LAB — URINALYSIS, ROUTINE W REFLEX MICROSCOPIC
BILIRUBIN URINE: NEGATIVE
KETONES UR: NEGATIVE
LEUKOCYTES UA: NEGATIVE
NITRITE: NEGATIVE
SPECIFIC GRAVITY, URINE: 1.02 (ref 1.000–1.030)
Total Protein, Urine: NEGATIVE
URINE GLUCOSE: NEGATIVE
Urobilinogen, UA: 0.2 (ref 0.0–1.0)
WBC, UA: NONE SEEN (ref 0–?)
pH: 5.5 (ref 5.0–8.0)

## 2014-10-08 LAB — CBC WITH DIFFERENTIAL/PLATELET
BASOS ABS: 0.1 10*3/uL (ref 0.0–0.1)
Basophils Relative: 0.3 % (ref 0.0–3.0)
EOS PCT: 2.7 % (ref 0.0–5.0)
Eosinophils Absolute: 0.5 10*3/uL (ref 0.0–0.7)
HCT: 38.6 % (ref 36.0–46.0)
HEMOGLOBIN: 12.9 g/dL (ref 12.0–15.0)
LYMPHS ABS: 1.6 10*3/uL (ref 0.7–4.0)
LYMPHS PCT: 8.9 % — AB (ref 12.0–46.0)
MCHC: 33.3 g/dL (ref 30.0–36.0)
MCV: 82.7 fl (ref 78.0–100.0)
MONOS PCT: 6.5 % (ref 3.0–12.0)
Monocytes Absolute: 1.2 10*3/uL — ABNORMAL HIGH (ref 0.1–1.0)
Neutro Abs: 14.6 10*3/uL — ABNORMAL HIGH (ref 1.4–7.7)
Neutrophils Relative %: 81.6 % — ABNORMAL HIGH (ref 43.0–77.0)
PLATELETS: 372 10*3/uL (ref 150.0–400.0)
RBC: 4.67 Mil/uL (ref 3.87–5.11)
RDW: 14.9 % (ref 11.5–15.5)
WBC: 17.9 10*3/uL — AB (ref 4.0–10.5)

## 2014-10-08 LAB — BASIC METABOLIC PANEL
BUN: 46 mg/dL — ABNORMAL HIGH (ref 6–23)
CHLORIDE: 105 meq/L (ref 96–112)
CO2: 27 meq/L (ref 19–32)
CREATININE: 1.73 mg/dL — AB (ref 0.40–1.20)
Calcium: 8.9 mg/dL (ref 8.4–10.5)
GFR: 29.1 mL/min — ABNORMAL LOW (ref >60.00)
Glucose, Bld: 162 mg/dL — ABNORMAL HIGH (ref 70–99)
Potassium: 5.6 mEq/L — ABNORMAL HIGH (ref 3.5–5.1)
Sodium: 136 mEq/L (ref 135–145)

## 2014-10-08 LAB — COMPREHENSIVE METABOLIC PANEL
ALK PHOS: 59 U/L (ref 39–117)
ALT: 11 U/L (ref 0–35)
AST: 18 U/L (ref 0–37)
Albumin: 3.2 g/dL — ABNORMAL LOW (ref 3.5–5.2)
BUN: 46 mg/dL — AB (ref 6–23)
CO2: 27 mEq/L (ref 19–32)
Calcium: 8.9 mg/dL (ref 8.4–10.5)
Chloride: 105 mEq/L (ref 96–112)
Creatinine, Ser: 1.73 mg/dL — ABNORMAL HIGH (ref 0.40–1.20)
GFR: 29.1 mL/min — ABNORMAL LOW (ref >60.00)
GLUCOSE: 162 mg/dL — AB (ref 70–99)
Potassium: 5.6 mEq/L — ABNORMAL HIGH (ref 3.5–5.1)
Sodium: 136 mEq/L (ref 135–145)
Total Bilirubin: 0.5 mg/dL (ref 0.2–1.2)
Total Protein: 6.1 g/dL (ref 6.0–8.3)

## 2014-10-08 LAB — TSH: TSH: 3.62 u[IU]/mL (ref 0.35–4.50)

## 2014-10-08 LAB — HEMOGLOBIN A1C: Hgb A1c MFr Bld: 6.6 % — ABNORMAL HIGH (ref 4.6–6.5)

## 2014-10-08 LAB — SEDIMENTATION RATE: Sed Rate: 34 mm/hr — ABNORMAL HIGH (ref 0–22)

## 2014-10-08 MED ORDER — SODIUM CHLORIDE 0.9 % IV SOLN
INTRAVENOUS | Status: DC
  Administered 2014-10-09 – 2014-10-10 (×4): via INTRAVENOUS

## 2014-10-08 MED ORDER — IOHEXOL 300 MG/ML  SOLN
25.0000 mL | INTRAMUSCULAR | Status: AC
  Administered 2014-10-08: 25 mL via ORAL

## 2014-10-08 MED ORDER — CARVEDILOL 12.5 MG PO TABS
12.5000 mg | ORAL_TABLET | Freq: Two times a day (BID) | ORAL | Status: DC
  Administered 2014-10-09 – 2014-10-13 (×9): 12.5 mg via ORAL
  Filled 2014-10-08 (×9): qty 1

## 2014-10-08 MED ORDER — ONDANSETRON HCL 4 MG PO TABS: 4.0000 mg | ORAL_TABLET | Freq: Four times a day (QID) | ORAL | Status: DC | PRN

## 2014-10-08 MED ORDER — SODIUM CHLORIDE 0.9 % IV SOLN
INTRAVENOUS | Status: DC
  Administered 2014-10-09: 15:00:00 via INTRAVENOUS

## 2014-10-08 MED ORDER — INSULIN ASPART 100 UNIT/ML ~~LOC~~ SOLN
0.0000 [IU] | SUBCUTANEOUS | Status: DC
  Administered 2014-10-09 – 2014-10-10 (×4): 1 [IU] via SUBCUTANEOUS
  Administered 2014-10-11: 2 [IU] via SUBCUTANEOUS
  Administered 2014-10-11 (×2): 1 [IU] via SUBCUTANEOUS
  Filled 2014-10-08: qty 0.09

## 2014-10-08 MED ORDER — SODIUM CHLORIDE 0.9 % IV BOLUS (SEPSIS)
1000.0000 mL | Freq: Once | INTRAVENOUS | Status: AC
  Administered 2014-10-08: 1000 mL via INTRAVENOUS

## 2014-10-08 MED ORDER — VANCOMYCIN 50 MG/ML ORAL SOLUTION
125.0000 mg | Freq: Three times a day (TID) | ORAL | Status: DC
  Administered 2014-10-09 – 2014-10-13 (×19): 125 mg via ORAL
  Filled 2014-10-08 (×22): qty 2.5

## 2014-10-08 MED ORDER — SIMVASTATIN 20 MG PO TABS
20.0000 mg | ORAL_TABLET | Freq: Every day | ORAL | Status: DC
  Administered 2014-10-09 – 2014-10-12 (×4): 20 mg via ORAL
  Filled 2014-10-08 (×4): qty 1

## 2014-10-08 MED ORDER — HEPARIN SODIUM (PORCINE) 5000 UNIT/ML IJ SOLN
5000.0000 [IU] | Freq: Three times a day (TID) | INTRAMUSCULAR | Status: DC
  Administered 2014-10-09 – 2014-10-13 (×14): 5000 [IU] via SUBCUTANEOUS
  Filled 2014-10-08 (×13): qty 1

## 2014-10-08 MED ORDER — SODIUM CHLORIDE 0.9 % IJ SOLN
3.0000 mL | Freq: Two times a day (BID) | INTRAMUSCULAR | Status: DC
  Administered 2014-10-10 – 2014-10-13 (×6): 3 mL via INTRAVENOUS

## 2014-10-08 MED ORDER — ONDANSETRON HCL 4 MG/2ML IJ SOLN: 4.0000 mg | Freq: Four times a day (QID) | INTRAMUSCULAR | Status: DC | PRN

## 2014-10-08 NOTE — H&P (Addendum)
Hospitalist Admission History and Physical  Patient name: Audrey Reed Medical record number: [id number] Date of birth: November 25, 1917 Age: 81 y.o. Gender: female  Primary Care Provider: Scarlette Calico, MD  Chief Complaint: diarrhea   History of Present Illness:This is a 81 y.o. year old female with significant past medical history of HTN, CAD, diverticulosis, type 2 DM presenting with diarrhea. Pt reports persistent perfuse diarrhea over the past 8-12 days. Pt states that sxs began after taking prednisone and flexeril for hip flare. Denies any recent abx use. Pt was seen by PCP for sxs. Was placed on oral flagyl. Sxs failed to improve. Worsening renal function over course of illness, though mildly improving.  Was seen by PCP for sxs today and redirected to ER for worsening sxs.  Presents to ER T 98.2, HR 70s-80s, resp 10s, BP 140s-160s, satting 95% on RA. WBC 17.9, 12.9, K 5.6, Cr 1.83. CT negative for acute intra-abdominal process    Assessment and Plan: Audrey Reed is a 81 y.o. year old female presenting with diarrhea, AKI    Active Problems:   Diarrhea   AKI (acute kidney injury)  1- Diarrhea -failed outpt oral flagyl  -start C diff protocol  -oral vancomycin  -hold antidiarrhea agents  -stool studies  -consider GI consult   2-AKI  -likely secondary to diarrheal illness -hydrate -hold offending agents  3- Hyperkalemia -recently on ACE -held by PCP  -no significant EKG changes -kayexalate x1  -follow  4-CAD -no active CP -cont ASA -tele bed  5-HTN -BP stable  -cont home regimen  -follow   FEN/GI: NPO  Prophylaxis: sub q heparin  Disposition: pending further evaluation  Code Status:Full Code    Patient Active Problem List   Diagnosis Date Noted  . Diarrhea 10/02/2014  . Right hip pain 09/04/2014  . Routine general medical examination at a health care facility 12/23/2013  . Renal insufficiency 12/26/2012  . Dementia 08/04/2012  . Severe low back pain  02/15/2011  . ADENOCARCINOMA, BREAST 10/22/2007  . CORONARY ARTERY DISEASE 10/22/2007  . Type II or unspecified type diabetes mellitus with renal manifestations, uncontrolled 05/28/2007  . HYPERCHOLESTEROLEMIA 05/28/2007  . Essential hypertension 05/28/2007  . DEGENERATIVE JOINT DISEASE, GENERALIZED 05/28/2007  . OSTEOPOROSIS 05/28/2007   Past Medical History: Past Medical History  Diagnosis Date  . HTN (hypertension)   . CAD (coronary artery disease)   . Venous insufficiency   . Hypercholesterolemia   . DM (diabetes mellitus)   . Diverticulosis of colon   . Colon polyp 06/26/03    9 mm sessile polyp 110 cm from anus; not retrieved  . Adenocarcinoma of breast   . DJD (degenerative joint disease)   . Osteoporosis   . Anxiety disorder   . Dementia   . Dermatophytosis of nail 03/31/2013    Past Surgical History: Past Surgical History  Procedure Laterality Date  . Cataract extraction    . Mastectomy      for breast cancer    Social History: History   Social History  . Marital Status: Widowed    Spouse Name: N/A  . Number of Children: 1  . Years of Education: N/A   Occupational History  . retired from [employer] after 30+ years    Social History Main Topics  . Smoking status: Former Smoker    Quit date: 08/08/1987  . Smokeless tobacco: Never Used  . Alcohol Use: No  . Drug Use: No  . Sexual Activity: Not Currently   Other Topics Concern  .  None   Social History Narrative   Husband died 20+ years ago   2 sons - one died age 35 w/ kidney cancer   1 son alive, Sonia Side - he is next of kin   Caffeine use: 2-3 cups per day   4 siblings, all sisters - 2 sisters deceased   1 sister is our patient = Koleen Distance    Family History: Family History  Problem Relation Age of Onset  . Heart disease Father   . Heart disease Mother   . Alzheimer's disease Sister   . Alzheimer's disease Sister   . Heart attack Sister   . Heart attack Sister      Allergies: Allergies  Allergen Reactions  . Metformin And Related     diarrhea  . Nitrofurantoin     REACTION: unsure of reaction--happened long ago  . Tramadol Nausea Only  . Codeine Nausea And Vomiting, Swelling and Rash    Current Facility-Administered Medications  Medication Dose Route Frequency Provider Last Rate Last Dose  . 0.9 %  sodium chloride infusion   Intravenous Continuous Blanchie Dessert, MD      . Derrill Memo ON 10/09/2014] carvedilol (COREG) tablet 12.5 mg  12.5 mg Oral BID WC Shanda Howells, MD      . Derrill Memo ON 10/09/2014] simvastatin (ZOCOR) tablet 20 mg  20 mg Oral q1800 Shanda Howells, MD      . vancomycin (VANCOCIN) 50 mg/mL oral solution 125 mg  125 mg Oral QID Shanda Howells, MD       Current Outpatient Prescriptions  Medication Sig Dispense Refill  . acetaminophen (TYLENOL) 325 MG tablet Take 2 tablets (650 mg total) by mouth every 6 (six) hours as needed (or Fever >/= 101).    Marland Kitchen alendronate (FOSAMAX) 70 MG tablet TAKE 1 TABLET EVERY 7 DAYS. TAKE WITH A FULL GLASS OF WATER ON AN EMPTY STOMACH. 4 tablet 5  . carvedilol (COREG) 12.5 MG tablet TAKE 1 TABLET TWICE A DAY 180 tablet 2  . Cholecalciferol (VITAMIN D3) 1000 UNITS CAPS Take 2 capsules (2,000 Units total) by mouth daily. 90 capsule 3  . cholestyramine light (PREVALITE) 4 GM/DOSE powder Take 4 g by mouth as needed.    . feeding supplement (ENSURE) PUDG Take 1 Container by mouth 3 (three) times daily between meals.    . fluticasone (FLONASE) 50 MCG/ACT nasal spray Place 2 sprays into both nostrils daily. 16 g 6  . furosemide (LASIX) 40 MG tablet take 1 tablet by mouth every morning 90 tablet 3  . loperamide (IMODIUM) 2 MG capsule Take 2 mg by mouth 2 (two) times daily. As needed for diarrhea    . loratadine (CLARITIN) 10 MG tablet Take 1 tablet (10 mg total) by mouth daily. 30 tablet 5  . minoxidil (LONITEN) 10 MG tablet take 1 tablet by mouth once daily 90 tablet 3  . Multiple Vitamins-Minerals (CENTRUM SILVER  ADULT 50+ PO) Take 1 tablet by mouth daily.    Marland Kitchen saccharomyces boulardii (FLORASTOR) 250 MG capsule Take 1 capsule (250 mg total) by mouth daily. 30 capsule 11  . simvastatin (ZOCOR) 20 MG tablet take 1 tablet by mouth at bedtime 90 tablet 3  . tiZANidine (ZANAFLEX) 4 MG tablet Take 1 tablet (4 mg total) by mouth every 6 (six) hours as needed for muscle spasms. 60 tablet 0  . traMADol (ULTRAM) 50 MG tablet Take 1 tablet (50 mg total) by mouth every 8 (eight) hours as needed. 60 tablet 1   Review  Of Systems: 12 point ROS negative except as noted above in HPI.  Physical Exam: Filed Vitals:   10/08/14 2245  BP: 143/60  Pulse: 76  Temp:   Resp:     General: cooperative HEENT: PERRLA and extra ocular movement intact Heart: S1, S2 normal, no murmur, rub or gallop, regular rate and rhythm Lungs: clear to auscultation Abdomen:+ bowel sounds, + abd pain  Extremities: extremities normal, atraumatic, no cyanosis or edema Skin:no rashes Neurology: normal without focal findings  Labs and Imaging: Lab Results  Component Value Date/Time   NA 136 10/08/2014 10:41 AM   NA 136 10/08/2014 10:41 AM   K 5.6* 10/08/2014 10:41 AM   K 5.6* 10/08/2014 10:41 AM   CL 105 10/08/2014 10:41 AM   CL 105 10/08/2014 10:41 AM   CO2 27 10/08/2014 10:41 AM   CO2 27 10/08/2014 10:41 AM   BUN 46* 10/08/2014 10:41 AM   BUN 46* 10/08/2014 10:41 AM   CREATININE 1.73* 10/08/2014 10:41 AM   CREATININE 1.73* 10/08/2014 10:41 AM   CREATININE 1.56* 03/21/2014 05:21 PM   GLUCOSE 162* 10/08/2014 10:41 AM   GLUCOSE 162* 10/08/2014 10:41 AM   Lab Results  Component Value Date   WBC 17.9* 10/08/2014   HGB 12.9 10/08/2014   HCT 38.6 10/08/2014   MCV 82.7 10/08/2014   PLT 372.0 10/08/2014   Urinalysis    Component Value Date/Time   COLORURINE YELLOW 10/08/2014 1041   APPEARANCEUR CLEAR 10/08/2014 1041   LABSPEC 1.020 10/08/2014 1041   PHURINE 5.5 10/08/2014 1041   GLUCOSEU NEGATIVE 10/08/2014 1041   GLUCOSEU  NEGATIVE 08/04/2012 1113   HGBUR SMALL* 10/08/2014 1041   BILIRUBINUR NEGATIVE 10/08/2014 1041   BILIRUBINUR Neg 09/28/2014 1444   KETONESUR NEGATIVE 10/08/2014 1041   PROTEINUR 30 09/28/2014 1444   PROTEINUR NEGATIVE 08/04/2012 1113   UROBILINOGEN 0.2 10/08/2014 1041   UROBILINOGEN negative 09/28/2014 1444   NITRITE NEGATIVE 10/08/2014 1041   NITRITE Neg 09/28/2014 1444   LEUKOCYTESUR NEGATIVE 10/08/2014 1041       Ct Abdomen Pelvis Wo Contrast  10/08/2014   CLINICAL DATA:  Right lower quadrant pain for 10-12 days  EXAM: CT ABDOMEN AND PELVIS WITHOUT CONTRAST  TECHNIQUE: Multidetector CT imaging of the abdomen and pelvis was performed following the standard protocol without IV contrast.  COMPARISON:  None.  FINDINGS: Lung bases demonstrate bilateral small pleural effusions and mild bibasilar atelectatic changes.  The liver, spleen, pancreas and adrenal glands are within normal limits. The gallbladder is well distended with some dependent gallstones. There are changes suggestive of mild gallbladder wall thickening although this may be accentuated by small amount of ascites.  The kidneys are well visualized and demonstrate multiple hypodensities bilaterally likely representing cysts. The largest of these is on the left measuring 5.1 cm in greatest dimension. A small 1 cm hyperdensity is noted likely representing a hemorrhagic cyst. This is best seen on image number 22 of series 201. No calculi or obstructive changes are noted.  The appendix is not well visualized although no right lower quadrant inflammatory changes are seen to suggest appendicitis. Small amount of ascites is seen. Diverticulosis of the sigmoid colon is noted with significant spur of muscle hypertrophy. No definitive diverticulitis is seen. The bladder is partially distended. No pelvic mass lesion is seen. No acute bony abnormality is noted.  IMPRESSION: Bilateral small pleural effusions with bibasilar atelectasis.  Mild ascites.   Cholelithiasis with changes suggestive of gallbladder wall thickening although this may be accentuated by  the mild ascites.  Multiple renal cysts without obstructive change.  Diverticulosis without definitive diverticulitis.  The appendix is not well seen although no significant inflammatory changes are noted.   Electronically Signed   By: Inez Catalina M.D.   On: 10/08/2014 22:50           Shanda Howells MD  Pager: [phone]

## 2014-10-08 NOTE — Telephone Encounter (Signed)
Called pt and spoke with son let him know about results. Also per Dr. Ronnald Ramp it was recommended that pt go to the ED to get fluids due to the 3rd office visit for diarrhea, high potassium level and high wbc count. Pt understood and stated that he would go ahead and take her to the ED per providers request.

## 2014-10-08 NOTE — Assessment & Plan Note (Signed)
She has persistent but stable acute on chronic renal insufficiency Her K+ level has gone up as well Will stop the ACEI and I have asked her and her son to return her to the ER to get some IV fluids and to monitor her K+ level and renal function closely

## 2014-10-08 NOTE — ED Notes (Signed)
Patient transported to CT 

## 2014-10-08 NOTE — Telephone Encounter (Signed)
LVM for pt to call back.

## 2014-10-08 NOTE — Progress Notes (Signed)
Pre visit review using our clinic review tool, if applicable. No additional management support is needed unless otherwise documented below in the visit note. 

## 2014-10-08 NOTE — ED Notes (Signed)
Pt in c/o diarrhea for the last week, seen at PCP today and had lab work completed, sent here for further evaluation and treatment of dehydration, denies pain

## 2014-10-08 NOTE — Telephone Encounter (Signed)
Please inform the patient that her C. Diff was negative and currently there is no growth from her stool culture, although this remains preliminary at this time.

## 2014-10-08 NOTE — Progress Notes (Signed)
Subjective:    Patient ID: Audrey Reed, female    DOB: April 11, 1918, 81 y.o.   MRN: [id number]  Diarrhea  This is a recurrent problem. The current episode started 1 to 4 weeks ago. The problem has been unchanged. The stool consistency is described as watery. The patient states that diarrhea awakens her from sleep. Associated symptoms include chills. Pertinent negatives include no abdominal pain, arthralgias, bloating, coughing, fever, headaches, increased  flatus, myalgias, sweats, URI, vomiting or weight loss. She has tried anti-motility drug and increased fluids (flagyl, immidium) for the symptoms. The treatment provided no relief.      Review of Systems  Constitutional: Positive for chills and fatigue. Negative for fever, weight loss, diaphoresis, activity change, appetite change and unexpected weight change.  HENT: Negative.   Eyes: Negative.   Respiratory: Negative.  Negative for cough, choking, chest tightness, shortness of breath and stridor.   Cardiovascular: Negative.  Negative for chest pain, palpitations and leg swelling.  Gastrointestinal: Positive for diarrhea. Negative for nausea, vomiting, abdominal pain, constipation, blood in stool, abdominal distention, anal bleeding, bloating and flatus.  Endocrine: Negative.   Genitourinary: Negative.  Negative for hematuria and difficulty urinating.  Musculoskeletal: Negative.  Negative for myalgias, back pain, arthralgias and neck pain.  Skin: Negative.  Negative for rash.  Allergic/Immunologic: Negative.   Neurological: Negative.  Negative for headaches.  Hematological: Negative.  Negative for adenopathy. Does not bruise/bleed easily.  Psychiatric/Behavioral: Negative.        Objective:   Physical Exam  Constitutional: She is oriented to person, place, and time. She appears well-developed and well-nourished.  Non-toxic appearance. She does not have a sickly appearance. She appears ill (dishevled). No distress.  HENT:  Head:  Normocephalic and atraumatic.  Mouth/Throat: Oropharynx is clear and moist. No oropharyngeal exudate.  Eyes: Conjunctivae are normal. Right eye exhibits no discharge. Left eye exhibits no discharge. No scleral icterus.  Neck: Normal range of motion. Neck supple. No JVD present. No tracheal deviation present. No thyromegaly present.  Cardiovascular: Normal rate, regular rhythm, S1 normal, S2 normal and intact distal pulses.  Exam reveals no gallop, no S3, no S4 and no friction rub.   Murmur heard.  Systolic murmur is present with a grade of 2/6   No diastolic murmur is present  2/6 SEM  Pulmonary/Chest: Effort normal and breath sounds normal. No stridor. No respiratory distress. She has no wheezes. She has no rales. She exhibits no tenderness.  Abdominal: Soft. Normal appearance and bowel sounds are normal. She exhibits no shifting dullness, no distension, no pulsatile liver, no fluid wave, no abdominal bruit, no ascites, no pulsatile midline mass and no mass. There is no hepatosplenomegaly, splenomegaly or hepatomegaly. There is no tenderness. There is no rigidity, no rebound, no guarding, no CVA tenderness, no tenderness at McBurney's point and negative Murphy's sign. No hernia. Hernia confirmed negative in the ventral area, confirmed negative in the right inguinal area and confirmed negative in the left inguinal area.  Musculoskeletal: Normal range of motion. She exhibits edema (1+ pitting edema in BLE). She exhibits no tenderness.  Lymphadenopathy:    She has no cervical adenopathy.  Neurological: She is oriented to person, place, and time.  Skin: Skin is warm and dry. No rash noted. She is not diaphoretic. No erythema. No pallor.  Psychiatric: She has a normal mood and affect. Her behavior is normal. Judgment and thought content normal.  Nursing note and vitals reviewed.   Lab Results  Component Value Date  WBC 17.9* 10/08/2014   HGB 12.9 10/08/2014   HCT 38.6 10/08/2014   PLT 372.0  10/08/2014   GLUCOSE 162* 10/08/2014   GLUCOSE 162* 10/08/2014   CHOL 164 04/28/2014   TRIG 65.0 04/28/2014   HDL 75.20 04/28/2014   LDLCALC 76 04/28/2014   ALT 11 10/08/2014   AST 18 10/08/2014   NA 136 10/08/2014   NA 136 10/08/2014   K 5.6* 10/08/2014   K 5.6* 10/08/2014   CL 105 10/08/2014   CL 105 10/08/2014   CREATININE 1.73* 10/08/2014   CREATININE 1.73* 10/08/2014   BUN 46* 10/08/2014   BUN 46* 10/08/2014   CO2 27 10/08/2014   CO2 27 10/08/2014   TSH 3.62 10/08/2014   INR 1.31 08/04/2012   HGBA1C 6.6* 10/08/2014        Assessment & Plan:

## 2014-10-08 NOTE — Assessment & Plan Note (Signed)
She has a high WBC, I am concerned that she may have C diff infection that did not respond to the flagyl, I have asked her to consider an admission to treat this with IV flagyl or vancomycin

## 2014-10-08 NOTE — Patient Instructions (Signed)

## 2014-10-08 NOTE — ED Provider Notes (Addendum)
CSN: [id number]     Arrival date & time 10/08/14  1745 History   First MD Initiated Contact with Patient 10/08/14 1849     Chief Complaint  Patient presents with  . Diarrhea     (Consider location/radiation/quality/duration/timing/severity/associated sxs/prior Treatment) HPI Comments: Patient states approximate 2 weeks ago she started to have right-sided hip pain and saw her doctor and was placed on Flexeril and prednisone. 4 days into taking the oral prednisone she developed sudden diarrhea that has not resolved. She saw Dr. Ronnald Ramp 6 days ago and at that time labs showed a new acute on chronic renal insufficiency with creatinine of 2.25 from 1.4. She was placed on Flagyl and a C. difficile was ordered. Since that time C. difficile panel come back negative however patient continues to have diarrhea despite taking a course of Flagyl. Saw Dr. Ronnald Ramp today and had repeat labs that showed a creatinine 1.76 and potassium of 5.6 which was elevated from 6 days ago but stable from 3 days ago. She was sent here for further evaluation. She denies any fever or vomiting but does have intermittent nausea and abdominal cramps. She has not eaten much in the last 9 days it is still producing urine. She denies any antibiotics in the last few months.  Patient is a 81 y.o. female presenting with diarrhea. The history is provided by the patient.  Diarrhea Quality:  Watery Severity:  Severe Onset quality:  Sudden Number of episodes:  >20 episodes a day  Duration:  9 days Timing:  Constant Progression:  Unchanged Relieved by:  Nothing Exacerbated by: eating. Ineffective treatments: flagyl. Associated symptoms: abdominal pain   Associated symptoms: no chills, no recent cough, no fever and no vomiting   Associated symptoms comment:  Intermittent nausea.  States she gets abdominal cramps Risk factors: no recent antibiotic use     Past Medical History  Diagnosis Date  . HTN (hypertension)   . CAD (coronary  artery disease)   . Venous insufficiency   . Hypercholesterolemia   . DM (diabetes mellitus)   . Diverticulosis of colon   . Colon polyp 06/26/03    9 mm sessile polyp 110 cm from anus; not retrieved  . Adenocarcinoma of breast   . DJD (degenerative joint disease)   . Osteoporosis   . Anxiety disorder   . Dementia   . Dermatophytosis of nail 03/31/2013   Past Surgical History  Procedure Laterality Date  . Cataract extraction    . Mastectomy      for breast cancer   Family History  Problem Relation Age of Onset  . Heart disease Father   . Heart disease Mother   . Alzheimer's disease Sister   . Alzheimer's disease Sister   . Heart attack Sister   . Heart attack Sister    History  Substance Use Topics  . Smoking status: Former Smoker    Quit date: 08/08/1987  . Smokeless tobacco: Never Used  . Alcohol Use: No   OB History    No data available     Review of Systems  Constitutional: Negative for fever and chills.  Gastrointestinal: Positive for abdominal pain and diarrhea. Negative for vomiting.  All other systems reviewed and are negative.     Allergies  Metformin and related; Nitrofurantoin; Tramadol; and Codeine  Home Medications   Prior to Admission medications   Medication Sig Start Date End Date Taking? Authorizing Provider  acetaminophen (TYLENOL) 325 MG tablet Take 2 tablets (650 mg total) by  mouth every 6 (six) hours as needed (or Fever >/= 101). 08/15/12   Radene Gunning, NP  alendronate (FOSAMAX) 70 MG tablet TAKE 1 TABLET EVERY 7 DAYS. TAKE WITH A FULL GLASS OF WATER ON AN EMPTY STOMACH. 05/25/14   Lew Dawes V, MD  carvedilol (COREG) 12.5 MG tablet TAKE 1 TABLET TWICE A DAY 08/04/14   Janith Lima, MD  Cholecalciferol (VITAMIN D3) 1000 UNITS CAPS Take 2 capsules (2,000 Units total) by mouth daily. 04/28/14   Janith Lima, MD  cholestyramine light (PREVALITE) 4 GM/DOSE powder Take 4 g by mouth as needed.    Historical Provider, MD  feeding  supplement (ENSURE) PUDG Take 1 Container by mouth 3 (three) times daily between meals. 08/15/12   Radene Gunning, NP  fluticasone (FLONASE) 50 MCG/ACT nasal spray Place 2 sprays into both nostrils daily. 04/28/14   Janith Lima, MD  furosemide (LASIX) 40 MG tablet take 1 tablet by mouth every morning 04/28/14   Janith Lima, MD  loperamide (IMODIUM) 2 MG capsule Take 2 mg by mouth 2 (two) times daily. As needed for diarrhea    Historical Provider, MD  loratadine (CLARITIN) 10 MG tablet Take 1 tablet (10 mg total) by mouth daily. 03/21/14   Wardell Honour, MD  minoxidil (LONITEN) 10 MG tablet take 1 tablet by mouth once daily 04/28/14   Janith Lima, MD  Multiple Vitamins-Minerals (CENTRUM SILVER ADULT 50+ PO) Take 1 tablet by mouth daily.    Historical Provider, MD  saccharomyces boulardii (FLORASTOR) 250 MG capsule Take 1 capsule (250 mg total) by mouth daily. 10/28/12   Noralee Space, MD  simvastatin (ZOCOR) 20 MG tablet take 1 tablet by mouth at bedtime 04/28/14   Janith Lima, MD  tiZANidine (ZANAFLEX) 4 MG tablet Take 1 tablet (4 mg total) by mouth every 6 (six) hours as needed for muscle spasms. 09/04/14   Biagio Borg, MD  traMADol (ULTRAM) 50 MG tablet Take 1 tablet (50 mg total) by mouth every 8 (eight) hours as needed. 09/04/14   Biagio Borg, MD   BP 156/74 mmHg  Pulse 76  Temp(Src) 98.2 F (36.8 C) (Oral)  Resp 18  Ht 5\' 2"  (1.575 m)  Wt 135 lb (61.236 kg)  BMI 24.69 kg/m2  SpO2 98% Physical Exam  Constitutional: She is oriented to person, place, and time. She appears well-developed and well-nourished. No distress.  HENT:  Head: Normocephalic and atraumatic.  Mouth/Throat: Oropharynx is clear and moist. Mucous membranes are dry.  Eyes: Conjunctivae and EOM are normal. Pupils are equal, round, and reactive to light.  Neck: Normal range of motion. Neck supple.  Cardiovascular: Normal rate, regular rhythm and intact distal pulses.   No murmur heard. Pulmonary/Chest: Effort normal  and breath sounds normal. No respiratory distress. She has no wheezes. She has no rales.  Abdominal: Soft. She exhibits no distension. There is tenderness in the right lower quadrant. There is guarding. There is no rebound.  Musculoskeletal: Normal range of motion. She exhibits no edema or tenderness.  Neurological: She is alert and oriented to person, place, and time.  Skin: Skin is warm and dry. No rash noted. No erythema.  Psychiatric: She has a normal mood and affect. Her behavior is normal.  Nursing note and vitals reviewed.   ED Course  Procedures (including critical care time) Labs Review Labs Reviewed - No data to display  Imaging Review Ct Abdomen Pelvis Wo Contrast  10/08/2014  CLINICAL DATA:  Right lower quadrant pain for 10-12 days  EXAM: CT ABDOMEN AND PELVIS WITHOUT CONTRAST  TECHNIQUE: Multidetector CT imaging of the abdomen and pelvis was performed following the standard protocol without IV contrast.  COMPARISON:  None.  FINDINGS: Lung bases demonstrate bilateral small pleural effusions and mild bibasilar atelectatic changes.  The liver, spleen, pancreas and adrenal glands are within normal limits. The gallbladder is well distended with some dependent gallstones. There are changes suggestive of mild gallbladder wall thickening although this may be accentuated by small amount of ascites.  The kidneys are well visualized and demonstrate multiple hypodensities bilaterally likely representing cysts. The largest of these is on the left measuring 5.1 cm in greatest dimension. A small 1 cm hyperdensity is noted likely representing a hemorrhagic cyst. This is best seen on image number 22 of series 201. No calculi or obstructive changes are noted.  The appendix is not well visualized although no right lower quadrant inflammatory changes are seen to suggest appendicitis. Small amount of ascites is seen. Diverticulosis of the sigmoid colon is noted with significant spur of muscle hypertrophy. No  definitive diverticulitis is seen. The bladder is partially distended. No pelvic mass lesion is seen. No acute bony abnormality is noted.  IMPRESSION: Bilateral small pleural effusions with bibasilar atelectasis.  Mild ascites.  Cholelithiasis with changes suggestive of gallbladder wall thickening although this may be accentuated by the mild ascites.  Multiple renal cysts without obstructive change.  Diverticulosis without definitive diverticulitis.  The appendix is not well seen although no significant inflammatory changes are noted.   Electronically Signed   By: Inez Catalina M.D.   On: 10/08/2014 22:50     EKG Interpretation None      MDM   Final diagnoses:  RLQ abdominal pain  Diarrhea  Dehydration  Acute on chronic renal failure    Patient states she developed severe diarrhea after being treated with 5 days of prednisone and Flexeril for right hip pain. She has been seeing Dr. Ronnald Ramp her PCP and initially found to have acute on chronic renal insufficiency with a creatinine going from 1.4-2.25. That was 6 days ago. Patient has been seen 2 other times at PCPs office with persistent diarrhea approximate 20 episodes a day or more. She completed a full course of Flagyl without improvement of diarrhea. She had a negative C. difficile done 5 days ago and denies any recent antibiotic use. She had a normal UA. CBC done today in the office showed a white blood cell count of 17,000. On exam patient has right lower quadrant pain status post appendectomy. Concern for possible pathology in the right lower quadrant as the cause of her diarrhea and pain. CT with oral contrast only ordered and patient given IV fluids and she has not been eating for the last 9 days.  10:53 PM CT neg for acute pathology as cause of the pain or diarrhea.  C.diff reordered but will admit for hydration.  Blanchie Dessert, MD 10/08/14 2256  Blanchie Dessert, MD 10/08/14 [phone]

## 2014-10-09 ENCOUNTER — Encounter (HOSPITAL_COMMUNITY): Payer: Self-pay | Admitting: *Deleted

## 2014-10-09 LAB — COMPREHENSIVE METABOLIC PANEL
ALBUMIN: 2.6 g/dL — AB (ref 3.5–5.2)
ALK PHOS: 50 U/L (ref 39–117)
ALT: 14 U/L (ref 0–35)
ALT: 13 U/L (ref 0–35)
AST: 29 U/L (ref 0–37)
AST: 26 U/L (ref 0–37)
Albumin: 2.6 g/dL — ABNORMAL LOW (ref 3.5–5.2)
Alkaline Phosphatase: 52 U/L (ref 39–117)
Anion gap: 7 (ref 5–15)
Anion gap: 8 (ref 5–15)
BILIRUBIN TOTAL: 0.8 mg/dL (ref 0.3–1.2)
BILIRUBIN TOTAL: 0.6 mg/dL (ref 0.3–1.2)
BUN: 34 mg/dL — ABNORMAL HIGH (ref 6–23)
BUN: 29 mg/dL — ABNORMAL HIGH (ref 6–23)
CHLORIDE: 106 mmol/L (ref 96–112)
CO2: 22 mmol/L (ref 19–32)
CO2: 23 mmol/L (ref 19–32)
CREATININE: 1.36 mg/dL — AB (ref 0.50–1.10)
Calcium: 8.3 mg/dL — ABNORMAL LOW (ref 8.4–10.5)
Calcium: 8.4 mg/dL (ref 8.4–10.5)
Chloride: 108 mmol/L (ref 96–112)
Creatinine, Ser: 1.46 mg/dL — ABNORMAL HIGH (ref 0.50–1.10)
GFR calc Af Amer: 34 mL/min — ABNORMAL LOW (ref >90)
GFR calc Af Amer: 37 mL/min — ABNORMAL LOW (ref >90)
GFR calc non Af Amer: 32 mL/min — ABNORMAL LOW (ref >90)
GFR, EST NON AFRICAN AMERICAN: 30 mL/min — AB (ref >90)
Glucose, Bld: 119 mg/dL — ABNORMAL HIGH (ref 70–99)
Glucose, Bld: 129 mg/dL — ABNORMAL HIGH (ref 70–99)
Potassium: 5.3 mmol/L — ABNORMAL HIGH (ref 3.5–5.1)
Potassium: 4.7 mmol/L (ref 3.5–5.1)
Sodium: 135 mmol/L (ref 135–145)
Sodium: 139 mmol/L (ref 135–145)
TOTAL PROTEIN: 5 g/dL — AB (ref 6.0–8.3)
Total Protein: 5.1 g/dL — ABNORMAL LOW (ref 6.0–8.3)

## 2014-10-09 LAB — CBC WITH DIFFERENTIAL/PLATELET
Basophils Absolute: 0.1 10*3/uL (ref 0.0–0.1)
Basophils Relative: 1 % (ref 0–1)
EOS ABS: 0.4 10*3/uL (ref 0.0–0.7)
Eosinophils Relative: 2 % (ref 0–5)
HCT: 37.7 % (ref 36.0–46.0)
HEMOGLOBIN: 12.4 g/dL (ref 12.0–15.0)
LYMPHS ABS: 1.5 10*3/uL (ref 0.7–4.0)
Lymphocytes Relative: 10 % — ABNORMAL LOW (ref 12–46)
MCH: 27.2 pg (ref 26.0–34.0)
MCHC: 32.9 g/dL (ref 30.0–36.0)
MCV: 82.7 fL (ref 78.0–100.0)
MONOS PCT: 11 % (ref 3–12)
Monocytes Absolute: 1.7 10*3/uL — ABNORMAL HIGH (ref 0.1–1.0)
NEUTROS PCT: 76 % (ref 43–77)
Neutro Abs: 12.1 10*3/uL — ABNORMAL HIGH (ref 1.7–7.7)
Platelets: 291 10*3/uL (ref 150–400)
RBC: 4.56 MIL/uL (ref 3.87–5.11)
RDW: 15.4 % (ref 11.5–15.5)
WBC: 15.8 10*3/uL — ABNORMAL HIGH (ref 4.0–10.5)

## 2014-10-09 LAB — GLUCOSE, CAPILLARY
GLUCOSE-CAPILLARY: 105 mg/dL — AB (ref 70–99)
Glucose-Capillary: 93 mg/dL (ref 70–99)
Glucose-Capillary: 88 mg/dL (ref 70–99)
Glucose-Capillary: 125 mg/dL — ABNORMAL HIGH (ref 70–99)
Glucose-Capillary: 123 mg/dL — ABNORMAL HIGH (ref 70–99)
Glucose-Capillary: 86 mg/dL (ref 70–99)
Glucose-Capillary: 95 mg/dL (ref 70–99)

## 2014-10-09 LAB — STOOL CULTURE

## 2014-10-09 LAB — CLOSTRIDIUM DIFFICILE BY PCR: Toxigenic C. Difficile by PCR: POSITIVE — AB

## 2014-10-09 MED ORDER — SODIUM POLYSTYRENE SULFONATE 15 GM/60ML PO SUSP
30.0000 g | Freq: Once | ORAL | Status: AC
  Administered 2014-10-09: 30 g via ORAL
  Filled 2014-10-09: qty 120

## 2014-10-09 MED ORDER — CETYLPYRIDINIUM CHLORIDE 0.05 % MT LIQD
7.0000 mL | Freq: Two times a day (BID) | OROMUCOSAL | Status: DC
  Administered 2014-10-10 – 2014-10-11 (×3): 7 mL via OROMUCOSAL

## 2014-10-09 MED ORDER — CHLORHEXIDINE GLUCONATE 0.12 % MT SOLN
15.0000 mL | Freq: Two times a day (BID) | OROMUCOSAL | Status: DC
  Administered 2014-10-09 – 2014-10-12 (×7): 15 mL via OROMUCOSAL
  Filled 2014-10-09 (×8): qty 15

## 2014-10-09 NOTE — Progress Notes (Signed)
Patient seen and evaluated earlier the same by my associate. Please refer to H&P for details regarding assessment and plan.  We'll obtain GI pathogen panel  Patient evaluated and has no new complaints.  We'll reassess next a.m.  Kadince Boxley, [employer]

## 2014-10-10 DIAGNOSIS — E119 Type 2 diabetes mellitus without complications: Secondary | ICD-10-CM

## 2014-10-10 DIAGNOSIS — I10 Essential (primary) hypertension: Secondary | ICD-10-CM

## 2014-10-10 DIAGNOSIS — A0472 Enterocolitis due to Clostridium difficile, not specified as recurrent: Secondary | ICD-10-CM

## 2014-10-10 DIAGNOSIS — A047 Enterocolitis due to Clostridium difficile: Principal | ICD-10-CM

## 2014-10-10 LAB — COMPREHENSIVE METABOLIC PANEL
ALBUMIN: 2.3 g/dL — AB (ref 3.5–5.2)
ALBUMIN: 2.5 g/dL — AB (ref 3.5–5.2)
ALT: 13 U/L (ref 0–35)
ALT: 13 U/L (ref 0–35)
ANION GAP: 6 (ref 5–15)
AST: 20 U/L (ref 0–37)
AST: 23 U/L (ref 0–37)
Alkaline Phosphatase: 44 U/L (ref 39–117)
Alkaline Phosphatase: 47 U/L (ref 39–117)
Anion gap: 8 (ref 5–15)
BILIRUBIN TOTAL: 0.7 mg/dL (ref 0.3–1.2)
BUN: 22 mg/dL (ref 6–23)
BUN: 24 mg/dL — ABNORMAL HIGH (ref 6–23)
CHLORIDE: 115 mmol/L — AB (ref 96–112)
CO2: 19 mmol/L (ref 19–32)
CO2: 20 mmol/L (ref 19–32)
CREATININE: 1.23 mg/dL — AB (ref 0.50–1.10)
CREATININE: 1.33 mg/dL — AB (ref 0.50–1.10)
Calcium: 8.1 mg/dL — ABNORMAL LOW (ref 8.4–10.5)
Calcium: 8.3 mg/dL — ABNORMAL LOW (ref 8.4–10.5)
Chloride: 114 mmol/L — ABNORMAL HIGH (ref 96–112)
GFR calc non Af Amer: 36 mL/min — ABNORMAL LOW (ref >90)
GFR, EST AFRICAN AMERICAN: 38 mL/min — AB (ref >90)
GFR, EST AFRICAN AMERICAN: 42 mL/min — AB (ref >90)
GFR, EST NON AFRICAN AMERICAN: 33 mL/min — AB (ref >90)
GLUCOSE: 102 mg/dL — AB (ref 70–99)
Glucose, Bld: 117 mg/dL — ABNORMAL HIGH (ref 70–99)
Potassium: 4.8 mmol/L (ref 3.5–5.1)
Potassium: 4.7 mmol/L (ref 3.5–5.1)
SODIUM: 140 mmol/L (ref 135–145)
Sodium: 142 mmol/L (ref 135–145)
TOTAL PROTEIN: 5.2 g/dL — AB (ref 6.0–8.3)
Total Bilirubin: 0.8 mg/dL (ref 0.3–1.2)
Total Protein: 4.6 g/dL — ABNORMAL LOW (ref 6.0–8.3)

## 2014-10-10 LAB — CBC WITH DIFFERENTIAL/PLATELET
BASOS ABS: 0.1 10*3/uL (ref 0.0–0.1)
BASOS PCT: 0 % (ref 0–1)
EOS ABS: 0.3 10*3/uL (ref 0.0–0.7)
Eosinophils Relative: 2 % (ref 0–5)
HEMATOCRIT: 35.3 % — AB (ref 36.0–46.0)
Hemoglobin: 11.7 g/dL — ABNORMAL LOW (ref 12.0–15.0)
LYMPHS ABS: 1.6 10*3/uL (ref 0.7–4.0)
LYMPHS PCT: 13 % (ref 12–46)
MCH: 27.7 pg (ref 26.0–34.0)
MCHC: 33.1 g/dL (ref 30.0–36.0)
MCV: 83.5 fL (ref 78.0–100.0)
MONOS PCT: 10 % (ref 3–12)
Monocytes Absolute: 1.3 10*3/uL — ABNORMAL HIGH (ref 0.1–1.0)
NEUTROS PCT: 75 % (ref 43–77)
Neutro Abs: 9 10*3/uL — ABNORMAL HIGH (ref 1.7–7.7)
Platelets: 278 10*3/uL (ref 150–400)
RBC: 4.23 MIL/uL (ref 3.87–5.11)
RDW: 15.8 % — AB (ref 11.5–15.5)
WBC: 12.1 10*3/uL — ABNORMAL HIGH (ref 4.0–10.5)

## 2014-10-10 LAB — FECAL LACTOFERRIN, QUANT: Fecal Lactoferrin: POSITIVE

## 2014-10-10 LAB — GLUCOSE, CAPILLARY
Glucose-Capillary: 96 mg/dL (ref 70–99)
Glucose-Capillary: 90 mg/dL (ref 70–99)
Glucose-Capillary: 120 mg/dL — ABNORMAL HIGH (ref 70–99)
Glucose-Capillary: 149 mg/dL — ABNORMAL HIGH (ref 70–99)
Glucose-Capillary: 135 mg/dL — ABNORMAL HIGH (ref 70–99)

## 2014-10-10 NOTE — Progress Notes (Signed)
[facility] PROGRESS NOTE  Audrey Reed HYI:[id number] DOB: Dec 22, 1917 DOA: 10/08/2014 PCP: Scarlette Calico, MD  Assessment/Plan:   Enteritis due to Clostridium difficile - C. difficile PCR positive. Continue contact precautions. Advance diet given improvement in condition. Continue oral vancomycin therapy.  Active Problems:   Diabetes -We'll place on diabetic diet. Continue sliding scale insulin. CBG checks every before meals and daily at bedtime.    Essential hypertension -Blood pressures have been soft but are trending up continue beta blocker currently.    Dementia -Stable    Diarrhea - Improving patient reports only one bout today    AKI (acute kidney injury) - Most likely prerenal etiology and has been improving with IV fluid rehydration and improvement in oral intake will reassess serum creatinine next a.m.  Code Status: Full Family Communication: No family at bedside  Disposition Plan: Pending improvement in condition likely discharge within the next one or 2 days. Of note patient lives alone will order PT eval for disposition planning.   Consultants:  None  Procedures:  None  Antibiotics:  Oral vancomycin  HPI/Subjective: Patient has no new complaints. States she feels better  Objective: Filed Vitals:   10/10/14 0512  BP: 140/57  Pulse: 85  Temp: 98.2 F (36.8 C)  Resp: 16    Intake/Output Summary (Last 24 hours) at 10/10/14 1320 Last data filed at 10/10/14 1219  Gross per 24 hour  Intake 3676.83 ml  Output      0 ml  Net 3676.83 ml   Filed Weights   10/08/14 1815 10/09/14 0105  Weight: 61.236 kg (135 lb) 60.782 kg (134 lb)    Exam:   General:  Pt in nad, alert and awake  Cardiovascular: rrr, no mrg  Respiratory: no wheezes, no increased wob,   Abdomen: NT, ND, no guarding  Musculoskeletal: no cyanosis or clubbing   Data Reviewed: Basic Metabolic Panel:  Recent Labs Lab 10/08/14 1041 10/09/14 0028 10/09/14 0450  10/09/14 1123  NA 136  136 140 135 139  K 5.6*  5.6* 4.8 5.3* 4.7  CL 105  105 114* 106 108  CO2 27  27 20 22 23   GLUCOSE 162*  162* 102* 119* 129*  BUN 46*  46* 24* 34* 29*  CREATININE 1.73*  1.73* 1.33* 1.46* 1.36*  CALCIUM 8.9  8.9 8.1* 8.3* 8.4   Liver Function Tests:  Recent Labs Lab 10/08/14 1041 10/09/14 0028 10/09/14 0450 10/09/14 1123  AST 18 20 29 26   ALT 11 13 14 13   ALKPHOS 59 44 50 52  BILITOT 0.5 0.7 0.8 0.6  PROT 6.1 4.6* 5.1* 5.0*  ALBUMIN 3.2* 2.3* 2.6* 2.6*   No results for input(s): LIPASE, AMYLASE in the last 168 hours. No results for input(s): AMMONIA in the last 168 hours. CBC:  Recent Labs Lab 10/08/14 1041 10/09/14 0450 10/10/14 0028  WBC 17.9* 15.8* 12.1*  NEUTROABS 14.6* 12.1* 9.0*  HGB 12.9 12.4 11.7*  HCT 38.6 37.7 35.3*  MCV 82.7 82.7 83.5  PLT 372.0 291 278   Cardiac Enzymes: No results for input(s): CKTOTAL, CKMB, CKMBINDEX, TROPONINI in the last 168 hours. BNP (last 3 results) No results for input(s): BNP in the last 8760 hours.  ProBNP (last 3 results) No results for input(s): PROBNP in the last 8760 hours.  CBG:  Recent Labs Lab 10/09/14 1939 10/09/14 2327 10/10/14 0335 10/10/14 0725 10/10/14 1133  GLUCAP 86 95 96 90 120*    Recent Results (from the past 240 hour(s))  Stool  Culture     Status: None   Collection Time: 10/05/14 10:12 AM  Result Value Ref Range Status   Organism ID, Bacteria No Salmonella,Shigella,Campylobacter,Yersinia,or  Final   Organism ID, Bacteria No E.coli [id number]:H7 isolated.  Final  Clostridium Difficile by PCR     Status: None   Collection Time: 10/05/14 10:12 AM  Result Value Ref Range Status   C difficile by pcr Not Detected Not Detected Final    Comment: This test is for use only with liquid or soft stools; performance characteristics of other clinical specimen types have not been established.   This assay was performed by Cepheid GeneXpert(R) PCR. The performance  characteristics of this assay have been determined by [employer]. Performance characteristics refer to the analytical performance of the test.   Clostridium Difficile by PCR     Status: Abnormal   Collection Time: 10/08/14 11:31 PM  Result Value Ref Range Status   C difficile by pcr POSITIVE (A) NEGATIVE Final    Comment: CRITICAL RESULT CALLED TO, READ BACK BY AND VERIFIED WITH: T. COOK RN 9:55 10/09/14 ([email or web address])      Studies: Ct Abdomen Pelvis Wo Contrast  10/08/2014   CLINICAL DATA:  Right lower quadrant pain for 10-12 days  EXAM: CT ABDOMEN AND PELVIS WITHOUT CONTRAST  TECHNIQUE: Multidetector CT imaging of the abdomen and pelvis was performed following the standard protocol without IV contrast.  COMPARISON:  None.  FINDINGS: Lung bases demonstrate bilateral small pleural effusions and mild bibasilar atelectatic changes.  The liver, spleen, pancreas and adrenal glands are within normal limits. The gallbladder is well distended with some dependent gallstones. There are changes suggestive of mild gallbladder wall thickening although this may be accentuated by small amount of ascites.  The kidneys are well visualized and demonstrate multiple hypodensities bilaterally likely representing cysts. The largest of these is on the left measuring 5.1 cm in greatest dimension. A small 1 cm hyperdensity is noted likely representing a hemorrhagic cyst. This is best seen on image number 22 of series 201. No calculi or obstructive changes are noted.  The appendix is not well visualized although no right lower quadrant inflammatory changes are seen to suggest appendicitis. Small amount of ascites is seen. Diverticulosis of the sigmoid colon is noted with significant spur of muscle hypertrophy. No definitive diverticulitis is seen. The bladder is partially distended. No pelvic mass lesion is seen. No acute bony abnormality is noted.  IMPRESSION: Bilateral small pleural effusions with bibasilar atelectasis.   Mild ascites.  Cholelithiasis with changes suggestive of gallbladder wall thickening although this may be accentuated by the mild ascites.  Multiple renal cysts without obstructive change.  Diverticulosis without definitive diverticulitis.  The appendix is not well seen although no significant inflammatory changes are noted.   Electronically Signed   By: Inez Catalina M.D.   On: 10/08/2014 22:50    Scheduled Meds: . antiseptic oral rinse  7 mL Mouth Rinse q12n4p  . carvedilol  12.5 mg Oral BID WC  . chlorhexidine  15 mL Mouth Rinse BID  . heparin  5,000 Units Subcutaneous 3 times per day  . insulin aspart  0-9 Units Subcutaneous 6 times per day  . simvastatin  20 mg Oral q1800  . sodium chloride  3 mL Intravenous Q12H  . vancomycin  125 mg Oral TID AC & HS   Continuous Infusions: . sodium chloride Stopped (10/10/14 1219)     Time spent: > 35 minutes    Layanna Charo, [employer]  [facility] Pager [phone] If 7PM-7AM, please contact night-coverage at [email or web address], password [facility] 10/10/2014, 1:20 PM  LOS: 2 days

## 2014-10-11 DIAGNOSIS — F0390 Unspecified dementia without behavioral disturbance: Secondary | ICD-10-CM

## 2014-10-11 DIAGNOSIS — N179 Acute kidney failure, unspecified: Secondary | ICD-10-CM

## 2014-10-11 LAB — COMPREHENSIVE METABOLIC PANEL
ALBUMIN: 2.3 g/dL — AB (ref 3.5–5.2)
ALBUMIN: 2.3 g/dL — AB (ref 3.5–5.2)
ALT: 12 U/L (ref 0–35)
ALT: 13 U/L (ref 0–35)
ANION GAP: 4 — AB (ref 5–15)
AST: 22 U/L (ref 0–37)
AST: 24 U/L (ref 0–37)
Alkaline Phosphatase: 76 U/L (ref 39–117)
Alkaline Phosphatase: 46 U/L (ref 39–117)
Anion gap: 6 (ref 5–15)
BUN: 24 mg/dL — ABNORMAL HIGH (ref 6–23)
BUN: 22 mg/dL (ref 6–23)
CO2: 21 mmol/L (ref 19–32)
CO2: 25 mmol/L (ref 19–32)
Calcium: 8.3 mg/dL — ABNORMAL LOW (ref 8.4–10.5)
Calcium: 8.2 mg/dL — ABNORMAL LOW (ref 8.4–10.5)
Chloride: 112 mmol/L (ref 96–112)
Chloride: 110 mmol/L (ref 96–112)
Creatinine, Ser: 1.25 mg/dL — ABNORMAL HIGH (ref 0.50–1.10)
Creatinine, Ser: 1.33 mg/dL — ABNORMAL HIGH (ref 0.50–1.10)
GFR calc non Af Amer: 36 mL/min — ABNORMAL LOW (ref >90)
GFR calc non Af Amer: 33 mL/min — ABNORMAL LOW (ref >90)
GFR, EST AFRICAN AMERICAN: 38 mL/min — AB (ref >90)
GFR, EST AFRICAN AMERICAN: 41 mL/min — AB (ref >90)
GLUCOSE: 168 mg/dL — AB (ref 70–99)
Glucose, Bld: 107 mg/dL — ABNORMAL HIGH (ref 70–99)
Potassium: 4.7 mmol/L (ref 3.5–5.1)
Potassium: 5.1 mmol/L (ref 3.5–5.1)
SODIUM: 139 mmol/L (ref 135–145)
Sodium: 139 mmol/L (ref 135–145)
TOTAL PROTEIN: 5.3 g/dL — AB (ref 6.0–8.3)
Total Bilirubin: 0.7 mg/dL (ref 0.3–1.2)
Total Bilirubin: 0.6 mg/dL (ref 0.3–1.2)
Total Protein: 5.1 g/dL — ABNORMAL LOW (ref 6.0–8.3)

## 2014-10-11 LAB — CBC WITH DIFFERENTIAL/PLATELET
BASOS ABS: 0.1 10*3/uL (ref 0.0–0.1)
BASOS PCT: 1 % (ref 0–1)
EOS ABS: 0.2 10*3/uL (ref 0.0–0.7)
Eosinophils Relative: 2 % (ref 0–5)
HEMATOCRIT: 35.7 % — AB (ref 36.0–46.0)
Hemoglobin: 11.7 g/dL — ABNORMAL LOW (ref 12.0–15.0)
Lymphocytes Relative: 14 % (ref 12–46)
Lymphs Abs: 1.6 10*3/uL (ref 0.7–4.0)
MCH: 27.6 pg (ref 26.0–34.0)
MCHC: 32.8 g/dL (ref 30.0–36.0)
MCV: 84.2 fL (ref 78.0–100.0)
Monocytes Absolute: 1.4 10*3/uL — ABNORMAL HIGH (ref 0.1–1.0)
Monocytes Relative: 12 % (ref 3–12)
NEUTROS PCT: 71 % (ref 43–77)
Neutro Abs: 8.6 10*3/uL — ABNORMAL HIGH (ref 1.7–7.7)
Platelets: 299 10*3/uL (ref 150–400)
RBC: 4.24 MIL/uL (ref 3.87–5.11)
RDW: 15.9 % — ABNORMAL HIGH (ref 11.5–15.5)
WBC: 11.9 10*3/uL — ABNORMAL HIGH (ref 4.0–10.5)

## 2014-10-11 LAB — GLUCOSE, CAPILLARY
GLUCOSE-CAPILLARY: 126 mg/dL — AB (ref 70–99)
GLUCOSE-CAPILLARY: 124 mg/dL — AB (ref 70–99)
GLUCOSE-CAPILLARY: 87 mg/dL (ref 70–99)
Glucose-Capillary: 156 mg/dL — ABNORMAL HIGH (ref 70–99)

## 2014-10-11 MED ORDER — INSULIN ASPART 100 UNIT/ML ~~LOC~~ SOLN
0.0000 [IU] | Freq: Three times a day (TID) | SUBCUTANEOUS | Status: DC
  Administered 2014-10-12 (×2): 2 [IU] via SUBCUTANEOUS
  Administered 2014-10-13 (×2): 1 [IU] via SUBCUTANEOUS

## 2014-10-11 NOTE — Progress Notes (Signed)
[facility] PROGRESS NOTE  Audrey Reed INO:[id number] DOB: 1918/03/27 DOA: 10/08/2014 PCP: Scarlette Calico, MD  Assessment/Plan:   Enteritis due to Clostridium difficile - C. difficile PCR positive. Continue contact precautions. Advance diet given improvement in condition. Continue oral vancomycin therapy.  Active Problems:   Diabetes -We'll place on diabetic diet. Continue sliding scale insulin. CBG checks before meals and daily at bedtime.    Essential hypertension -Relatively well controlled on beta blocker    Dementia -Stable    Diarrhea - Improving but patient reports still having bouts    AKI (acute kidney injury) - Most likely prerenal etiology and has been improving with IV fluid rehydration and improvement in oral intake. - will saline lock and assess S creatinine next am.  Code Status: Full Family Communication: No family at bedside  Disposition Plan: Pending improvement in condition likely discharge within the next one or 2 days. Of note patient lives alone will order PT eval for disposition planning.   Consultants:  None  Procedures:  None  Antibiotics:  Oral vancomycin  HPI/Subjective: Patient has no new complaints. No acute issues overnight reported.  Objective: Filed Vitals:   10/11/14 0639  BP: 134/66  Pulse: 80  Temp: 98 F (36.7 C)  Resp: 17    Intake/Output Summary (Last 24 hours) at 10/11/14 1403 Last data filed at 10/11/14 0942  Gross per 24 hour  Intake   1060 ml  Output      0 ml  Net   1060 ml   Filed Weights   10/08/14 1815 10/09/14 0105  Weight: 61.236 kg (135 lb) 60.782 kg (134 lb)    Exam:   General:  Pt in nad, alert and awake  Cardiovascular: rrr, no mrg  Respiratory: no wheezes, no increased wob,   Abdomen: NT, ND, no guarding  Musculoskeletal: no cyanosis or clubbing   Data Reviewed: Basic Metabolic Panel:  Recent Labs Lab 10/09/14 0450 10/09/14 1123 10/10/14 1234 10/11/14 0034 10/11/14 1147   NA 135 139 142 139 139  K 5.3* 4.7 4.7 4.7 5.1  CL 106 108 115* 112 110  CO2 22 23 19 21 25   GLUCOSE 119* 129* 117* 107* 168*  BUN 34* 29* 22 24* 22  CREATININE 1.46* 1.36* 1.23* 1.25* 1.33*  CALCIUM 8.3* 8.4 8.3* 8.3* 8.2*   Liver Function Tests:  Recent Labs Lab 10/09/14 0450 10/09/14 1123 10/10/14 1234 10/11/14 0034 10/11/14 1147  AST 29 26 23 24 22   ALT 14 13 13 13 12   ALKPHOS 50 52 47 76 46  BILITOT 0.8 0.6 0.8 0.7 0.6  PROT 5.1* 5.0* 5.2* 5.3* 5.1*  ALBUMIN 2.6* 2.6* 2.5* 2.3* 2.3*   No results for input(s): LIPASE, AMYLASE in the last 168 hours. No results for input(s): AMMONIA in the last 168 hours. CBC:  Recent Labs Lab 10/08/14 1041 10/09/14 0450 10/10/14 0028 10/11/14 0034  WBC 17.9* 15.8* 12.1* 11.9*  NEUTROABS 14.6* 12.1* 9.0* 8.6*  HGB 12.9 12.4 11.7* 11.7*  HCT 38.6 37.7 35.3* 35.7*  MCV 82.7 82.7 83.5 84.2  PLT 372.0 291 278 299   Cardiac Enzymes: No results for input(s): CKTOTAL, CKMB, CKMBINDEX, TROPONINI in the last 168 hours. BNP (last 3 results) No results for input(s): BNP in the last 8760 hours.  ProBNP (last 3 results) No results for input(s): PROBNP in the last 8760 hours.  CBG:  Recent Labs Lab 10/10/14 1133 10/10/14 1639 10/10/14 2202 10/11/14 0729 10/11/14 1312  GLUCAP 120* 149* 135* 126* 124*  Recent Results (from the past 240 hour(s))  Stool Culture     Status: None   Collection Time: 10/05/14 10:12 AM  Result Value Ref Range Status   Organism ID, Bacteria No Salmonella,Shigella,Campylobacter,Yersinia,or  Final   Organism ID, Bacteria No E.coli [id number]:H7 isolated.  Final  Clostridium Difficile by PCR     Status: None   Collection Time: 10/05/14 10:12 AM  Result Value Ref Range Status   C difficile by pcr Not Detected Not Detected Final    Comment: This test is for use only with liquid or soft stools; performance characteristics of other clinical specimen types have not been established.   This assay was  performed by Cepheid GeneXpert(R) PCR. The performance characteristics of this assay have been determined by [employer]. Performance characteristics refer to the analytical performance of the test.   Clostridium Difficile by PCR     Status: Abnormal   Collection Time: 10/08/14 11:31 PM  Result Value Ref Range Status   C difficile by pcr POSITIVE (A) NEGATIVE Final    Comment: CRITICAL RESULT CALLED TO, READ BACK BY AND VERIFIED WITH: T. COOK RN 9:55 10/09/14 ([email or web address])   Stool culture     Status: None (Preliminary result)   Collection Time: 10/09/14  8:50 AM  Result Value Ref Range Status   Specimen Description STOOL  Final   Special Requests NONE  Final   Culture   Final    NO SUSPICIOUS COLONIES, CONTINUING TO HOLD Performed at [employer] [facility]    Report Status PENDING  Incomplete     Studies: No results found.  Scheduled Meds: . antiseptic oral rinse  7 mL Mouth Rinse q12n4p  . carvedilol  12.5 mg Oral BID WC  . chlorhexidine  15 mL Mouth Rinse BID  . heparin  5,000 Units Subcutaneous 3 times per day  . insulin aspart  0-9 Units Subcutaneous 6 times per day  . simvastatin  20 mg Oral q1800  . sodium chloride  3 mL Intravenous Q12H  . vancomycin  125 mg Oral TID AC & HS   Continuous Infusions: . sodium chloride Stopped (10/10/14 1219)     Time spent: > 35 minutes    Velvet Bathe  [facility] Pager [phone] If 7PM-7AM, please contact night-coverage at [email or web address], password [facility] 10/11/2014, 2:03 PM  LOS: 3 days

## 2014-10-11 NOTE — Evaluation (Signed)
Physical Therapy Evaluation Patient Details Name: Audrey Reed MRN: [id number] DOB: [birth year] Today's Date: 10/11/2014   History of Present Illness  This is a 81 y.o. year old female with significant past medical history of HTN, CAD, diverticulosis, type 2 DM presenting with diarrhea. Pt reports persistent perfuse diarrhea over the past 8-12 days. Pt states that sxs began after taking prednisone and flexeril for hip flare. Denies any recent abx use. Pt was seen by PCP for sxs. Was placed on oral flagyl. Sxs failed to improve. Worsening renal function over course of illness, though mildly improving. Was seen by PCP for sxs 3/4 and redirected to ER for worsening sxs.   Past Medical History  Diagnosis Date  . HTN (hypertension)   . CAD (coronary artery disease)   . Venous insufficiency   . Hypercholesterolemia   . DM (diabetes mellitus)   . Diverticulosis of colon   . Colon polyp 06/26/03    9 mm sessile polyp 110 cm from anus; not retrieved  . Adenocarcinoma of breast   . DJD (degenerative joint disease)   . Osteoporosis   . Anxiety disorder   . Dementia   . Dermatophytosis of nail 03/31/2013   Past Surgical History  Procedure Laterality Date  . Cataract extraction    . Mastectomy      for breast cancer     Clinical Impression  Pt admitted with above diagnosis. Pt currently with functional limitations due to the deficits listed below (see PT Problem List).  Pt will benefit from skilled PT to increase their independence and safety with mobility to allow discharge to the venue listed below.    Pt very much wants to dc straight home this admission; Still, given she lives alone and has difficulty with mobility, and in particular ADLs, I believe she will benefit from more post-acute rehab to maximize independence and safety with mobility prior to dc home; It is possible pt will decline SNF, and if that is the case, would recommend 24 hour supervision, and of course HHPT/OT, and HHAide,  and HHRN for chronic disease management.     Follow Up Recommendations SNF;Supervision/Assistance - 24 hour    Equipment Recommendations  Rolling walker with 5" wheels;3in1 (PT)    Recommendations for Other Services OT consult     Precautions / Restrictions Precautions Precautions: Fall Required Braces or Orthoses: Knee Immobilizer - Left Knee Immobilizer - Left: Other (comment) (Pt states her Orthopedist recently issued her the [place])      Mobility  Bed Mobility                  Transfers Overall transfer level: Needs assistance Equipment used: Rolling walker (2 wheeled) Transfers: Sit to/from Stand Sit to Stand: Min assist         General transfer comment: Min assist for steadiness and power-up; cues for safety and hand placement  Ambulation/Gait Ambulation/Gait assistance: Min guard Ambulation Distance (Feet): 50 Feet Assistive device: Rolling walker (2 wheeled) Gait Pattern/deviations: Decreased stride length;Trunk flexed (noted also scoliosis-like curvature) Gait velocity: slowed   General Gait Details: Cues for posture, and to self-monitor for activity tolerance  Stairs            Wheelchair Mobility    Modified Rankin (Stroke Patients Only)       Balance Overall balance assessment: Needs assistance         Standing balance support: Bilateral upper extremity supported Standing balance-Leahy Scale: Poor Standing balance comment: Heavy reliance on UE support  Pertinent Vitals/Pain Pain Assessment: Faces Faces Pain Scale: Hurts little more Pain Location: L knee; pt attributes this to "arthritis in her kneecap" Pain Descriptors / Indicators: Aching Pain Intervention(s): Monitored during session;Repositioned (used pt's KI)    Home Living Family/patient expects to be discharged to:: Private residence Living Arrangements: Alone;Children Available Help at Discharge: Family;Available  PRN/intermittently Type of Home: House Home Access: Stairs to enter Entrance Stairs-Rails: None Entrance Stairs-Number of Steps: 1 Home Layout: One level Home Equipment: Walker - 4 wheels;Cane - quad Additional Comments: Pt tells me her son checks on her multiple times a day    Prior Function Level of Independence: Independent with assistive device(s)               Hand Dominance        Extremity/Trunk Assessment   Upper Extremity Assessment: Generalized weakness           Lower Extremity Assessment: Generalized weakness (and R knee in extension in KI)         Communication   Communication: HOH  Cognition Arousal/Alertness: Awake/alert Behavior During Therapy: WFL for tasks assessed/performed Overall Cognitive Status: Within Functional Limits for tasks assessed                      General Comments      Exercises        Assessment/Plan    PT Assessment Patient needs continued PT services  PT Diagnosis Difficulty walking;Generalized weakness   PT Problem List Decreased strength;Decreased range of motion;Decreased activity tolerance;Decreased balance;Decreased mobility;Decreased coordination;Decreased cognition;Decreased knowledge of use of DME;Decreased safety awareness;Pain  PT Treatment Interventions DME instruction;Gait training;Stair training;Functional mobility training;Therapeutic activities;Therapeutic exercise;Balance training;Neuromuscular re-education;Patient/family education   PT Goals (Current goals can be found in the Care Plan section) Acute Rehab PT Goals Patient Stated Goal: REALLY wanting to get home PT Goal Formulation: With patient Time For Goal Achievement: 10/25/14 Potential to Achieve Goals: Good    Frequency Min 3X/week   Barriers to discharge Decreased caregiver support Lives alone; family checks in on her periodically, but she must be essentially independent to manage at home    Co-evaluation                End of Session Equipment Utilized During Treatment: Gait belt;Left knee immobilizer Activity Tolerance: Patient tolerated treatment well Patient left: in chair;with call bell/phone within reach;with chair alarm set Nurse Communication: Mobility status         Time: 1517-1540 PT Time Calculation (min) (ACUTE ONLY): 23 min   Charges:   PT Evaluation $Initial PT Evaluation Tier I: 1 Procedure PT Treatments $Gait Training: 8-22 mins   PT G Codes:        Quin Hoop 10/11/2014, 4:34 PM  Roney Marion, [place] Pager [phone] Office [phone]

## 2014-10-12 LAB — OVA AND PARASITE EXAMINATION: Ova and parasites: NONE SEEN

## 2014-10-12 LAB — GLUCOSE, CAPILLARY
GLUCOSE-CAPILLARY: 118 mg/dL — AB (ref 70–99)
GLUCOSE-CAPILLARY: 163 mg/dL — AB (ref 70–99)
Glucose-Capillary: 137 mg/dL — ABNORMAL HIGH (ref 70–99)
Glucose-Capillary: 114 mg/dL — ABNORMAL HIGH (ref 70–99)

## 2014-10-12 LAB — BASIC METABOLIC PANEL
ANION GAP: 6 (ref 5–15)
BUN: 21 mg/dL (ref 6–23)
CHLORIDE: 112 mmol/L (ref 96–112)
CO2: 21 mmol/L (ref 19–32)
CREATININE: 1.15 mg/dL — AB (ref 0.50–1.10)
Calcium: 8.3 mg/dL — ABNORMAL LOW (ref 8.4–10.5)
GFR calc non Af Amer: 39 mL/min — ABNORMAL LOW (ref >90)
GFR, EST AFRICAN AMERICAN: 46 mL/min — AB (ref >90)
GLUCOSE: 130 mg/dL — AB (ref 70–99)
Potassium: 4.2 mmol/L (ref 3.5–5.1)
Sodium: 139 mmol/L (ref 135–145)

## 2014-10-12 LAB — CBC
HCT: 36.5 % (ref 36.0–46.0)
Hemoglobin: 11.9 g/dL — ABNORMAL LOW (ref 12.0–15.0)
MCH: 27.5 pg (ref 26.0–34.0)
MCHC: 32.6 g/dL (ref 30.0–36.0)
MCV: 84.3 fL (ref 78.0–100.0)
PLATELETS: 288 10*3/uL (ref 150–400)
RBC: 4.33 MIL/uL (ref 3.87–5.11)
RDW: 16.2 % — AB (ref 11.5–15.5)
WBC: 12.4 10*3/uL — AB (ref 4.0–10.5)

## 2014-10-12 NOTE — Care Management Note (Addendum)
  Page 1 of 1   10/12/2014     10:12:46 AM CARE MANAGEMENT NOTE 10/12/2014  Patient:  Audrey Reed, Audrey Reed   Account Number:  [id number]  Date Initiated:  10/12/2014  Documentation initiated by:  Magdalen Spatz  Subjective/Objective Assessment:     Action/Plan:   Anticipated DC Date:     Anticipated DC Plan:           Choice offered to / List presented to:  C-1 Patient           Status of service:   Medicare Important Message given?  YES (If response is "NO", the following Medicare IM given date fields will be blank) Date Medicare IM given:  10/12/2014 Medicare IM given by:  Magdalen Spatz Date Additional Medicare IM given:   Additional Medicare IM given by:    Discharge Disposition:    Per UR Regulation:  Reviewed for med. necessity/level of care/duration of stay  If discussed at [place] of Stay Meetings, dates discussed:    Comments:  10-12-14 Left message with patient's son Jla Reynolds [phone], to discuss discharge plan. Awaiting call back . Magdalen Spatz RN BSN      10-12-14 PT recommending SNF / 24 hour supervision. Patient aware , refusing SNF ( has done short term rehab before and prefers to discharge to home ) . Patient aware PT recommeding 24 hour supervision , states she lives alone however, son lives next door and frequency checks on her .  Patient has walker at home, needs 3 in 1 . [facility] like [facility]  ( [facility] )  Will need orders.  Magdalen Spatz RN BSN

## 2014-10-12 NOTE — Progress Notes (Signed)
[facility] PROGRESS NOTE  Audrey Reed XHB:[id number] DOB: [birth year] DOA: 10/08/2014 PCP: Scarlette Calico, MD  Assessment/Plan:   Enteritis due to Clostridium difficile - C. difficile PCR positive. Continue contact precautions. Advance diet given improvement in condition. Continue oral vancomycin therapy. - WBC trended up today and PT evaluation pending. Will reassess cbc next am.   Active Problems:   Diabetes - Continue diabetic diet. Continue sliding scale insulin. CBG checks before meals and daily at bedtime.    Essential hypertension -Relatively well controlled on beta blocker    Dementia -Stable    Diarrhea - Still having bouts but less reportedly.    AKI (acute kidney injury) - Most likely prerenal etiology and has been improving with IV fluid rehydration and improvement in oral intake. - will saline lock and assess S creatinine next am.  Code Status: Full Family Communication: No family at bedside  Disposition Plan: Pending improvement in condition likely discharge within the next one or 2 days. Awaiting PT evaluation   Consultants:  None  Procedures:  None  Antibiotics:  Oral vancomycin  HPI/Subjective: Still having some diarrhea  Objective: Filed Vitals:   10/12/14 0630  BP: 132/58  Pulse: 79  Temp: 98.1 F (36.7 C)  Resp: 16    Intake/Output Summary (Last 24 hours) at 10/12/14 1248 Last data filed at 10/12/14 0914  Gross per 24 hour  Intake    720 ml  Output      0 ml  Net    720 ml   Filed Weights   10/08/14 1815 10/09/14 0105  Weight: 61.236 kg (135 lb) 60.782 kg (134 lb)    Exam:   General:  Pt in nad, alert and awake  Cardiovascular: no cyanosis, no upper extremity edema  Respiratory: no wheezes, no increased wob,   Abdomen: NT, ND, no guarding  Musculoskeletal: no cyanosis or clubbing   Data Reviewed: Basic Metabolic Panel:  Recent Labs Lab 10/09/14 1123 10/10/14 1234 10/11/14 0034 10/11/14 1147 10/12/14 0538   NA 139 142 139 139 139  K 4.7 4.7 4.7 5.1 4.2  CL 108 115* 112 110 112  CO2 23 19 21 25 21   GLUCOSE 129* 117* 107* 168* 130*  BUN 29* 22 24* 22 21  CREATININE 1.36* 1.23* 1.25* 1.33* 1.15*  CALCIUM 8.4 8.3* 8.3* 8.2* 8.3*   Liver Function Tests:  Recent Labs Lab 10/09/14 0450 10/09/14 1123 10/10/14 1234 10/11/14 0034 10/11/14 1147  AST 29 26 23 24 22   ALT 14 13 13 13 12   ALKPHOS 50 52 47 76 46  BILITOT 0.8 0.6 0.8 0.7 0.6  PROT 5.1* 5.0* 5.2* 5.3* 5.1*  ALBUMIN 2.6* 2.6* 2.5* 2.3* 2.3*   No results for input(s): LIPASE, AMYLASE in the last 168 hours. No results for input(s): AMMONIA in the last 168 hours. CBC:  Recent Labs Lab 10/08/14 1041 10/09/14 0450 10/10/14 0028 10/11/14 0034 10/12/14 0538  WBC 17.9* 15.8* 12.1* 11.9* 12.4*  NEUTROABS 14.6* 12.1* 9.0* 8.6*  --   HGB 12.9 12.4 11.7* 11.7* 11.9*  HCT 38.6 37.7 35.3* 35.7* 36.5  MCV 82.7 82.7 83.5 84.2 84.3  PLT 372.0 291 278 299 288   Cardiac Enzymes: No results for input(s): CKTOTAL, CKMB, CKMBINDEX, TROPONINI in the last 168 hours. BNP (last 3 results) No results for input(s): BNP in the last 8760 hours.  ProBNP (last 3 results) No results for input(s): PROBNP in the last 8760 hours.  CBG:  Recent Labs Lab 10/11/14 1312 10/11/14 1744 10/11/14  2206 10/12/14 0741 10/12/14 1202  GLUCAP 124* 156* 87 118* 163*    Recent Results (from the past 240 hour(s))  Stool Culture     Status: None   Collection Time: 10/05/14 10:12 AM  Result Value Ref Range Status   Organism ID, Bacteria No Salmonella,Shigella,Campylobacter,Yersinia,or  Final   Organism ID, Bacteria No E.coli 0157:H7 isolated.  Final  Clostridium Difficile by PCR     Status: None   Collection Time: 10/05/14 10:12 AM  Result Value Ref Range Status   C difficile by pcr Not Detected Not Detected Final    Comment: This test is for use only with liquid or soft stools; performance characteristics of other clinical specimen types have not  been established.   This assay was performed by Cepheid GeneXpert(R) PCR. The performance characteristics of this assay have been determined by [facility]. Performance characteristics refer to the analytical performance of the test.   Clostridium Difficile by PCR     Status: Abnormal   Collection Time: 10/08/14 11:31 PM  Result Value Ref Range Status   C difficile by pcr POSITIVE (A) NEGATIVE Final    Comment: CRITICAL RESULT CALLED TO, READ BACK BY AND VERIFIED WITH: T. COOK RN 9:55 10/09/14 ([email or web address])   Stool culture     Status: None (Preliminary result)   Collection Time: 10/09/14  8:50 AM  Result Value Ref Range Status   Specimen Description STOOL  Final   Special Requests NONE  Final   Culture   Final    NO SUSPICIOUS COLONIES, CONTINUING TO HOLD Performed at [facility]    Report Status PENDING  Incomplete     Studies: No results found.  Scheduled Meds: . antiseptic oral rinse  7 mL Mouth Rinse q12n4p  . carvedilol  12.5 mg Oral BID WC  . chlorhexidine  15 mL Mouth Rinse BID  . heparin  5,000 Units Subcutaneous 3 times per day  . insulin aspart  0-9 Units Subcutaneous TID WC & HS  . simvastatin  20 mg Oral q1800  . sodium chloride  3 mL Intravenous Q12H  . vancomycin  125 mg Oral TID AC & HS   Continuous Infusions:     Time spent: > 35 minutes    Velvet Bathe  [facility] Pager [phone] If 7PM-7AM, please contact night-coverage at [email or web address], password [facility] 10/12/2014, 12:48 PM  LOS: 4 days

## 2014-10-12 NOTE — Progress Notes (Signed)
Physical Therapy Treatment Patient Details Name: Audrey Reed MRN: [id number] DOB: 1918/03/14 Today's Date: 10/12/2014    History of Present Illness This is a 81 y.o. year old female with significant past medical history of HTN, CAD, diverticulosis, type 2 DM presenting with diarrhea. Pt reports persistent perfuse diarrhea over the past 8-12 days. Pt states that sxs began after taking prednisone and flexeril for hip flare. Denies any recent abx use. Pt was seen by PCP for sxs. Was placed on oral flagyl. Sxs failed to improve. Worsening renal function over course of illness, though mildly improving. Was seen by PCP for sxs 3/4 and redirected to ER for worsening sxs.     PT Comments    Pt very motivated to improve. Son present and pt now agreeable to short-term SNF for rehab. Son very supportive and encouraging. Pt slightly improved compared to 3/6. Continues to fatigue quickly.  Follow Up Recommendations  SNF;Supervision/Assistance - 24 hour     Equipment Recommendations  Rolling walker with 5" wheels;3in1 (PT)    Recommendations for Other Services OT consult     Precautions / Restrictions Precautions Precautions: Fall Required Braces or Orthoses: Knee Immobilizer - Left Knee Immobilizer - Left: Other (comment) (Orthopedist issued KI to prevent medial patella disloc))    Mobility  Bed Mobility                  Transfers Overall transfer level: Needs assistance Equipment used: Rolling walker (2 wheeled) Transfers: Sit to/from Stand Sit to Stand: Min assist;Min guard         General transfer comment: Min assist initally; with repetition progressed to minguard; vc for safe use of RW  Ambulation/Gait Ambulation/Gait assistance: Min guard Ambulation Distance (Feet): 150 Feet Assistive device: Rolling walker (2 wheeled) Gait Pattern/deviations: Step-to pattern;Antalgic;Trunk flexed Gait velocity: slowed   General Gait Details: Cues for posture, and to self-monitor  for activity tolerance   Stairs            Wheelchair Mobility    Modified Rankin (Stroke Patients Only)       Balance                                    Cognition Arousal/Alertness: Awake/alert Behavior During Therapy: WFL for tasks assessed/performed Overall Cognitive Status: Within Functional Limits for tasks assessed                      Exercises Other Exercises Other Exercises: sit to stand x 4 for strengthening, repetition of proper sequencing    General Comments        Pertinent Vitals/Pain Pain Assessment: No/denies pain    Home Living                      Prior Function            PT Goals (current goals can now be found in the care plan section) Acute Rehab PT Goals Patient Stated Goal: REALLY wanting to get home PT Goal Formulation: With patient Time For Goal Achievement: 10/25/14 Potential to Achieve Goals: Good Progress towards PT goals: Progressing toward goals    Frequency  Min 3X/week    PT Plan Current plan remains appropriate    Co-evaluation             End of Session Equipment Utilized During Treatment: Gait belt;Left knee immobilizer Activity Tolerance: Patient  tolerated treatment well Patient left: in chair;with call bell/phone within reach;with chair alarm set     Time: [phone] PT Time Calculation (min) (ACUTE ONLY): 25 min  Charges:  $Gait Training: 23-37 mins                    G Codes:      Khaleelah Yowell 10/15/2014, 4:39 PM Pager [phone]

## 2014-10-12 NOTE — Care Management (Signed)
10-12-14 Patient's son Blandina Renaldo returned called. Explained to Mr Mapel PT recommendations ( SNF / 24 hour supervision)  and his mother's plan returning home with home health and him checking on her. Mr Haile stated he will discuss this with his mother before final decision is made.  Mr Kemler has NCM direct phone number . Will follow up . Magdalen Spatz RN BSN [phone]

## 2014-10-13 DIAGNOSIS — R4702 Dysphasia: Secondary | ICD-10-CM | POA: Diagnosis not present

## 2014-10-13 DIAGNOSIS — I350 Nonrheumatic aortic (valve) stenosis: Secondary | ICD-10-CM | POA: Diagnosis not present

## 2014-10-13 DIAGNOSIS — I2510 Atherosclerotic heart disease of native coronary artery without angina pectoris: Secondary | ICD-10-CM | POA: Diagnosis not present

## 2014-10-13 DIAGNOSIS — E440 Moderate protein-calorie malnutrition: Secondary | ICD-10-CM | POA: Diagnosis not present

## 2014-10-13 DIAGNOSIS — E46 Unspecified protein-calorie malnutrition: Secondary | ICD-10-CM | POA: Diagnosis not present

## 2014-10-13 DIAGNOSIS — A047 Enterocolitis due to Clostridium difficile: Secondary | ICD-10-CM | POA: Diagnosis not present

## 2014-10-13 DIAGNOSIS — N179 Acute kidney failure, unspecified: Secondary | ICD-10-CM | POA: Diagnosis not present

## 2014-10-13 DIAGNOSIS — F0390 Unspecified dementia without behavioral disturbance: Secondary | ICD-10-CM | POA: Diagnosis not present

## 2014-10-13 DIAGNOSIS — I10 Essential (primary) hypertension: Secondary | ICD-10-CM | POA: Diagnosis not present

## 2014-10-13 DIAGNOSIS — L89159 Pressure ulcer of sacral region, unspecified stage: Secondary | ICD-10-CM | POA: Diagnosis not present

## 2014-10-13 DIAGNOSIS — E119 Type 2 diabetes mellitus without complications: Secondary | ICD-10-CM | POA: Diagnosis not present

## 2014-10-13 DIAGNOSIS — R197 Diarrhea, unspecified: Secondary | ICD-10-CM | POA: Diagnosis not present

## 2014-10-13 LAB — GI PATHOGEN PANEL BY PCR, STOOL
C DIFFICILE TOXIN A/B: NOT DETECTED
CAMPYLOBACTER BY PCR: NOT DETECTED
CRYPTOSPORIDIUM BY PCR: NOT DETECTED
E coli (ETEC) LT/ST: NOT DETECTED
E coli (STEC): NOT DETECTED
E coli 0157 by PCR: NOT DETECTED
G lamblia by PCR: NOT DETECTED
Norovirus GI/GII: NOT DETECTED
ROTAVIRUS A BY PCR: NOT DETECTED
Salmonella by PCR: NOT DETECTED
Shigella by PCR: NOT DETECTED

## 2014-10-13 LAB — GLUCOSE, CAPILLARY
Glucose-Capillary: 124 mg/dL — ABNORMAL HIGH (ref 70–99)
Glucose-Capillary: 150 mg/dL — ABNORMAL HIGH (ref 70–99)

## 2014-10-13 LAB — CBC
HCT: 34.6 % — ABNORMAL LOW (ref 36.0–46.0)
Hemoglobin: 11.1 g/dL — ABNORMAL LOW (ref 12.0–15.0)
MCH: 27.1 pg (ref 26.0–34.0)
MCHC: 32.1 g/dL (ref 30.0–36.0)
MCV: 84.4 fL (ref 78.0–100.0)
Platelets: 285 K/uL (ref 150–400)
RBC: 4.1 MIL/uL (ref 3.87–5.11)
RDW: 16.4 % — ABNORMAL HIGH (ref 11.5–15.5)
WBC: 11 K/uL — ABNORMAL HIGH (ref 4.0–10.5)

## 2014-10-13 LAB — STOOL CULTURE

## 2014-10-13 MED ORDER — VANCOMYCIN 50 MG/ML ORAL SOLUTION: 125.0000 mg | Freq: Three times a day (TID) | ORAL | Status: DC

## 2014-10-13 MED ORDER — FUROSEMIDE 40 MG PO TABS: ORAL_TABLET | ORAL | Status: DC

## 2014-10-13 NOTE — Clinical Social Work Placement (Addendum)
Clinical Social Work Department CLINICAL SOCIAL WORK PLACEMENT NOTE 10/13/2014  Patient:  Audrey Reed, Audrey Reed  Account Number:  [id number] Admit date:  10/08/2014  Clinical Social Worker:  Delrae Sawyers  Date/time:  10/13/2014 10:51 AM  Clinical Social Work is seeking post-discharge placement for this patient at the following level of care:   SKILLED NURSING   (*CSW will update this form in Epic as items are completed)   10/13/2014  Patient/family provided with [place] Department of Clinical Social Work's list of facilities offering this level of care within the geographic area requested by the patient (or if unable, by the patient's family).  10/13/2014  Patient/family informed of their freedom to choose among providers that offer the needed level of care, that participate in Medicare, Medicaid or managed care program needed by the patient, have an available bed and are willing to accept the patient.  10/13/2014  Patient/family informed of [facility]' ownership interest in [facility], as well as of the fact that they are under no obligation to receive care at this facility.  PASARR submitted to EDS on  PASARR number received on   FL2 transmitted to all facilities in geographic area requested by pt/family on  10/13/2014 FL2 transmitted to all facilities within larger geographic area on   Patient informed that his/her managed care company has contracts with or will negotiate with  certain facilities, including the following:     Patient/family informed of bed offers received:  10/13/2014 Patient chooses bed at [facility] Physician recommends and patient chooses bed at    Patient to be transferred to  [facility] on 10/13/2014  Patient to be transferred to facility by [facility] Patient and family notified of transfer on 10/13/2014 Name of family member notified:  Guadalupe Maple  The following physician request were entered in  Epic:   Additional Comments: PASARR previously existing.  Lubertha Sayres, [place] ([phone]) Licensed Clinical Social Worker Orthopedics [phone]) and Surgical [phone])

## 2014-10-13 NOTE — Progress Notes (Signed)
DC to [facility] by ambulance at this time, pt flowers placed at nurses station for son to pick up.

## 2014-10-13 NOTE — Discharge Summary (Signed)
Physician Discharge Summary  Audrey Reed UVO:[id number] DOB: 03-Jan-1918 DOA: 10/08/2014  PCP: Scarlette Calico, MD  Admit date: 10/08/2014 Discharge date: 10/13/2014  Time spent: > 35 minutes  Recommendations for Outpatient Follow-up:  1. Consider reassessing BMP within the next 1 week. 2.  If oral intake low then hold lasix.   Discharge Diagnoses:  Active Problems:   Diabetes   Essential hypertension   Dementia   Diarrhea   AKI (acute kidney injury)   Enteritis due to Clostridium difficile   Discharge Condition: stable  Diet recommendation: Carb modified diet.  Filed Weights   10/08/14 1815 10/09/14 0105  Weight: 61.236 kg (135 lb) 60.782 kg (134 lb)    History of present illness:  Pt is a 81 y/o that presented to the hospital with abdominal discomfort and diarrhea. Diagnosed with C diff colitis.  Hospital Course:  C diff colitis - improved on oral vancomycin - continue florastor - no abdominal pain reported on discharge. - Transition to SNF for continued therapy and Physical therapy   DM - treat with diet (diabeti diet)  Essential htn - treat with carvedilol  Procedures:  None  Consultations:  none  Discharge Exam: Filed Vitals:   10/13/14 0555  BP: 124/70  Pulse: 70  Temp: 98 F (36.7 C)  Resp: 17    General: Pt in nad, alert and awake Cardiovascular: no cyanosis no upper extremity edema Respiratory: breathing comfortably, no wheezes, equal chest rise Abdomen: ND, NT, no guarding.  Discharge Instructions   Discharge Instructions    Call MD for:  extreme fatigue    Complete by:  As directed      Call MD for:  persistant nausea and vomiting    Complete by:  As directed      Call MD for:  redness, tenderness, or signs of infection (pain, swelling, redness, odor or green/yellow discharge around incision site)    Complete by:  As directed      Call MD for:  severe uncontrolled pain    Complete by:  As directed      Call MD for:  temperature  >100.4    Complete by:  As directed      Diet - low sodium heart healthy    Complete by:  As directed      Discharge instructions    Complete by:  As directed   Please have patient follow up with her pcp in 1 week or sooner should any new concerns arise.  Patient will need to take oral vancomycin for 9 more days.     Increase activity slowly    Complete by:  As directed           Current Discharge Medication List    START taking these medications   Details  vancomycin (VANCOCIN) 50 mg/mL oral solution Take 2.5 mLs (125 mg total) by mouth 4 (four) times daily -  before meals and at bedtime.      CONTINUE these medications which have CHANGED   Details  furosemide (LASIX) 40 MG tablet take 1 tablet by mouth every morning Qty: 20 tablet, Refills: 0   Associated Diagnoses: Essential hypertension      CONTINUE these medications which have NOT CHANGED   Details  acetaminophen (TYLENOL) 325 MG tablet Take 2 tablets (650 mg total) by mouth every 6 (six) hours as needed (or Fever >/= 101).    alendronate (FOSAMAX) 70 MG tablet TAKE 1 TABLET EVERY 7 DAYS. TAKE WITH A FULL GLASS  OF WATER ON AN EMPTY STOMACH. Qty: 4 tablet, Refills: 5    carvedilol (COREG) 12.5 MG tablet TAKE 1 TABLET TWICE A DAY Qty: 180 tablet, Refills: 2   Associated Diagnoses: Essential hypertension    Cholecalciferol (VITAMIN D3) 1000 UNITS CAPS Take 2 capsules (2,000 Units total) by mouth daily. Qty: 90 capsule, Refills: 3   Associated Diagnoses: Renal insufficiency    feeding supplement (ENSURE) PUDG Take 1 Container by mouth 3 (three) times daily between meals.    fluticasone (FLONASE) 50 MCG/ACT nasal spray Place 2 sprays into both nostrils daily. Qty: 16 g, Refills: 6   Associated Diagnoses: Other allergic rhinitis    Multiple Vitamins-Minerals (CENTRUM SILVER ADULT 50+ PO) Take 1 tablet by mouth daily.    saccharomyces boulardii (FLORASTOR) 250 MG capsule Take 1 capsule (250 mg total) by mouth  daily. Qty: 30 capsule, Refills: 11    simvastatin (ZOCOR) 20 MG tablet take 1 tablet by mouth at bedtime Qty: 90 tablet, Refills: 3   Associated Diagnoses: Pure hypercholesterolemia      STOP taking these medications     cholestyramine light (PREVALITE) 4 GM/DOSE powder      loperamide (IMODIUM) 2 MG capsule      loratadine (CLARITIN) 10 MG tablet      minoxidil (LONITEN) 10 MG tablet      tiZANidine (ZANAFLEX) 4 MG tablet      traMADol (ULTRAM) 50 MG tablet        Allergies  Allergen Reactions  . Metformin And Related     diarrhea  . Nitrofurantoin     REACTION: unsure of reaction--happened long ago  . Tramadol Nausea Only  . Codeine Nausea And Vomiting, Swelling and Rash      The results of significant diagnostics from this hospitalization (including imaging, microbiology, ancillary and laboratory) are listed below for reference.    Significant Diagnostic Studies: Ct Abdomen Pelvis Wo Contrast  10/08/2014   CLINICAL DATA:  Right lower quadrant pain for 10-12 days  EXAM: CT ABDOMEN AND PELVIS WITHOUT CONTRAST  TECHNIQUE: Multidetector CT imaging of the abdomen and pelvis was performed following the standard protocol without IV contrast.  COMPARISON:  None.  FINDINGS: Lung bases demonstrate bilateral small pleural effusions and mild bibasilar atelectatic changes.  The liver, spleen, pancreas and adrenal glands are within normal limits. The gallbladder is well distended with some dependent gallstones. There are changes suggestive of mild gallbladder wall thickening although this may be accentuated by small amount of ascites.  The kidneys are well visualized and demonstrate multiple hypodensities bilaterally likely representing cysts. The largest of these is on the left measuring 5.1 cm in greatest dimension. A small 1 cm hyperdensity is noted likely representing a hemorrhagic cyst. This is best seen on image number 22 of series 201. No calculi or obstructive changes are noted.   The appendix is not well visualized although no right lower quadrant inflammatory changes are seen to suggest appendicitis. Small amount of ascites is seen. Diverticulosis of the sigmoid colon is noted with significant spur of muscle hypertrophy. No definitive diverticulitis is seen. The bladder is partially distended. No pelvic mass lesion is seen. No acute bony abnormality is noted.  IMPRESSION: Bilateral small pleural effusions with bibasilar atelectasis.  Mild ascites.  Cholelithiasis with changes suggestive of gallbladder wall thickening although this may be accentuated by the mild ascites.  Multiple renal cysts without obstructive change.  Diverticulosis without definitive diverticulitis.  The appendix is not well seen although no significant inflammatory  changes are noted.   Electronically Signed   By: Inez Catalina M.D.   On: 10/08/2014 22:50    Microbiology: Recent Results (from the past 240 hour(s))  Stool Culture     Status: None   Collection Time: 10/05/14 10:12 AM  Result Value Ref Range Status   Organism ID, Bacteria No Salmonella,Shigella,Campylobacter,Yersinia,or  Final   Organism ID, Bacteria No E.coli 0157:H7 isolated.  Final  Clostridium Difficile by PCR     Status: None   Collection Time: 10/05/14 10:12 AM  Result Value Ref Range Status   C difficile by pcr Not Detected Not Detected Final    Comment: This test is for use only with liquid or soft stools; performance characteristics of other clinical specimen types have not been established.   This assay was performed by Cepheid GeneXpert(R) PCR. The performance characteristics of this assay have been determined by [facility]. Performance characteristics refer to the analytical performance of the test.   Clostridium Difficile by PCR     Status: Abnormal   Collection Time: 10/08/14 11:31 PM  Result Value Ref Range Status   C difficile by pcr POSITIVE (A) NEGATIVE Final    Comment: CRITICAL RESULT CALLED TO, READ  BACK BY AND VERIFIED WITH: T. COOK RN 9:55 10/09/14 ([email or web address])   Stool culture     Status: None   Collection Time: 10/09/14  8:50 AM  Result Value Ref Range Status   Specimen Description STOOL  Final   Special Requests NONE  Final   Culture   Final    NO SALMONELLA, SHIGELLA, CAMPYLOBACTER, YERSINIA, OR E.COLI 0157:H7 ISOLATED Performed at [facility]    Report Status 10/13/2014 FINAL  Final  Ova and parasite examination     Status: None   Collection Time: 10/09/14  8:50 AM  Result Value Ref Range Status   Specimen Description STOOL  Final   Special Requests NONE  Final   Ova and parasites   Final    NO OVA OR PARASITES SEEN Performed at [facility]    Report Status 10/12/2014 FINAL  Final     Labs: Basic Metabolic Panel:  Recent Labs Lab 10/09/14 1123 10/10/14 1234 10/11/14 0034 10/11/14 1147 10/12/14 0538  NA 139 142 139 139 139  K 4.7 4.7 4.7 5.1 4.2  CL 108 115* 112 110 112  CO2 23 19 21 25 21   GLUCOSE 129* 117* 107* 168* 130*  BUN 29* 22 24* 22 21  CREATININE 1.36* 1.23* 1.25* 1.33* 1.15*  CALCIUM 8.4 8.3* 8.3* 8.2* 8.3*   Liver Function Tests:  Recent Labs Lab 10/09/14 0450 10/09/14 1123 10/10/14 1234 10/11/14 0034 10/11/14 1147  AST 29 26 23 24 22   ALT 14 13 13 13 12   ALKPHOS 50 52 47 76 46  BILITOT 0.8 0.6 0.8 0.7 0.6  PROT 5.1* 5.0* 5.2* 5.3* 5.1*  ALBUMIN 2.6* 2.6* 2.5* 2.3* 2.3*   No results for input(s): LIPASE, AMYLASE in the last 168 hours. No results for input(s): AMMONIA in the last 168 hours. CBC:  Recent Labs Lab 10/08/14 1041 10/09/14 0450 10/10/14 0028 10/11/14 0034 10/12/14 0538 10/13/14 0545  WBC 17.9* 15.8* 12.1* 11.9* 12.4* 11.0*  NEUTROABS 14.6* 12.1* 9.0* 8.6*  --   --   HGB 12.9 12.4 11.7* 11.7* 11.9* 11.1*  HCT 38.6 37.7 35.3* 35.7* 36.5 34.6*  MCV 82.7 82.7 83.5 84.2 84.3 84.4  PLT 372.0 291 278 299 288 285   Cardiac Enzymes: No results for input(s):  CKTOTAL, CKMB, CKMBINDEX, TROPONINI in the  last 168 hours. BNP: BNP (last 3 results) No results for input(s): BNP in the last 8760 hours.  ProBNP (last 3 results) No results for input(s): PROBNP in the last 8760 hours.  CBG:  Recent Labs Lab 10/12/14 0741 10/12/14 1202 10/12/14 1741 10/12/14 2138 10/13/14 0754  GLUCAP 118* 163* 137* 114* 124*       Signed:  Velvet Bathe  [facility] 10/13/2014, 11:03 AM

## 2014-10-13 NOTE — Progress Notes (Signed)
Report called to [facility] at [facility]

## 2014-10-13 NOTE — Clinical Social Work Psychosocial (Signed)
Clinical Social Work Department BRIEF PSYCHOSOCIAL ASSESSMENT 10/13/2014  Patient:  Audrey Reed, Audrey Reed     Account Number:  [id number]     Admit date:  10/08/2014  Clinical Social Worker:  Delrae Sawyers  Date/Time:  10/13/2014 10:46 AM  Referred by:  Physician  Date Referred:  10/13/2014 Referred for  SNF Placement   Other Referral:   none.   Interview type:  Patient Other interview type:   none.    PSYCHOSOCIAL DATA Living Status:  ALONE Admitted from facility:   Level of care:   Primary support name:  Halyn Flaugher Primary support relationship to patient:  CHILD, ADULT Degree of support available:   Strong support system.    CURRENT CONCERNS Current Concerns  Post-Acute Placement   Other Concerns:   none.    SOCIAL WORK ASSESSMENT / PLAN CSW received referral for possible SNF placement at time of discharge. Patient previously actively refusing SNF placement, stating patient will discharge home with assistance of patient's son. RNCM informed patient's son of patient's request to discharge home once medically stable. Per chart review, patient's son met with patient at bedside and discussed patient's discharge disposition. Patient currently agreeable to SNF placement at time of discharge.    CSW met with patient at bedside to discuss discharge disposition. Patient informed CSW patient will be discharged to SNF (preferably [facility]) once medically stable for discharge. Patient expressed no concerns regarding completing short-term rehabilitation at SNF. Patient requested CSW update patient's son, Sonia Side, once SNF bed offers were available.    CSW to continue to follow and assist with discharge planning needs.   Assessment/plan status:  Psychosocial Support/Ongoing Assessment of Needs Other assessment/ plan:   none.   Information/referral to community resources:   Patient will be discharged to [facility] [place] once medically stable.     PATIENT'S/FAMILY'S RESPONSE TO PLAN OF CARE: Patient understanding and agreeable to CSW plan of care. Patient expressed no further questions or concerns at this time.       Lubertha Sayres, [place] [phone]) Licensed Clinical Social Worker Orthopedics [phone]) and Surgical [phone])

## 2014-10-13 NOTE — Discharge Planning (Signed)
CSW to be discharged to [facility]. Patient's son, Chelsae Zanella, updated regarding discharge.  Facility: [facility] Report number: [phone] Transportation: EMS Corey Harold) scheduled for 3pm pick-up per RN request  Lubertha Sayres, [place] ([phone]) Licensed Clinical Social Worker Orthopedics [phone]) and Surgical [phone])

## 2014-10-16 ENCOUNTER — Telehealth: Payer: Self-pay | Admitting: *Deleted

## 2014-10-16 NOTE — Telephone Encounter (Signed)
Called pt son Sonia Side) no answer LMOM RTC need to set TCM appt up for mom...Johny Chess

## 2014-10-21 NOTE — Telephone Encounter (Signed)
Never receive cal back closing encounter...Audrey Reed

## 2014-10-28 ENCOUNTER — Ambulatory Visit: Payer: Medicare Other | Admitting: Internal Medicine

## 2014-11-29 DIAGNOSIS — I2510 Atherosclerotic heart disease of native coronary artery without angina pectoris: Secondary | ICD-10-CM | POA: Diagnosis not present

## 2014-11-29 DIAGNOSIS — R197 Diarrhea, unspecified: Secondary | ICD-10-CM | POA: Diagnosis not present

## 2014-11-29 DIAGNOSIS — E46 Unspecified protein-calorie malnutrition: Secondary | ICD-10-CM | POA: Diagnosis not present

## 2014-12-02 ENCOUNTER — Telehealth: Payer: Self-pay | Admitting: Internal Medicine

## 2014-12-02 NOTE — Telephone Encounter (Signed)
Caller name: Olean Ree Relation to pt: [facility]  Call back number: [phone]   Reason for call: Olean Ree from [facility] is calling wanting to schedule an apt for consult for EGD vs. Barium Swallow.  Pt is having trouble swallowing, reflux, bouts of C-diff, and feelings of fullness.  Pt is 81 years of age and son is Health Care POA.  Advise and contact Olean Ree for scheduling.    Pt had apt sched in 2014 but canceled due to weather.

## 2014-12-02 NOTE — Telephone Encounter (Signed)
Spoke with Olean Ree at [facility] and she wants to talk with patient's son about symptoms and possible OV. She will call back if he wants to schedule OV.

## 2014-12-02 NOTE — Telephone Encounter (Signed)
Left a message for Olean Ree to call back.

## 2014-12-06 DIAGNOSIS — N179 Acute kidney failure, unspecified: Secondary | ICD-10-CM | POA: Diagnosis not present

## 2014-12-06 DIAGNOSIS — E119 Type 2 diabetes mellitus without complications: Secondary | ICD-10-CM | POA: Diagnosis not present

## 2014-12-06 DIAGNOSIS — R197 Diarrhea, unspecified: Secondary | ICD-10-CM | POA: Diagnosis not present

## 2014-12-06 DIAGNOSIS — I10 Essential (primary) hypertension: Secondary | ICD-10-CM | POA: Diagnosis not present

## 2014-12-06 DIAGNOSIS — F0390 Unspecified dementia without behavioral disturbance: Secondary | ICD-10-CM | POA: Diagnosis not present

## 2014-12-06 DIAGNOSIS — A047 Enterocolitis due to Clostridium difficile: Secondary | ICD-10-CM | POA: Diagnosis not present

## 2014-12-06 DIAGNOSIS — E440 Moderate protein-calorie malnutrition: Secondary | ICD-10-CM | POA: Diagnosis not present

## 2014-12-17 IMAGING — CR DG CHEST 1V PORT
1 series · 1 of 1 positions shown · non-contrast
Comparison: 08/07/2012; 07/27/2012; 08/04/2012

CLINICAL DATA: Shortness of breath, weakness, confusion

PORTABLE CHEST - 1 VIEW

[AP]
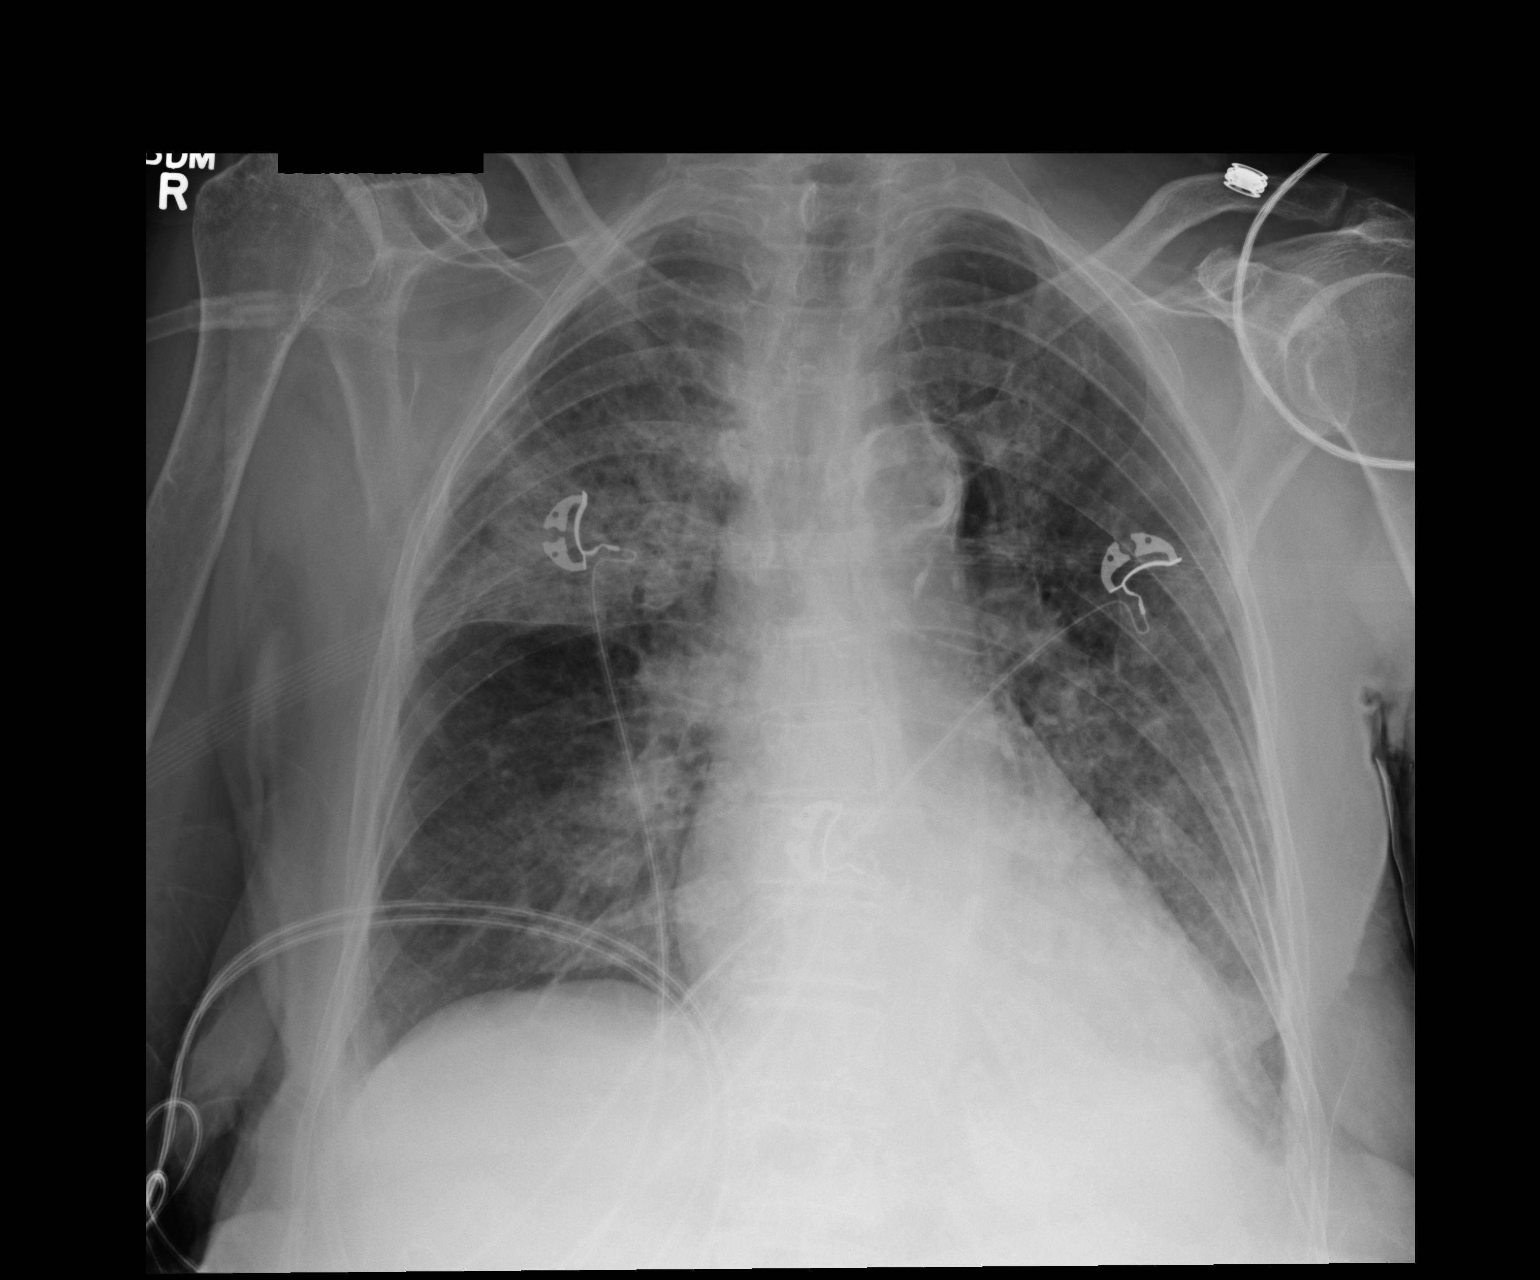

[1 of 1 positions shown; findings below may reference images not displayed]

FINDINGS: Grossly unchanged cardiac silhouette and mediastinal
contours with atherosclerotic calcifications within the thoracic
aorta.  Minimal improved aeration of the bilateral lower lungs with
persistent ill-defined heterogeneous opacities in the right
mid/upper lung and left lower lung. No new focal airspace
opacities. Query trace bilateral pleural effusions.  No
pneumothorax.  Unchanged bones.
IMPRESSION: 1.  Grossly unchanged findings of multifocal infection.
2.  Minimally improved right basilar atelectasis.

## 2014-12-19 IMAGING — CR DG CHEST 1V PORT
1 series · 1 of 1 positions shown · non-contrast
Comparison: 08/09/2012

CLINICAL DATA: Pneumonia

PORTABLE CHEST - 1 VIEW

[AP]
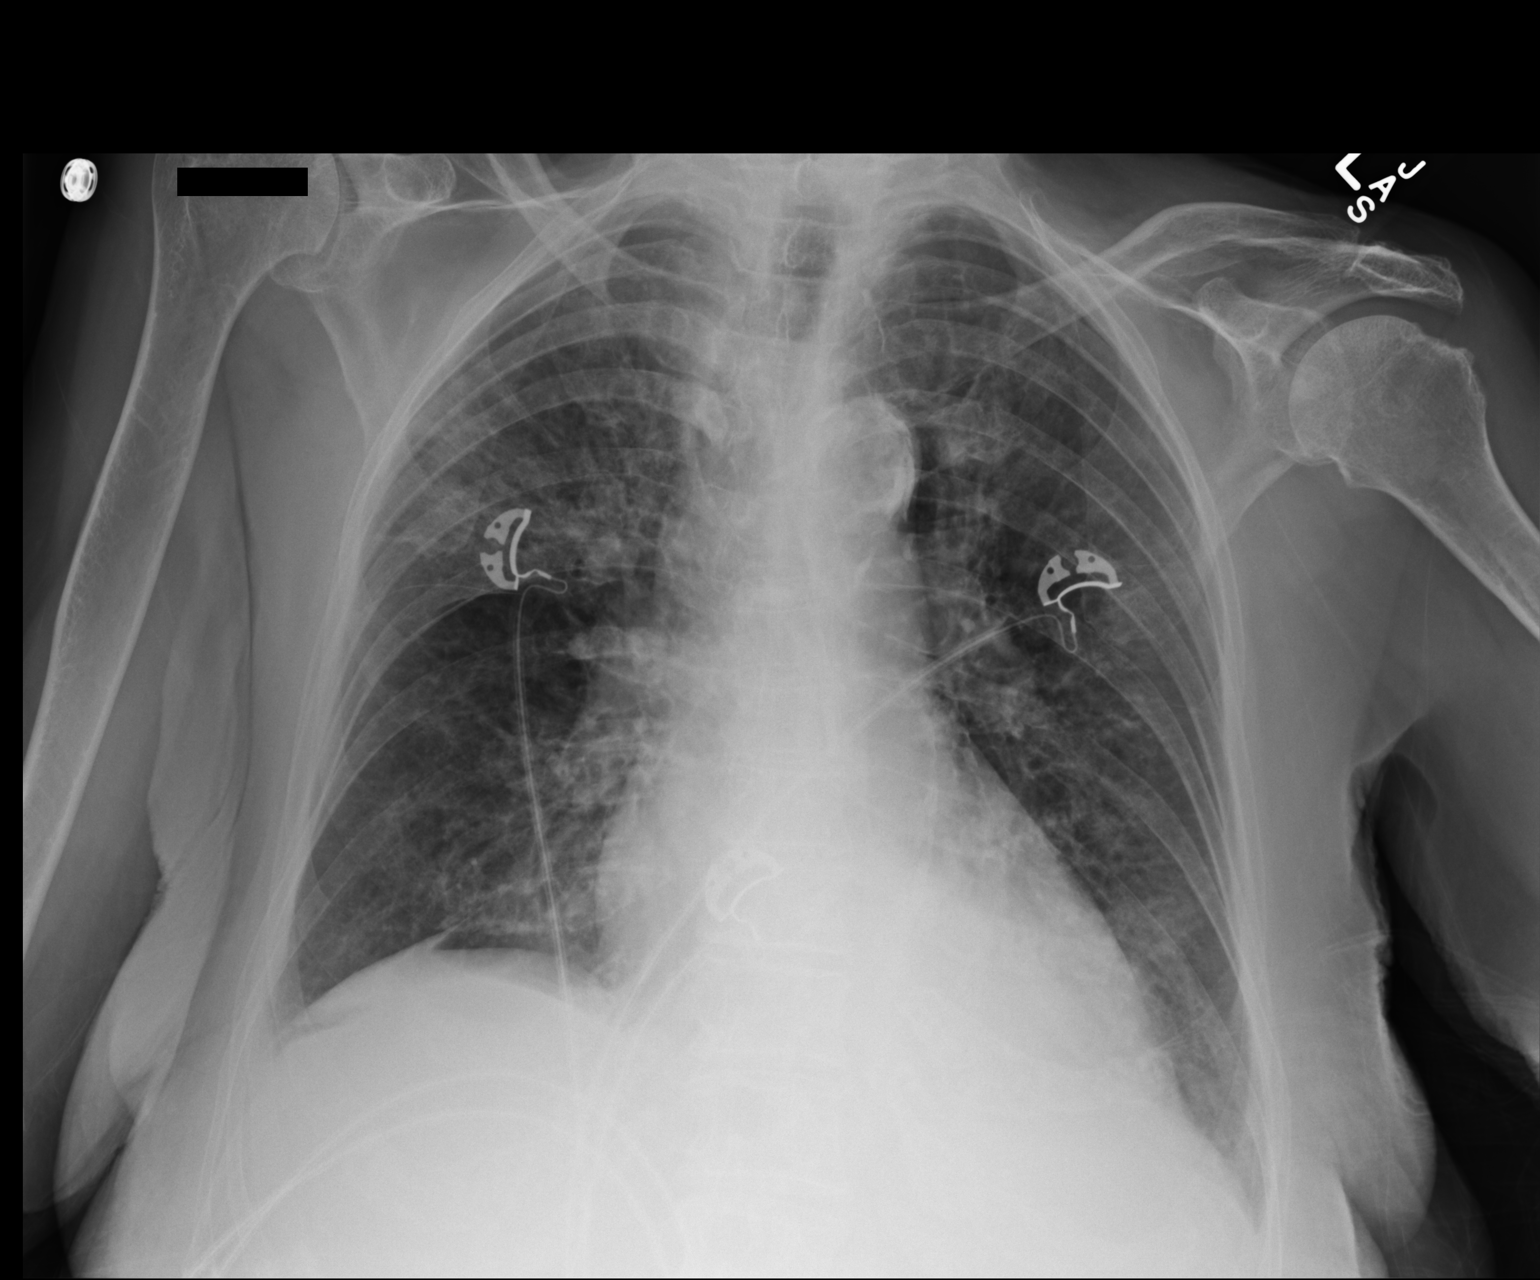

[1 of 1 positions shown; findings below may reference images not displayed]

FINDINGS: Right upper lobe airspace opacity and bibasilar opacities
have improved.  Right lower lobe curvilinear atelectasis or
possibly subpulmonic fluid noted.  Heart size is upper limits of
normal.
IMPRESSION: Improvement in multilobar airspace consolidation, likely improving
pneumonia.  Radiographic resolution [REDACTED]weeks and complete
resolution may not be visible before that time.

## 2015-01-07 ENCOUNTER — Telehealth: Payer: Self-pay | Admitting: Internal Medicine

## 2015-01-07 DIAGNOSIS — N179 Acute kidney failure, unspecified: Secondary | ICD-10-CM | POA: Diagnosis not present

## 2015-01-07 DIAGNOSIS — F0390 Unspecified dementia without behavioral disturbance: Secondary | ICD-10-CM | POA: Diagnosis not present

## 2015-01-07 DIAGNOSIS — J45909 Unspecified asthma, uncomplicated: Secondary | ICD-10-CM | POA: Diagnosis not present

## 2015-01-07 DIAGNOSIS — R197 Diarrhea, unspecified: Secondary | ICD-10-CM | POA: Diagnosis not present

## 2015-01-07 DIAGNOSIS — I10 Essential (primary) hypertension: Secondary | ICD-10-CM | POA: Diagnosis not present

## 2015-01-07 DIAGNOSIS — E119 Type 2 diabetes mellitus without complications: Secondary | ICD-10-CM | POA: Diagnosis not present

## 2015-01-07 NOTE — Telephone Encounter (Signed)
[name] from [facility] health is needing verbal authorization for home health nursing. Patients heart rate is 47 bp 108/60.  When patient was discharged she had 2 different med lists. One list had carvedilol (COREG) 12.5 MG tablet [[id number] on it and the other list didn't. She needs to know if patient needs to be taking this. The patient has NOT been taking it.

## 2015-01-08 NOTE — Telephone Encounter (Signed)
Nurse given verbal approval for both and advised that medication still shows active

## 2015-01-08 NOTE — Telephone Encounter (Signed)
Audrey Reed from [facility] is needing verbal authorization physical therapy for 3x wk 1 wk and 2x wk for 1 wk. She stated you can give Audrey Reed at [phone] the authorization regarding this also

## 2015-01-11 DIAGNOSIS — E119 Type 2 diabetes mellitus without complications: Secondary | ICD-10-CM | POA: Diagnosis not present

## 2015-01-11 DIAGNOSIS — R197 Diarrhea, unspecified: Secondary | ICD-10-CM | POA: Diagnosis not present

## 2015-01-11 DIAGNOSIS — F0390 Unspecified dementia without behavioral disturbance: Secondary | ICD-10-CM | POA: Diagnosis not present

## 2015-01-11 DIAGNOSIS — N179 Acute kidney failure, unspecified: Secondary | ICD-10-CM | POA: Diagnosis not present

## 2015-01-11 DIAGNOSIS — J45909 Unspecified asthma, uncomplicated: Secondary | ICD-10-CM | POA: Diagnosis not present

## 2015-01-11 DIAGNOSIS — I10 Essential (primary) hypertension: Secondary | ICD-10-CM | POA: Diagnosis not present

## 2015-01-12 ENCOUNTER — Ambulatory Visit: Payer: Medicare Other | Admitting: Internal Medicine

## 2015-01-13 DIAGNOSIS — F0390 Unspecified dementia without behavioral disturbance: Secondary | ICD-10-CM | POA: Diagnosis not present

## 2015-01-13 DIAGNOSIS — R197 Diarrhea, unspecified: Secondary | ICD-10-CM | POA: Diagnosis not present

## 2015-01-13 DIAGNOSIS — N179 Acute kidney failure, unspecified: Secondary | ICD-10-CM | POA: Diagnosis not present

## 2015-01-13 DIAGNOSIS — J45909 Unspecified asthma, uncomplicated: Secondary | ICD-10-CM | POA: Diagnosis not present

## 2015-01-13 DIAGNOSIS — I10 Essential (primary) hypertension: Secondary | ICD-10-CM | POA: Diagnosis not present

## 2015-01-13 DIAGNOSIS — E119 Type 2 diabetes mellitus without complications: Secondary | ICD-10-CM | POA: Diagnosis not present

## 2015-01-14 ENCOUNTER — Ambulatory Visit (INDEPENDENT_AMBULATORY_CARE_PROVIDER_SITE_OTHER): Payer: Medicare Other | Admitting: Internal Medicine

## 2015-01-14 ENCOUNTER — Other Ambulatory Visit (INDEPENDENT_AMBULATORY_CARE_PROVIDER_SITE_OTHER): Payer: Medicare Other

## 2015-01-14 ENCOUNTER — Encounter: Payer: Self-pay | Admitting: Internal Medicine

## 2015-01-14 VITALS — BP 140/88 | HR 65 | Temp 98.0°F | Resp 16 | Ht 62.0 in | Wt 119.0 lb

## 2015-01-14 DIAGNOSIS — D510 Vitamin B12 deficiency anemia due to intrinsic factor deficiency: Secondary | ICD-10-CM

## 2015-01-14 DIAGNOSIS — E118 Type 2 diabetes mellitus with unspecified complications: Secondary | ICD-10-CM | POA: Diagnosis not present

## 2015-01-14 DIAGNOSIS — N289 Disorder of kidney and ureter, unspecified: Secondary | ICD-10-CM | POA: Diagnosis not present

## 2015-01-14 DIAGNOSIS — I10 Essential (primary) hypertension: Secondary | ICD-10-CM

## 2015-01-14 DIAGNOSIS — A047 Enterocolitis due to Clostridium difficile: Secondary | ICD-10-CM

## 2015-01-14 DIAGNOSIS — A0472 Enterocolitis due to Clostridium difficile, not specified as recurrent: Secondary | ICD-10-CM

## 2015-01-14 LAB — COMPREHENSIVE METABOLIC PANEL
ALBUMIN: 4.6 g/dL (ref 3.5–5.2)
ALK PHOS: 47 U/L (ref 39–117)
ALT: 11 U/L (ref 0–35)
AST: 15 U/L (ref 0–37)
BILIRUBIN TOTAL: 0.5 mg/dL (ref 0.2–1.2)
BUN: 43 mg/dL — ABNORMAL HIGH (ref 6–23)
CALCIUM: 10.2 mg/dL (ref 8.4–10.5)
CHLORIDE: 101 meq/L (ref 96–112)
CO2: 27 mEq/L (ref 19–32)
Creatinine, Ser: 1.36 mg/dL — ABNORMAL HIGH (ref 0.40–1.20)
GFR: 38.39 mL/min — ABNORMAL LOW (ref >60.00)
Glucose, Bld: 112 mg/dL — ABNORMAL HIGH (ref 70–99)
Potassium: 4.5 mEq/L (ref 3.5–5.1)
SODIUM: 139 meq/L (ref 135–145)
TOTAL PROTEIN: 7.8 g/dL (ref 6.0–8.3)

## 2015-01-14 LAB — CBC WITH DIFFERENTIAL/PLATELET
Basophils Absolute: 0 10*3/uL (ref 0.0–0.1)
Basophils Relative: 0.3 % (ref 0.0–3.0)
EOS ABS: 0.2 10*3/uL (ref 0.0–0.7)
Eosinophils Relative: 1.3 % (ref 0.0–5.0)
HCT: 46.1 % — ABNORMAL HIGH (ref 36.0–46.0)
Hemoglobin: 15.1 g/dL — ABNORMAL HIGH (ref 12.0–15.0)
LYMPHS ABS: 3.3 10*3/uL (ref 0.7–4.0)
Lymphocytes Relative: 23.9 % (ref 12.0–46.0)
MCHC: 32.7 g/dL (ref 30.0–36.0)
MCV: 83.8 fl (ref 78.0–100.0)
MONO ABS: 1.1 10*3/uL — AB (ref 0.1–1.0)
Monocytes Relative: 8.1 % (ref 3.0–12.0)
Neutro Abs: 9.1 10*3/uL — ABNORMAL HIGH (ref 1.4–7.7)
Neutrophils Relative %: 66.4 % (ref 43.0–77.0)
Platelets: 234 10*3/uL (ref 150.0–400.0)
RBC: 5.5 Mil/uL — AB (ref 3.87–5.11)
RDW: 17.2 % — ABNORMAL HIGH (ref 11.5–15.5)
WBC: 13.7 10*3/uL — ABNORMAL HIGH (ref 4.0–10.5)

## 2015-01-14 LAB — URINALYSIS, ROUTINE W REFLEX MICROSCOPIC
Bilirubin Urine: NEGATIVE
Ketones, ur: NEGATIVE
NITRITE: NEGATIVE
PH: 5.5 (ref 5.0–8.0)
SPECIFIC GRAVITY, URINE: 1.015 (ref 1.000–1.030)
Total Protein, Urine: NEGATIVE
Urine Glucose: NEGATIVE
Urobilinogen, UA: 0.2 (ref 0.0–1.0)

## 2015-01-14 LAB — TSH: TSH: 2.14 u[IU]/mL (ref 0.35–4.50)

## 2015-01-14 LAB — HEMOGLOBIN A1C: Hgb A1c MFr Bld: 5.7 % (ref 4.6–6.5)

## 2015-01-14 LAB — IBC PANEL
Iron: 26 ug/dL — ABNORMAL LOW (ref 42–145)
Saturation Ratios: 7.3 % — ABNORMAL LOW (ref 20.0–50.0)
Transferrin: 255 mg/dL (ref 212.0–360.0)

## 2015-01-14 LAB — FOLATE: Folate: >24.8 ng/mL (ref >5.9)

## 2015-01-14 LAB — VITAMIN B12: VITAMIN B 12: 724 pg/mL (ref 211–911)

## 2015-01-14 LAB — FERRITIN: FERRITIN: 50.7 ng/mL (ref 10.0–291.0)

## 2015-01-14 MED ORDER — FERROUS SULFATE 325 (65 FE) MG PO TABS: 325.0000 mg | ORAL_TABLET | Freq: Two times a day (BID) | ORAL | Status: DC

## 2015-01-14 MED ORDER — CARVEDILOL 12.5 MG PO TABS: 12.5000 mg | ORAL_TABLET | Freq: Two times a day (BID) | ORAL | Status: DC

## 2015-01-14 NOTE — Progress Notes (Signed)
Pre visit review using our clinic review tool, if applicable. No additional management support is needed unless otherwise documented below in the visit note. 

## 2015-01-14 NOTE — Patient Instructions (Signed)
Anemia, Nonspecific Anemia is a condition in which the concentration of red blood cells or hemoglobin in the blood is below normal. Hemoglobin is a substance in red blood cells that carries oxygen to the tissues of the body. Anemia results in not enough oxygen reaching these tissues.  CAUSES  Common causes of anemia include:   Excessive bleeding. Bleeding may be internal or external. This includes excessive bleeding from periods (in women) or from the intestine.   Poor nutrition.   Chronic kidney, thyroid, and liver disease.  Bone marrow disorders that decrease red blood cell production.  Cancer and treatments for cancer.  HIV, AIDS, and their treatments.  Spleen problems that increase red blood cell destruction.  Blood disorders.  Excess destruction of red blood cells due to infection, medicines, and autoimmune disorders. SIGNS AND SYMPTOMS   Minor weakness.   Dizziness.   Headache.  Palpitations.   Shortness of breath, especially with exercise.   Paleness.  Cold sensitivity.  Indigestion.  Nausea.  Difficulty sleeping.  Difficulty concentrating. Symptoms may occur suddenly or they may develop slowly.  DIAGNOSIS  Additional blood tests are often needed. These help your health care provider determine the best treatment. Your health care provider will check your stool for blood and look for other causes of blood loss.  TREATMENT  Treatment varies depending on the cause of the anemia. Treatment can include:   Supplements of iron, vitamin B12, or folic acid.   Hormone medicines.   A blood transfusion. This may be needed if blood loss is severe.   Hospitalization. This may be needed if there is significant continual blood loss.   Dietary changes.  Spleen removal. HOME CARE INSTRUCTIONS Keep all follow-up appointments. It often takes many weeks to correct anemia, and having your health care provider check on your condition and your response to  treatment is very important. SEEK IMMEDIATE MEDICAL CARE IF:   You develop extreme weakness, shortness of breath, or chest pain.   You become dizzy or have trouble concentrating.  You develop heavy vaginal bleeding.   You develop a rash.   You have bloody or black, tarry stools.   You faint.   You vomit up blood.   You vomit repeatedly.   You have abdominal pain.  You have a fever or persistent symptoms for more than 2-3 days.   You have a fever and your symptoms suddenly get worse.   You are dehydrated.  MAKE SURE YOU:  Understand these instructions.  Will watch your condition.  Will get help right away if you are not doing well or get worse. Document Released: 08/31/2004 Document Revised: 03/26/2013 Document Reviewed: 01/17/2013 ExitCare Patient Information 2015 [employer]. This information is not intended to replace advice given to you by your health care provider. Make sure you discuss any questions you have with your health care provider.  

## 2015-01-14 NOTE — Progress Notes (Signed)
Subjective:  Patient ID: Audrey Reed, female    DOB: [birth year]  Age: 81 y.o. MRN: [id number]  CC: Hypertension and Anemia   HPI Soleil O Tirado presents for f/up after a lengthy stay in rehab recovering from a C diff infection, she feels much better and tells me that her stools are nearly normal with a rare "mushy" stool. Today she complains of thinning hair and dry skin.  Outpatient Prescriptions Prior to Visit  Medication Sig Dispense Refill  . acetaminophen (TYLENOL) 325 MG tablet Take 2 tablets (650 mg total) by mouth every 6 (six) hours as needed (or Fever >/= 101).    . Cholecalciferol (VITAMIN D3) 1000 UNITS CAPS Take 2 capsules (2,000 Units total) by mouth daily. 90 capsule 3  . feeding supplement (ENSURE) PUDG Take 1 Container by mouth 3 (three) times daily between meals.    . fluticasone (FLONASE) 50 MCG/ACT nasal spray Place 2 sprays into both nostrils daily. 16 g 6  . furosemide (LASIX) 40 MG tablet take 1 tablet by mouth every morning 20 tablet 0  . saccharomyces boulardii (FLORASTOR) 250 MG capsule Take 1 capsule (250 mg total) by mouth daily. 30 capsule 11  . simvastatin (ZOCOR) 20 MG tablet take 1 tablet by mouth at bedtime (Patient taking differently: Take 20 mg by mouth at bedtime. ) 90 tablet 3  . alendronate (FOSAMAX) 70 MG tablet TAKE 1 TABLET EVERY 7 DAYS. TAKE WITH A FULL GLASS OF WATER ON AN EMPTY STOMACH. (Patient taking differently: Take 70 mg by mouth once a week. TAKE WITH A FULL GLASS OF WATER ON AN EMPTY STOMACH.) 4 tablet 5  . carvedilol (COREG) 12.5 MG tablet TAKE 1 TABLET TWICE A DAY (Patient taking differently: Take 12.5 mg by mouth 2 (two) times daily with a meal. ) 180 tablet 2  . vancomycin (VANCOCIN) 50 mg/mL oral solution Take 2.5 mLs (125 mg total) by mouth 4 (four) times daily -  before meals and at bedtime.    . Multiple Vitamins-Minerals (CENTRUM SILVER ADULT 50+ PO) Take 1 tablet by mouth daily.     No facility-administered medications prior to  visit.    ROS Review of Systems  Constitutional: Negative.  Negative for fever, chills, diaphoresis, appetite change, fatigue and unexpected weight change.  HENT: Negative.  Negative for trouble swallowing and voice change.   Eyes: Negative.   Respiratory: Negative.  Negative for cough, choking, chest tightness, shortness of breath and stridor.   Cardiovascular: Negative.  Negative for chest pain, palpitations and leg swelling.  Gastrointestinal: Negative.  Negative for nausea, vomiting, abdominal pain, diarrhea, constipation and blood in stool.  Endocrine: Negative.   Genitourinary: Negative.  Negative for dysuria, urgency, hematuria, flank pain, decreased urine volume and difficulty urinating.  Musculoskeletal: Negative.  Negative for myalgias, back pain, joint swelling and arthralgias.  Skin: Negative.  Negative for rash.  Allergic/Immunologic: Negative.   Neurological: Negative.   Hematological: Negative.  Negative for adenopathy. Does not bruise/bleed easily.  Psychiatric/Behavioral: Negative.     Objective:  BP 140/88 mmHg  Pulse 65  Temp(Src) 98 F (36.7 C) (Oral)  Resp 16  Ht 5\' 2"  (1.575 m)  Wt 119 lb (53.978 kg)  BMI 21.76 kg/m2  SpO2 99%  BP Readings from Last 3 Encounters:  01/14/15 140/88  10/13/14 124/70  10/08/14 140/80    Wt Readings from Last 3 Encounters:  01/14/15 119 lb (53.978 kg)  10/09/14 134 lb (60.782 kg)  10/02/14 135 lb (61.236 kg)  Physical Exam  Constitutional: She is oriented to person, place, and time. She appears well-developed and well-nourished. No distress.  HENT:  Head: Normocephalic and atraumatic.  Mouth/Throat: No oropharyngeal exudate.  Eyes: Conjunctivae are normal. Right eye exhibits no discharge. Left eye exhibits no discharge. No scleral icterus.  Neck: Normal range of motion. Neck supple. No JVD present. No tracheal deviation present. No thyromegaly present.  Cardiovascular: Normal rate, regular rhythm, normal heart  sounds and intact distal pulses.  Exam reveals no gallop and no friction rub.   No murmur heard. Pulmonary/Chest: Effort normal and breath sounds normal. No stridor. No respiratory distress. She has no wheezes. She has no rales. She exhibits no tenderness.  Abdominal: Soft. Bowel sounds are normal. She exhibits no distension and no mass. There is no tenderness. There is no rebound and no guarding.  Musculoskeletal: Normal range of motion. She exhibits no edema or tenderness.  Lymphadenopathy:    She has no cervical adenopathy.  Neurological: She is oriented to person, place, and time.  Skin: Skin is warm and dry. No rash noted. She is not diaphoretic. No erythema. No pallor.  Psychiatric: She has a normal mood and affect. Judgment and thought content normal. Her mood appears not anxious. Her affect is not angry, not blunt, not labile and not inappropriate. Her speech is delayed and tangential. She is slowed and withdrawn. Cognition and memory are impaired. She does not exhibit a depressed mood. She exhibits abnormal recent memory and abnormal remote memory. She is inattentive.  Vitals reviewed.   Lab Results  Component Value Date   WBC 13.7* 01/14/2015   HGB 15.1* 01/14/2015   HCT 46.1* 01/14/2015   PLT 234.0 01/14/2015   GLUCOSE 112* 01/14/2015   CHOL 164 04/28/2014   TRIG 65.0 04/28/2014   HDL 75.20 04/28/2014   LDLCALC 76 04/28/2014   ALT 11 01/14/2015   AST 15 01/14/2015   NA 139 01/14/2015   K 4.5 01/14/2015   CL 101 01/14/2015   CREATININE 1.36* 01/14/2015   BUN 43* 01/14/2015   CO2 27 01/14/2015   TSH 2.14 01/14/2015   INR 1.31 08/04/2012   HGBA1C 5.7 01/14/2015    Ct Abdomen Pelvis Wo Contrast  10/08/2014   CLINICAL DATA:  Right lower quadrant pain for 10-12 days  EXAM: CT ABDOMEN AND PELVIS WITHOUT CONTRAST  TECHNIQUE: Multidetector CT imaging of the abdomen and pelvis was performed following the standard protocol without IV contrast.  COMPARISON:  None.  FINDINGS: Lung  bases demonstrate bilateral small pleural effusions and mild bibasilar atelectatic changes.  The liver, spleen, pancreas and adrenal glands are within normal limits. The gallbladder is well distended with some dependent gallstones. There are changes suggestive of mild gallbladder wall thickening although this may be accentuated by small amount of ascites.  The kidneys are well visualized and demonstrate multiple hypodensities bilaterally likely representing cysts. The largest of these is on the left measuring 5.1 cm in greatest dimension. A small 1 cm hyperdensity is noted likely representing a hemorrhagic cyst. This is best seen on image number 22 of series 201. No calculi or obstructive changes are noted.  The appendix is not well visualized although no right lower quadrant inflammatory changes are seen to suggest appendicitis. Small amount of ascites is seen. Diverticulosis of the sigmoid colon is noted with significant spur of muscle hypertrophy. No definitive diverticulitis is seen. The bladder is partially distended. No pelvic mass lesion is seen. No acute bony abnormality is noted.  IMPRESSION: Bilateral  small pleural effusions with bibasilar atelectasis.  Mild ascites.  Cholelithiasis with changes suggestive of gallbladder wall thickening although this may be accentuated by the mild ascites.  Multiple renal cysts without obstructive change.  Diverticulosis without definitive diverticulitis.  The appendix is not well seen although no significant inflammatory changes are noted.   Electronically Signed   By: Inez Catalina M.D.   On: 10/08/2014 22:50    Assessment & Plan:   Tiki was seen today for hypertension and anemia.  Diagnoses and all orders for this visit:  Type II diabetes mellitus with complication - her blood sugars are well controlled Orders: -     Hemoglobin A1c; Future  Renal insufficiency Orders: -     Comprehensive metabolic panel; Future  Essential hypertension - her BP is well  controlled, lytes and renal function are stable Orders: -     carvedilol (COREG) 12.5 MG tablet; Take 1 tablet (12.5 mg total) by mouth 2 (two) times daily with a meal. -     Comprehensive metabolic panel; Future -     TSH; Future -     Urinalysis, Routine w reflex microscopic (not at [facility]); Future  Pernicious anemia - the anemia is better but her iron level is low, will start an iron supplement Orders: -     CBC with Differential/Platelet; Future -     IBC panel; Future -     Vitamin B12; Future -     Folate; Future -     Ferritin; Future -     ferrous sulfate (FEOSOL) 325 (65 FE) MG tablet; Take 1 tablet (325 mg total) by mouth 2 (two) times daily with a meal.  Enteritis due to Clostridium difficile - her WBC ct remains elevated, will recheck her stool for C diff Orders: -     Clostridium difficile EIA; Future   I have discontinued Ms. Gilkison's Multiple Vitamins-Minerals (CENTRUM SILVER ADULT 50+ PO), alendronate, and vancomycin. I have also changed her carvedilol. Additionally, I am having her start on ferrous sulfate. Lastly, I am having her maintain her acetaminophen, feeding supplement (ENSURE), saccharomyces boulardii, fluticasone, simvastatin, Vitamin D3, furosemide, and CERTAVITE SENIOR/ANTIOXIDANT.  Meds ordered this encounter  Medications  . Multiple Vitamins-Minerals (CERTAVITE SENIOR/ANTIOXIDANT) TABS    Sig: Take by mouth.  . carvedilol (COREG) 12.5 MG tablet    Sig: Take 1 tablet (12.5 mg total) by mouth 2 (two) times daily with a meal.    Dispense:  180 tablet    Refill:  2  . ferrous sulfate (FEOSOL) 325 (65 FE) MG tablet    Sig: Take 1 tablet (325 mg total) by mouth 2 (two) times daily with a meal.    Dispense:  180 tablet    Refill:  3     Follow-up: Return in about 3 months (around 04/16/2015).  Scarlette Calico, MD

## 2015-01-15 DIAGNOSIS — J45909 Unspecified asthma, uncomplicated: Secondary | ICD-10-CM | POA: Diagnosis not present

## 2015-01-15 DIAGNOSIS — E119 Type 2 diabetes mellitus without complications: Secondary | ICD-10-CM | POA: Diagnosis not present

## 2015-01-15 DIAGNOSIS — N179 Acute kidney failure, unspecified: Secondary | ICD-10-CM | POA: Diagnosis not present

## 2015-01-15 DIAGNOSIS — R197 Diarrhea, unspecified: Secondary | ICD-10-CM | POA: Diagnosis not present

## 2015-01-15 DIAGNOSIS — F0390 Unspecified dementia without behavioral disturbance: Secondary | ICD-10-CM | POA: Diagnosis not present

## 2015-01-15 DIAGNOSIS — I10 Essential (primary) hypertension: Secondary | ICD-10-CM | POA: Diagnosis not present

## 2015-01-18 DIAGNOSIS — N179 Acute kidney failure, unspecified: Secondary | ICD-10-CM | POA: Diagnosis not present

## 2015-01-18 DIAGNOSIS — F0390 Unspecified dementia without behavioral disturbance: Secondary | ICD-10-CM | POA: Diagnosis not present

## 2015-01-18 DIAGNOSIS — E119 Type 2 diabetes mellitus without complications: Secondary | ICD-10-CM | POA: Diagnosis not present

## 2015-01-18 DIAGNOSIS — I10 Essential (primary) hypertension: Secondary | ICD-10-CM | POA: Diagnosis not present

## 2015-01-18 DIAGNOSIS — J45909 Unspecified asthma, uncomplicated: Secondary | ICD-10-CM | POA: Diagnosis not present

## 2015-01-18 DIAGNOSIS — R197 Diarrhea, unspecified: Secondary | ICD-10-CM | POA: Diagnosis not present

## 2015-01-19 DIAGNOSIS — F0390 Unspecified dementia without behavioral disturbance: Secondary | ICD-10-CM | POA: Diagnosis not present

## 2015-01-19 DIAGNOSIS — I10 Essential (primary) hypertension: Secondary | ICD-10-CM | POA: Diagnosis not present

## 2015-01-19 DIAGNOSIS — R197 Diarrhea, unspecified: Secondary | ICD-10-CM | POA: Diagnosis not present

## 2015-01-19 DIAGNOSIS — E119 Type 2 diabetes mellitus without complications: Secondary | ICD-10-CM | POA: Diagnosis not present

## 2015-01-19 DIAGNOSIS — N179 Acute kidney failure, unspecified: Secondary | ICD-10-CM | POA: Diagnosis not present

## 2015-01-19 DIAGNOSIS — J45909 Unspecified asthma, uncomplicated: Secondary | ICD-10-CM | POA: Diagnosis not present

## 2015-01-20 ENCOUNTER — Other Ambulatory Visit: Payer: Medicare Other

## 2015-01-20 ENCOUNTER — Other Ambulatory Visit: Payer: Self-pay

## 2015-01-20 DIAGNOSIS — N289 Disorder of kidney and ureter, unspecified: Secondary | ICD-10-CM

## 2015-01-20 MED ORDER — VITAMIN D3 25 MCG (1000 UT) PO CAPS: 2.0000 | ORAL_CAPSULE | Freq: Every day | ORAL | Status: DC

## 2015-01-21 ENCOUNTER — Other Ambulatory Visit: Payer: Medicare Other

## 2015-01-21 ENCOUNTER — Other Ambulatory Visit: Payer: Self-pay | Admitting: Internal Medicine

## 2015-01-21 DIAGNOSIS — J45909 Unspecified asthma, uncomplicated: Secondary | ICD-10-CM | POA: Diagnosis not present

## 2015-01-21 DIAGNOSIS — N179 Acute kidney failure, unspecified: Secondary | ICD-10-CM | POA: Diagnosis not present

## 2015-01-21 DIAGNOSIS — E119 Type 2 diabetes mellitus without complications: Secondary | ICD-10-CM | POA: Diagnosis not present

## 2015-01-21 DIAGNOSIS — A047 Enterocolitis due to Clostridium difficile: Secondary | ICD-10-CM | POA: Diagnosis not present

## 2015-01-21 DIAGNOSIS — A0472 Enterocolitis due to Clostridium difficile, not specified as recurrent: Secondary | ICD-10-CM

## 2015-01-21 DIAGNOSIS — R197 Diarrhea, unspecified: Secondary | ICD-10-CM | POA: Diagnosis not present

## 2015-01-21 DIAGNOSIS — A498 Other bacterial infections of unspecified site: Secondary | ICD-10-CM

## 2015-01-21 DIAGNOSIS — F0390 Unspecified dementia without behavioral disturbance: Secondary | ICD-10-CM | POA: Diagnosis not present

## 2015-01-21 DIAGNOSIS — I10 Essential (primary) hypertension: Secondary | ICD-10-CM | POA: Diagnosis not present

## 2015-01-21 HISTORY — DX: Other bacterial infections of unspecified site: A49.8

## 2015-01-22 DIAGNOSIS — I10 Essential (primary) hypertension: Secondary | ICD-10-CM | POA: Diagnosis not present

## 2015-01-22 DIAGNOSIS — J45909 Unspecified asthma, uncomplicated: Secondary | ICD-10-CM | POA: Diagnosis not present

## 2015-01-22 DIAGNOSIS — F0390 Unspecified dementia without behavioral disturbance: Secondary | ICD-10-CM | POA: Diagnosis not present

## 2015-01-22 DIAGNOSIS — E119 Type 2 diabetes mellitus without complications: Secondary | ICD-10-CM | POA: Diagnosis not present

## 2015-01-22 DIAGNOSIS — R197 Diarrhea, unspecified: Secondary | ICD-10-CM | POA: Diagnosis not present

## 2015-01-22 DIAGNOSIS — N179 Acute kidney failure, unspecified: Secondary | ICD-10-CM | POA: Diagnosis not present

## 2015-01-22 LAB — C. DIFFICILE GDH AND TOXIN A/B
C. DIFFICILE GDH: DETECTED — AB
C. difficile Toxin A/B: NOT DETECTED

## 2015-01-22 LAB — CLOSTRIDIUM DIFFICILE BY PCR: Toxigenic C. Difficile by PCR: DETECTED — CR

## 2015-01-24 ENCOUNTER — Other Ambulatory Visit: Payer: Self-pay | Admitting: Internal Medicine

## 2015-01-24 DIAGNOSIS — N179 Acute kidney failure, unspecified: Secondary | ICD-10-CM

## 2015-01-24 DIAGNOSIS — A0472 Enterocolitis due to Clostridium difficile, not specified as recurrent: Secondary | ICD-10-CM

## 2015-01-26 ENCOUNTER — Ambulatory Visit (INDEPENDENT_AMBULATORY_CARE_PROVIDER_SITE_OTHER): Payer: Medicare Other | Admitting: Internal Medicine

## 2015-01-26 ENCOUNTER — Encounter: Payer: Self-pay | Admitting: Internal Medicine

## 2015-01-26 VITALS — BP 120/68 | HR 60 | Ht 62.0 in | Wt 119.6 lb

## 2015-01-26 DIAGNOSIS — R195 Other fecal abnormalities: Secondary | ICD-10-CM

## 2015-01-26 DIAGNOSIS — R197 Diarrhea, unspecified: Secondary | ICD-10-CM | POA: Diagnosis not present

## 2015-01-26 DIAGNOSIS — K529 Noninfective gastroenteritis and colitis, unspecified: Secondary | ICD-10-CM | POA: Diagnosis not present

## 2015-01-26 MED ORDER — VANCOMYCIN 50 MG/ML ORAL SOLUTION: 125.0000 mg | ORAL | Status: DC

## 2015-01-26 NOTE — Patient Instructions (Addendum)
We have printed a prescription for you to take to your pharmacy.  You have a follow up appointment with Dr Olevia Perches 03/16/15 at 8:15 am.  Dr Scarlette Calico.

## 2015-01-26 NOTE — Progress Notes (Signed)
Audrey Reed January 15, 1918 [id number]  Note: This dictation was prepared with Dragon digital system. Any transcriptional errors that result from this procedure are unintentional.   History of Present Illness: This is a 81 year old white female with recurrent C. difficile diarrhea.. She was hospitalized from March 3 to 10/09/2014  At [facility] for  diarrhea. C. difficile toxin was positive on 10/08/2014. She was discharged on vancomycin oral solution 125 mg 4 times a day. The diarrhea subsided. Patient rehabilitated at [facility] until mid May 2016. She  returned back to her home about 4 weeks ago and was well until diarrhea recurred last week. Her stool for C. difficile toxin was positive on 01/21/2015. She describes loose stools ,sometimes incontinent. She wears diapers. Crampy abdominal pain. She denies rectal bleeding or fever. Patient had a full colonoscopy in November 2004 which showed extensive diverticulosis of the left and right colon ,she also had a polyp, polyp was not retrieved.   White blood cell count was 13,700. Iron saturation 7.3%. She has started iron supplements resulting in dark colored stools. Her hemoglobin is normal at 15.1. She has mild renal insufficiency. Creatinine 1.36. She comes today with her son. He describes his mother as independent., able to take care of herself. She  cooks and eats well. She lost several pounds in the last several months.    Past Medical History  Diagnosis Date  . HTN (hypertension)   . CAD (coronary artery disease)   . Venous insufficiency   . Hypercholesterolemia   . DM (diabetes mellitus)   . Diverticulosis of colon   . Colon polyp 06/26/03    9 mm sessile polyp 110 cm from anus; not retrieved  . Adenocarcinoma of breast   . DJD (degenerative joint disease)   . Osteoporosis   . Anxiety disorder   . Dementia   . Dermatophytosis of nail 03/31/2013    Past Surgical History  Procedure Laterality Date  . Cataract extraction    .  Mastectomy      for breast cancer    Allergies  Allergen Reactions  . Metformin And Related     diarrhea  . Nitrofurantoin     REACTION: unsure of reaction--happened long ago  . Tramadol Nausea Only  . Codeine Nausea And Vomiting, Swelling and Rash    Family history and social history have been reviewed.  Review of Systems: Crampy abdominal pain. Loose stools. Urgency incontinence  The remainder of the 10 point ROS is negative except as outlined in the H&P  Physical Exam: General Appearance Well developed, in no distress, mildly confused. Walks with a walker Eyes  Non icteric  HEENT  Non traumatic, normocephalic  Mouth No lesion, tongue papillated, no cheilosis Neck Supple without adenopathy, thyroid not enlarged, no carotid bruits, no JVD Lungs Clear to auscultation bilaterally, mild kyphosis COR Normal S1, normal S2, regular rhythm, no murmur, quiet precordium Abdomen soft, nontender, normoactive bowel sounds. No distention Rectal decreased rectal sphincter tone. Dark trace Hemoccult-positive stool., Perirectal area is red and irritated but not bleeding Extremities  No pedal edema Skin multiple ecchymoses on the lower extremities with increased pigmentation. Neurological Alert and oriented x 3 Psychological Normal mood and affect, gets confused easily  Assessment and Plan:   81 year old white female who has recurrent C. difficile diarrhea. She was treated previously with vancomycin and did well .Marland Kitchen She is now having recurrent C. difficile positive diarrhea.. We will to treatment with vancomycin oral solution 50 minute milligrams per 1 cc. She  will be taking 125 mg 4 times a day for 14 days with subsequent taper to 125 mg 3 times a day for week, 2 times a day for week, one times a day for week. I would like to see her in 4 weeks. She will continue on probiotic. We'll repeat C. difficile toxin before discontinuing vancomycin.  Heme positive stool. Iron deficiency anemia. At her  age she is not a candidate for endoscopic procedure. I would consider CT scan of the abdomen to look for any abdominal mass if she remains heme positive my next exam for weeks from now or if she is unable to colorectal the iron deficiency with oral iron   Delfin Edis 01/26/2015

## 2015-01-27 ENCOUNTER — Ambulatory Visit: Payer: Medicare Other | Admitting: Internal Medicine

## 2015-01-27 DIAGNOSIS — Z0289 Encounter for other administrative examinations: Secondary | ICD-10-CM

## 2015-01-28 ENCOUNTER — Telehealth: Payer: Self-pay | Admitting: Internal Medicine

## 2015-01-28 NOTE — Telephone Encounter (Signed)
Patient no showed for follow up on labs.

## 2015-03-13 IMAGING — CT CT HEAD W/O CM
1 of 2 series · 13 of 30 positions shown, 17 images · non-contrast
Comparison: MRI brain 08/05/2012.  Head CT 08/04/2012.

CLINICAL DATA: Periorbital swelling status post fall today.

CT HEAD WITHOUT CONTRAST
TECHNIQUE: Contiguous axial images were obtained from the base of
the skull through the vertex without contrast.

[Series 2: brain · axial · 0.47mm/px · z∈[+71,+193]mm · 13 of 28 slices shown, 17 images]
[im 2/28  brain]
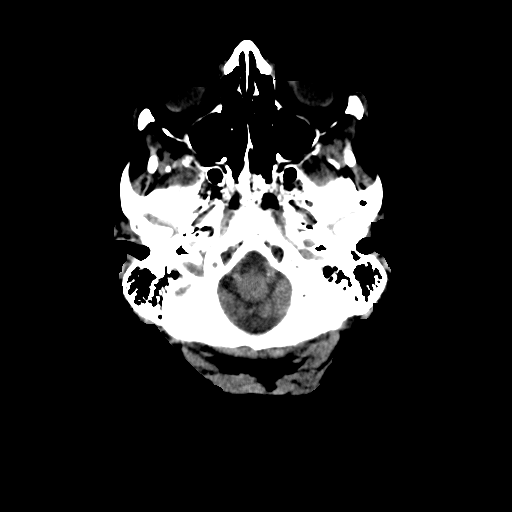
[im 2/28  bone]
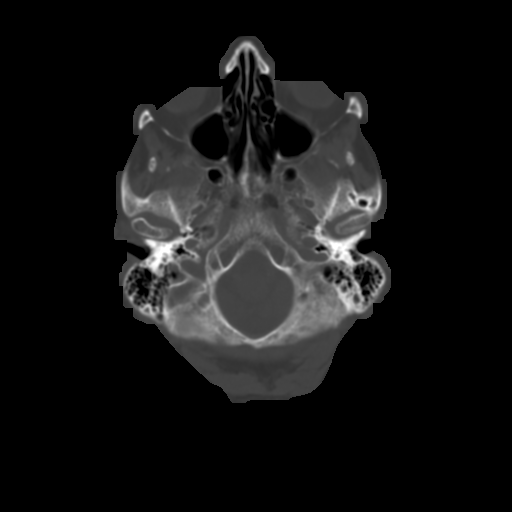
[im 4/28  brain]
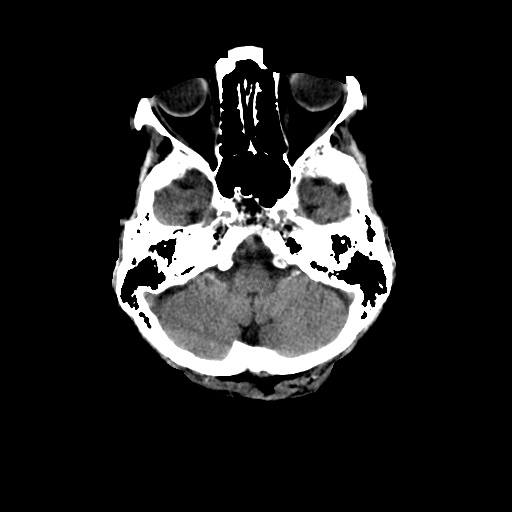
[im 6/28  brain]
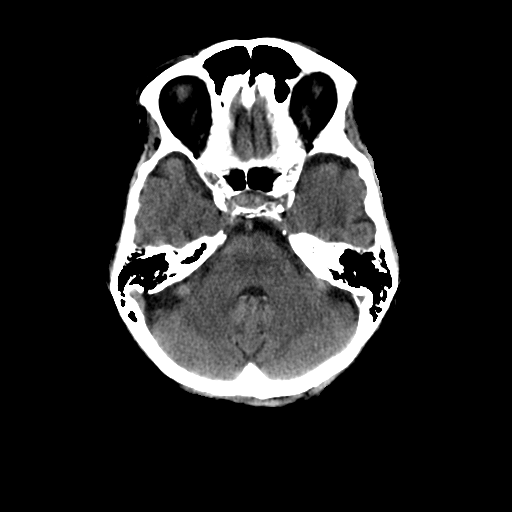
[im 8/28  brain]
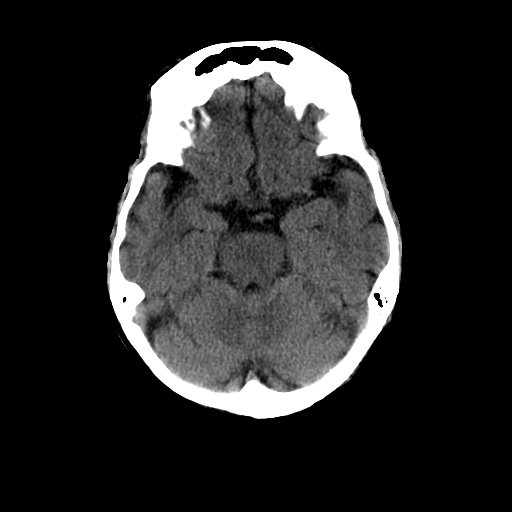
[im 10/28  brain]
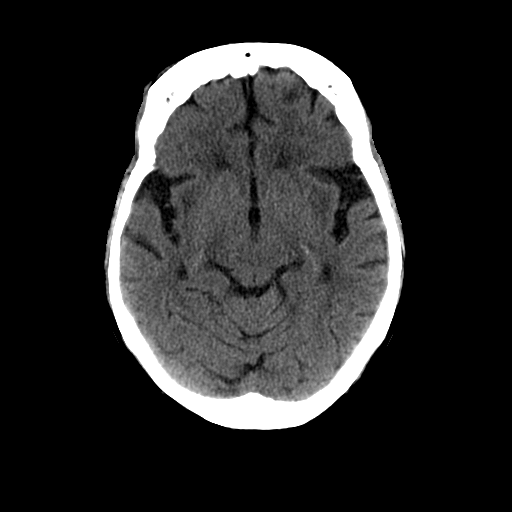
[im 10/28  bone]
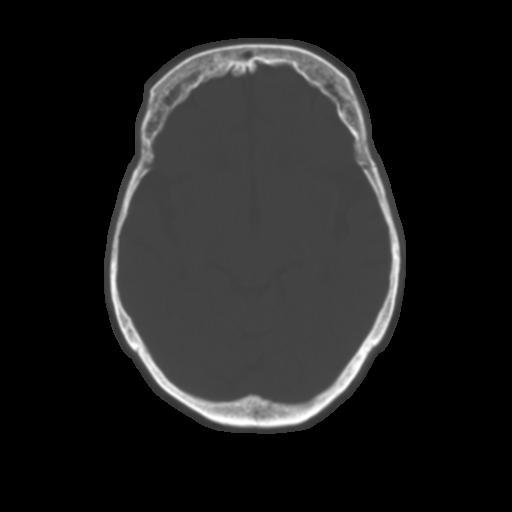
[im 12/28  brain]
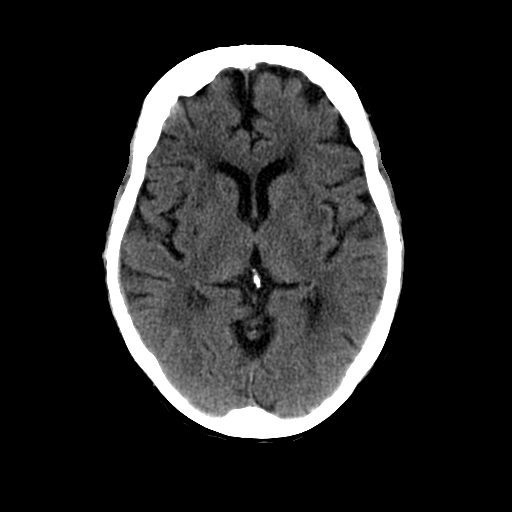
[im 14/28  brain]
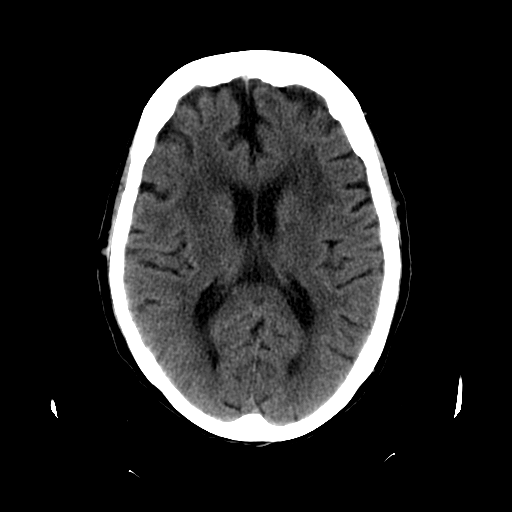
[im 16/28  brain]
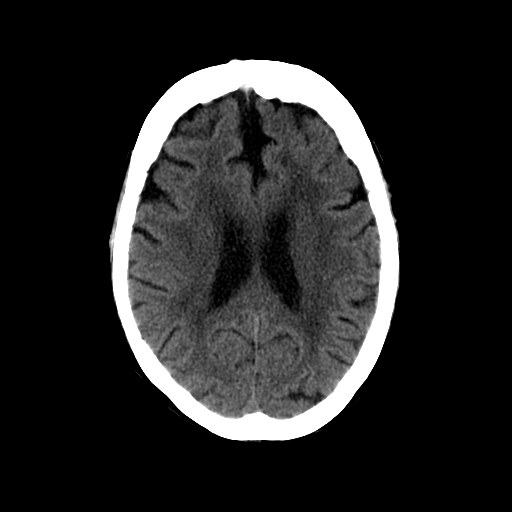
[im 18/28  brain]
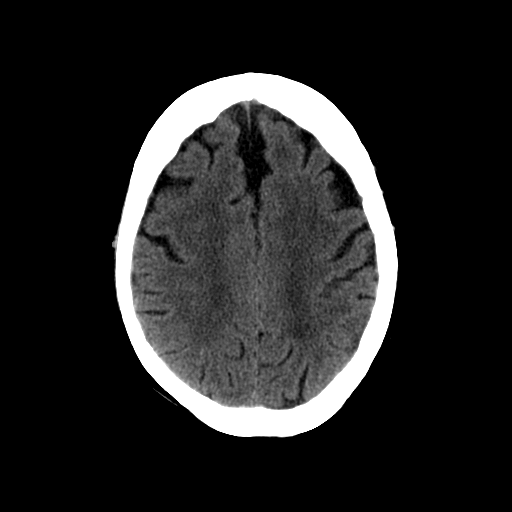
[im 18/28  bone]
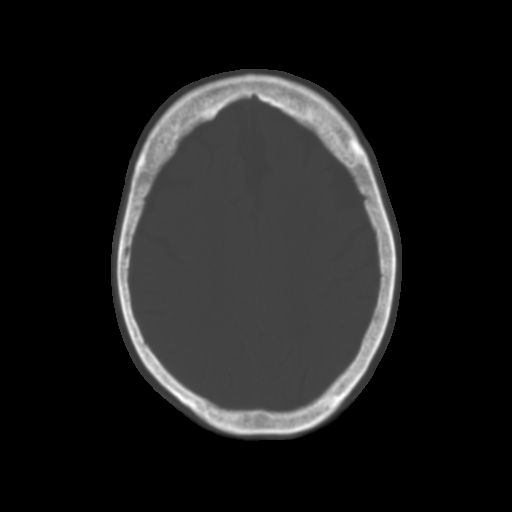
[im 20/28  brain]
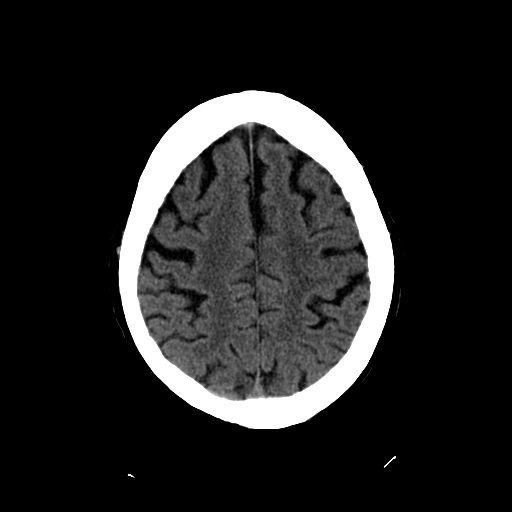
[im 22/28  brain]
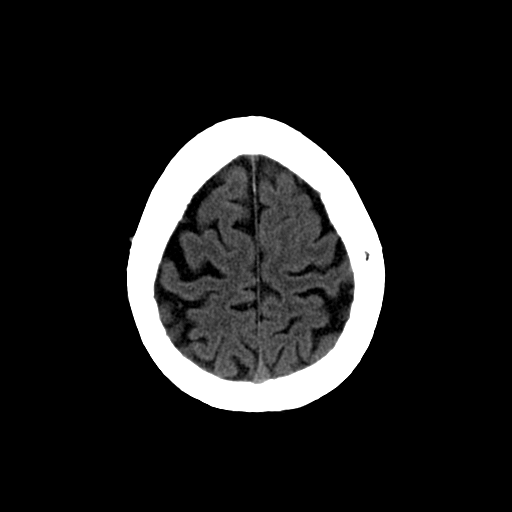
[im 24/28  brain]
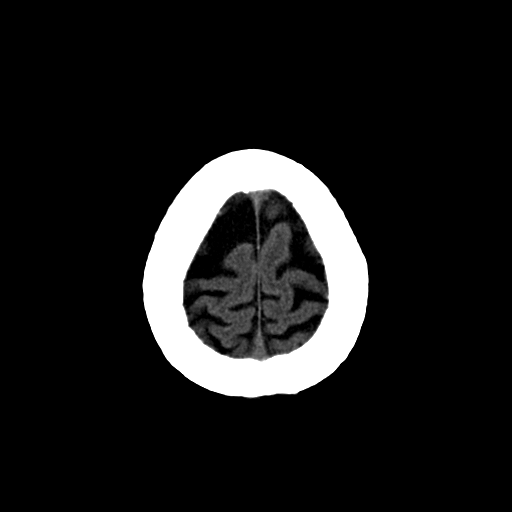
[im 26/28  brain]
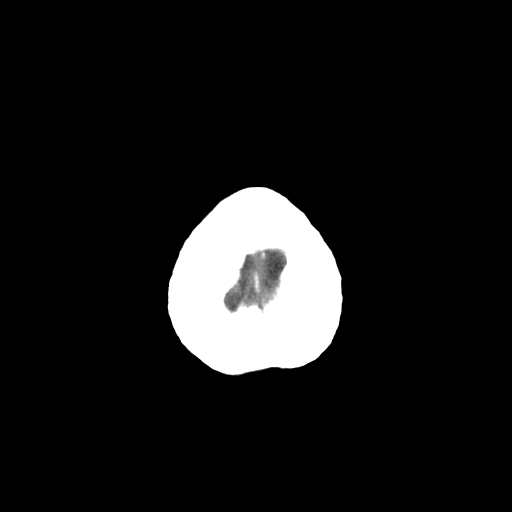
[im 26/28  bone]
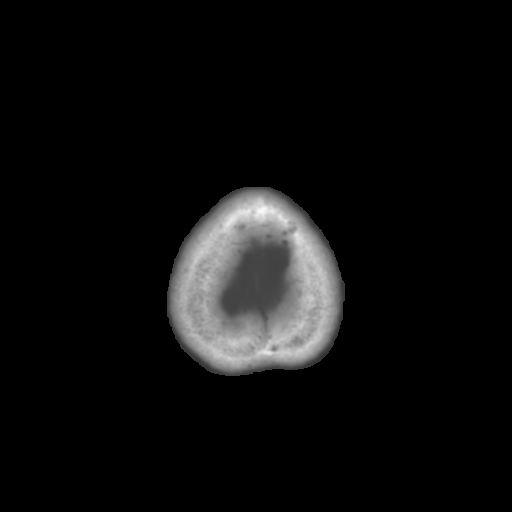

[13 of 30 positions shown; findings below may reference images not displayed]

FINDINGS: There is no evidence of acute intracranial hemorrhage,
mass effect, brain edema or extra-axial fluid collection.  The
ventricles and subarachnoid spaces are diffusely prominent
consistent with moderate atrophy.  No acute cortical infarction is
seen.  Moderate periventricular white matter disease appears
stable.  There is no hydrocephalus.

The visualized paranasal sinuses are clear. The calvarium is intact
with diffuse demineralization. No focal soft tissue swelling is
evident.
IMPRESSION: Stable examination.  No acute or reversible findings identified.

## 2015-03-16 ENCOUNTER — Encounter: Payer: Self-pay | Admitting: Internal Medicine

## 2015-03-16 ENCOUNTER — Ambulatory Visit (INDEPENDENT_AMBULATORY_CARE_PROVIDER_SITE_OTHER): Payer: Medicare Other | Admitting: Internal Medicine

## 2015-03-16 VITALS — BP 160/70 | HR 60 | Ht 61.0 in | Wt 122.1 lb

## 2015-03-16 DIAGNOSIS — R197 Diarrhea, unspecified: Secondary | ICD-10-CM | POA: Diagnosis not present

## 2015-03-16 DIAGNOSIS — K529 Noninfective gastroenteritis and colitis, unspecified: Secondary | ICD-10-CM

## 2015-03-16 NOTE — Progress Notes (Signed)
Kahli Mayon Breth [birth year] [id number]  Note: This dictation was prepared with Dragon digital system. Any transcriptional errors that result from this procedure are unintentional.   History of Present Illness: This is a 81 year old white female presents for follow-up of pseudomembranous colitis. Last appointment in June 2016. She had recurrence of C difficile positive stool  On 12/21/2014 and  underwent repeat treatment with vancomycin. The first course of vancomycin started in not 10/08/2014 The diarrhea stopped  After the first course. But recurred in May.. She underwent  tapered course of vancomycin starting at 125 mg 4 times a day for 2 weeks 3x/day,2x/day. and then gradually decreased to 125 mg daily for 1 week. The diarrhea has subsided. She has been off vancomycin for several weeks. Last colonoscopy in 2004 showed left and right colon diverticulosis. She comes today with her son. Who confirms that the his mother is doing well    Past Medical History  Diagnosis Date  . HTN (hypertension)   . CAD (coronary artery disease)   . Venous insufficiency   . Hypercholesterolemia   . DM (diabetes mellitus)   . Diverticulosis of colon   . Colon polyp 06/26/03    9 mm sessile polyp 110 cm from anus; not retrieved  . Adenocarcinoma of breast   . DJD (degenerative joint disease)   . Osteoporosis   . Anxiety disorder   . Dementia   . Dermatophytosis of nail 03/31/2013  . Clostridium difficile infection 01/21/2015    Past Surgical History  Procedure Laterality Date  . Cataract extraction    . Mastectomy      for breast cancer    Allergies  Allergen Reactions  . Metformin And Related     diarrhea  . Nitrofurantoin     REACTION: unsure of reaction--happened long ago  . Tramadol Nausea Only  . Codeine Nausea And Vomiting, Swelling and Rash    Family history and social history have been reviewed.  Review of Systems: Denies abdominal pain diarrhea urgency or incontinence  The remainder  of the 10 point ROS is negative except as outlined in the H&P  Physical Exam: General Appearance elderly in no distress, hard of hearing Eyes  Non icteric  HEENT  Non traumatic, normocephalic  Mouth No lesion, tongue papillated, no cheilosis Neck Supple without adenopathy, thyroid not enlarged, no carotid bruits, no JVD Lungs Clear to auscultation bilaterally COR Normal S1, normal S2, regular rhythm, no murmur, quiet precordium Abdomen soft nontender normoactive bowel sounds Rectal not done Extremities  No pedal edema Skin No lesions Neurological Alert and oriented x 3 Psychological Normal mood and affect  Assessment and Plan:   81 year old white female with the recent history of pseudomembranous colitis initially treated with vancomycin. Diarrhea and C.Diff  recurred in May 2016 and she was retreated. X 6weeks She is doing well now on probiotics. She will follow up on when necessary basis    Delfin Edis 03/16/2015

## 2015-03-16 NOTE — Patient Instructions (Addendum)
Glad your feeling better.    I appreciate the opportunity to care for you. Delfin Edis, MD    CC- Dr Scarlette Calico

## 2015-05-03 ENCOUNTER — Other Ambulatory Visit: Payer: Self-pay

## 2015-05-03 DIAGNOSIS — I10 Essential (primary) hypertension: Secondary | ICD-10-CM

## 2015-05-03 MED ORDER — FUROSEMIDE 40 MG PO TABS: ORAL_TABLET | ORAL | Status: DC

## 2015-05-03 NOTE — Telephone Encounter (Signed)
Ok to RF? 

## 2015-06-06 DIAGNOSIS — Z23 Encounter for immunization: Secondary | ICD-10-CM | POA: Diagnosis not present

## 2015-07-14 ENCOUNTER — Other Ambulatory Visit: Payer: Self-pay

## 2015-07-14 DIAGNOSIS — E7800 Pure hypercholesterolemia, unspecified: Secondary | ICD-10-CM

## 2015-07-14 MED ORDER — SIMVASTATIN 20 MG PO TABS: ORAL_TABLET | ORAL | Status: DC

## 2015-07-14 MED ORDER — RA VITAMIN D-3 25 MCG (1000 UT) PO TABS: 2000.0000 [IU] | ORAL_TABLET | Freq: Every day | ORAL | Status: DC

## 2015-10-13 ENCOUNTER — Ambulatory Visit (INDEPENDENT_AMBULATORY_CARE_PROVIDER_SITE_OTHER): Payer: Medicare Other | Admitting: Internal Medicine

## 2015-10-13 ENCOUNTER — Telehealth: Payer: Self-pay | Admitting: Internal Medicine

## 2015-10-13 ENCOUNTER — Other Ambulatory Visit (INDEPENDENT_AMBULATORY_CARE_PROVIDER_SITE_OTHER): Payer: Medicare Other

## 2015-10-13 ENCOUNTER — Encounter: Payer: Self-pay | Admitting: Internal Medicine

## 2015-10-13 VITALS — BP 144/82 | HR 56 | Temp 97.7°F | Resp 16 | Ht 61.0 in | Wt 131.0 lb

## 2015-10-13 DIAGNOSIS — N289 Disorder of kidney and ureter, unspecified: Secondary | ICD-10-CM

## 2015-10-13 DIAGNOSIS — E118 Type 2 diabetes mellitus with unspecified complications: Secondary | ICD-10-CM

## 2015-10-13 DIAGNOSIS — I2510 Atherosclerotic heart disease of native coronary artery without angina pectoris: Secondary | ICD-10-CM

## 2015-10-13 DIAGNOSIS — L97911 Non-pressure chronic ulcer of unspecified part of right lower leg limited to breakdown of skin: Secondary | ICD-10-CM

## 2015-10-13 DIAGNOSIS — Z0000 Encounter for general adult medical examination without abnormal findings: Secondary | ICD-10-CM | POA: Diagnosis not present

## 2015-10-13 DIAGNOSIS — I10 Essential (primary) hypertension: Secondary | ICD-10-CM | POA: Diagnosis not present

## 2015-10-13 DIAGNOSIS — D510 Vitamin B12 deficiency anemia due to intrinsic factor deficiency: Secondary | ICD-10-CM | POA: Diagnosis not present

## 2015-10-13 DIAGNOSIS — N3000 Acute cystitis without hematuria: Secondary | ICD-10-CM | POA: Insufficient documentation

## 2015-10-13 DIAGNOSIS — Z23 Encounter for immunization: Secondary | ICD-10-CM | POA: Diagnosis not present

## 2015-10-13 DIAGNOSIS — L97909 Non-pressure chronic ulcer of unspecified part of unspecified lower leg with unspecified severity: Secondary | ICD-10-CM | POA: Insufficient documentation

## 2015-10-13 DIAGNOSIS — F0390 Unspecified dementia without behavioral disturbance: Secondary | ICD-10-CM

## 2015-10-13 LAB — URINALYSIS, ROUTINE W REFLEX MICROSCOPIC
BILIRUBIN URINE: NEGATIVE
KETONES UR: NEGATIVE
NITRITE: POSITIVE — AB
Specific Gravity, Urine: 1.02 (ref 1.000–1.030)
Urine Glucose: NEGATIVE
Urobilinogen, UA: 0.2 (ref 0.0–1.0)
pH: 5.5 (ref 5.0–8.0)

## 2015-10-13 LAB — CBC WITH DIFFERENTIAL/PLATELET
BASOS PCT: 0.5 % (ref 0.0–3.0)
Basophils Absolute: 0.1 10*3/uL (ref 0.0–0.1)
EOS ABS: 0.4 10*3/uL (ref 0.0–0.7)
EOS PCT: 3.5 % (ref 0.0–5.0)
HEMATOCRIT: 42.2 % (ref 36.0–46.0)
HEMOGLOBIN: 14.3 g/dL (ref 12.0–15.0)
LYMPHS ABS: 2.7 10*3/uL (ref 0.7–4.0)
Lymphocytes Relative: 21.7 % (ref 12.0–46.0)
MCHC: 33.8 g/dL (ref 30.0–36.0)
MCV: 88.3 fl (ref 78.0–100.0)
Monocytes Absolute: 1 10*3/uL (ref 0.1–1.0)
Monocytes Relative: 8.2 % (ref 3.0–12.0)
NEUTROS ABS: 8.1 10*3/uL — AB (ref 1.4–7.7)
Neutrophils Relative %: 66.1 % (ref 43.0–77.0)
Platelets: 199 10*3/uL (ref 150.0–400.0)
RBC: 4.78 Mil/uL (ref 3.87–5.11)
RDW: 14.7 % (ref 11.5–15.5)
WBC: 12.3 10*3/uL — ABNORMAL HIGH (ref 4.0–10.5)

## 2015-10-13 LAB — HEMOGLOBIN A1C: Hgb A1c MFr Bld: 6 % (ref 4.6–6.5)

## 2015-10-13 LAB — LIPID PANEL
Cholesterol: 168 mg/dL (ref 0–200)
HDL: 72.9 mg/dL (ref >39.00)
LDL CALC: 78 mg/dL (ref 0–99)
NONHDL: 95.58
Total CHOL/HDL Ratio: 2
Triglycerides: 89 mg/dL (ref 0.0–149.0)
VLDL: 17.8 mg/dL (ref 0.0–40.0)

## 2015-10-13 LAB — COMPREHENSIVE METABOLIC PANEL
ALT: 8 U/L (ref 0–35)
AST: 15 U/L (ref 0–37)
Albumin: 4.2 g/dL (ref 3.5–5.2)
Alkaline Phosphatase: 41 U/L (ref 39–117)
BUN: 34 mg/dL — AB (ref 6–23)
CHLORIDE: 103 meq/L (ref 96–112)
CO2: 30 mEq/L (ref 19–32)
Calcium: 10.1 mg/dL (ref 8.4–10.5)
Creatinine, Ser: 1.21 mg/dL — ABNORMAL HIGH (ref 0.40–1.20)
GFR: 43.87 mL/min — ABNORMAL LOW (ref >60.00)
GLUCOSE: 138 mg/dL — AB (ref 70–99)
POTASSIUM: 4.3 meq/L (ref 3.5–5.1)
SODIUM: 141 meq/L (ref 135–145)
TOTAL PROTEIN: 7 g/dL (ref 6.0–8.3)
Total Bilirubin: 0.7 mg/dL (ref 0.2–1.2)

## 2015-10-13 LAB — TSH: TSH: 2.31 u[IU]/mL (ref 0.35–4.50)

## 2015-10-13 MED ORDER — SULFAMETHOXAZOLE-TRIMETHOPRIM 800-160 MG PO TABS: 1.0000 | ORAL_TABLET | Freq: Two times a day (BID) | ORAL | Status: AC

## 2015-10-13 MED ORDER — VITAMIN D3 25 MCG (1000 UT) PO CAPS: 2.0000 | ORAL_CAPSULE | Freq: Every day | ORAL | Status: AC

## 2015-10-13 MED ORDER — CARVEDILOL 12.5 MG PO TABS: 12.5000 mg | ORAL_TABLET | Freq: Two times a day (BID) | ORAL | Status: DC

## 2015-10-13 MED ORDER — FERROUS SULFATE 325 (65 FE) MG PO TABS: 325.0000 mg | ORAL_TABLET | Freq: Two times a day (BID) | ORAL | Status: DC

## 2015-10-13 MED ORDER — FUROSEMIDE 40 MG PO TABS: ORAL_TABLET | ORAL | Status: DC

## 2015-10-13 NOTE — Progress Notes (Signed)
Pre visit review using our clinic review tool, if applicable. No additional management support is needed unless otherwise documented below in the visit note. 

## 2015-10-13 NOTE — Progress Notes (Signed)
Subjective:  Patient ID: Audrey Reed, female    DOB: [birth year]  Age: 82 y.o. MRN: [id number]  CC: Annual Exam; Hypertension; Hyperlipidemia; and Diabetes   HPI Audrey Reed presents for a CPX/AWV.  She complains of dysuria and urinary frequency over the past 2 weeks. She denies hematuria, abdominal pain, nausea, vomiting, fever, or chills. She tells me that her blood pressures been well controlled. She complains of scabs and sores on her right lower extremity. She accidentally bumps her right lower leg and developed skin tears. Her last skin tear developed about 3 days ago and is on the lateral aspect of the right upper calf. She is doing well on her cholesterol medicine with no myalgias or arthralgias.  Outpatient Prescriptions Prior to Visit  Medication Sig Dispense Refill  . acetaminophen (TYLENOL) 325 MG tablet Take 2 tablets (650 mg total) by mouth every 6 (six) hours as needed (or Fever >/= 101).    . feeding supplement (ENSURE) PUDG Take 1 Container by mouth 3 (three) times daily between meals.    . fluticasone (FLONASE) 50 MCG/ACT nasal spray Place 2 sprays into both nostrils daily. 16 g 6  . Multiple Vitamins-Minerals (CERTAVITE SENIOR/ANTIOXIDANT) TABS Take by mouth.    . saccharomyces boulardii (FLORASTOR) 250 MG capsule Take 1 capsule (250 mg total) by mouth daily. 30 capsule 11  . simvastatin (ZOCOR) 20 MG tablet take 1 tablet by mouth at bedtime 90 tablet 3  . carvedilol (COREG) 12.5 MG tablet Take 1 tablet (12.5 mg total) by mouth 2 (two) times daily with a meal. 180 tablet 2  . Cholecalciferol (VITAMIN D3) 1000 UNITS CAPS Take 2 capsules (2,000 Units total) by mouth daily. 90 capsule 3  . ferrous sulfate (FEOSOL) 325 (65 FE) MG tablet Take 1 tablet (325 mg total) by mouth 2 (two) times daily with a meal. 180 tablet 3  . furosemide (LASIX) 40 MG tablet take 1 tablet by mouth every morning 90 tablet 1  . RA VITAMIN D-3 1000 UNITS tablet Take 2 tablets (2,000 Units total)  by mouth daily. 90 tablet 0   No facility-administered medications prior to visit.    ROS Review of Systems  Constitutional: Negative.  Negative for fever, chills, diaphoresis, appetite change and fatigue.  HENT: Negative.   Eyes: Negative.   Respiratory: Negative.  Negative for cough, choking, chest tightness, shortness of breath and stridor.   Cardiovascular: Negative.  Negative for chest pain, palpitations and leg swelling.  Gastrointestinal: Negative.  Negative for nausea, abdominal pain, diarrhea, constipation and blood in stool.  Endocrine: Negative.   Genitourinary: Positive for dysuria and frequency. Negative for urgency, hematuria and decreased urine volume.  Musculoskeletal: Negative.  Negative for myalgias, back pain, arthralgias and neck pain.  Skin: Positive for wound. Negative for color change, pallor and rash.  Allergic/Immunologic: Negative.   Neurological: Negative.  Negative for dizziness, tremors, syncope, light-headedness, numbness and headaches.  Hematological: Negative.  Negative for adenopathy. Does not bruise/bleed easily.  Psychiatric/Behavioral: Negative.     Objective:  BP 144/82 mmHg  Pulse 56  Temp(Src) 97.7 F (36.5 C) (Oral)  Resp 16  Ht 5\' 1"  (1.549 m)  Wt 131 lb (59.421 kg)  BMI 24.76 kg/m2  SpO2 96%  BP Readings from Last 3 Encounters:  10/13/15 144/82  03/16/15 160/70  01/26/15 120/68    Wt Readings from Last 3 Encounters:  10/13/15 131 lb (59.421 kg)  03/16/15 122 lb 2 oz (55.396 kg)  01/26/15 119 lb 9.6  oz (54.25 kg)    Physical Exam  Constitutional: She is oriented to person, place, and time. She appears well-developed and well-nourished.  Non-toxic appearance. She does not have a sickly appearance. She does not appear ill. No distress.  HENT:  Head: Normocephalic and atraumatic.  Mouth/Throat: Oropharynx is clear and moist. No oropharyngeal exudate.  Eyes: Conjunctivae are normal. Right eye exhibits no discharge. Left eye  exhibits no discharge. No scleral icterus.  Neck: Normal range of motion. Neck supple. No JVD present. No tracheal deviation present. No thyromegaly present.  Cardiovascular: Normal rate, regular rhythm, S1 normal, S2 normal and intact distal pulses.  Exam reveals no gallop and no friction rub.   Murmur heard.  Systolic murmur is present with a grade of 2/6   No diastolic murmur is present  Pulses:      Carotid pulses are 1+ on the right side, and 1+ on the left side.      Radial pulses are 1+ on the right side, and 1+ on the left side.       Femoral pulses are 1+ on the right side, and 1+ on the left side.      Popliteal pulses are 1+ on the right side, and 1+ on the left side.       Dorsalis pedis pulses are 1+ on the right side, and 1+ on the left side.       Posterior tibial pulses are 1+ on the right side, and 1+ on the left side.  Pulmonary/Chest: Effort normal and breath sounds normal. No stridor. No respiratory distress. She has no wheezes. She has no rales. She exhibits no tenderness.  Abdominal: Soft. Bowel sounds are normal. She exhibits no distension and no mass. There is no tenderness. There is no rebound and no guarding.  Musculoskeletal: Normal range of motion. She exhibits no edema or tenderness.       Legs: Lymphadenopathy:    She has no cervical adenopathy.  Neurological: She is oriented to person, place, and time.  Skin: Skin is warm and dry. No rash noted. She is not diaphoretic. No erythema. No pallor.  Psychiatric: She has a normal mood and affect. Her behavior is normal. Judgment and thought content normal.  Vitals reviewed.   Lab Results  Component Value Date   WBC 12.3* 10/13/2015   HGB 14.3 10/13/2015   HCT 42.2 10/13/2015   PLT 199.0 10/13/2015   GLUCOSE 138* 10/13/2015   CHOL 168 10/13/2015   TRIG 89.0 10/13/2015   HDL 72.90 10/13/2015   LDLCALC 78 10/13/2015   ALT 8 10/13/2015   AST 15 10/13/2015   NA 141 10/13/2015   K 4.3 10/13/2015   CL 103  10/13/2015   CREATININE 1.21* 10/13/2015   BUN 34* 10/13/2015   CO2 30 10/13/2015   TSH 2.31 10/13/2015   INR 1.31 08/04/2012   HGBA1C 6.0 10/13/2015    Ct Abdomen Pelvis Wo Contrast  10/08/2014  CLINICAL DATA:  Right lower quadrant pain for 10-12 days EXAM: CT ABDOMEN AND PELVIS WITHOUT CONTRAST TECHNIQUE: Multidetector CT imaging of the abdomen and pelvis was performed following the standard protocol without IV contrast. COMPARISON:  None. FINDINGS: Lung bases demonstrate bilateral small pleural effusions and mild bibasilar atelectatic changes. The liver, spleen, pancreas and adrenal glands are within normal limits. The gallbladder is well distended with some dependent gallstones. There are changes suggestive of mild gallbladder wall thickening although this may be accentuated by small amount of ascites. The kidneys are well  visualized and demonstrate multiple hypodensities bilaterally likely representing cysts. The largest of these is on the left measuring 5.1 cm in greatest dimension. A small 1 cm hyperdensity is noted likely representing a hemorrhagic cyst. This is best seen on image number 22 of series 201. No calculi or obstructive changes are noted. The appendix is not well visualized although no right lower quadrant inflammatory changes are seen to suggest appendicitis. Small amount of ascites is seen. Diverticulosis of the sigmoid colon is noted with significant spur of muscle hypertrophy. No definitive diverticulitis is seen. The bladder is partially distended. No pelvic mass lesion is seen. No acute bony abnormality is noted. IMPRESSION: Bilateral small pleural effusions with bibasilar atelectasis. Mild ascites. Cholelithiasis with changes suggestive of gallbladder wall thickening although this may be accentuated by the mild ascites. Multiple renal cysts without obstructive change. Diverticulosis without definitive diverticulitis. The appendix is not well seen although no significant  inflammatory changes are noted. Electronically Signed   By: Inez Catalina M.D.   On: 10/08/2014 22:50    Assessment & Plan:   Khiley was seen today for annual exam, hypertension, hyperlipidemia and diabetes.  Diagnoses and all orders for this visit:  Atherosclerosis of native coronary artery of native heart without angina pectoris- she has had no recent episodes of chest pain or shortness of breath, will continue to modify her risk factors with statin therapy and blood pressure control. -     Lipid panel; Future  Essential hypertension- her blood pressure is well-controlled, electrolytes and renal function are stable. -     carvedilol (COREG) 12.5 MG tablet; Take 1 tablet (12.5 mg total) by mouth 2 (two) times daily with a meal. -     furosemide (LASIX) 40 MG tablet; take 1 tablet by mouth every morning -     Comprehensive metabolic panel; Future -     TSH; Future -     Urinalysis, Routine w reflex microscopic (not at [facility]); Future  Type 2 diabetes mellitus with complication, without long-term current use of insulin ([place])- her blood sugars are very well controlled -     Lipid panel; Future -     Comprehensive metabolic panel; Future -     Hemoglobin A1c; Future  Renal insufficiency- her renal function is stable, she will continue to avoid nephrotoxic agents, will maintain good blood pressure control. -     Cholecalciferol (VITAMIN D3) 1000 units CAPS; Take 2 capsules (2,000 Units total) by mouth daily. -     Comprehensive metabolic panel; Future -     CBC with Differential/Platelet; Future  Pernicious anemia- improvement noted -     ferrous sulfate (FEOSOL) 325 (65 FE) MG tablet; Take 1 tablet (325 mg total) by mouth 2 (two) times daily with a meal. -     CBC with Differential/Platelet; Future  Dementia, without behavioral disturbance -     Ambulatory referral to [place]  Routine general medical examination at a health care facility  Leg ulcer, right, limited to breakdown of  skin ([place]) -     Ambulatory referral to [place]  Need for 23-polyvalent pneumococcal polysaccharide vaccine -     Pneumococcal polysaccharide vaccine 23-valent greater than or equal to 2yo subcutaneous/IM  Acute cystitis without hematuria- she has symptoms and an abnormal UA, will treat with Bactrim -     sulfamethoxazole-trimethoprim (BACTRIM DS,SEPTRA DS) 800-160 MG tablet; Take 1 tablet by mouth 2 (two) times daily.  I have discontinued Ms. Siever's RA VITAMIN D-3. I  am also having her start on sulfamethoxazole-trimethoprim. Additionally, I am having her maintain her acetaminophen, feeding supplement (ENSURE), saccharomyces boulardii, fluticasone, CERTAVITE SENIOR/ANTIOXIDANT, simvastatin, carvedilol, Vitamin D3, ferrous sulfate, and furosemide.  Meds ordered this encounter  Medications  . carvedilol (COREG) 12.5 MG tablet    Sig: Take 1 tablet (12.5 mg total) by mouth 2 (two) times daily with a meal.    Dispense:  180 tablet    Refill:  2  . Cholecalciferol (VITAMIN D3) 1000 units CAPS    Sig: Take 2 capsules (2,000 Units total) by mouth daily.    Dispense:  90 capsule    Refill:  3  . ferrous sulfate (FEOSOL) 325 (65 FE) MG tablet    Sig: Take 1 tablet (325 mg total) by mouth 2 (two) times daily with a meal.    Dispense:  180 tablet    Refill:  3  . furosemide (LASIX) 40 MG tablet    Sig: take 1 tablet by mouth every morning    Dispense:  90 tablet    Refill:  1  . sulfamethoxazole-trimethoprim (BACTRIM DS,SEPTRA DS) 800-160 MG tablet    Sig: Take 1 tablet by mouth 2 (two) times daily.    Dispense:  10 tablet    Refill:  1   See AVS for instructions about healthy living and anticipatory guidance.  Follow-up: Return in about 6 months (around 04/14/2016).  Scarlette Calico, MD

## 2015-10-13 NOTE — Patient Instructions (Signed)
Preventive Care for Adults, Female A healthy lifestyle and preventive care can promote health and wellness. Preventive health guidelines for women include the following key practices.  A routine yearly physical is a good way to check with your health care provider about your health and preventive screening. It is a chance to share any concerns and updates on your health and to receive a thorough exam.  Visit your dentist for a routine exam and preventive care every 6 months. Brush your teeth twice a day and floss once a day. Good oral hygiene prevents tooth decay and gum disease.  The frequency of eye exams is based on your age, health, family medical history, use of contact lenses, and other factors. Follow your health care provider's recommendations for frequency of eye exams.  Eat a healthy diet. Foods like vegetables, fruits, whole grains, low-fat dairy products, and lean protein foods contain the nutrients you need without too many calories. Decrease your intake of foods high in solid fats, added sugars, and salt. Eat the right amount of calories for you.Get information about a proper diet from your health care provider, if necessary.  Regular physical exercise is one of the most important things you can do for your health. Most adults should get at least 150 minutes of moderate-intensity exercise (any activity that increases your heart rate and causes you to sweat) each week. In addition, most adults need muscle-strengthening exercises on 2 or more days a week.  Maintain a healthy weight. The body mass index (BMI) is a screening tool to identify possible weight problems. It provides an estimate of body fat based on height and weight. Your health care provider can find your BMI and can help you achieve or maintain a healthy weight.For adults 20 years and older:  A BMI below 18.5 is considered underweight.  A BMI of 18.5 to 24.9 is normal.  A BMI of 25 to 29.9 is considered overweight.  A  BMI of 30 and above is considered obese.  Maintain normal blood lipids and cholesterol levels by exercising and minimizing your intake of saturated fat. Eat a balanced diet with plenty of fruit and vegetables. Blood tests for lipids and cholesterol should begin at age 45 and be repeated every 5 years. If your lipid or cholesterol levels are high, you are over 50, or you are at high risk for heart disease, you may need your cholesterol levels checked more frequently.Ongoing high lipid and cholesterol levels should be treated with medicines if diet and exercise are not working.  If you smoke, find out from your health care provider how to quit. If you do not use tobacco, do not start.  Lung cancer screening is recommended for adults aged 45-80 years who are at high risk for developing lung cancer because of a history of smoking. A yearly low-dose CT scan of the lungs is recommended for people who have at least a 30-pack-year history of smoking and are a current smoker or have quit within the past 15 years. A pack year of smoking is smoking an average of 1 pack of cigarettes a day for 1 year (for example: 1 pack a day for 30 years or 2 packs a day for 15 years). Yearly screening should continue until the smoker has stopped smoking for at least 15 years. Yearly screening should be stopped for people who develop a health problem that would prevent them from having lung cancer treatment.  If you are pregnant, do not drink alcohol. If you are  breastfeeding, be very cautious about drinking alcohol. If you are not pregnant and choose to drink alcohol, do not have more than 1 drink per day. One drink is considered to be 12 ounces (355 mL) of beer, 5 ounces (148 mL) of wine, or 1.5 ounces (44 mL) of liquor.  Avoid use of street drugs. Do not share needles with anyone. Ask for help if you need support or instructions about stopping the use of drugs.  High blood pressure causes heart disease and increases the risk  of stroke. Your blood pressure should be checked at least every 1 to 2 years. Ongoing high blood pressure should be treated with medicines if weight loss and exercise do not work.  If you are 55-79 years old, ask your health care provider if you should take aspirin to prevent strokes.  Diabetes screening is done by taking a blood sample to check your blood glucose level after you have not eaten for a certain period of time (fasting). If you are not overweight and you do not have risk factors for diabetes, you should be screened once every 3 years starting at age 45. If you are overweight or obese and you are 40-70 years of age, you should be screened for diabetes every year as part of your cardiovascular risk assessment.  Breast cancer screening is essential preventive care for women. You should practice "breast self-awareness." This means understanding the normal appearance and feel of your breasts and may include breast self-examination. Any changes detected, no matter how small, should be reported to a health care provider. Women in their 20s and 30s should have a clinical breast exam (CBE) by a health care provider as part of a regular health exam every 1 to 3 years. After age 40, women should have a CBE every year. Starting at age 40, women should consider having a mammogram (breast X-ray test) every year. Women who have a family history of breast cancer should talk to their health care provider about genetic screening. Women at a high risk of breast cancer should talk to their health care providers about having an MRI and a mammogram every year.  Breast cancer gene (BRCA)-related cancer risk assessment is recommended for women who have family members with BRCA-related cancers. BRCA-related cancers include breast, ovarian, tubal, and peritoneal cancers. Having family members with these cancers may be associated with an increased risk for harmful changes (mutations) in the breast cancer genes BRCA1 and  BRCA2. Results of the assessment will determine the need for genetic counseling and BRCA1 and BRCA2 testing.  Your health care provider may recommend that you be screened regularly for cancer of the pelvic organs (ovaries, uterus, and vagina). This screening involves a pelvic examination, including checking for microscopic changes to the surface of your cervix (Pap test). You may be encouraged to have this screening done every 3 years, beginning at age 21.  For women ages 30-65, health care providers may recommend pelvic exams and Pap testing every 3 years, or they may recommend the Pap and pelvic exam, combined with testing for human papilloma virus (HPV), every 5 years. Some types of HPV increase your risk of cervical cancer. Testing for HPV may also be done on women of any age with unclear Pap test results.  Other health care providers may not recommend any screening for nonpregnant women who are considered low risk for pelvic cancer and who do not have symptoms. Ask your health care provider if a screening pelvic exam is right for   you.  If you have had past treatment for cervical cancer or a condition that could lead to cancer, you need Pap tests and screening for cancer for at least 20 years after your treatment. If Pap tests have been discontinued, your risk factors (such as having a new sexual partner) need to be reassessed to determine if screening should resume. Some women have medical problems that increase the chance of getting cervical cancer. In these cases, your health care provider may recommend more frequent screening and Pap tests.  Colorectal cancer can be detected and often prevented. Most routine colorectal cancer screening begins at the age of 50 years and continues through age 75 years. However, your health care provider may recommend screening at an earlier age if you have risk factors for colon cancer. On a yearly basis, your health care provider may provide home test kits to check  for hidden blood in the stool. Use of a small camera at the end of a tube, to directly examine the colon (sigmoidoscopy or colonoscopy), can detect the earliest forms of colorectal cancer. Talk to your health care provider about this at age 50, when routine screening begins. Direct exam of the colon should be repeated every 5-10 years through age 75 years, unless early forms of precancerous polyps or small growths are found.  People who are at an increased risk for hepatitis B should be screened for this virus. You are considered at high risk for hepatitis B if:  You were born in a country where hepatitis B occurs often. Talk with your health care provider about which countries are considered high risk.  Your parents were born in a high-risk country and you have not received a shot to protect against hepatitis B (hepatitis B vaccine).  You have HIV or AIDS.  You use needles to inject street drugs.  You live with, or have sex with, someone who has hepatitis B.  You get hemodialysis treatment.  You take certain medicines for conditions like cancer, organ transplantation, and autoimmune conditions.  Hepatitis C blood testing is recommended for all people born from 1945 through 1965 and any individual with known risks for hepatitis C.  Practice safe sex. Use condoms and avoid high-risk sexual practices to reduce the spread of sexually transmitted infections (STIs). STIs include gonorrhea, chlamydia, syphilis, trichomonas, herpes, HPV, and human immunodeficiency virus (HIV). Herpes, HIV, and HPV are viral illnesses that have no cure. They can result in disability, cancer, and death.  You should be screened for sexually transmitted illnesses (STIs) including gonorrhea and chlamydia if:  You are sexually active and are younger than 24 years.  You are older than 24 years and your health care provider tells you that you are at risk for this type of infection.  Your sexual activity has changed  since you were last screened and you are at an increased risk for chlamydia or gonorrhea. Ask your health care provider if you are at risk.  If you are at risk of being infected with HIV, it is recommended that you take a prescription medicine daily to prevent HIV infection. This is called preexposure prophylaxis (PrEP). You are considered at risk if:  You are sexually active and do not regularly use condoms or know the HIV status of your partner(s).  You take drugs by injection.  You are sexually active with a partner who has HIV.  Talk with your health care provider about whether you are at high risk of being infected with HIV. If   you choose to begin PrEP, you should first be tested for HIV. You should then be tested every 3 months for as long as you are taking PrEP.  Osteoporosis is a disease in which the bones lose minerals and strength with aging. This can result in serious bone fractures or breaks. The risk of osteoporosis can be identified using a bone density scan. Women ages 67 years and over and women at risk for fractures or osteoporosis should discuss screening with their health care providers. Ask your health care provider whether you should take a calcium supplement or vitamin D to reduce the rate of osteoporosis.  Menopause can be associated with physical symptoms and risks. Hormone replacement therapy is available to decrease symptoms and risks. You should talk to your health care provider about whether hormone replacement therapy is right for you.  Use sunscreen. Apply sunscreen liberally and repeatedly throughout the day. You should seek shade when your shadow is shorter than you. Protect yourself by wearing long sleeves, pants, a wide-brimmed hat, and sunglasses year round, whenever you are outdoors.  Once a month, do a whole body skin exam, using a mirror to look at the skin on your back. Tell your health care provider of new moles, moles that have irregular borders, moles that  are larger than a pencil eraser, or moles that have changed in shape or color.  Stay current with required vaccines (immunizations).  Influenza vaccine. All adults should be immunized every year.  Tetanus, diphtheria, and acellular pertussis (Td, Tdap) vaccine. Pregnant women should receive 1 dose of Tdap vaccine during each pregnancy. The dose should be obtained regardless of the length of time since the last dose. Immunization is preferred during the 27th-36th week of gestation. An adult who has not previously received Tdap or who does not know her vaccine status should receive 1 dose of Tdap. This initial dose should be followed by tetanus and diphtheria toxoids (Td) booster doses every 10 years. Adults with an unknown or incomplete history of completing a 3-dose immunization series with Td-containing vaccines should begin or complete a primary immunization series including a Tdap dose. Adults should receive a Td booster every 10 years.  Varicella vaccine. An adult without evidence of immunity to varicella should receive 2 doses or a second dose if she has previously received 1 dose. Pregnant females who do not have evidence of immunity should receive the first dose after pregnancy. This first dose should be obtained before leaving the health care facility. The second dose should be obtained 4-8 weeks after the first dose.  Human papillomavirus (HPV) vaccine. Females aged 13-26 years who have not received the vaccine previously should obtain the 3-dose series. The vaccine is not recommended for use in pregnant females. However, pregnancy testing is not needed before receiving a dose. If a female is found to be pregnant after receiving a dose, no treatment is needed. In that case, the remaining doses should be delayed until after the pregnancy. Immunization is recommended for any person with an immunocompromised condition through the age of 61 years if she did not get any or all doses earlier. During the  3-dose series, the second dose should be obtained 4-8 weeks after the first dose. The third dose should be obtained 24 weeks after the first dose and 16 weeks after the second dose.  Zoster vaccine. One dose is recommended for adults aged 30 years or older unless certain conditions are present.  Measles, mumps, and rubella (MMR) vaccine. Adults born  before 1957 generally are considered immune to measles and mumps. Adults born in 1957 or later should have 1 or more doses of MMR vaccine unless there is a contraindication to the vaccine or there is laboratory evidence of immunity to each of the three diseases. A routine second dose of MMR vaccine should be obtained at least 28 days after the first dose for students attending postsecondary schools, health care workers, or international travelers. People who received inactivated measles vaccine or an unknown type of measles vaccine during 1963-1967 should receive 2 doses of MMR vaccine. People who received inactivated mumps vaccine or an unknown type of mumps vaccine before 1979 and are at high risk for mumps infection should consider immunization with 2 doses of MMR vaccine. For females of childbearing age, rubella immunity should be determined. If there is no evidence of immunity, females who are not pregnant should be vaccinated. If there is no evidence of immunity, females who are pregnant should delay immunization until after pregnancy. Unvaccinated health care workers born before 1957 who lack laboratory evidence of measles, mumps, or rubella immunity or laboratory confirmation of disease should consider measles and mumps immunization with 2 doses of MMR vaccine or rubella immunization with 1 dose of MMR vaccine.  Pneumococcal 13-valent conjugate (PCV13) vaccine. When indicated, a person who is uncertain of his immunization history and has no record of immunization should receive the PCV13 vaccine. All adults 65 years of age and older should receive this  vaccine. An adult aged 19 years or older who has certain medical conditions and has not been previously immunized should receive 1 dose of PCV13 vaccine. This PCV13 should be followed with a dose of pneumococcal polysaccharide (PPSV23) vaccine. Adults who are at high risk for pneumococcal disease should obtain the PPSV23 vaccine at least 8 weeks after the dose of PCV13 vaccine. Adults older than 80 years of age who have normal immune system function should obtain the PPSV23 vaccine dose at least 1 year after the dose of PCV13 vaccine.  Pneumococcal polysaccharide (PPSV23) vaccine. When PCV13 is also indicated, PCV13 should be obtained first. All adults aged 65 years and older should be immunized. An adult younger than age 65 years who has certain medical conditions should be immunized. Any person who resides in a nursing home or long-term care facility should be immunized. An adult smoker should be immunized. People with an immunocompromised condition and certain other conditions should receive both PCV13 and PPSV23 vaccines. People with human immunodeficiency virus (HIV) infection should be immunized as soon as possible after diagnosis. Immunization during chemotherapy or radiation therapy should be avoided. Routine use of PPSV23 vaccine is not recommended for American Indians, [place] Natives, or people younger than 65 years unless there are medical conditions that require PPSV23 vaccine. When indicated, people who have unknown immunization and have no record of immunization should receive PPSV23 vaccine. One-time revaccination 5 years after the first dose of PPSV23 is recommended for people aged 19-64 years who have chronic kidney failure, nephrotic syndrome, asplenia, or immunocompromised conditions. People who received 1-2 doses of PPSV23 before age 65 years should receive another dose of PPSV23 vaccine at age 65 years or later if at least 5 years have passed since the previous dose. Doses of PPSV23 are not  needed for people immunized with PPSV23 at or after age 65 years.  Meningococcal vaccine. Adults with asplenia or persistent complement component deficiencies should receive 2 doses of quadrivalent meningococcal conjugate (MenACWY-D) vaccine. The doses should be obtained   at least 2 months apart. Microbiologists working with certain meningococcal bacteria, [place] recruits, people at risk during an outbreak, and people who travel to or live in countries with a high rate of meningitis should be immunized. A first-year college student up through age 34 years who is living in a residence hall should receive a dose if she did not receive a dose on or after her 16th birthday. Adults who have certain high-risk conditions should receive one or more doses of vaccine.  Hepatitis A vaccine. Adults who wish to be protected from this disease, have certain high-risk conditions, work with hepatitis A-infected animals, work in hepatitis A research labs, or travel to or work in countries with a high rate of hepatitis A should be immunized. Adults who were previously unvaccinated and who anticipate close contact with an international adoptee during the first 60 days after arrival in the [place] from a country with a high rate of hepatitis A should be immunized.  Hepatitis B vaccine. Adults who wish to be protected from this disease, have certain high-risk conditions, may be exposed to blood or other infectious body fluids, are household contacts or sex partners of hepatitis B positive people, are clients or workers in certain care facilities, or travel to or work in countries with a high rate of hepatitis B should be immunized.  Haemophilus influenzae type b (Hib) vaccine. A previously unvaccinated person with asplenia or sickle cell disease or having a scheduled splenectomy should receive 1 dose of Hib vaccine. Regardless of previous immunization, a recipient of a hematopoietic stem cell transplant should receive a  3-dose series 6-12 months after her successful transplant. Hib vaccine is not recommended for adults with HIV infection. Preventive Services / Frequency Ages 35 to 4 years  Blood pressure check.** / Every 3-5 years.  Lipid and cholesterol check.** / Every 5 years beginning at age 60.  Clinical breast exam.** / Every 3 years for women in their 71s and 10s.  BRCA-related cancer risk assessment.** / For women who have family members with a BRCA-related cancer (breast, ovarian, tubal, or peritoneal cancers).  Pap test.** / Every 2 years from ages 76 through 26. Every 3 years starting at age 61 through age 76 or 93 with a history of 3 consecutive normal Pap tests.  HPV screening.** / Every 3 years from ages 37 through ages 60 to 51 with a history of 3 consecutive normal Pap tests.  Hepatitis C blood test.** / For any individual with known risks for hepatitis C.  Skin self-exam. / Monthly.  Influenza vaccine. / Every year.  Tetanus, diphtheria, and acellular pertussis (Tdap, Td) vaccine.** / Consult your health care provider. Pregnant women should receive 1 dose of Tdap vaccine during each pregnancy. 1 dose of Td every 10 years.  Varicella vaccine.** / Consult your health care provider. Pregnant females who do not have evidence of immunity should receive the first dose after pregnancy.  HPV vaccine. / 3 doses over 6 months, if 93 and younger. The vaccine is not recommended for use in pregnant females. However, pregnancy testing is not needed before receiving a dose.  Measles, mumps, rubella (MMR) vaccine.** / You need at least 1 dose of MMR if you were born in 1957 or later. You may also need a 2nd dose. For females of childbearing age, rubella immunity should be determined. If there is no evidence of immunity, females who are not pregnant should be vaccinated. If there is no evidence of immunity, females who are  pregnant should delay immunization until after pregnancy.  Pneumococcal  13-valent conjugate (PCV13) vaccine.** / Consult your health care provider.  Pneumococcal polysaccharide (PPSV23) vaccine.** / 1 to 2 doses if you smoke cigarettes or if you have certain conditions.  Meningococcal vaccine.** / 1 dose if you are age 68 to 8 years and a Market researcher living in a residence hall, or have one of several medical conditions, you need to get vaccinated against meningococcal disease. You may also need additional booster doses.  Hepatitis A vaccine.** / Consult your health care provider.  Hepatitis B vaccine.** / Consult your health care provider.  Haemophilus influenzae type b (Hib) vaccine.** / Consult your health care provider. Ages 7 to 53 years  Blood pressure check.** / Every year.  Lipid and cholesterol check.** / Every 5 years beginning at age 25 years.  Lung cancer screening. / Every year if you are aged 11-80 years and have a 30-pack-year history of smoking and currently smoke or have quit within the past 15 years. Yearly screening is stopped once you have quit smoking for at least 15 years or develop a health problem that would prevent you from having lung cancer treatment.  Clinical breast exam.** / Every year after age 48 years.  BRCA-related cancer risk assessment.** / For women who have family members with a BRCA-related cancer (breast, ovarian, tubal, or peritoneal cancers).  Mammogram.** / Every year beginning at age 41 years and continuing for as long as you are in good health. Consult with your health care provider.  Pap test.** / Every 3 years starting at age 65 years through age 37 or 70 years with a history of 3 consecutive normal Pap tests.  HPV screening.** / Every 3 years from ages 72 years through ages 60 to 40 years with a history of 3 consecutive normal Pap tests.  Fecal occult blood test (FOBT) of stool. / Every year beginning at age 21 years and continuing until age 5 years. You may not need to do this test if you get  a colonoscopy every 10 years.  Flexible sigmoidoscopy or colonoscopy.** / Every 5 years for a flexible sigmoidoscopy or every 10 years for a colonoscopy beginning at age 35 years and continuing until age 48 years.  Hepatitis C blood test.** / For all people born from [birth year] through 1965 and any individual with known risks for hepatitis C.  Skin self-exam. / Monthly.  Influenza vaccine. / Every year.  Tetanus, diphtheria, and acellular pertussis (Tdap/Td) vaccine.** / Consult your health care provider. Pregnant women should receive 1 dose of Tdap vaccine during each pregnancy. 1 dose of Td every 10 years.  Varicella vaccine.** / Consult your health care provider. Pregnant females who do not have evidence of immunity should receive the first dose after pregnancy.  Zoster vaccine.** / 1 dose for adults aged 30 years or older.  Measles, mumps, rubella (MMR) vaccine.** / You need at least 1 dose of MMR if you were born in 1957 or later. You may also need a second dose. For females of childbearing age, rubella immunity should be determined. If there is no evidence of immunity, females who are not pregnant should be vaccinated. If there is no evidence of immunity, females who are pregnant should delay immunization until after pregnancy.  Pneumococcal 13-valent conjugate (PCV13) vaccine.** / Consult your health care provider.  Pneumococcal polysaccharide (PPSV23) vaccine.** / 1 to 2 doses if you smoke cigarettes or if you have certain conditions.  Meningococcal vaccine.** /  Consult your health care provider.  Hepatitis A vaccine.** / Consult your health care provider.  Hepatitis B vaccine.** / Consult your health care provider.  Haemophilus influenzae type b (Hib) vaccine.** / Consult your health care provider. Ages 64 years and over  Blood pressure check.** / Every year.  Lipid and cholesterol check.** / Every 5 years beginning at age 23 years.  Lung cancer screening. / Every year if you  are aged 16-80 years and have a 30-pack-year history of smoking and currently smoke or have quit within the past 15 years. Yearly screening is stopped once you have quit smoking for at least 15 years or develop a health problem that would prevent you from having lung cancer treatment.  Clinical breast exam.** / Every year after age 74 years.  BRCA-related cancer risk assessment.** / For women who have family members with a BRCA-related cancer (breast, ovarian, tubal, or peritoneal cancers).  Mammogram.** / Every year beginning at age 44 years and continuing for as long as you are in good health. Consult with your health care provider.  Pap test.** / Every 3 years starting at age 58 years through age 22 or 39 years with 3 consecutive normal Pap tests. Testing can be stopped between 65 and 70 years with 3 consecutive normal Pap tests and no abnormal Pap or HPV tests in the past 10 years.  HPV screening.** / Every 3 years from ages 64 years through ages 70 or 61 years with a history of 3 consecutive normal Pap tests. Testing can be stopped between 65 and 70 years with 3 consecutive normal Pap tests and no abnormal Pap or HPV tests in the past 10 years.  Fecal occult blood test (FOBT) of stool. / Every year beginning at age 40 years and continuing until age 27 years. You may not need to do this test if you get a colonoscopy every 10 years.  Flexible sigmoidoscopy or colonoscopy.** / Every 5 years for a flexible sigmoidoscopy or every 10 years for a colonoscopy beginning at age 7 years and continuing until age 32 years.  Hepatitis C blood test.** / For all people born from [birth year] through 1963 and any individual with known risks for hepatitis C.  Osteoporosis screening.** / A one-time screening for women ages 30 years and over and women at risk for fractures or osteoporosis.  Skin self-exam. / Monthly.  Influenza vaccine. / Every year.  Tetanus, diphtheria, and acellular pertussis (Tdap/Td)  vaccine.** / 1 dose of Td every 10 years.  Varicella vaccine.** / Consult your health care provider.  Zoster vaccine.** / 1 dose for adults aged 35 years or older.  Pneumococcal 13-valent conjugate (PCV13) vaccine.** / Consult your health care provider.  Pneumococcal polysaccharide (PPSV23) vaccine.** / 1 dose for all adults aged 46 years and older.  Meningococcal vaccine.** / Consult your health care provider.  Hepatitis A vaccine.** / Consult your health care provider.  Hepatitis B vaccine.** / Consult your health care provider.  Haemophilus influenzae type b (Hib) vaccine.** / Consult your health care provider. ** Family history and personal history of risk and conditions may change your health care provider's recommendations.   This information is not intended to replace advice given to you by your health care provider. Make sure you discuss any questions you have with your health care provider.   Document Released: 09/19/2001 Document Revised: 08/14/2014 Document Reviewed: 12/19/2010 Elsevier Interactive Patient Education [employer].

## 2015-10-13 NOTE — Telephone Encounter (Signed)
Patient husband was returning your call. Advised appeared to be for lab results. He states that he will bve available for the rest of the afternoon

## 2015-10-14 NOTE — Telephone Encounter (Signed)
Gave results. Also documented in labs tab.

## 2015-10-14 NOTE — Assessment & Plan Note (Signed)

## 2015-10-15 DIAGNOSIS — S81801D Unspecified open wound, right lower leg, subsequent encounter: Secondary | ICD-10-CM | POA: Diagnosis not present

## 2015-10-15 DIAGNOSIS — E119 Type 2 diabetes mellitus without complications: Secondary | ICD-10-CM | POA: Diagnosis not present

## 2015-10-19 DIAGNOSIS — E119 Type 2 diabetes mellitus without complications: Secondary | ICD-10-CM | POA: Diagnosis not present

## 2015-10-19 DIAGNOSIS — S81801D Unspecified open wound, right lower leg, subsequent encounter: Secondary | ICD-10-CM | POA: Diagnosis not present

## 2015-10-22 DIAGNOSIS — S81801D Unspecified open wound, right lower leg, subsequent encounter: Secondary | ICD-10-CM | POA: Diagnosis not present

## 2015-10-22 DIAGNOSIS — E119 Type 2 diabetes mellitus without complications: Secondary | ICD-10-CM | POA: Diagnosis not present

## 2015-10-25 ENCOUNTER — Telehealth: Payer: Self-pay

## 2015-10-25 DIAGNOSIS — E119 Type 2 diabetes mellitus without complications: Secondary | ICD-10-CM | POA: Diagnosis not present

## 2015-10-25 NOTE — Telephone Encounter (Signed)
Home Health Cert/Plan of Care received (10/15/2015 - 12/13/2015) and placed on MD's desk for signature  

## 2015-10-26 NOTE — Telephone Encounter (Signed)
Paperwork signed, faxed, copy sent to scan 

## 2015-10-27 ENCOUNTER — Other Ambulatory Visit: Payer: Medicare Other

## 2015-10-27 ENCOUNTER — Ambulatory Visit (INDEPENDENT_AMBULATORY_CARE_PROVIDER_SITE_OTHER): Payer: Medicare Other | Admitting: Internal Medicine

## 2015-10-27 ENCOUNTER — Encounter: Payer: Self-pay | Admitting: Internal Medicine

## 2015-10-27 VITALS — BP 120/80 | HR 68 | Temp 98.0°F | Resp 16 | Ht 61.0 in | Wt 130.0 lb

## 2015-10-27 DIAGNOSIS — R300 Dysuria: Secondary | ICD-10-CM

## 2015-10-27 DIAGNOSIS — Q6102 Congenital multiple renal cysts: Secondary | ICD-10-CM | POA: Diagnosis not present

## 2015-10-27 DIAGNOSIS — R3129 Other microscopic hematuria: Secondary | ICD-10-CM

## 2015-10-27 DIAGNOSIS — I2510 Atherosclerotic heart disease of native coronary artery without angina pectoris: Secondary | ICD-10-CM

## 2015-10-27 LAB — POCT URINALYSIS DIPSTICK
Bilirubin, UA: NEGATIVE
Blood, UA: 80
Glucose, UA: NEGATIVE
KETONES UA: NEGATIVE
LEUKOCYTES UA: NEGATIVE
NITRITE UA: NEGATIVE
PH UA: 6
PROTEIN UA: NEGATIVE
Spec Grav, UA: 1.025
UROBILINOGEN UA: 0.2

## 2015-10-27 NOTE — Patient Instructions (Signed)

## 2015-10-27 NOTE — Progress Notes (Signed)
Pre visit review using our clinic review tool, if applicable. No additional management support is needed unless otherwise documented below in the visit note. 

## 2015-10-28 DIAGNOSIS — Q6102 Congenital multiple renal cysts: Secondary | ICD-10-CM | POA: Insufficient documentation

## 2015-10-28 LAB — URINE CULTURE

## 2015-10-28 NOTE — Progress Notes (Signed)
Subjective:  Patient ID: Audrey Reed, female    DOB: [birth year]  Age: 82 y.o. MRN: [id number]  CC: Urinary Tract Infection   HPI Audrey Reed presents for Follow-up on a bladder infection after being recently treated with a course of Bactrim DS. Her only complaint is urinary frequency. She denies dysuria, hematuria, abdominal/flank plain, nausea, vomiting, fever, chills.  Outpatient Prescriptions Prior to Visit  Medication Sig Dispense Refill  . acetaminophen (TYLENOL) 325 MG tablet Take 2 tablets (650 mg total) by mouth every 6 (six) hours as needed (or Fever >/= 101).    . carvedilol (COREG) 12.5 MG tablet Take 1 tablet (12.5 mg total) by mouth 2 (two) times daily with a meal. 180 tablet 2  . Cholecalciferol (VITAMIN D3) 1000 units CAPS Take 2 capsules (2,000 Units total) by mouth daily. 90 capsule 3  . feeding supplement (ENSURE) PUDG Take 1 Container by mouth 3 (three) times daily between meals.    . ferrous sulfate (FEOSOL) 325 (65 FE) MG tablet Take 1 tablet (325 mg total) by mouth 2 (two) times daily with a meal. 180 tablet 3  . fluticasone (FLONASE) 50 MCG/ACT nasal spray Place 2 sprays into both nostrils daily. 16 g 6  . furosemide (LASIX) 40 MG tablet take 1 tablet by mouth every morning 90 tablet 1  . Multiple Vitamins-Minerals (CERTAVITE SENIOR/ANTIOXIDANT) TABS Take by mouth.    . saccharomyces boulardii (FLORASTOR) 250 MG capsule Take 1 capsule (250 mg total) by mouth daily. 30 capsule 11  . simvastatin (ZOCOR) 20 MG tablet take 1 tablet by mouth at bedtime 90 tablet 3   No facility-administered medications prior to visit.    ROS Review of Systems  Constitutional: Negative.  Negative for fever, chills, diaphoresis, appetite change and fatigue.  HENT: Negative.   Eyes: Negative.   Respiratory: Negative.  Negative for cough, choking, chest tightness, shortness of breath and stridor.   Cardiovascular: Negative.  Negative for chest pain, palpitations and leg swelling.    Gastrointestinal: Negative.  Negative for nausea, vomiting, abdominal pain, diarrhea and constipation.  Endocrine: Negative.   Genitourinary: Positive for frequency. Negative for dysuria, urgency, hematuria, flank pain, decreased urine volume and difficulty urinating.  Musculoskeletal: Negative.  Negative for myalgias, back pain, arthralgias and neck pain.  Skin: Negative.  Negative for pallor and rash.  Allergic/Immunologic: Negative.   Neurological: Negative.   Hematological: Negative.   Psychiatric/Behavioral: Negative.     Objective:  BP 120/80 mmHg  Pulse 68  Temp(Src) 98 F (36.7 C) (Oral)  Resp 16  Ht 5\' 1"  (1.549 m)  Wt 130 lb (58.968 kg)  BMI 24.58 kg/m2  SpO2 98%  BP Readings from Last 3 Encounters:  10/27/15 120/80  10/13/15 144/82  03/16/15 160/70    Wt Readings from Last 3 Encounters:  10/27/15 130 lb (58.968 kg)  10/13/15 131 lb (59.421 kg)  03/16/15 122 lb 2 oz (55.396 kg)    Physical Exam  Constitutional: She is oriented to person, place, and time.  Non-toxic appearance. She does not have a sickly appearance. She does not appear ill. No distress.  HENT:  Mouth/Throat: Oropharynx is clear and moist. No oropharyngeal exudate.  Eyes: Conjunctivae are normal. Right eye exhibits no discharge. Left eye exhibits no discharge. No scleral icterus.  Neck: Normal range of motion. Neck supple. No JVD present. No tracheal deviation present. No thyromegaly present.  Cardiovascular: Normal rate and intact distal pulses.  Exam reveals no gallop and no friction rub.  Murmur heard. Pulmonary/Chest: Effort normal and breath sounds normal. No stridor. No respiratory distress. She has no wheezes. She has no rales. She exhibits no tenderness.  Abdominal: Soft. Bowel sounds are normal. She exhibits no distension and no mass. There is no hepatosplenomegaly. There is no tenderness. There is no rebound, no guarding and no CVA tenderness.  Musculoskeletal: Normal range of motion.  She exhibits no edema or tenderness.  Lymphadenopathy:    She has no cervical adenopathy.  Neurological: She is oriented to person, place, and time.  Skin: Skin is warm and dry. No rash noted. She is not diaphoretic. No erythema. No pallor.  Vitals reviewed.   Lab Results  Component Value Date   WBC 12.3* 10/13/2015   HGB 14.3 10/13/2015   HCT 42.2 10/13/2015   PLT 199.0 10/13/2015   GLUCOSE 138* 10/13/2015   CHOL 168 10/13/2015   TRIG 89.0 10/13/2015   HDL 72.90 10/13/2015   LDLCALC 78 10/13/2015   ALT 8 10/13/2015   AST 15 10/13/2015   NA 141 10/13/2015   K 4.3 10/13/2015   CL 103 10/13/2015   CREATININE 1.21* 10/13/2015   BUN 34* 10/13/2015   CO2 30 10/13/2015   TSH 2.31 10/13/2015   INR 1.31 08/04/2012   HGBA1C 6.0 10/13/2015    Ct Abdomen Pelvis Wo Contrast  10/08/2014  CLINICAL DATA:  Right lower quadrant pain for 10-12 days EXAM: CT ABDOMEN AND PELVIS WITHOUT CONTRAST TECHNIQUE: Multidetector CT imaging of the abdomen and pelvis was performed following the standard protocol without IV contrast. COMPARISON:  None. FINDINGS: Lung bases demonstrate bilateral small pleural effusions and mild bibasilar atelectatic changes. The liver, spleen, pancreas and adrenal glands are within normal limits. The gallbladder is well distended with some dependent gallstones. There are changes suggestive of mild gallbladder wall thickening although this may be accentuated by small amount of ascites. The kidneys are well visualized and demonstrate multiple hypodensities bilaterally likely representing cysts. The largest of these is on the left measuring 5.1 cm in greatest dimension. A small 1 cm hyperdensity is noted likely representing a hemorrhagic cyst. This is best seen on image number 22 of series 201. No calculi or obstructive changes are noted. The appendix is not well visualized although no right lower quadrant inflammatory changes are seen to suggest appendicitis. Small amount of ascites is  seen. Diverticulosis of the sigmoid colon is noted with significant spur of muscle hypertrophy. No definitive diverticulitis is seen. The bladder is partially distended. No pelvic mass lesion is seen. No acute bony abnormality is noted. IMPRESSION: Bilateral small pleural effusions with bibasilar atelectasis. Mild ascites. Cholelithiasis with changes suggestive of gallbladder wall thickening although this may be accentuated by the mild ascites. Multiple renal cysts without obstructive change. Diverticulosis without definitive diverticulitis. The appendix is not well seen although no significant inflammatory changes are noted. Electronically Signed   By: Inez Catalina M.D.   On: 10/08/2014 22:50   Urine dipstick shows positive for RBC's.  Micro exam: no WBC's per HPF.  Assessment & Plan:   Audrey Reed was seen today for urinary tract infection.  Diagnoses and all orders for this visit:  Hematuria, microscopic- she has persistent hematuria and a prior history of smoking, will get a CT scan with contrast of the abdomen and pelvis to screen for mass in her kidneys and bladder -     Cancel: CT Abdomen Pelvis W Contrast; Future -     CT Abdomen Pelvis W Contrast; Future  Dysuria- she has  persistent hematuria but is otherwise asymptomatic, I will reculture her urine and treat if indicated. -     POCT urinalysis dipstick -     Urine Culture; Future  Multiple renal cysts- she had renal cyst noted on the CT scan that was done about a year ago, will recheck the CT scan with contrast to see if one of the cyst has become complicated with hemorrhage, infection and/or malignant transformation. -     CT Abdomen Pelvis W Contrast; Future   I am having Audrey Reed maintain her acetaminophen, feeding supplement (ENSURE), saccharomyces boulardii, fluticasone, CERTAVITE SENIOR/ANTIOXIDANT, simvastatin, carvedilol, Vitamin D3, ferrous sulfate, and furosemide.  No orders of the defined types were placed in this encounter.      Follow-up: Return in about 2 months (around 12/27/2015).  Audrey Calico, MD

## 2015-10-29 DIAGNOSIS — E119 Type 2 diabetes mellitus without complications: Secondary | ICD-10-CM | POA: Diagnosis not present

## 2015-10-29 DIAGNOSIS — S81801D Unspecified open wound, right lower leg, subsequent encounter: Secondary | ICD-10-CM | POA: Diagnosis not present

## 2015-11-02 ENCOUNTER — Ambulatory Visit (INDEPENDENT_AMBULATORY_CARE_PROVIDER_SITE_OTHER)
Admission: RE | Admit: 2015-11-02 | Discharge: 2015-11-02 | Disposition: A | Discharge status: Home / Self Care | Payer: Medicare Other | Source: Ambulatory Visit | Attending: Internal Medicine | Admitting: Internal Medicine

## 2015-11-02 DIAGNOSIS — K5730 Diverticulosis of large intestine without perforation or abscess without bleeding: Secondary | ICD-10-CM | POA: Diagnosis not present

## 2015-11-02 DIAGNOSIS — R3129 Other microscopic hematuria: Secondary | ICD-10-CM | POA: Diagnosis not present

## 2015-11-02 DIAGNOSIS — Q6102 Congenital multiple renal cysts: Secondary | ICD-10-CM | POA: Diagnosis not present

## 2015-11-02 MED ORDER — IOPAMIDOL (ISOVUE-300) INJECTION 61%
80.0000 mL | Freq: Once | INTRAVENOUS | Status: AC | PRN
  Administered 2015-11-02: 80 mL via INTRAVENOUS

## 2015-11-03 ENCOUNTER — Encounter: Payer: Self-pay | Admitting: Internal Medicine

## 2015-11-03 DIAGNOSIS — S81801D Unspecified open wound, right lower leg, subsequent encounter: Secondary | ICD-10-CM | POA: Diagnosis not present

## 2015-11-03 DIAGNOSIS — E119 Type 2 diabetes mellitus without complications: Secondary | ICD-10-CM | POA: Diagnosis not present

## 2015-11-05 DIAGNOSIS — S81801D Unspecified open wound, right lower leg, subsequent encounter: Secondary | ICD-10-CM | POA: Diagnosis not present

## 2015-11-05 DIAGNOSIS — E119 Type 2 diabetes mellitus without complications: Secondary | ICD-10-CM | POA: Diagnosis not present

## 2015-11-09 ENCOUNTER — Ambulatory Visit: Payer: Medicare Other | Admitting: Podiatry

## 2015-11-17 DIAGNOSIS — E119 Type 2 diabetes mellitus without complications: Secondary | ICD-10-CM | POA: Diagnosis not present

## 2015-11-17 DIAGNOSIS — S81801D Unspecified open wound, right lower leg, subsequent encounter: Secondary | ICD-10-CM | POA: Diagnosis not present

## 2015-12-07 ENCOUNTER — Telehealth: Payer: Self-pay | Admitting: Podiatry

## 2015-12-07 NOTE — Telephone Encounter (Signed)
Left voicemail for pt son to call to schedule appt

## 2015-12-23 ENCOUNTER — Encounter: Payer: Self-pay | Admitting: Internal Medicine

## 2015-12-23 ENCOUNTER — Ambulatory Visit (INDEPENDENT_AMBULATORY_CARE_PROVIDER_SITE_OTHER): Payer: Medicare Other | Admitting: Internal Medicine

## 2015-12-23 VITALS — BP 126/84 | HR 63 | Temp 97.8°F | Resp 16 | Ht 61.0 in | Wt 132.0 lb

## 2015-12-23 DIAGNOSIS — L720 Epidermal cyst: Secondary | ICD-10-CM | POA: Diagnosis not present

## 2015-12-23 DIAGNOSIS — I2510 Atherosclerotic heart disease of native coronary artery without angina pectoris: Secondary | ICD-10-CM

## 2015-12-23 DIAGNOSIS — C50919 Malignant neoplasm of unspecified site of unspecified female breast: Secondary | ICD-10-CM

## 2015-12-23 DIAGNOSIS — I10 Essential (primary) hypertension: Secondary | ICD-10-CM | POA: Diagnosis not present

## 2015-12-23 NOTE — Progress Notes (Signed)
Subjective:  Patient ID: Audrey Reed, female    DOB: 30-Aug-1917  Age: 82 y.o. MRN: [id number]  CC: Hypertension   HPI Audrey Reed presents for follow-up, she recently underwent a CT scan of the abdomen and pelvis for workup of hematuria. Her renal system was ok but there was some concern about a subcutaneous cyst on her left upper chest wall that needs further evaluation. She feels well today and offers no complaints. She tells me her blood pressure has been well controlled with a combination of carvedilol and Lasix.  Outpatient Prescriptions Prior to Visit  Medication Sig Dispense Refill  . acetaminophen (TYLENOL) 325 MG tablet Take 2 tablets (650 mg total) by mouth every 6 (six) hours as needed (or Fever >/= 101).    . carvedilol (COREG) 12.5 MG tablet Take 1 tablet (12.5 mg total) by mouth 2 (two) times daily with a meal. 180 tablet 2  . Cholecalciferol (VITAMIN D3) 1000 units CAPS Take 2 capsules (2,000 Units total) by mouth daily. 90 capsule 3  . feeding supplement (ENSURE) PUDG Take 1 Container by mouth 3 (three) times daily between meals.    . ferrous sulfate (FEOSOL) 325 (65 FE) MG tablet Take 1 tablet (325 mg total) by mouth 2 (two) times daily with a meal. 180 tablet 3  . fluticasone (FLONASE) 50 MCG/ACT nasal spray Place 2 sprays into both nostrils daily. 16 g 6  . furosemide (LASIX) 40 MG tablet take 1 tablet by mouth every morning 90 tablet 1  . Multiple Vitamins-Minerals (CERTAVITE SENIOR/ANTIOXIDANT) TABS Take by mouth.    . saccharomyces boulardii (FLORASTOR) 250 MG capsule Take 1 capsule (250 mg total) by mouth daily. 30 capsule 11  . simvastatin (ZOCOR) 20 MG tablet take 1 tablet by mouth at bedtime 90 tablet 3   No facility-administered medications prior to visit.    ROS Review of Systems  Constitutional: Negative.  Negative for appetite change, fatigue and unexpected weight change.  HENT: Negative.   Eyes: Negative.  Negative for visual disturbance.    Respiratory: Negative.  Negative for cough, choking, chest tightness and shortness of breath.   Cardiovascular: Negative.  Negative for chest pain, palpitations and leg swelling.  Gastrointestinal: Negative.  Negative for nausea, abdominal pain, diarrhea and constipation.  Endocrine: Negative.   Genitourinary: Negative.  Negative for difficulty urinating.  Musculoskeletal: Negative.  Negative for myalgias, back pain, joint swelling and arthralgias.  Skin: Negative.  Negative for color change and rash.  Allergic/Immunologic: Negative.   Neurological: Negative.  Negative for dizziness, light-headedness, numbness and headaches.  Hematological: Negative.  Negative for adenopathy. Does not bruise/bleed easily.  Psychiatric/Behavioral: Negative.     Objective:  BP 126/84 mmHg  Pulse 63  Temp(Src) 97.8 F (36.6 C) (Oral)  Resp 16  Ht 5\' 1"  (1.549 m)  Wt 132 lb (59.875 kg)  BMI 24.95 kg/m2  SpO2 97%  BP Readings from Last 3 Encounters:  12/23/15 126/84  10/27/15 120/80  10/13/15 144/82    Wt Readings from Last 3 Encounters:  12/23/15 132 lb (59.875 kg)  10/27/15 130 lb (58.968 kg)  10/13/15 131 lb (59.421 kg)    Physical Exam  Constitutional: She is oriented to person, place, and time. No distress.  HENT:  Mouth/Throat: Oropharynx is clear and moist. No oropharyngeal exudate.  Eyes: Conjunctivae are normal. Right eye exhibits no discharge. Left eye exhibits no discharge. No scleral icterus.  Neck: Normal range of motion. Neck supple. No JVD present. No tracheal deviation  present. No thyromegaly present.  Cardiovascular: Normal rate, regular rhythm and intact distal pulses.  Exam reveals no gallop and no friction rub.   Murmur heard.  Systolic murmur is present with a grade of 1/6   No diastolic murmur is present  Harsh 1/6 SEM  Pulmonary/Chest: Effort normal and breath sounds normal. No stridor. No respiratory distress. She has no wheezes. She has no rales. She exhibits no  mass. Right breast exhibits no inverted nipple, no mass, no nipple discharge, no skin change and no tenderness. Left breast exhibits no inverted nipple, no mass, no nipple discharge, no skin change and no tenderness. Breasts are symmetrical.    Abdominal: Soft. Bowel sounds are normal. She exhibits no distension and no mass. There is no tenderness. There is no rebound and no guarding.  Musculoskeletal: Normal range of motion. She exhibits no edema or tenderness.  Lymphadenopathy:    She has no cervical adenopathy.  Neurological: She is oriented to person, place, and time.  Skin: Skin is warm and dry. No rash noted. She is not diaphoretic. No erythema. No pallor.  Vitals reviewed.   Lab Results  Component Value Date   WBC 12.3* 10/13/2015   HGB 14.3 10/13/2015   HCT 42.2 10/13/2015   PLT 199.0 10/13/2015   GLUCOSE 138* 10/13/2015   CHOL 168 10/13/2015   TRIG 89.0 10/13/2015   HDL 72.90 10/13/2015   LDLCALC 78 10/13/2015   ALT 8 10/13/2015   AST 15 10/13/2015   NA 141 10/13/2015   K 4.3 10/13/2015   CL 103 10/13/2015   CREATININE 1.21* 10/13/2015   BUN 34* 10/13/2015   CO2 30 10/13/2015   TSH 2.31 10/13/2015   INR 1.31 08/04/2012   HGBA1C 6.0 10/13/2015    Ct Abdomen Pelvis W Wo Contrast  11/02/2015  CLINICAL DATA:  82 year old female with history of microscopic hematuria. Evaluate for potential bladder or ureteral mass. History of renal cysts. Additional history of left-sided breast cancer diagnosed in 2006 status post lumpectomy. EXAM: CT ABDOMEN AND PELVIS WITHOUT AND WITH CONTRAST TECHNIQUE: Multidetector CT imaging of the abdomen and pelvis was performed following the standard protocol before and following the bolus administration of intravenous contrast. CONTRAST:  50mL ISOVUE-300 IOPAMIDOL (ISOVUE-300) INJECTION 61% COMPARISON:  CT the abdomen and pelvis 10/08/2014. FINDINGS: Lower chest: Multifocal bronchial wall thickening with peribronchovascular micronodularity  throughout the lung bases, particularly in the right middle lobe and inferior segment of lingula, which could suggest underlying chronic indolent atypical infectious process such is mycobacterium avium intracellulare (MAI), which is increased compared to the prior study. Severe calcifications of the aortic valve. Atherosclerotic calcifications in the right coronary artery. Mild calcifications of the mitral annulus. In the lateral aspect of the lower right chest wall (image 8 of series 6) there is a slightly exophytic cutaneous lesion measuring 1.2 x 1.7 cm which is high attenuation on precontrast images. Hepatobiliary: No suspicious cystic or solid hepatic lesions. No intra or extrahepatic biliary ductal dilatation. Gallbladder is normal in appearance. Pancreas: No pancreatic mass. No pancreatic ductal dilatation. No pancreatic or peripancreatic fluid or inflammatory changes. Spleen: Unremarkable. Adrenals/Urinary Tract: There are no calcifications within the collecting system of either kidney, along the course of either ureter, or within the lumen of the urinary bladder. No hydroureteronephrosis or perinephric stranding to indicate urinary tract obstruction at this time. There multiple low-attenuation lesions in the kidneys bilaterally, compatible with simple cysts, the largest of which is exophytic measuring 4.8 cm in the posterior aspect of the  interpolar region of the left kidney. Several sub cm low-attenuation lesions are also noted in the kidneys bilaterally, too small to characterize, but favored to represent tiny cysts. On postcontrast delayed images there are no definite filling defects within the collecting system of either kidney, along the course of either ureter, or within the lumen of the urinary bladder to strongly suggest presence of a urothelial neoplasm at this time. Stomach/Bowel: Normal appearance of the stomach. No pathologic dilatation of small bowel or colon. Numerous colonic diverticulae are  noted, most evident in the descending colon and sigmoid colon, without surrounding inflammatory changes to suggest an acute diverticulitis at this time. Vascular/Lymphatic: Extensive atherosclerosis throughout the abdominal and pelvic vasculature, without evidence of aneurysm or dissection. No lymphadenopathy noted in the abdomen or pelvis. Reproductive: Uterus and ovaries are atrophic. Other: No significant volume of ascites.  No pneumoperitoneum. Musculoskeletal: Chronic appearing compression fracture of L3 with approximately 80% loss of anterior vertebral body height and 30% loss of posterior vertebral body height is slightly increased compared to the prior examination. There are no aggressive appearing lytic or blastic lesions noted in the visualized portions of the skeleton. IMPRESSION: 1. No definite source for microscopic hematuria identified on today's examination. There are multiple renal lesions bilaterally. The largest of these are compatible with simple cysts. The smallest of these are too small to definitively characterize, but are also favored to represent small cysts. 2. There are calcifications of the aortic valve. Echocardiographic correlation for evaluation of potential valvular dysfunction may be warranted if clinically indicated. 3. Extensive atherosclerosis, including right coronary artery disease. 4. Colonic diverticulosis without evidence of acute diverticulitis at this time. 5. Exophytic high attenuation cutaneous lesion in the lateral aspect of the lower right chest wall, as above. This should be amenable to direct inspection at physical examination. Electronically Signed   By: Vinnie Langton M.D.   On: 11/02/2015 18:07    Assessment & Plan:   Charnea was seen today for hypertension.  Diagnoses and all orders for this visit:  Malignant neoplasm of female breast, unspecified laterality, unspecified site of breast ([place])- there is no evidence of recurrence of breast cancer at this  time.  Essential hypertension- her blood pressure is well-controlled  EIC (epidermal inclusion cyst)- this is what was seen on her CT scan, it was drained today.   I am having Ms. Treichler maintain her acetaminophen, feeding supplement (ENSURE), saccharomyces boulardii, fluticasone, CERTAVITE SENIOR/ANTIOXIDANT, simvastatin, carvedilol, Vitamin D3, ferrous sulfate, and furosemide.  No orders of the defined types were placed in this encounter.     Follow-up: Return in about 3 months (around 03/24/2016).  Scarlette Calico, MD

## 2015-12-23 NOTE — Progress Notes (Signed)
Pre visit review using our clinic review tool, if applicable. No additional management support is needed unless otherwise documented below in the visit note. 

## 2015-12-23 NOTE — Patient Instructions (Signed)
Epidermal Cyst An epidermal cyst is sometimes called a sebaceous cyst, epidermal inclusion cyst, or infundibular cyst. These cysts usually contain a substance that looks "pasty" or "cheesy" and may have a bad smell. This substance is a protein called keratin. Epidermal cysts are usually found on the face, neck, or trunk. They may also occur in the vaginal area or other parts of the genitalia of both men and women. Epidermal cysts are usually small, painless, slow-growing bumps or lumps that move freely under the skin. It is important not to try to pop them. This may cause an infection and lead to tenderness and swelling. CAUSES  Epidermal cysts may be caused by a deep penetrating injury to the skin or a plugged hair follicle, often associated with acne. SYMPTOMS  Epidermal cysts can become inflamed and cause:  Redness.  Tenderness.  Increased temperature of the skin over the bumps or lumps.  Grayish-white, bad smelling material that drains from the bump or lump. DIAGNOSIS  Epidermal cysts are easily diagnosed by your caregiver during an exam. Rarely, a tissue sample (biopsy) may be taken to rule out other conditions that may resemble epidermal cysts. TREATMENT   Epidermal cysts often get better and disappear on their own. They are rarely ever cancerous.  If a cyst becomes infected, it may become inflamed and tender. This may require opening and draining the cyst. Treatment with antibiotics may be necessary. When the infection is gone, the cyst may be removed with minor surgery.  Small, inflamed cysts can often be treated with antibiotics or by injecting steroid medicines.  Sometimes, epidermal cysts become large and bothersome. If this happens, surgical removal in your caregiver's office may be necessary. HOME CARE INSTRUCTIONS  Only take over-the-counter or prescription medicines as directed by your caregiver.  Take your antibiotics as directed. Finish them even if you start to feel  better. SEEK MEDICAL CARE IF:   Your cyst becomes tender, red, or swollen.  Your condition is not improving or is getting worse.  You have any other questions or concerns. MAKE SURE YOU:  Understand these instructions.  Will watch your condition.  Will get help right away if you are not doing well or get worse.   This information is not intended to replace advice given to you by your health care provider. Make sure you discuss any questions you have with your health care provider.   Document Released: 06/24/2004 Document Revised: 10/16/2011 Document Reviewed: 01/30/2011 Elsevier Interactive Patient Education 2016 Elsevier Inc.  

## 2015-12-26 DIAGNOSIS — L720 Epidermal cyst: Secondary | ICD-10-CM | POA: Insufficient documentation

## 2015-12-28 ENCOUNTER — Ambulatory Visit (INDEPENDENT_AMBULATORY_CARE_PROVIDER_SITE_OTHER): Payer: Medicare Other | Admitting: Podiatry

## 2015-12-28 ENCOUNTER — Encounter: Payer: Self-pay | Admitting: Podiatry

## 2015-12-28 DIAGNOSIS — I2510 Atherosclerotic heart disease of native coronary artery without angina pectoris: Secondary | ICD-10-CM | POA: Diagnosis not present

## 2015-12-28 DIAGNOSIS — M79676 Pain in unspecified toe(s): Secondary | ICD-10-CM

## 2015-12-28 DIAGNOSIS — B351 Tinea unguium: Secondary | ICD-10-CM | POA: Diagnosis not present

## 2015-12-28 NOTE — Patient Instructions (Signed)
Diabetes and Foot Care Diabetes may cause you to have problems because of poor blood supply (circulation) to your feet and legs. This may cause the skin on your feet to become thinner, break easier, and heal more slowly. Your skin may become dry, and the skin may peel and crack. You may also have nerve damage in your legs and feet causing decreased feeling in them. You may not notice minor injuries to your feet that could lead to infections or more serious problems. Taking care of your feet is one of the most important things you can do for yourself.  HOME CARE INSTRUCTIONS  Wear shoes at all times, even in the house. Do not go barefoot. Bare feet are easily injured.  Check your feet daily for blisters, cuts, and redness. If you cannot see the bottom of your feet, use a mirror or ask someone for help.  Wash your feet with warm water (do not use hot water) and mild soap. Then pat your feet and the areas between your toes until they are completely dry. Do not soak your feet as this can dry your skin.  Apply a moisturizing lotion or petroleum jelly (that does not contain alcohol and is unscented) to the skin on your feet and to dry, brittle toenails. Do not apply lotion between your toes.  Trim your toenails straight across. Do not dig under them or around the cuticle. File the edges of your nails with an emery board or nail file.  Do not cut corns or calluses or try to remove them with medicine.  Wear clean socks or stockings every day. Make sure they are not too tight. Do not wear knee-high stockings since they may decrease blood flow to your legs.  Wear shoes that fit properly and have enough cushioning. To break in new shoes, wear them for just a few hours a day. This prevents you from injuring your feet. Always look in your shoes before you put them on to be sure there are no objects inside.  Do not cross your legs. This may decrease the blood flow to your feet.  If you find a minor scrape,  cut, or break in the skin on your feet, keep it and the skin around it clean and dry. These areas may be cleansed with mild soap and water. Do not cleanse the area with peroxide, alcohol, or iodine.  When you remove an adhesive bandage, be sure not to damage the skin around it.  If you have a wound, look at it several times a day to make sure it is healing.  Do not use heating pads or hot water bottles. They may burn your skin. If you have lost feeling in your feet or legs, you may not know it is happening until it is too late.  Make sure your health care provider performs a complete foot exam at least annually or more often if you have foot problems. Report any cuts, sores, or bruises to your health care provider immediately. SEEK MEDICAL CARE IF:   You have an injury that is not healing.  You have cuts or breaks in the skin.  You have an ingrown nail.  You notice redness on your legs or feet.  You feel burning or tingling in your legs or feet.  You have pain or cramps in your legs and feet.  Your legs or feet are numb.  Your feet always feel cold. SEEK IMMEDIATE MEDICAL CARE IF:   There is increasing redness,   swelling, or pain in or around a wound.  There is a red line that goes up your leg.  Pus is coming from a wound.  You develop a fever or as directed by your health care provider.  You notice a bad smell coming from an ulcer or wound.   This information is not intended to replace advice given to you by your health care provider. Make sure you discuss any questions you have with your health care provider.   Document Released: 07/21/2000 Document Revised: 03/26/2013 Document Reviewed: 12/31/2012 Elsevier Interactive Patient Education 2016 Elsevier Inc.  

## 2015-12-28 NOTE — Progress Notes (Signed)
   Subjective:    Patient ID: Audrey Reed, female    DOB: 11/19/1917, 82 y.o.   MRN: [id number]  HPI This patient presents at her request complaining of elongated and thickened toenails and requests toenail debridement. She is a known diabetic and was last seen in office on 05/06/2014 and at that time was rescheduled in 3 months, however, did not present for follow-up until today  Patient denies any history of foot ulceration, claudication or amputation   Review of Systems  Musculoskeletal: Positive for gait problem.  Skin: Positive for color change.       Objective:   Physical Exam  Patient is responsive to questioning and appears to be orientated 3  Vascular: DP pulses 0/4 bilaterally PT pulses 1/4 bilaterally  Neurological: Vibratory sensation nonreactive bilaterally Sensation to 10 g monofilament wire intact 5/5 bilaterally Ankle reflex equal and reactive bilaterally  Dermatological: No open skin lesions bilaterally Hypertrophic brittle, discolored, elongated toenails 6-10  Musculoskeletal: Hammertoe deformity second fifth bilaterally There is no restriction in ankle, subtalar, midtarsal joints bilaterally Patient walks slowly with a roller walker         Assessment & Plan:   Assessment: Diabetic with diminished pedal pulses suggestive of peripheral arterial disease Neglected symptomatic onychomycoses 6-10  Plan: Debridement of toenails 6-10 mechanically and electrically without any bleeding  Reappoint intervals at 3 months or at patient's request

## 2016-05-07 DIAGNOSIS — Z23 Encounter for immunization: Secondary | ICD-10-CM | POA: Diagnosis not present

## 2016-06-20 ENCOUNTER — Other Ambulatory Visit: Payer: Self-pay | Admitting: Internal Medicine

## 2016-06-20 DIAGNOSIS — I10 Essential (primary) hypertension: Secondary | ICD-10-CM

## 2016-07-07 ENCOUNTER — Other Ambulatory Visit: Payer: Self-pay | Admitting: Internal Medicine

## 2016-07-07 DIAGNOSIS — I10 Essential (primary) hypertension: Secondary | ICD-10-CM

## 2016-07-07 DIAGNOSIS — E7800 Pure hypercholesterolemia, unspecified: Secondary | ICD-10-CM

## 2016-11-11 ENCOUNTER — Other Ambulatory Visit: Payer: Self-pay | Admitting: Internal Medicine

## 2016-11-11 DIAGNOSIS — D510 Vitamin B12 deficiency anemia due to intrinsic factor deficiency: Secondary | ICD-10-CM

## 2017-01-02 ENCOUNTER — Other Ambulatory Visit: Payer: Self-pay | Admitting: Internal Medicine

## 2017-01-02 DIAGNOSIS — I10 Essential (primary) hypertension: Secondary | ICD-10-CM

## 2017-03-29 ENCOUNTER — Other Ambulatory Visit: Payer: Self-pay | Admitting: Internal Medicine

## 2017-03-29 DIAGNOSIS — I10 Essential (primary) hypertension: Secondary | ICD-10-CM

## 2017-04-02 ENCOUNTER — Other Ambulatory Visit: Payer: Self-pay | Admitting: Internal Medicine

## 2017-04-02 ENCOUNTER — Telehealth: Payer: Self-pay | Admitting: Internal Medicine

## 2017-04-02 DIAGNOSIS — E7800 Pure hypercholesterolemia, unspecified: Secondary | ICD-10-CM

## 2017-04-02 DIAGNOSIS — I2510 Atherosclerotic heart disease of native coronary artery without angina pectoris: Secondary | ICD-10-CM

## 2017-04-02 DIAGNOSIS — I10 Essential (primary) hypertension: Secondary | ICD-10-CM

## 2017-04-02 MED ORDER — SIMVASTATIN 20 MG PO TABS: 20.0000 mg | ORAL_TABLET | Freq: Every day | ORAL | 1 refills | Status: AC

## 2017-04-02 NOTE — Telephone Encounter (Signed)
Notified son MD sent rx to [facility].../lm,b

## 2017-04-02 NOTE — Telephone Encounter (Signed)
Pt son came by and said that this pt is not wanting to come in to get her refills. Son stated that she is 43 and not able to get around very well   simvastatin (ZOCOR) 20 MG tablet [[id number]

## 2017-04-02 NOTE — Telephone Encounter (Signed)
RX sent

## 2017-04-03 MED ORDER — CARVEDILOL 12.5 MG PO TABS: 12.5000 mg | ORAL_TABLET | Freq: Two times a day (BID) | ORAL | 1 refills | Status: DC

## 2017-04-03 NOTE — Addendum Note (Signed)
Addended by: Earnstine Regal on: 04/03/2017 10:45 AM   Modules accepted: Orders

## 2017-04-03 NOTE — Telephone Encounter (Signed)
Sent rx for carvedilol...Audrey Reed

## 2017-04-03 NOTE — Telephone Encounter (Signed)
Pharmacy called stating they also need carvedilol (COREG) 12.5 MG tablet  Called in  Same pharmacy  Please advise

## 2017-05-24 ENCOUNTER — Inpatient Hospital Stay (HOSPITAL_COMMUNITY)
Admission: EM | Admit: 2017-05-24 | Discharge: 2017-05-29 | DRG: 308 | Disposition: A | Discharge status: Home / Self Care | Payer: Medicare Other | Attending: Internal Medicine | Admitting: Internal Medicine

## 2017-05-24 ENCOUNTER — Encounter (HOSPITAL_COMMUNITY): Payer: Self-pay | Admitting: Emergency Medicine

## 2017-05-24 ENCOUNTER — Emergency Department (HOSPITAL_COMMUNITY): Payer: Medicare Other

## 2017-05-24 DIAGNOSIS — M810 Age-related osteoporosis without current pathological fracture: Secondary | ICD-10-CM | POA: Diagnosis present

## 2017-05-24 DIAGNOSIS — Z66 Do not resuscitate: Secondary | ICD-10-CM | POA: Diagnosis present

## 2017-05-24 DIAGNOSIS — M1990 Unspecified osteoarthritis, unspecified site: Secondary | ICD-10-CM | POA: Diagnosis present

## 2017-05-24 DIAGNOSIS — F419 Anxiety disorder, unspecified: Secondary | ICD-10-CM | POA: Diagnosis present

## 2017-05-24 DIAGNOSIS — Y95 Nosocomial condition: Secondary | ICD-10-CM | POA: Diagnosis not present

## 2017-05-24 DIAGNOSIS — N183 Chronic kidney disease, stage 3 unspecified: Secondary | ICD-10-CM

## 2017-05-24 DIAGNOSIS — N390 Urinary tract infection, site not specified: Secondary | ICD-10-CM | POA: Diagnosis present

## 2017-05-24 DIAGNOSIS — Z9911 Dependence on respirator [ventilator] status: Secondary | ICD-10-CM

## 2017-05-24 DIAGNOSIS — N179 Acute kidney failure, unspecified: Secondary | ICD-10-CM | POA: Diagnosis present

## 2017-05-24 DIAGNOSIS — R278 Other lack of coordination: Secondary | ICD-10-CM | POA: Diagnosis not present

## 2017-05-24 DIAGNOSIS — J9602 Acute respiratory failure with hypercapnia: Secondary | ICD-10-CM | POA: Diagnosis present

## 2017-05-24 DIAGNOSIS — J188 Other pneumonia, unspecified organism: Secondary | ICD-10-CM | POA: Diagnosis not present

## 2017-05-24 DIAGNOSIS — F0390 Unspecified dementia without behavioral disturbance: Secondary | ICD-10-CM | POA: Diagnosis not present

## 2017-05-24 DIAGNOSIS — I340 Nonrheumatic mitral (valve) insufficiency: Secondary | ICD-10-CM | POA: Diagnosis not present

## 2017-05-24 DIAGNOSIS — J13 Pneumonia due to Streptococcus pneumoniae: Secondary | ICD-10-CM | POA: Diagnosis not present

## 2017-05-24 DIAGNOSIS — Z23 Encounter for immunization: Secondary | ICD-10-CM | POA: Diagnosis not present

## 2017-05-24 DIAGNOSIS — E1151 Type 2 diabetes mellitus with diabetic peripheral angiopathy without gangrene: Secondary | ICD-10-CM | POA: Diagnosis present

## 2017-05-24 DIAGNOSIS — I350 Nonrheumatic aortic (valve) stenosis: Secondary | ICD-10-CM | POA: Diagnosis not present

## 2017-05-24 DIAGNOSIS — I2510 Atherosclerotic heart disease of native coronary artery without angina pectoris: Secondary | ICD-10-CM | POA: Diagnosis present

## 2017-05-24 DIAGNOSIS — E7800 Pure hypercholesterolemia, unspecified: Secondary | ICD-10-CM | POA: Diagnosis present

## 2017-05-24 DIAGNOSIS — Z853 Personal history of malignant neoplasm of breast: Secondary | ICD-10-CM | POA: Diagnosis not present

## 2017-05-24 DIAGNOSIS — E872 Acidosis: Secondary | ICD-10-CM | POA: Diagnosis present

## 2017-05-24 DIAGNOSIS — Z9010 Acquired absence of unspecified breast and nipple: Secondary | ICD-10-CM

## 2017-05-24 DIAGNOSIS — I272 Pulmonary hypertension, unspecified: Secondary | ICD-10-CM | POA: Diagnosis present

## 2017-05-24 DIAGNOSIS — R2681 Unsteadiness on feet: Secondary | ICD-10-CM | POA: Diagnosis not present

## 2017-05-24 DIAGNOSIS — J9600 Acute respiratory failure, unspecified whether with hypoxia or hypercapnia: Secondary | ICD-10-CM | POA: Diagnosis not present

## 2017-05-24 DIAGNOSIS — G9341 Metabolic encephalopathy: Secondary | ICD-10-CM | POA: Diagnosis present

## 2017-05-24 DIAGNOSIS — R05 Cough: Secondary | ICD-10-CM | POA: Diagnosis not present

## 2017-05-24 DIAGNOSIS — I469 Cardiac arrest, cause unspecified: Secondary | ICD-10-CM | POA: Diagnosis present

## 2017-05-24 DIAGNOSIS — E1122 Type 2 diabetes mellitus with diabetic chronic kidney disease: Secondary | ICD-10-CM | POA: Diagnosis present

## 2017-05-24 DIAGNOSIS — I499 Cardiac arrhythmia, unspecified: Secondary | ICD-10-CM | POA: Diagnosis not present

## 2017-05-24 DIAGNOSIS — R9431 Abnormal electrocardiogram [ECG] [EKG]: Secondary | ICD-10-CM | POA: Diagnosis not present

## 2017-05-24 DIAGNOSIS — Z452 Encounter for adjustment and management of vascular access device: Secondary | ICD-10-CM | POA: Diagnosis not present

## 2017-05-24 DIAGNOSIS — R4020 Unspecified coma: Secondary | ICD-10-CM | POA: Diagnosis not present

## 2017-05-24 DIAGNOSIS — E860 Dehydration: Secondary | ICD-10-CM | POA: Diagnosis present

## 2017-05-24 DIAGNOSIS — Z87891 Personal history of nicotine dependence: Secondary | ICD-10-CM

## 2017-05-24 DIAGNOSIS — R680 Hypothermia, not associated with low environmental temperature: Secondary | ICD-10-CM | POA: Diagnosis present

## 2017-05-24 DIAGNOSIS — I10 Essential (primary) hypertension: Secondary | ICD-10-CM | POA: Diagnosis not present

## 2017-05-24 DIAGNOSIS — J189 Pneumonia, unspecified organism: Secondary | ICD-10-CM | POA: Diagnosis present

## 2017-05-24 DIAGNOSIS — R001 Bradycardia, unspecified: Principal | ICD-10-CM | POA: Diagnosis present

## 2017-05-24 DIAGNOSIS — J9690 Respiratory failure, unspecified, unspecified whether with hypoxia or hypercapnia: Secondary | ICD-10-CM | POA: Diagnosis not present

## 2017-05-24 DIAGNOSIS — Z86010 Personal history of colonic polyps: Secondary | ICD-10-CM

## 2017-05-24 DIAGNOSIS — R4182 Altered mental status, unspecified: Secondary | ICD-10-CM | POA: Diagnosis not present

## 2017-05-24 DIAGNOSIS — J9601 Acute respiratory failure with hypoxia: Secondary | ICD-10-CM | POA: Diagnosis not present

## 2017-05-24 DIAGNOSIS — M6281 Muscle weakness (generalized): Secondary | ICD-10-CM | POA: Diagnosis not present

## 2017-05-24 DIAGNOSIS — R059 Cough, unspecified: Secondary | ICD-10-CM

## 2017-05-24 DIAGNOSIS — R740 Nonspecific elevation of levels of transaminase and lactic acid dehydrogenase [LDH]: Secondary | ICD-10-CM | POA: Diagnosis present

## 2017-05-24 DIAGNOSIS — Z79899 Other long term (current) drug therapy: Secondary | ICD-10-CM

## 2017-05-24 DIAGNOSIS — I700 Atherosclerosis of aorta: Secondary | ICD-10-CM | POA: Diagnosis present

## 2017-05-24 DIAGNOSIS — I129 Hypertensive chronic kidney disease with stage 1 through stage 4 chronic kidney disease, or unspecified chronic kidney disease: Secondary | ICD-10-CM | POA: Diagnosis present

## 2017-05-24 DIAGNOSIS — E1165 Type 2 diabetes mellitus with hyperglycemia: Secondary | ICD-10-CM | POA: Diagnosis present

## 2017-05-24 DIAGNOSIS — R402441 Other coma, without documented Glasgow coma scale score, or with partial score reported, in the field [EMT or ambulance]: Secondary | ICD-10-CM | POA: Diagnosis not present

## 2017-05-24 DIAGNOSIS — R41841 Cognitive communication deficit: Secondary | ICD-10-CM | POA: Diagnosis not present

## 2017-05-24 DIAGNOSIS — L8990 Pressure ulcer of unspecified site, unspecified stage: Secondary | ICD-10-CM | POA: Diagnosis present

## 2017-05-24 HISTORY — DX: Unspecified osteoarthritis, unspecified site: M19.90

## 2017-05-24 HISTORY — DX: Pneumonia, unspecified organism: J18.9

## 2017-05-24 HISTORY — DX: Other chronic pain: G89.29

## 2017-05-24 HISTORY — DX: Low back pain, unspecified: M54.50

## 2017-05-24 HISTORY — DX: Type 2 diabetes mellitus without complications: E11.9

## 2017-05-24 HISTORY — DX: Low back pain: M54.5

## 2017-05-24 LAB — COMPREHENSIVE METABOLIC PANEL
ALT: 69 U/L — AB (ref 14–54)
AST: 114 U/L — AB (ref 15–41)
Albumin: 3.1 g/dL — ABNORMAL LOW (ref 3.5–5.0)
Alkaline Phosphatase: 54 U/L (ref 38–126)
Anion gap: 14 (ref 5–15)
BILIRUBIN TOTAL: 0.9 mg/dL (ref 0.3–1.2)
BUN: 40 mg/dL — AB (ref 6–20)
CHLORIDE: 101 mmol/L (ref 101–111)
CO2: 22 mmol/L (ref 22–32)
CREATININE: 1.43 mg/dL — AB (ref 0.44–1.00)
Calcium: 8.7 mg/dL — ABNORMAL LOW (ref 8.9–10.3)
GFR calc Af Amer: 34 mL/min — ABNORMAL LOW (ref >60)
GFR, EST NON AFRICAN AMERICAN: 30 mL/min — AB (ref >60)
Glucose, Bld: 303 mg/dL — ABNORMAL HIGH (ref 65–99)
Potassium: 4.2 mmol/L (ref 3.5–5.1)
Sodium: 137 mmol/L (ref 135–145)
Total Protein: 5.8 g/dL — ABNORMAL LOW (ref 6.5–8.1)

## 2017-05-24 LAB — CBG MONITORING, ED
GLUCOSE-CAPILLARY: 199 mg/dL — AB (ref 65–99)
Glucose-Capillary: 217 mg/dL — ABNORMAL HIGH (ref 65–99)

## 2017-05-24 LAB — CBC WITH DIFFERENTIAL/PLATELET
Basophils Absolute: 0 10*3/uL (ref 0.0–0.1)
Basophils Relative: 0 %
Eosinophils Absolute: 0.2 10*3/uL (ref 0.0–0.7)
Eosinophils Relative: 2 %
HCT: 41.3 % (ref 36.0–46.0)
Hemoglobin: 13.2 g/dL (ref 12.0–15.0)
LYMPHS ABS: 1.5 10*3/uL (ref 0.7–4.0)
Lymphocytes Relative: 17 %
MCH: 29.3 pg (ref 26.0–34.0)
MCHC: 32 g/dL (ref 30.0–36.0)
MCV: 91.8 fL (ref 78.0–100.0)
Monocytes Absolute: 0.6 10*3/uL (ref 0.1–1.0)
Monocytes Relative: 7 %
Neutro Abs: 6.5 10*3/uL (ref 1.7–7.7)
Neutrophils Relative %: 74 %
PLATELETS: 148 10*3/uL — AB (ref 150–400)
RBC: 4.5 MIL/uL (ref 3.87–5.11)
RDW: 15.5 % (ref 11.5–15.5)
WBC: 8.8 10*3/uL (ref 4.0–10.5)

## 2017-05-24 LAB — AMMONIA: AMMONIA: 31 umol/L (ref 9–35)

## 2017-05-24 LAB — I-STAT ARTERIAL BLOOD GAS, ED
ACID-BASE DEFICIT: 1 mmol/L (ref 0.0–2.0)
BICARBONATE: 26.7 mmol/L (ref 20.0–28.0)
O2 SAT: 100 %
TCO2: 28 mmol/L (ref 22–32)
pCO2 arterial: 53.2 mmHg — ABNORMAL HIGH (ref 32.0–48.0)
pH, Arterial: 7.304 — ABNORMAL LOW (ref 7.350–7.450)
pO2, Arterial: 313 mmHg — ABNORMAL HIGH (ref 83.0–108.0)

## 2017-05-24 LAB — I-STAT CG4 LACTIC ACID, ED: Lactic Acid, Venous: 6.1 mmol/L (ref 0.5–1.9)

## 2017-05-24 LAB — MAGNESIUM: MAGNESIUM: 1.9 mg/dL (ref 1.7–2.4)

## 2017-05-24 LAB — TRIGLYCERIDES: Triglycerides: 64 mg/dL (ref <150)

## 2017-05-24 MED ORDER — FENTANYL 2500MCG IN NS 250ML (10MCG/ML) PREMIX INFUSION
25.0000 ug/h | INTRAVENOUS | Status: DC
  Administered 2017-05-24: 50 ug/h via INTRAVENOUS
  Filled 2017-05-24: qty 250

## 2017-05-24 MED ORDER — PROPOFOL 1000 MG/100ML IV EMUL
INTRAVENOUS | Status: AC
  Filled 2017-05-24: qty 100

## 2017-05-24 MED ORDER — HEPARIN SODIUM (PORCINE) 5000 UNIT/ML IJ SOLN
5000.0000 [IU] | Freq: Three times a day (TID) | INTRAMUSCULAR | Status: DC
  Administered 2017-05-25 – 2017-05-29 (×14): 5000 [IU] via SUBCUTANEOUS
  Filled 2017-05-24 (×15): qty 1

## 2017-05-24 MED ORDER — FENTANYL BOLUS VIA INFUSION
25.0000 ug | INTRAVENOUS | Status: DC | PRN
  Filled 2017-05-24: qty 25

## 2017-05-24 MED ORDER — PANTOPRAZOLE SODIUM 40 MG IV SOLR: 40.0000 mg | Freq: Every day | INTRAVENOUS | Status: DC

## 2017-05-24 MED ORDER — ETOMIDATE 2 MG/ML IV SOLN
15.0000 mg | Freq: Once | INTRAVENOUS | Status: AC
  Administered 2017-05-24: 15 mg via INTRAVENOUS

## 2017-05-24 MED ORDER — SODIUM CHLORIDE 0.9 % IV SOLN
INTRAVENOUS | Status: DC
  Administered 2017-05-25 – 2017-05-26 (×2): via INTRAVENOUS

## 2017-05-24 MED ORDER — ROCURONIUM BROMIDE 50 MG/5ML IV SOLN
70.0000 mg | Freq: Once | INTRAVENOUS | Status: AC
  Administered 2017-05-24: 70 mg via INTRAVENOUS

## 2017-05-24 MED ORDER — SODIUM CHLORIDE 0.9 % IV SOLN: 250.0000 mL | INTRAVENOUS | Status: DC | PRN

## 2017-05-24 MED ORDER — SODIUM CHLORIDE 0.9 % IV BOLUS (SEPSIS)
1000.0000 mL | Freq: Once | INTRAVENOUS | Status: AC
  Administered 2017-05-24: 1000 mL via INTRAVENOUS

## 2017-05-24 MED ORDER — PROPOFOL 1000 MG/100ML IV EMUL: 0.0000 ug/kg/min | INTRAVENOUS | Status: DC

## 2017-05-24 MED ORDER — SODIUM CHLORIDE 0.9 % IV BOLUS (SEPSIS)
1000.0000 mL | Freq: Once | INTRAVENOUS | Status: AC
Start: 2017-05-24 — End: 2017-05-24
  Administered 2017-05-24: 1000 mL via INTRAVENOUS

## 2017-05-24 MED ORDER — GLUCAGON HCL RDNA (DIAGNOSTIC) 1 MG IJ SOLR
5.0000 mg | Freq: Once | INTRAVENOUS | Status: AC
  Administered 2017-05-24: 5 mg via INTRAVENOUS
  Filled 2017-05-24: qty 5

## 2017-05-24 MED ORDER — INSULIN ASPART 100 UNIT/ML ~~LOC~~ SOLN
2.0000 [IU] | SUBCUTANEOUS | Status: DC
  Administered 2017-05-24 – 2017-05-25 (×2): 4 [IU] via SUBCUTANEOUS
  Filled 2017-05-24: qty 1

## 2017-05-24 MED ORDER — MAGNESIUM SULFATE 2 GM/50ML IV SOLN
2.0000 g | Freq: Once | INTRAVENOUS | Status: AC
  Administered 2017-05-24: 2 g via INTRAVENOUS
  Filled 2017-05-24: qty 50

## 2017-05-24 NOTE — Consult Note (Signed)
Cardiology Consultation:   Patient ID: Audrey Reed [id number]; 12-20-1917   Admit date: 05/24/2017 Date of Consult: 05/24/2017  Primary Care Provider: Janith Lima, MD Primary Cardiologist: None Primary Electrophysiologist:  none   Patient Profile:   Audrey Reed is a 83 y.o. female with a hx of adenoCA of the breast, dementia, HTN who is being seen today for the evaluation of profound bradycardia with hemodynamic instability at the request of Dr. Dorie Rank.  History of Present Illness:   Audrey Reed is a 83yo WF with a history of adeno CA of the breast, HTN, dementia and carries a dx in her PMH of CAD although I cannot find any documentation of this.  According to her son, she lives alone and is able to care for herself but most of her meals are brought in and she does not do much housekeeping.  She was found down at home disoriented and confused and EMS was called.  Upon their arrival her HR was 15 bpm and pacer pads were placed with external pacing applied with capture.  On arrival in the ER she was noted to be responsive but then suddenly had agonal breathing and loss of capture and was emergently intubated and sedated.  Currently she is in NSR with hypertensive BP.  The Zol is currently off.  Cardiology is asked to consult for possible tem pacing wire. Of note she did not receive any Atropine or Epi.    Past Medical History:  Diagnosis Date  . Adenocarcinoma of breast ([place])   . Anxiety disorder   . CAD (coronary artery disease)   . Clostridium difficile infection 01/21/2015  . Colon polyp 06/26/03   9 mm sessile polyp 110 cm from anus; not retrieved  . Dementia   . Dermatophytosis of nail 03/31/2013  . Diverticulosis of colon   . DJD (degenerative joint disease)   . DM (diabetes mellitus) ([place])   . HTN (hypertension)   . Hypercholesterolemia   . Osteoporosis   . Venous insufficiency     Past Surgical History:  Procedure Laterality Date  . CATARACT EXTRACTION    .  MASTECTOMY     for breast cancer     Home Medications:  Prior to Admission medications   Medication Sig Start Date End Date Taking? Authorizing Provider  acetaminophen (TYLENOL) 325 MG tablet Take 2 tablets (650 mg total) by mouth every 6 (six) hours as needed (or Fever >/= 101). 08/15/12  Yes Black, Lezlie Octave, NP  carvedilol (COREG) 12.5 MG tablet Take 1 tablet (12.5 mg total) by mouth 2 (two) times daily with a meal. 04/03/17  Yes Janith Lima, MD  Cholecalciferol (VITAMIN D3) 1000 units CAPS Take 2 capsules (2,000 Units total) by mouth daily. 10/13/15  Yes Janith Lima, MD  feeding supplement (ENSURE) PUDG Take 1 Container by mouth 3 (three) times daily between meals. 08/15/12  Yes Black, Lezlie Octave, NP  ferrous sulfate 325 (65 FE) MG tablet take 1 tablet by mouth twice a day with meals 11/12/16  Yes Janith Lima, MD  fluticasone [facility]) 50 MCG/ACT nasal spray Place 2 sprays into both nostrils daily. 04/28/14  Yes Janith Lima, MD  furosemide (LASIX) 40 MG tablet TAKE 1 TABLET BY MOUTH EVERY MORNING 01/02/17  Yes Janith Lima, MD  Multiple Vitamins-Minerals (CERTAVITE SENIOR/ANTIOXIDANT) TABS Take by mouth.   Yes [provider]  saccharomyces boulardii (FLORASTOR) 250 MG capsule Take 1 capsule (250 mg total) by mouth daily.  10/28/12  Yes Noralee Space, MD  simvastatin (ZOCOR) 20 MG tablet Take 1 tablet (20 mg total) by mouth at bedtime. 04/02/17  Yes Janith Lima, MD    Inpatient Medications: Scheduled Meds: . heparin  5,000 Units Subcutaneous Q8H  . insulin aspart  2-6 Units Subcutaneous Q4H  . pantoprazole (PROTONIX) IV  40 mg Intravenous QHS   Continuous Infusions: . sodium chloride    . sodium chloride    . fentaNYL infusion INTRAVENOUS 50 mcg/hr (05/24/17 1932)  . glucagon (GLUCAGEN)  IVPB (bolus for beta blocker/calcium channel bocker overdose)    . magnesium sulfate 1 - 4 g bolus IVPB    . sodium chloride 1,000 mL (05/24/17 2245)   And  . sodium chloride 1,000  mL (05/24/17 2251)   PRN Meds: sodium chloride, fentaNYL  Allergies:    Allergies  Allergen Reactions  . Metformin And Related     diarrhea  . Nitrofurantoin     REACTION: unsure of reaction--happened long ago  . Tramadol Nausea Only  . Codeine Nausea And Vomiting, Swelling and Rash    Social History:   Social History   Social History  . Marital status: Widowed    Spouse name: N/A  . Number of children: 1  . Years of education: N/A   Occupational History  . retired from [employer] after 30+ years    Social History Main Topics  . Smoking status: Former Smoker    Quit date: 08/08/1987  . Smokeless tobacco: Never Used  . Alcohol use No  . Drug use: No  . Sexual activity: Not Currently   Other Topics Concern  . Not on file   Social History Narrative   Husband died 20+ years ago   2 sons - one died age 71 w/ kidney cancer   1 son alive, Audrey Reed - he is next of kin   Caffeine use: 2-3 cups per day   4 siblings, all sisters - 2 sisters deceased   1 sister is our patient = Audrey Reed    Family History:    Family History  Problem Relation Age of Onset  . Heart disease Mother   . Heart disease Father   . Alzheimer's disease Sister   . Alzheimer's disease Sister   . Heart attack Sister   . Heart attack Sister      ROS:  Please see the history of present illness.  ROS  All other ROS reviewed and negative.     Physical Exam/Data:   Vitals:   05/24/17 2130 05/24/17 2145 05/24/17 2200 05/24/17 2215  BP: (!) 167/81 (!) 182/67 (!) 186/71 (!) 169/78  Pulse: (!) 46 (!) 49 (!) 51 (!) 50  Resp: 20 20 (!) 21 20  Temp:      TempSrc:      SpO2: 100% 100% 100% 100%  Weight:      Height:       No intake or output data in the 24 hours ending 05/24/17 2308 Filed Weights   05/24/17 1919  Weight: 132 lb 0.9 oz (59.9 kg)   Body mass index is 24.15 kg/m.  General:  Well nourished, well developed, in no acute distress HEENT: normal Lymph: no adenopathy Neck: no  JVD Endocrine:  No thryomegaly Vascular: No carotid bruits; FA pulses 2+ bilaterally without bruits  Cardiac:  normal S1, S2; RRR; no murmur  Lungs:  clear to auscultation bilaterally, no wheezing, rhonchi or rales  Abd: soft, nontender, no hepatomegaly  Ext: no  edema Musculoskeletal:  No deformities, BUE and BLE strength normal and equal Skin: warm and dry  Neuro:  CNs 2-12 intact, no focal abnormalities noted Psych:  Normal affect   EKG:  The EKG was personally reviewed and demonstrates:  NSR with LPFB and RBBB and ST depression in the inferolateral leads Telemetry:  Telemetry was personally reviewed and demonstrates:  NSR  Relevant CV Studies: none  Laboratory Data:  Chemistry Recent Labs Lab 05/24/17 1945  NA 137  K 4.2  CL 101  CO2 22  GLUCOSE 303*  BUN 40*  CREATININE 1.43*  CALCIUM 8.7*  GFRNONAA 30*  GFRAA 34*  ANIONGAP 14     Recent Labs Lab 05/24/17 1945  PROT 5.8*  ALBUMIN 3.1*  AST 114*  ALT 69*  ALKPHOS 54  BILITOT 0.9   Hematology Recent Labs Lab 05/24/17 1945  WBC 8.8  RBC 4.50  HGB 13.2  HCT 41.3  MCV 91.8  MCH 29.3  MCHC 32.0  RDW 15.5  PLT 148*   Cardiac EnzymesNo results for input(s): TROPONINI in the last 168 hours. No results for input(s): TROPIPOC in the last 168 hours.  BNPNo results for input(s): BNP, PROBNP in the last 168 hours.  DDimer No results for input(s): DDIMER in the last 168 hours.  Radiology/Studies:  Ct Head Wo Contrast  Result Date: 05/24/2017 CLINICAL DATA:  Altered level of consciousness. EXAM: CT HEAD WITHOUT CONTRAST TECHNIQUE: Contiguous axial images were obtained from the base of the skull through the vertex without intravenous contrast. COMPARISON:  11/03/2012 FINDINGS: Brain: No evidence of acute infarction, hemorrhage, hydrocephalus, extra-axial collection, or mass lesion/mass effect. Stable mild to moderate cerebral atrophy and chronic small vessel disease. Vascular:  No hyperdense vessel or other  acute findings. Skull: No evidence of fracture or other significant bone abnormality. Sinuses/Orbits:  No acute findings. Other: None. IMPRESSION: No acute intracranial abnormality. Cerebral atrophy and chronic small vessel disease. Electronically Signed   By: Earle Gell M.D.   On: 05/24/2017 20:25   Dg Chest Portable 1 View  Result Date: 05/24/2017 CLINICAL DATA:  Post intubation EXAM: PORTABLE CHEST 1 VIEW COMPARISON:  03/21/2014 FINDINGS: Endotracheal tube tip is about 1.7 cm superior to the carina. Esophageal tube tip is below the diaphragm. Diffuse coarse interstitial opacity likely chronic. No consolidation or effusion. Stable cardiomediastinal silhouette with atherosclerosis. No pneumothorax. IMPRESSION: Endotracheal tube tip about 1.7 cm superior to carina Probable coarse chronic interstitial opacity. No edema or consolidation. Electronically Signed   By: Donavan Foil M.D.   On: 05/24/2017 20:08    Assessment and Plan:   1. Profound bradycardia with HR 15 bpm - I do not have any telemetry strips at this time to document the bradyarrhythmias.  The ER staff are contacting EMS to get the strips so we can review them.  This is a very frail, elderly female who unfortunately does not had a living will and has only 1 son.  He is struggling with the decision of how to move forward with her care.  Initially he wanted everything done but we had a long discussion and he understands that she is critically ill and likely will not recover.    At this time he wishes to continue current care and will talk with CCM who will be admitting the patient.  I have recommended that we not proceed with any invasive procedures at this time such as temporary pacing especially in light of her HR in the 80's currently and stable HR as  well as debilated state and advanced age. She is on Carvedilol at home which will need time to wash out.   He wants time to think about her code status.    For questions or updates, please  contact [place] Please consult [email or web address] for contact info under Cardiology/STEMI.   Signed, Fransico Him, MD  05/24/2017 11:08 PM

## 2017-05-24 NOTE — H&P (Signed)
PULMONARY / CRITICAL CARE MEDICINE   Name: Audrey Reed MRN: [id number] DOB: [birth year]    ADMISSION DATE:  05/24/2017 CONSULTATION DATE:  05/24/2017  REFERRING MD:  Dr. Rene Kocher  CHIEF COMPLAINT:  Cardiac Arrest   HISTORY OF PRESENT ILLNESS:   83 year old female with PMH of Adenocarcinoma of breast, CAD, Dementia, DM, HTN   Presents to ED after being found down at home disoriented and confused. When EMS arrived HR 15, placed on pacer pads. Intubated upon arrival to ED. LA 6.10, Crt 1.43, AST/ALT 114/69, Temp 96.4. CXR and Head CT with no acute findings. PCCM asked to admit.    PAST MEDICAL HISTORY :  She  has a past medical history of Adenocarcinoma of breast ([place]); Anxiety disorder; CAD (coronary artery disease); Clostridium difficile infection (01/21/2015); Colon polyp (06/26/03); Dementia; Dermatophytosis of nail (03/31/2013); Diverticulosis of colon; DJD (degenerative joint disease); DM (diabetes mellitus) ([place]); HTN (hypertension); Hypercholesterolemia; Osteoporosis; and Venous insufficiency.  PAST SURGICAL HISTORY: She  has a past surgical history that includes Cataract extraction and Mastectomy.  Allergies  Allergen Reactions  . Metformin And Related     diarrhea  . Nitrofurantoin     REACTION: unsure of reaction--happened long ago  . Tramadol Nausea Only  . Codeine Nausea And Vomiting, Swelling and Rash    No current facility-administered medications on file prior to encounter.    Current Outpatient Prescriptions on File Prior to Encounter  Medication Sig  . acetaminophen (TYLENOL) 325 MG tablet Take 2 tablets (650 mg total) by mouth every 6 (six) hours as needed (or Fever >/= 101).  . carvedilol (COREG) 12.5 MG tablet Take 1 tablet (12.5 mg total) by mouth 2 (two) times daily with a meal.  . Cholecalciferol (VITAMIN D3) 1000 units CAPS Take 2 capsules (2,000 Units total) by mouth daily.  . feeding supplement (ENSURE) PUDG Take 1 Container by mouth 3 (three) times daily  between meals.  . ferrous sulfate 325 (65 FE) MG tablet take 1 tablet by mouth twice a day with meals  . fluticasone (FLONASE) 50 MCG/ACT nasal spray Place 2 sprays into both nostrils daily.  . furosemide (LASIX) 40 MG tablet TAKE 1 TABLET BY MOUTH EVERY MORNING  . Multiple Vitamins-Minerals (CERTAVITE SENIOR/ANTIOXIDANT) TABS Take by mouth.  . saccharomyces boulardii (FLORASTOR) 250 MG capsule Take 1 capsule (250 mg total) by mouth daily.  . simvastatin (ZOCOR) 20 MG tablet Take 1 tablet (20 mg total) by mouth at bedtime.    FAMILY HISTORY:  Her indicated that her mother is deceased. She indicated that her father is deceased. She indicated that two of her six sisters are deceased.    SOCIAL HISTORY: She  reports that she quit smoking about 29 years ago. She has never used smokeless tobacco. She reports that she does not drink alcohol or use drugs.  REVIEW OF SYSTEMS:   Unable to review as patient is intubated and sedated   SUBJECTIVE:   VITAL SIGNS: BP (!) 197/95   Pulse 72   Temp (!) 96.4 F (35.8 C) (Temporal)   Resp 14   Ht 5\' 2"  (1.575 m)   Wt 59.9 kg (132 lb 0.9 oz)   SpO2 100%   BMI 24.15 kg/m   HEMODYNAMICS:    VENTILATOR SETTINGS: Vent Mode: PRVC FiO2 (%):  [50 %-100 %] 50 % Set Rate:  [14 bmp-20 bmp] 20 bmp Vt Set:  [400 mL] 400 mL PEEP:  [5 cmH20] 5 cmH20 Plateau Pressure:  [13 cmH20-14 cmH20] 14  cmH20  INTAKE / OUTPUT: No intake/output data recorded.  PHYSICAL EXAMINATION: General:  Elderly female, intubated  Neuro:  Non-responsive to physical stimulation, Pupils sluggish HEENT:  ETT in place  Cardiovascular:  Loletha Grayer, no MRG  Lungs:  Clear breath sounds, no wheeze/crackles  Abdomen:  Non-distended, active bowel sounds  Musculoskeletal:  -edema  Skin:  Warm, dry, intact   LABS:  BMET  Recent Labs Lab 05/24/17 1945  NA 137  K 4.2  CL 101  CO2 22  BUN 40*  CREATININE 1.43*  GLUCOSE 303*    Electrolytes  Recent Labs Lab 05/24/17 1945   CALCIUM 8.7*  MG 1.9    CBC  Recent Labs Lab 05/24/17 1945  WBC 8.8  HGB 13.2  HCT 41.3  PLT 148*    Coag's No results for input(s): APTT, INR in the last 168 hours.  Sepsis Markers  Recent Labs Lab 05/24/17 1951  LATICACIDVEN 6.10*    ABG  Recent Labs Lab 05/24/17 2053  PHART 7.304*  PCO2ART 53.2*  PO2ART 313.0*    Liver Enzymes  Recent Labs Lab 05/24/17 1945  AST 114*  ALT 69*  ALKPHOS 54  BILITOT 0.9  ALBUMIN 3.1*    Cardiac Enzymes No results for input(s): TROPONINI, PROBNP in the last 168 hours.  Glucose  Recent Labs Lab 05/24/17 2104  GLUCAP 217*    Imaging Ct Head Wo Contrast  Result Date: 05/24/2017 CLINICAL DATA:  Altered level of consciousness. EXAM: CT HEAD WITHOUT CONTRAST TECHNIQUE: Contiguous axial images were obtained from the base of the skull through the vertex without intravenous contrast. COMPARISON:  11/03/2012 FINDINGS: Brain: No evidence of acute infarction, hemorrhage, hydrocephalus, extra-axial collection, or mass lesion/mass effect. Stable mild to moderate cerebral atrophy and chronic small vessel disease. Vascular:  No hyperdense vessel or other acute findings. Skull: No evidence of fracture or other significant bone abnormality. Sinuses/Orbits:  No acute findings. Other: None. IMPRESSION: No acute intracranial abnormality. Cerebral atrophy and chronic small vessel disease. Electronically Signed   By: Earle Gell M.D.   On: 05/24/2017 20:25   Dg Chest Portable 1 View  Result Date: 05/24/2017 CLINICAL DATA:  Post intubation EXAM: PORTABLE CHEST 1 VIEW COMPARISON:  03/21/2014 FINDINGS: Endotracheal tube tip is about 1.7 cm superior to the carina. Esophageal tube tip is below the diaphragm. Diffuse coarse interstitial opacity likely chronic. No consolidation or effusion. Stable cardiomediastinal silhouette with atherosclerosis. No pneumothorax. IMPRESSION: Endotracheal tube tip about 1.7 cm superior to carina Probable coarse  chronic interstitial opacity. No edema or consolidation. Electronically Signed   By: Donavan Foil M.D.   On: 05/24/2017 20:08     STUDIES:  CXR 10/18 > Endotracheal tube tip about 1.7 cm superior to carina Probable coarse chronic interstitial opacity. No edema or Consolidation. CT Head 10/18 > No acute intracranial abnormality. Cerebral atrophy and chronic small vessel disease.  CULTURES: Blood 10/18 >> Sputum 10/18 >> U/A 10/18 >>   ANTIBIOTICS: None   SIGNIFICANT EVENTS: 10/18 > Presents to ED after being found down   LINES/TUBES: ETT 10/18 >>   DISCUSSION: 83 year old female found down at home, altered. Presents to ED bradycardic and unresponsive requiring intubation   ASSESSMENT / PLAN:  PULMONARY A: Respiratory Insufficiency in setting of Encephalopathy   P:   Vent Support Trend ABG/CXR VAP bundle   CARDIOVASCULAR A:  Bradycardia  H/O HTN, HLD   P:  Cardiac Monitoring  ECHO Pending  Cardiology Following  Hold home coreg, lasix, zocor  RENAL A:   Non-Gap Metabolic Acidosis  -LA 6.06 >>  Acute Renal Failure  P:   Trend BMP Replace electrolytes as indicated  NS @ 6ml/hr  Give 30cc/kg NS   GASTROINTESTINAL A:   Elevated Transaminases  P:   NPO PPI Trend LFT   HEMATOLOGIC A:   H/O Malignant Neoplasm of Breast > no evidence of recurrence  P:  Trend CBC  Heparin SQ for VTE  SCDs   INFECTIOUS A:   Hypothermia > Found down  -No source of infection, CXR clear, U/A pending  P:   Trend WBC and Fever Curve Follow Culture Data  Trend WBC and Fever Curve  Trend PCT and LA  Monitor off antibiotics > low thrush hold to start   ENDOCRINE A:   Hyperglycemia  H/O DM    P:   Trend Glucose  SSI   NEUROLOGIC A:   Metabolic Encephalopathy  H/O Dementia  -CT Head Negative  P:   RASS goal: 0 Wean Fentanyl gtt to achieve RASS   FAMILY  - Updates: Updated at bedside, Goals of care with son > DNR status, will withdrawal in AM if no  improvement   - Inter-disciplinary family meet or Palliative Care meeting due by: 05/31/2017   CC Time: 74 minutes  Hayden Pedro, AGACNP-BC [facility]  Pgr: [phone]  PCCM Pgr: [phone]  STAFF NOTE  I, Dr Seward Carol have personally reviewed patient's available data, including medical history, events of note, physical examination and test results as part of my evaluation. I have discussed with resident/NP and other care providers such as pharmacist, RN and RRT.  In addition,  I personally evaluated patient  83 year old female with PMHx Adenocarcinoma of breast, CAD, Dementia, DM, HTN presented after being found down severely bradycardic PCCM consulted. On my exam patient is on sedation with propofol and fentanyl ggt intubated with decreased breath sounds bilaterally, bradycardic to 50s on exam with equal pulses in the radials, soft belly hypoactive BS. CTH shows cerebral atrophy but no acute CVA.  Multiorgan dysfunction (intubated requiring mechanical vent support, LA 6, GFR 34, Bradycardic) Family at bedside. Son is next of kin I spoke to him regarding the critical nature of Ms Lindeman and her prognosis.  He understands her clinical condition, was interactive and asked questions. The family has reasonable expectations after processing information regarding her current clinical condition. He does not want her to be in pain or to suffer. He also does not want invasive procedures including central lines. Pt is now DNR and if our interventions overnight do not result in clinical improvement the family has decided to withdraw care in AM.   Rest per NP whose note is outlined above and that I agree with  The patient is critically ill with multiple organ systems failure and requires high complexity decision making for assessment and support, frequent evaluation and titration of therapies, application of advanced monitoring technologies and extensive interpretation  of multiple databases.  Critical Care Time devoted to patient care services described in this note is 13 Minutes. This time reflects time of care of this signee Dr Seward Carol. This critical care time does not reflect procedure time, or teaching time or supervisory time of NP etc but could involve care discussion time   CODE STATUS: DNR CC TIME: 55 mins DISPOSITION: ICU FAMILY: SON- has reasonable expectations   Dr. Seward Carol Pulmonary Critical Care Medicine Locums  05/24/2017 9:47 PM

## 2017-05-24 NOTE — ED Triage Notes (Signed)
Patient arrived with EMS from home found disoriented/confused on the living room floor this evening , HR=15/min, she received Zofran 4 mg for nausea . Paced prior to arrival at 60 min .

## 2017-05-24 NOTE — ED Notes (Signed)
Pacing discontinued per Dr. Radford Pax , NSR on the monitor at 72/min . Fentanyl drip infusing . IV sites intact .

## 2017-05-24 NOTE — Progress Notes (Signed)
Given Bradycardia and home coreg will give glucagon 5 mg x 1.

## 2017-05-24 NOTE — ED Notes (Signed)
Dr. Radford Pax ( cardiologist ) at bedside speaking with son on plan of care to pt.

## 2017-05-24 NOTE — Progress Notes (Signed)
Results for Audrey Reed, Audrey Reed (MRN [id number]) as of 05/24/2017 20:59  Ref. Range 05/24/2017 20:53  Sample type Unknown ARTERIAL  pH, Arterial Latest Ref Range: 7.350 - 7.450  7.304 (L)  pCO2 arterial Latest Ref Range: 32.0 - 48.0 mmHg 53.2 (H)  pO2, Arterial Latest Ref Range: 83.0 - 108.0 mmHg 313.0 (H)  TCO2 Latest Ref Range: 22 - 32 mmol/L 28  Acid-base deficit Latest Ref Range: 0.0 - 2.0 mmol/L 1.0  Bicarbonate Latest Ref Range: 20.0 - 28.0 mmol/L 26.7  O2 Saturation Latest Units: % 100.0  Patient temperature Unknown 96.7 F  Collection site Unknown RADIAL, ALLEN'S T...    RT increased rate from 14 to 20 RPM And decreased FiO2 from 70 to 50 per the above ABG results.

## 2017-05-24 NOTE — ED Provider Notes (Signed)
[facility] 62[facility] MEDICAL ICU Provider Note   CSN: [id number] Arrival date & time: 05/24/17  1913    History   Chief Complaint Chief Complaint  Patient presents with  . Bradycardia    HR=15/min    HPI Audrey Reed is a 83 y.o. female.  83 year old female history of diabetes, dementia, breast cancer, HTN, osteoporosis who presents via EMS with active cardiac pacing after found down and noted to be bradycardic with HR15.  History provided by patient's son and EMS due to patient's somnolence.  Level 5 caveat applies.  Patient normally lives alone.  Son checks on patient twice daily.  Patient was last seen normal at 9 AM by son.  Son found patient on the ground near her bathroom mumbling incomprehensively around 5-6pm.  EMS noted patient's heart rate to be 15.  Cardiac strip showing complete heart block with periods of asystole.  Patient arrived with active external cardiac pacer with HR 70.  Decision made for emergent intubation due to somnolence and for airway protection.  The history is provided by the EMS personnel. No language interpreter was used.    Past Medical History:  Diagnosis Date  . Adenocarcinoma of breast ([place])   . Anxiety disorder   . CAD (coronary artery disease)   . Clostridium difficile infection 01/21/2015  . Colon polyp 06/26/03   9 mm sessile polyp 110 cm from anus; not retrieved  . Dementia   . Dermatophytosis of nail 03/31/2013  . Diverticulosis of colon   . DJD (degenerative joint disease)   . DM (diabetes mellitus) ([place])   . HTN (hypertension)   . Hypercholesterolemia   . Osteoporosis   . Venous insufficiency     Patient Active Problem List   Diagnosis Date Noted  . Cardiac arrest ([place]) 05/24/2017  . Bradycardia 05/24/2017  . Ventilator dependent ([place])   . Bradycardia, sinus, persistent, severe   . Multiple renal cysts 10/28/2015  . Leg ulcer ([place]) 10/13/2015  . Type II diabetes mellitus with complication ([place]) 08/09/7251  .  Routine general medical examination at a health care facility 12/23/2013  . Renal insufficiency 12/26/2012  . Dementia 08/04/2012  . Breast cancer ([place]) 10/22/2007  . Coronary atherosclerosis 10/22/2007  . Essential hypertension 05/28/2007  . DEGENERATIVE JOINT DISEASE, GENERALIZED 05/28/2007  . OSTEOPOROSIS 05/28/2007    Past Surgical History:  Procedure Laterality Date  . CATARACT EXTRACTION    . MASTECTOMY     for breast cancer    OB History    No data available       Home Medications    Prior to Admission medications   Medication Sig Start Date End Date Taking? Authorizing Provider  acetaminophen (TYLENOL) 325 MG tablet Take 2 tablets (650 mg total) by mouth every 6 (six) hours as needed (or Fever >/= 101). 08/15/12  Yes Black, Lezlie Octave, NP  carvedilol (COREG) 12.5 MG tablet Take 1 tablet (12.5 mg total) by mouth 2 (two) times daily with a meal. 04/03/17  Yes Janith Lima, MD  Cholecalciferol (VITAMIN D3) 1000 units CAPS Take 2 capsules (2,000 Units total) by mouth daily. 10/13/15  Yes Janith Lima, MD  feeding supplement (ENSURE) PUDG Take 1 Container by mouth 3 (three) times daily between meals. 08/15/12  Yes Black, Lezlie Octave, NP  ferrous sulfate 325 (65 FE) MG tablet take 1 tablet by mouth twice a day with meals 11/12/16  Yes Janith Lima, MD  fluticasone [facility]) 50 MCG/ACT nasal spray Place  2 sprays into both nostrils daily. 04/28/14  Yes Janith Lima, MD  furosemide (LASIX) 40 MG tablet TAKE 1 TABLET BY MOUTH EVERY MORNING 01/02/17  Yes Janith Lima, MD  Multiple Vitamins-Minerals (CERTAVITE SENIOR/ANTIOXIDANT) TABS Take by mouth.   Yes [provider]  saccharomyces boulardii (FLORASTOR) 250 MG capsule Take 1 capsule (250 mg total) by mouth daily. 10/28/12  Yes Noralee Space, MD  simvastatin (ZOCOR) 20 MG tablet Take 1 tablet (20 mg total) by mouth at bedtime. 04/02/17  Yes Janith Lima, MD    Family History Family History  Problem Relation Age of  Onset  . Heart disease Mother   . Heart disease Father   . Alzheimer's disease Sister   . Alzheimer's disease Sister   . Heart attack Sister   . Heart attack Sister     Social History Social History  Substance Use Topics  . Smoking status: Former Smoker    Quit date: 08/08/1987  . Smokeless tobacco: Never Used  . Alcohol use No     Allergies   Metformin and related; Nitrofurantoin; Tramadol; and Codeine   Review of Systems Review of Systems  Unable to perform ROS: Unstable vital signs     Physical Exam Updated Vital Signs BP (!) 187/97 (BP Location: Right Arm)   Pulse 64   Temp (!) 97.5 F (36.4 C) (Oral)   Resp (!) 21   Ht 5\' 2"  (1.575 m)   Wt 60.1 kg (132 lb 7.9 oz)   SpO2 100%   BMI 24.23 kg/m   Physical Exam  Constitutional: She appears well-developed. She appears listless. She has a sickly appearance. She appears ill.  HENT:  Head: Normocephalic and atraumatic.  Neck: Neck supple.  Cardiovascular:  Cardiac pads by EMS actively pacing patient  Pulmonary/Chest: Effort normal. No respiratory distress. She has no wheezes.  Abdominal: Soft. She exhibits no distension. There is no tenderness.  Musculoskeletal: She exhibits no edema.  Neurological: She appears listless.  Answers name. Appears somnolent. Groans due to cardiac pacing  Nursing note and vitals reviewed.    ED Treatments / Results  Labs (all labs ordered are listed, but only abnormal results are displayed) Labs Reviewed  COMPREHENSIVE METABOLIC PANEL - Abnormal; Notable for the following:       Result Value   Glucose, Bld 303 (*)    BUN 40 (*)    Creatinine, Ser 1.43 (*)    Calcium 8.7 (*)    Total Protein 5.8 (*)    Albumin 3.1 (*)    AST 114 (*)    ALT 69 (*)    GFR calc non Af Amer 30 (*)    GFR calc Af Amer 34 (*)    All other components within normal limits  CBC WITH DIFFERENTIAL/PLATELET - Abnormal; Notable for the following:    Platelets 148 (*)    All other components within  normal limits  URINALYSIS, COMPLETE (UACMP) WITH MICROSCOPIC - Abnormal; Notable for the following:    Color, Urine STRAW (*)    Glucose, UA 150 (*)    Hgb urine dipstick SMALL (*)    Nitrite POSITIVE (*)    Bacteria, UA MANY (*)    All other components within normal limits  GLUCOSE, CAPILLARY - Abnormal; Notable for the following:    Glucose-Capillary 198 (*)    All other components within normal limits  I-STAT ARTERIAL BLOOD GAS, ED - Abnormal; Notable for the following:    pH, Arterial 7.304 (*)  pCO2 arterial 53.2 (*)    pO2, Arterial 313.0 (*)    All other components within normal limits  CBG MONITORING, ED - Abnormal; Notable for the following:    Glucose-Capillary 217 (*)    All other components within normal limits  I-STAT CG4 LACTIC ACID, ED - Abnormal; Notable for the following:    Lactic Acid, Venous 6.10 (*)    All other components within normal limits  CBG MONITORING, ED - Abnormal; Notable for the following:    Glucose-Capillary 199 (*)    All other components within normal limits  CULTURE, BLOOD (ROUTINE X 2)  CULTURE, BLOOD (ROUTINE X 2)  CULTURE, RESPIRATORY (NON-EXPECTORATED)  MRSA PCR SCREENING  TRIGLYCERIDES  AMMONIA  MAGNESIUM  RAPID URINE DRUG SCREEN, HOSP PERFORMED  BASIC METABOLIC PANEL  CBC  MAGNESIUM  PHOSPHORUS  PROCALCITONIN  PROCALCITONIN  HEPATIC FUNCTION PANEL  LACTIC ACID, PLASMA  LACTIC ACID, PLASMA    EKG  EKG Interpretation  Date/Time:  Thursday May 24 2017 19:35:39 EDT Ventricular Rate:  72 PR Interval:    QRS Duration: 158 QT Interval:  470 QTC Calculation: 515 R Axis:   113 Text Interpretation:  Sinus rhythm Prolonged PR interval RBBB and LPFB Abnormal T, consider ischemia, inferior leads atrial fibrillation not present on ECG external pacing   Confirmed by Dorie Rank [phone]) on 05/24/2017 7:44:06 PM       Radiology Ct Head Wo Contrast  Result Date: 05/24/2017 CLINICAL DATA:  Altered level of consciousness.  EXAM: CT HEAD WITHOUT CONTRAST TECHNIQUE: Contiguous axial images were obtained from the base of the skull through the vertex without intravenous contrast. COMPARISON:  11/03/2012 FINDINGS: Brain: No evidence of acute infarction, hemorrhage, hydrocephalus, extra-axial collection, or mass lesion/mass effect. Stable mild to moderate cerebral atrophy and chronic small vessel disease. Vascular:  No hyperdense vessel or other acute findings. Skull: No evidence of fracture or other significant bone abnormality. Sinuses/Orbits:  No acute findings. Other: None. IMPRESSION: No acute intracranial abnormality. Cerebral atrophy and chronic small vessel disease. Electronically Signed   By: Earle Gell M.D.   On: 05/24/2017 20:25   Dg Chest Portable 1 View  Result Date: 05/24/2017 CLINICAL DATA:  Post intubation EXAM: PORTABLE CHEST 1 VIEW COMPARISON:  03/21/2014 FINDINGS: Endotracheal tube tip is about 1.7 cm superior to the carina. Esophageal tube tip is below the diaphragm. Diffuse coarse interstitial opacity likely chronic. No consolidation or effusion. Stable cardiomediastinal silhouette with atherosclerosis. No pneumothorax. IMPRESSION: Endotracheal tube tip about 1.7 cm superior to carina Probable coarse chronic interstitial opacity. No edema or consolidation. Electronically Signed   By: Donavan Foil M.D.   On: 05/24/2017 20:08    Procedures .Critical Care Performed by: MUPilar Plate Authorized by: Payton Emerald   Critical care provider statement:    Critical care time (minutes):  35   Critical care time was exclusive of:  Separately billable procedures and treating other patients and teaching time   Critical care was necessary to treat or prevent imminent or life-threatening deterioration of the following conditions:  Cardiac failure, circulatory failure and CNS failure or compromise   Critical care was time spent personally by me on the following activities:  Ordering and performing treatments and  interventions, development of treatment plan with patient or surrogate, discussions with consultants, ordering and review of laboratory studies, ordering and review of radiographic studies, pulse oximetry, re-evaluation of patient's condition, evaluation of patient's response to treatment, examination of patient, review of old charts and ventilator management   I  assumed direction of critical care for this patient from another provider in my specialty: no      (including critical care time)  INTUBATION Performed by: Payton Emerald  Required items: required blood products, implants, devices, and special equipment available Patient identity confirmed: provided demographic data and hospital-assigned identification number Time out: Immediately prior to procedure a "time out" was called to verify the correct patient, procedure, equipment, support staff and site/side marked as required.  Indications: Somnolence, airway protection  Intubation method: Glidescope Laryngoscopy   Preoxygenation: BVM  Sedatives: Etomidate Paralytic: Rocuronium  Tube Size: 7-5 cuffed  Post-procedure assessment: chest rise and ETCO2 monitor Breath sounds: equal and absent over the epigastrium Tube secured with: ETT holder Chest x-ray interpreted by radiologist and me.  Chest x-ray findings: endotracheal tube deep, pulled back 2cm  Patient tolerated the procedure well with no immediate complications.   Medications Ordered in ED Medications  fentaNYL 253mcg in NS 273mL (40mcg/ml) infusion-PREMIX (150 mcg/hr Intravenous Rate/Dose Change 05/25/17 0000)  fentaNYL (SUBLIMAZE) bolus via infusion 25 mcg (not administered)  0.9 %  sodium chloride infusion (not administered)  pantoprazole (PROTONIX) injection 40 mg (0 mg Intravenous Hold 05/25/17 0023)  insulin aspart (novoLOG) injection 2-6 Units (2 Units Subcutaneous Not Given 05/25/17 0018)  heparin injection 5,000 Units (0 Units Subcutaneous Hold 05/25/17 0018)  0.9  %  sodium chloride infusion ( Intravenous Restarted 05/24/17 2130)  hydrALAZINE (APRESOLINE) injection 5 mg (not administered)  etomidate (AMIDATE) injection 15 mg (15 mg Intravenous Given 05/24/17 1923)  rocuronium (ZEMURON) injection 70 mg (70 mg Intravenous Given 05/24/17 1924)  magnesium sulfate IVPB 2 g 50 mL (0 g Intravenous Stopped 05/25/17 0041)  sodium chloride 0.9 % bolus 1,000 mL (0 mLs Intravenous Stopped 05/24/17 2315)    And  sodium chloride 0.9 % bolus 1,000 mL (0 mLs Intravenous Stopped 05/24/17 2321)  glucagon (GLUCAGEN)  IVPB (bolus for beta blocker/calcium channel bocker overdose) (0 mg Intravenous Stopped 05/24/17 2331)     Initial Impression / Assessment and Plan / ED Course  I have reviewed the triage vital signs and the nursing notes.  Pertinent labs & imaging results that were available during my care of the patient were reviewed by me and considered in my medical decision making (see chart for details).     41 yoF h/o diabetes, dementia, breast cancer, HTN, osteoporosis who p/w AMS and bradycardia. Intubated for airway protection. Externally paced at 70bpm.   Son arrived at bedside and updated on condition. On initial discussion he elects to proceed with full code based on his understanding of patient's wishes.  Cardiology consulted. EKG showing sinus rhythm with prolonged PR with RBBB. Critical care consulted and discussed patient's condition with family. Patient made DNR. EMS cardiac tracing showing complete heart block with periods of asystole. Concern for poor neurologic outcome with unknown downtime with poor perfusion. Pt admitted for further management and evaluation.  Pt care d/w Dr. Tomi Bamberger  Final Clinical Impressions(s) / ED Diagnoses   Final diagnoses:  Ventilator dependent [facility])    New Prescriptions Current Discharge Medication List       Payton Emerald, MD 05/25/17 0923    Dorie Rank, MD 05/26/17 [phone]

## 2017-05-24 NOTE — ED Notes (Signed)
Dr. Dorie Rank ( EDP ) notified on pt.'s elevated lactic acid .

## 2017-05-24 NOTE — ED Notes (Signed)
Patient transported to CT scan . 

## 2017-05-24 NOTE — ED Notes (Signed)
Patient placed on a Zoll pacing at 70/min , intubated by EDP , IV site intact .

## 2017-05-24 NOTE — ED Provider Notes (Signed)
Pt is a 83 y.o. female who presents with altered mental status and bradycardia.  Family members found the patient unresponsive in the home. EMS arrived and noted that her heart rate was in the teens.  EMS started external pacing. The patient became more alert.    Chief Complaint  Patient presents with  . Bradycardia    HR=15/min    Physical Exam  Constitutional: She appears distressed.  HENT:  Head: Normocephalic.  Mouth/Throat: No oropharyngeal exudate.  Eyes: Pupils are equal, round, and reactive to light. Conjunctivae are normal.  Neck: No tracheal deviation present.  Cardiovascular:  Murmur heard. Palpable pulses while on the external pacer  Pulmonary/Chest: Effort normal and breath sounds normal. No stridor.  Abdominal: Soft. She exhibits no distension. There is no tenderness. There is no rebound and no guarding.  Musculoskeletal: Normal range of motion. She exhibits no edema.  Neurological:  Patient initially was answering some yes and no questions but her speech was very slow garbled responses were minimal  Skin: She is not diaphoretic.      .Critical Care Performed by: Dorie Rank Authorized by: Dorie Rank   Critical care provider statement:    Critical care time (minutes):  45   Critical care was necessary to treat or prevent imminent or life-threatening deterioration of the following conditions:  Cardiac failure   Critical care was time spent personally by me on the following activities:  Discussions with consultants, evaluation of patient's response to treatment, examination of patient, ordering and performing treatments and interventions, ordering and review of laboratory studies, ordering and review of radiographic studies, pulse oximetry, re-evaluation of patient's condition, obtaining history from patient or surrogate and review of old charts     EKG Interpretation  Date/Time:  Thursday May 24 2017 19:35:39 EDT Ventricular Rate:  72 PR Interval:    QRS  Duration: 158 QT Interval:  470 QTC Calculation: 515 R Axis:   113 Text Interpretation:  Sinus rhythm Prolonged PR interval RBBB and LPFB Abnormal T, consider ischemia, inferior leads atrial fibrillation not present on ECG external pacing   Confirmed by Dorie Rank [phone]) on 05/24/2017 7:44:06 PM      External pacing was continued.  Patient was intubated to maintain her airway and so she could tolerate her pacing.  Cardiology and critical care were consulted.  Family initially wanted to continue all efforts.   Admitted to the ICU for further treatment.   1. Ventilator dependent ([place])     I saw and evaluated the patient, reviewed the resident's note and I agree with the findings and plan.     Dorie Rank, MD 05/24/17 [phone]

## 2017-05-24 NOTE — ED Notes (Signed)
Admitting MD explained plan of care and admission to pt.'s family . ETT/OGT intact , IV sites unremarkable , Fentanyl IV drip infusing at 75 mcg/min .

## 2017-05-25 ENCOUNTER — Inpatient Hospital Stay (HOSPITAL_COMMUNITY): Payer: Medicare Other

## 2017-05-25 DIAGNOSIS — J9600 Acute respiratory failure, unspecified whether with hypoxia or hypercapnia: Secondary | ICD-10-CM

## 2017-05-25 DIAGNOSIS — I469 Cardiac arrest, cause unspecified: Secondary | ICD-10-CM

## 2017-05-25 DIAGNOSIS — L8990 Pressure ulcer of unspecified site, unspecified stage: Secondary | ICD-10-CM | POA: Insufficient documentation

## 2017-05-25 LAB — CBC
HEMATOCRIT: 38.6 % (ref 36.0–46.0)
Hemoglobin: 12.5 g/dL (ref 12.0–15.0)
MCH: 29.1 pg (ref 26.0–34.0)
MCHC: 32.4 g/dL (ref 30.0–36.0)
MCV: 90 fL (ref 78.0–100.0)
Platelets: 149 10*3/uL — ABNORMAL LOW (ref 150–400)
RBC: 4.29 MIL/uL (ref 3.87–5.11)
RDW: 15.3 % (ref 11.5–15.5)
WBC: 14.2 10*3/uL — ABNORMAL HIGH (ref 4.0–10.5)

## 2017-05-25 LAB — URINALYSIS, COMPLETE (UACMP) WITH MICROSCOPIC
Bilirubin Urine: NEGATIVE
GLUCOSE, UA: 150 mg/dL — AB
Ketones, ur: NEGATIVE mg/dL
Leukocytes, UA: NEGATIVE
Nitrite: POSITIVE — AB
PROTEIN: NEGATIVE mg/dL
SPECIFIC GRAVITY, URINE: 1.006 (ref 1.005–1.030)
Squamous Epithelial / LPF: NONE SEEN
pH: 7 (ref 5.0–8.0)

## 2017-05-25 LAB — GLUCOSE, CAPILLARY
GLUCOSE-CAPILLARY: 121 mg/dL — AB (ref 65–99)
GLUCOSE-CAPILLARY: 198 mg/dL — AB (ref 65–99)
Glucose-Capillary: 198 mg/dL — ABNORMAL HIGH (ref 65–99)
Glucose-Capillary: 103 mg/dL — ABNORMAL HIGH (ref 65–99)
Glucose-Capillary: 88 mg/dL (ref 65–99)
Glucose-Capillary: 94 mg/dL (ref 65–99)

## 2017-05-25 LAB — HEPATIC FUNCTION PANEL
ALK PHOS: 50 U/L (ref 38–126)
ALT: 68 U/L — ABNORMAL HIGH (ref 14–54)
AST: 72 U/L — ABNORMAL HIGH (ref 15–41)
Albumin: 3.4 g/dL — ABNORMAL LOW (ref 3.5–5.0)
BILIRUBIN DIRECT: 0.2 mg/dL (ref 0.1–0.5)
Indirect Bilirubin: 0.5 mg/dL (ref 0.3–0.9)
Total Bilirubin: 0.7 mg/dL (ref 0.3–1.2)
Total Protein: 5.8 g/dL — ABNORMAL LOW (ref 6.5–8.1)

## 2017-05-25 LAB — BASIC METABOLIC PANEL
ANION GAP: 11 (ref 5–15)
BUN: 35 mg/dL — AB (ref 6–20)
CHLORIDE: 104 mmol/L (ref 101–111)
CO2: 24 mmol/L (ref 22–32)
Calcium: 8.5 mg/dL — ABNORMAL LOW (ref 8.9–10.3)
Creatinine, Ser: 1.16 mg/dL — ABNORMAL HIGH (ref 0.44–1.00)
GFR, EST AFRICAN AMERICAN: 44 mL/min — AB (ref >60)
GFR, EST NON AFRICAN AMERICAN: 38 mL/min — AB (ref >60)
Glucose, Bld: 178 mg/dL — ABNORMAL HIGH (ref 65–99)
POTASSIUM: 3.8 mmol/L (ref 3.5–5.1)
Sodium: 139 mmol/L (ref 135–145)

## 2017-05-25 LAB — MRSA PCR SCREENING: MRSA by PCR: NEGATIVE

## 2017-05-25 LAB — RAPID URINE DRUG SCREEN, HOSP PERFORMED
AMPHETAMINES: NOT DETECTED
BARBITURATES: NOT DETECTED
BENZODIAZEPINES: NOT DETECTED
COCAINE: NOT DETECTED
Opiates: NOT DETECTED
TETRAHYDROCANNABINOL: NOT DETECTED

## 2017-05-25 LAB — PHOSPHORUS: Phosphorus: 2.7 mg/dL (ref 2.5–4.6)

## 2017-05-25 LAB — LACTIC ACID, PLASMA: LACTIC ACID, VENOUS: 1.3 mmol/L (ref 0.5–1.9)

## 2017-05-25 LAB — MAGNESIUM: Magnesium: 2.2 mg/dL (ref 1.7–2.4)

## 2017-05-25 LAB — PROCALCITONIN: Procalcitonin: 0.24 ng/mL

## 2017-05-25 MED ORDER — DEXTROSE 5 % IV SOLN
1.0000 g | INTRAVENOUS | Status: DC
  Administered 2017-05-25 – 2017-05-26 (×2): 1 g via INTRAVENOUS
  Filled 2017-05-25 (×3): qty 10

## 2017-05-25 MED ORDER — HYDRALAZINE HCL 20 MG/ML IJ SOLN: 5.0000 mg | Freq: Four times a day (QID) | INTRAMUSCULAR | Status: DC | PRN

## 2017-05-25 NOTE — Significant Event (Signed)
.. ..    Name: Audrey Reed MRN: [id number] DOB: [birth year]    ADMISSION DATE:  05/24/2017  Assessed this AM  BP much more controlled with Hydralazine prn Overnight continued to have bradycardia  Has a h/o home beta blockers given glucagon with no change in HR Per my discussion with the family last night we will wean sedation this AM and do a SBT followed by a one way extubation.   Signed Dr Seward Carol Pulmonary Critical Care [facility] [place]   05/25/2017, 6:59 AM

## 2017-05-25 NOTE — Progress Notes (Signed)
50 mls Fentanyl wasted with [place].

## 2017-05-25 NOTE — Progress Notes (Signed)
PULMONARY / CRITICAL CARE MEDICINE   Name: ROMESHA SCHERER MRN: [id number] DOB: May 29, 1918    ADMISSION DATE:  05/24/2017 CONSULTATION DATE:  05/24/2017  REFERRING MD:  Dr. Rene Kocher  CHIEF COMPLAINT:  Cardiac Arrest   HISTORY OF PRESENT ILLNESS:   83 year old female with PMH of Adenocarcinoma of breast, CAD, Dementia, DM, HTN.   Presents to ED after being found down at home by her son and was disoriented and confused. When EMS arrived HR 15, placed on pacer pads. Intubated upon arrival to ED. LA 6.10, Crt 1.43, AST/ALT 114/69, Temp 96.4. CXR and Head CT with no acute findings. PCCM asked to admit.    REVIEW OF SYSTEMS:   Unable to review as patient is intubated and sedated   SUBJECTIVE:  Patient much more awake this morning, appears comfortable.  Son and daughter-in-law are at bedside.   VITAL SIGNS: BP (!) 115/50   Pulse 75   Temp 98.3 F (36.8 C) (Oral)   Resp 16   Ht 5\' 2"  (1.575 m)   Wt 132 lb 7.9 oz (60.1 kg)   SpO2 99%   BMI 24.23 kg/m   HEMODYNAMICS:    VENTILATOR SETTINGS: Vent Mode: PRVC FiO2 (%):  [40 %-100 %] 40 % Set Rate:  [14 bmp-20 bmp] 20 bmp Vt Set:  [400 mL] 400 mL PEEP:  [5 cmH20] 5 cmH20 Plateau Pressure:  [13 cmH20-16 cmH20] 14 cmH20  INTAKE / OUTPUT: I/O last 3 completed shifts: In: 874.8 [I.V.:824.8; IV Piggyback:50] Out: 850 [Urine:850]  PHYSICAL EXAMINATION: General:  83 yo elderly female, intubated  Neuro:  EOMI, PERRL, responds to painful stimuli, able to follow commands  HEENT:  ETT in place  Cardiovascular:  Bradycardic, regular, no MRG  Lungs:  Clear breath sounds, no wheeze/crackles  Abdomen:  Non-distended, +bs  Musculoskeletal:  No edema  Skin:  Warm, dry, no rash   LABS: -LA 6>1.3  -ABG pH 7.304, pCO2 53.2, bicarb 26.7  -UDS NEG   -WBC 8.8>14.2 -Mag 2.2  -Phos 2.7 -PCT 0.24 -AST 72 -AST 68  BMET  Recent Labs Lab 05/24/17 1945 05/25/17 0346  NA 137 139  K 4.2 3.8  CL 101 104  CO2 22 24  BUN 40* 35*  CREATININE  1.43* 1.16*  GLUCOSE 303* 178*   Electrolytes  Recent Labs Lab 05/24/17 1945 05/25/17 0346  CALCIUM 8.7* 8.5*  MG 1.9 2.2  PHOS  --  2.7    CBC  Recent Labs Lab 05/24/17 1945 05/25/17 0346  WBC 8.8 14.2*  HGB 13.2 12.5  HCT 41.3 38.6  PLT 148* 149*   Coag's No results for input(s): APTT, INR in the last 168 hours.  Sepsis Markers  Recent Labs Lab 05/24/17 1951 05/25/17 0346  LATICACIDVEN 6.10* 1.3  PROCALCITON  --  0.24   ABG  Recent Labs Lab 05/24/17 2053  PHART 7.304*  PCO2ART 53.2*  PO2ART 313.0*   Liver Enzymes  Recent Labs Lab 05/24/17 1945 05/25/17 0346  AST 114* 72*  ALT 69* 68*  ALKPHOS 54 50  BILITOT 0.9 0.7  ALBUMIN 3.1* 3.4*   Cardiac Enzymes No results for input(s): TROPONINI, PROBNP in the last 168 hours.  Glucose  Recent Labs Lab 05/24/17 2104 05/24/17 2244 05/25/17 0021 05/25/17 0305 05/25/17 0744 05/25/17 1109  GLUCAP 217* 199* 198* 198* 103* 88   Imaging Ct Head Wo Contrast  Result Date: 05/24/2017 CLINICAL DATA:  Altered level of consciousness. EXAM: CT HEAD WITHOUT CONTRAST TECHNIQUE: Contiguous axial images were  obtained from the base of the skull through the vertex without intravenous contrast. COMPARISON:  11/03/2012 FINDINGS: Brain: No evidence of acute infarction, hemorrhage, hydrocephalus, extra-axial collection, or mass lesion/mass effect. Stable mild to moderate cerebral atrophy and chronic small vessel disease. Vascular:  No hyperdense vessel or other acute findings. Skull: No evidence of fracture or other significant bone abnormality. Sinuses/Orbits:  No acute findings. Other: None. IMPRESSION: No acute intracranial abnormality. Cerebral atrophy and chronic small vessel disease. Electronically Signed   By: Earle Gell M.D.   On: 05/24/2017 20:25   Dg Chest Port 1 View  Result Date: 05/25/2017 CLINICAL DATA:  Intubation. EXAM: PORTABLE CHEST 1 VIEW COMPARISON:  05/24/2017. FINDINGS: Endotracheal tube and NG  tube in stable position. Heart size normal. Low lung volumes . No focal infiltrate. No prominent pleural effusion. No pneumothorax. IMPRESSION: 1. Endotracheal tube and NG tube in stable position. 2. Low lung volumes. Electronically Signed   By: Marcello Moores  Register   On: 05/25/2017 06:42   Dg Chest Portable 1 View  Result Date: 05/24/2017 CLINICAL DATA:  Post intubation EXAM: PORTABLE CHEST 1 VIEW COMPARISON:  03/21/2014 FINDINGS: Endotracheal tube tip is about 1.7 cm superior to the carina. Esophageal tube tip is below the diaphragm. Diffuse coarse interstitial opacity likely chronic. No consolidation or effusion. Stable cardiomediastinal silhouette with atherosclerosis. No pneumothorax. IMPRESSION: Endotracheal tube tip about 1.7 cm superior to carina Probable coarse chronic interstitial opacity. No edema or consolidation. Electronically Signed   By: Donavan Foil M.D.   On: 05/24/2017 20:08   STUDIES:   CXR 10/18 > Endotracheal tube tip about 1.7 cm superior to carina Probable coarse chronic interstitial opacity. No edema or consolidation. CT Head 10/18 > No acute intracranial abnormality. Cerebral atrophy and chronic small vessel disease.  CULTURES: Blood 10/18 >> pending  Sputum 10/18 >> U/A 10/18 >> pos nitrite, neg leuks, many bacteria, small hgb   ANTIBIOTICS: None   SIGNIFICANT EVENTS: 10/18 > Presents to ED after being found down   LINES/TUBES: ETT 10/18 >>   DISCUSSION: 83 year old female found down at home, altered. Presents to ED bradycardic and unresponsive requiring intubation   ASSESSMENT / PLAN:  PULMONARY Respiratory Insufficiency in setting of Encephalopathy   Plan to extubate this morning Trend ABG/CXR VAP bundle   CARDIOVASCULAR Bradycardia  H/O HTN, HLD   Cardiac Monitoring   ECHO Pending  Cardiology Following  Holding home coreg, lasix, zocor   RENAL Non-Gap Metabolic Acidosis  Lactic acidosis, resolved  -LA 6.10 >> 1.3  Acute Renal Failure  Trend  BMP -Serum creatinine improving  Replace electrolytes as indicated  NS @ 20ml/hr   GASTROINTESTINAL A:   Elevated Transaminases  P:   NPO x 4 h post extubation, sips and chips Advance diet as tolerated    HEMATOLOGIC A:   H/O Malignant Neoplasm of Breast > no evidence of recurrence  P:  Trend CBC  Heparin SQ for VTE  SCDs   INFECTIOUS Hypothermia > Found down  -No source of infection, CXR clear, U/A pending  Trend WBC and Fever Curve Follow Culture Data  Trend WBC and Fever Curve  Trend PCT and LA  Monitor off antibiotics > low thrush hold to start   ENDOCRINE Hyperglycemia  H/O DM     Trend Glucose - CBG 88-190 SSI   NEUROLOGIC Metabolic Encephalopathy  H/O Dementia  -CT Head Negative  -Monitor mental status, appears at baseline  -PT to see    FAMILY  - Updates: Updated  at bedside, Goals of care with son > DNR status, will withdrawal in AM if no improvement   - Inter-disciplinary family meet or Palliative Care meeting due by: 05/31/2017  Lovenia Kim, MD [place], PGY-2

## 2017-05-25 NOTE — Progress Notes (Signed)
Inpatient Diabetes Program Recommendations  AACE/ADA: New Consensus Statement on Inpatient Glycemic Control (2015)  Target Ranges:  Prepandial:   less than 140 mg/dL      Peak postprandial:   less than 180 mg/dL (1-2 hours)      Critically ill patients:  140 - 180 mg/dL   Lab Results  Component Value Date   GLUCAP 88 05/25/2017   HGBA1C 6.0 10/13/2015    Review of Glycemic ControlResults for CADENCE, MINTON (MRN [id number]) as of 05/25/2017 14:07  Ref. Range 05/24/2017 21:04 05/24/2017 22:44 05/25/2017 00:21 05/25/2017 03:05 05/25/2017 07:44 05/25/2017 11:09  Glucose-Capillary Latest Ref Range: 65 - 99 mg/dL 217 (H) 199 (H) 198 (H) 198 (H) 103 (H) 88    Diabetes history: None Current orders for Inpatient glycemic control: Novolog 2-4-6 q 4 hours  Inpatient Diabetes Program Recommendations:    No history of DM.  CBG's <100 mg/dL.  Please consider d/c of ICU glycemic control order set/Novolog.   Thanks, Adah Perl, RN, BC-ADM Inpatient Diabetes Coordinator Pager [phone] (8a-5p)

## 2017-05-25 NOTE — Progress Notes (Signed)
Pt was transferred to 2W from [facility] around 2330. Skin assessed with Leeann Must, refer to flowsheets. Pt is alert and oriented. Pt came in with a fall and was placed in a low bed d/t to that and age of 69. Pt on tele, box 1 and CCMD notified. Denies pain. Pt is resting comfortably in bed. Will continue to monitor.   Audrey Neighbor, RN

## 2017-05-25 NOTE — Procedures (Signed)
Extubation Procedure Note  Patient Details:   Name: ALINA GILKEY DOB: 1918/06/29 MRN: [id number]   Airway Documentation:     Evaluation  O2 sats: stable throughout Complications: No apparent complications Patient did tolerate procedure well. Bilateral Breath Sounds: Clear, Diminished   Yes   Pt extubated to 3L [place] per MD order. Pt stable throughout with no complications.VS within normal limits. Pt able to speak and has a strong productive cough post extubation. Pt encouraged to use yankauer to clear secretions. RT will continue to monitor.   Jesse Sans 05/25/2017, 9:29 AM

## 2017-05-25 NOTE — Progress Notes (Signed)
Pts purwick replaced and peri care completed.

## 2017-05-26 ENCOUNTER — Encounter (HOSPITAL_COMMUNITY): Payer: Self-pay | Admitting: *Deleted

## 2017-05-26 ENCOUNTER — Other Ambulatory Visit (HOSPITAL_COMMUNITY): Payer: Medicare Other

## 2017-05-26 ENCOUNTER — Inpatient Hospital Stay (HOSPITAL_COMMUNITY): Payer: Medicare Other

## 2017-05-26 DIAGNOSIS — J9602 Acute respiratory failure with hypercapnia: Secondary | ICD-10-CM

## 2017-05-26 DIAGNOSIS — N183 Chronic kidney disease, stage 3 unspecified: Secondary | ICD-10-CM

## 2017-05-26 DIAGNOSIS — I340 Nonrheumatic mitral (valve) insufficiency: Secondary | ICD-10-CM

## 2017-05-26 DIAGNOSIS — N390 Urinary tract infection, site not specified: Secondary | ICD-10-CM

## 2017-05-26 DIAGNOSIS — I10 Essential (primary) hypertension: Secondary | ICD-10-CM

## 2017-05-26 LAB — ECHOCARDIOGRAM COMPLETE
Height: 62 in
WEIGHTICAEL: 2127 [oz_av]

## 2017-05-26 LAB — BASIC METABOLIC PANEL
Anion gap: 10 (ref 5–15)
BUN: 23 mg/dL — ABNORMAL HIGH (ref 6–20)
CALCIUM: 8.5 mg/dL — AB (ref 8.9–10.3)
CO2: 20 mmol/L — AB (ref 22–32)
CREATININE: 1.09 mg/dL — AB (ref 0.44–1.00)
Chloride: 109 mmol/L (ref 101–111)
GFR calc Af Amer: 48 mL/min — ABNORMAL LOW (ref >60)
GFR, EST NON AFRICAN AMERICAN: 41 mL/min — AB (ref >60)
GLUCOSE: 120 mg/dL — AB (ref 65–99)
Potassium: 4.1 mmol/L (ref 3.5–5.1)
Sodium: 139 mmol/L (ref 135–145)

## 2017-05-26 LAB — PROCALCITONIN: PROCALCITONIN: 0.77 ng/mL

## 2017-05-26 LAB — CBC
HCT: 39.4 % (ref 36.0–46.0)
Hemoglobin: 12.9 g/dL (ref 12.0–15.0)
MCH: 30 pg (ref 26.0–34.0)
MCHC: 32.7 g/dL (ref 30.0–36.0)
MCV: 91.6 fL (ref 78.0–100.0)
PLATELETS: 159 10*3/uL (ref 150–400)
RBC: 4.3 MIL/uL (ref 3.87–5.11)
RDW: 16.2 % — AB (ref 11.5–15.5)
WBC: 13.4 10*3/uL — ABNORMAL HIGH (ref 4.0–10.5)

## 2017-05-26 LAB — URINE CULTURE

## 2017-05-26 LAB — GLUCOSE, CAPILLARY: GLUCOSE-CAPILLARY: 117 mg/dL — AB (ref 65–99)

## 2017-05-26 MED ORDER — INFLUENZA VAC SPLIT HIGH-DOSE 0.5 ML IM SUSY
0.5000 mL | PREFILLED_SYRINGE | INTRAMUSCULAR | Status: AC
  Administered 2017-05-27: 0.5 mL via INTRAMUSCULAR
  Filled 2017-05-26: qty 0.5

## 2017-05-26 NOTE — Progress Notes (Signed)
  Echocardiogram 2D Echocardiogram has been performed.  Audrey Reed Audrey Reed 05/26/2017, 2:24 PM

## 2017-05-26 NOTE — Progress Notes (Signed)
PROGRESS NOTE    Audrey Reed   ZOX:[id number]  DOB: Oct 02, 1917  DOA: 05/24/2017 PCP: Janith Lima, MD   Brief Narrative:  Audrey Reed is a 83 year old female who lives alone and has a PMH of Adenocarcinoma of breast, CAD, Dementia, DM, HTN who was found disoriented/confused on the living room floor by her son who checks on her twice a day. Her HR was 15/min. She is on Coreg at home.  She was paced by EMS with Zoll pads and intubated in the ER due to continued somnolence.  Lactic acid 6.10, Temp 96.4 CT head and CXR unrevealing.  Extubated on 10/19 AM.    Subjective: Awake. Not aware of why she is in the hospital. She has been to SNF before and tells me that she does not want to go back to one when discharged.  ROS: no complaints of nausea, vomiting, constipation diarrhea, cough, dyspnea or dysuria. No other complaints.   Assessment & Plan:   Principal Problem:   Bradycardia, sinus, persistent, severe - ? Due to Coreg 12.5 BID - HR has recovered and is in 80-90 range - ECHO pending - Cardiology saw her on 10/1   Active Problems: Acute hypercapnic resp failure in setting of acute metabolic encephalopathy - pH 7.04 and pCO2 53 in ER - extubated 10/19 and quite stable    CKD (chronic kidney disease) stage 3, GFR 30-59 ml/min - stable Cr    Essential hypertension - holding Coreg and lasix  UTI/ leukocytosis - WBC count was 14.2- slightly improved - on Rocephin - started 10/19- culture pending    Dementia - seems to be mild at most    DVT prophylaxis: Heparin Code Status: DNR Family Communication:   Disposition Plan: to be determined Consultants:   cardiology Procedures:    Antimicrobials:  Anti-infectives    Start     Dose/Rate Route Frequency Ordered Stop   05/25/17 1400  cefTRIAXone (ROCEPHIN) 1 g in dextrose 5 % 50 mL IVPB     1 g 100 mL/hr over 30 Minutes Intravenous Every 24 hours 05/25/17 1314         Objective: Vitals:   05/25/17 2300  05/26/17 0203 05/26/17 0630 05/26/17 0900  BP: (!) 146/80 128/79 130/82 (!) 138/52  Pulse: 72 70 68 80  Resp: (!) 29 (!) 21 20 20   Temp:  98.3 F (36.8 C) 98.3 F (36.8 C) 99.8 F (37.7 C)  TempSrc:  Oral Oral Oral  SpO2: 99% 100% 100% 100%  Weight:  60.3 kg (132 lb 15 oz)    Height:        Intake/Output Summary (Last 24 hours) at 05/26/17 1156 Last data filed at 05/26/17 0900  Gross per 24 hour  Intake             1595 ml  Output              925 ml  Net              670 ml   Filed Weights   05/25/17 0000 05/25/17 0144 05/26/17 0203  Weight: 60.1 kg (132 lb 7.9 oz) 60.1 kg (132 lb 7.9 oz) 60.3 kg (132 lb 15 oz)    Examination: General exam: Appears comfortable  HEENT: PERRLA, oral mucosa moist, no sclera icterus or thrush Respiratory system: Clear to auscultation. Respiratory effort normal. Cardiovascular system: S1 & S2 heard, RRR.  No murmurs  Gastrointestinal system: Abdomen soft, non-tender, nondistended. Normal bowel sound.  No organomegaly Central nervous system: Alert and oriented. No focal neurological deficits. Extremities: No cyanosis, clubbing or edema Skin: No rashes or ulcers Psychiatry:  Mood & affect appropriate.     Data Reviewed: I have personally reviewed following labs and imaging studies  CBC:  Recent Labs Lab 05/24/17 1945 05/25/17 0346 05/26/17 0208  WBC 8.8 14.2* 13.4*  NEUTROABS 6.5  --   --   HGB 13.2 12.5 12.9  HCT 41.3 38.6 39.4  MCV 91.8 90.0 91.6  PLT 148* 149* 607   Basic Metabolic Panel:  Recent Labs Lab 05/24/17 1945 05/25/17 0346 05/26/17 0208  NA 137 139 139  K 4.2 3.8 4.1  CL 101 104 109  CO2 22 24 20*  GLUCOSE 303* 178* 120*  BUN 40* 35* 23*  CREATININE 1.43* 1.16* 1.09*  CALCIUM 8.7* 8.5* 8.5*  MG 1.9 2.2  --   PHOS  --  2.7  --    GFR: Estimated Creatinine Clearance: 25.2 mL/min (A) (by C-G formula based on SCr of 1.09 mg/dL (H)). Liver Function Tests:  Recent Labs Lab 05/24/17 1945 05/25/17 0346    AST 114* 72*  ALT 69* 68*  ALKPHOS 54 50  BILITOT 0.9 0.7  PROT 5.8* 5.8*  ALBUMIN 3.1* 3.4*   No results for input(s): LIPASE, AMYLASE in the last 168 hours.  Recent Labs Lab 05/24/17 1945  AMMONIA 31   Coagulation Profile: No results for input(s): INR, PROTIME in the last 168 hours. Cardiac Enzymes: No results for input(s): CKTOTAL, CKMB, CKMBINDEX, TROPONINI in the last 168 hours. BNP (last 3 results) No results for input(s): PROBNP in the last 8760 hours. HbA1C: No results for input(s): HGBA1C in the last 72 hours. CBG:  Recent Labs Lab 05/25/17 0744 05/25/17 1109 05/25/17 1555 05/25/17 2324 05/26/17 0545  GLUCAP 103* 88 94 121* 117*   Lipid Profile:  Recent Labs  05/24/17 1945  TRIG 64   Thyroid Function Tests: No results for input(s): TSH, T4TOTAL, FREET4, T3FREE, THYROIDAB in the last 72 hours. Anemia Panel: No results for input(s): VITAMINB12, FOLATE, FERRITIN, TIBC, IRON, RETICCTPCT in the last 72 hours. Urine analysis:    Component Value Date/Time   COLORURINE STRAW (A) 05/25/2017 0023   APPEARANCEUR CLEAR 05/25/2017 0023   LABSPEC 1.006 05/25/2017 0023   PHURINE 7.0 05/25/2017 0023   GLUCOSEU 150 (A) 05/25/2017 0023   GLUCOSEU NEGATIVE 10/13/2015 0851   HGBUR SMALL (A) 05/25/2017 0023   BILIRUBINUR NEGATIVE 05/25/2017 0023   BILIRUBINUR neg 10/27/2015 1119   KETONESUR NEGATIVE 05/25/2017 0023   PROTEINUR NEGATIVE 05/25/2017 0023   UROBILINOGEN 0.2 10/27/2015 1119   UROBILINOGEN 0.2 10/13/2015 0851   NITRITE POSITIVE (A) 05/25/2017 0023   LEUKOCYTESUR NEGATIVE 05/25/2017 0023   Sepsis Labs: @LABRCNTIP (procalcitonin:4,lacticidven:4) ) Recent Results (from the past 240 hour(s))  MRSA PCR Screening     Status: None   Collection Time: 05/25/17 12:24 AM  Result Value Ref Range Status   MRSA by PCR NEGATIVE NEGATIVE Final    Comment:        The GeneXpert MRSA Assay (FDA approved for NASAL specimens only), is one component of  a comprehensive MRSA colonization surveillance program. It is not intended to diagnose MRSA infection nor to guide or monitor treatment for MRSA infections.          Radiology Studies: Ct Head Wo Contrast  Result Date: 05/24/2017 CLINICAL DATA:  Altered level of consciousness. EXAM: CT HEAD WITHOUT CONTRAST TECHNIQUE: Contiguous axial images were obtained from the base  of the skull through the vertex without intravenous contrast. COMPARISON:  11/03/2012 FINDINGS: Brain: No evidence of acute infarction, hemorrhage, hydrocephalus, extra-axial collection, or mass lesion/mass effect. Stable mild to moderate cerebral atrophy and chronic small vessel disease. Vascular:  No hyperdense vessel or other acute findings. Skull: No evidence of fracture or other significant bone abnormality. Sinuses/Orbits:  No acute findings. Other: None. IMPRESSION: No acute intracranial abnormality. Cerebral atrophy and chronic small vessel disease. Electronically Signed   By: Earle Gell M.D.   On: 05/24/2017 20:25   Dg Chest Port 1 View  Result Date: 05/25/2017 CLINICAL DATA:  Intubation. EXAM: PORTABLE CHEST 1 VIEW COMPARISON:  05/24/2017. FINDINGS: Endotracheal tube and NG tube in stable position. Heart size normal. Low lung volumes . No focal infiltrate. No prominent pleural effusion. No pneumothorax. IMPRESSION: 1. Endotracheal tube and NG tube in stable position. 2. Low lung volumes. Electronically Signed   By: Marcello Moores  Register   On: 05/25/2017 06:42   Dg Chest Portable 1 View  Result Date: 05/24/2017 CLINICAL DATA:  Post intubation EXAM: PORTABLE CHEST 1 VIEW COMPARISON:  03/21/2014 FINDINGS: Endotracheal tube tip is about 1.7 cm superior to the carina. Esophageal tube tip is below the diaphragm. Diffuse coarse interstitial opacity likely chronic. No consolidation or effusion. Stable cardiomediastinal silhouette with atherosclerosis. No pneumothorax. IMPRESSION: Endotracheal tube tip about 1.7 cm superior to  carina Probable coarse chronic interstitial opacity. No edema or consolidation. Electronically Signed   By: Donavan Foil M.D.   On: 05/24/2017 20:08      Scheduled Meds: . heparin  5,000 Units Subcutaneous Q8H   Continuous Infusions: . sodium chloride    . sodium chloride 75 mL/hr at 05/25/17 1800  . cefTRIAXone (ROCEPHIN)  IV Stopped (05/25/17 1530)     LOS: 2 days    Time spent in minutes: 35    Debbe Odea, MD [facility] Pager: [email or web address] Password [email or web address] 05/26/2017, 11:56 AM

## 2017-05-26 NOTE — Progress Notes (Addendum)
Progress Note  Patient Name: Audrey Reed Date of Encounter: 05/26/2017  Primary Cardiologist: Dr. Fransico Him  Subjective   No chest pain or breathlessness at rest.  Inpatient Medications    Scheduled Meds: . heparin  5,000 Units Subcutaneous Q8H   Continuous Infusions: . sodium chloride    . sodium chloride 75 mL/hr at 05/25/17 1800  . cefTRIAXone (ROCEPHIN)  IV 1 g (05/26/17 1308)   PRN Meds: sodium chloride, hydrALAZINE   Vital Signs    Vitals:   05/25/17 2300 05/26/17 0203 05/26/17 0630 05/26/17 0900  BP: (!) 146/80 128/79 130/82 (!) 138/52  Pulse: 72 70 68 80  Resp: (!) 29 (!) 21 20 20   Temp:  98.3 F (36.8 C) 98.3 F (36.8 C) 99.8 F (37.7 C)  TempSrc:  Oral Oral Oral  SpO2: 99% 100% 100% 100%  Weight:  132 lb 15 oz (60.3 kg)    Height:        Intake/Output Summary (Last 24 hours) at 05/26/17 1345 Last data filed at 05/26/17 0900  Gross per 24 hour  Intake             1520 ml  Output              925 ml  Net              595 ml   Filed Weights   05/25/17 0000 05/25/17 0144 05/26/17 0203  Weight: 132 lb 7.9 oz (60.1 kg) 132 lb 7.9 oz (60.1 kg) 132 lb 15 oz (60.3 kg)    Telemetry    Sinus rhythm with occasional PVCs. No sustained arrhythmias or pauses. Personally reviewed.  ECG    Tracing from 05/24/2017 shows sinus rhythm with prolonged PR interval, right bundle branch block, and left posterior to secure block. Personally reviewed.  Physical Exam   GEN: Frail-appearing elderly woman.No acute distress.   Neck: No JVD. Cardiac: RRR, 2/6 murmur, no gallop. Respiratory: Nonlabored. Clear to auscultation bilaterally. GI: Soft, nontender, bowel sounds present. MS: No edema; No deformity.  Labs    Chemistry Recent Labs Lab 05/24/17 1945 05/25/17 0346 05/26/17 0208  NA 137 139 139  K 4.2 3.8 4.1  CL 101 104 109  CO2 22 24 20*  GLUCOSE 303* 178* 120*  BUN 40* 35* 23*  CREATININE 1.43* 1.16* 1.09*  CALCIUM 8.7* 8.5* 8.5*  PROT  5.8* 5.8*  --   ALBUMIN 3.1* 3.4*  --   AST 114* 72*  --   ALT 69* 68*  --   ALKPHOS 54 50  --   BILITOT 0.9 0.7  --   GFRNONAA 30* 38* 41*  GFRAA 34* 44* 48*  ANIONGAP 14 11 10      Hematology Recent Labs Lab 05/24/17 1945 05/25/17 0346 05/26/17 0208  WBC 8.8 14.2* 13.4*  RBC 4.50 4.29 4.30  HGB 13.2 12.5 12.9  HCT 41.3 38.6 39.4  MCV 91.8 90.0 91.6  MCH 29.3 29.1 30.0  MCHC 32.0 32.4 32.7  RDW 15.5 15.3 16.2*  PLT 148* 149* 159    Radiology    Ct Head Wo Contrast  Result Date: 05/24/2017 CLINICAL DATA:  Altered level of consciousness. EXAM: CT HEAD WITHOUT CONTRAST TECHNIQUE: Contiguous axial images were obtained from the base of the skull through the vertex without intravenous contrast. COMPARISON:  11/03/2012 FINDINGS: Brain: No evidence of acute infarction, hemorrhage, hydrocephalus, extra-axial collection, or mass lesion/mass effect. Stable mild to moderate cerebral atrophy and chronic small vessel disease. Vascular:  No hyperdense vessel or other acute findings. Skull: No evidence of fracture or other significant bone abnormality. Sinuses/Orbits:  No acute findings. Other: None. IMPRESSION: No acute intracranial abnormality. Cerebral atrophy and chronic small vessel disease. Electronically Signed   By: Earle Gell M.D.   On: 05/24/2017 20:25   Dg Chest Port 1 View  Result Date: 05/25/2017 CLINICAL DATA:  Intubation. EXAM: PORTABLE CHEST 1 VIEW COMPARISON:  05/24/2017. FINDINGS: Endotracheal tube and NG tube in stable position. Heart size normal. Low lung volumes . No focal infiltrate. No prominent pleural effusion. No pneumothorax. IMPRESSION: 1. Endotracheal tube and NG tube in stable position. 2. Low lung volumes. Electronically Signed   By: Marcello Moores  Register   On: 05/25/2017 06:42   Dg Chest Portable 1 View  Result Date: 05/24/2017 CLINICAL DATA:  Post intubation EXAM: PORTABLE CHEST 1 VIEW COMPARISON:  03/21/2014 FINDINGS: Endotracheal tube tip is about 1.7 cm  superior to the carina. Esophageal tube tip is below the diaphragm. Diffuse coarse interstitial opacity likely chronic. No consolidation or effusion. Stable cardiomediastinal silhouette with atherosclerosis. No pneumothorax. IMPRESSION: Endotracheal tube tip about 1.7 cm superior to carina Probable coarse chronic interstitial opacity. No edema or consolidation. Electronically Signed   By: Donavan Foil M.D.   On: 05/24/2017 20:08    Cardiac Studies   Echocardiogram pending.  Patient Profile     83 y.o. female with a history of adenocarcinoma of the breast, dementia, and hypertension, admitted to the hospital after presenting with profound symptomatic bradycardia. Heart rate has improved significantly following discontinuation of Coreg. She does have evidence of conduction system disease at baseline by ECG.  Assessment & Plan    1. Profound symptomatic bradycardia, improved following discontinuation of Coreg. Patient has conduction system disease based on ECG. Recent telemetry has been stable, no pauses.  2. History of hypertension, previously on Coreg 12.5 mg twice daily as an outpatient. Recent systolics in the [address].  3. Dementia.  4. Hyperlipidemia, on Zocor.  Discussed with patient's son. She is undergoing an echocardiogram today for evaluation of cardiac structure and function. At this point, it does not look like she will require pacemaker since heart rate and rhythm have been stable following discontinuation of Coreg. If blood pressure trends up, would need to pick an agent without AV nodal blocking effect.  Signed, Rozann Lesches, MD  05/26/2017, 1:45 PM

## 2017-05-26 NOTE — Evaluation (Addendum)
Physical Therapy Evaluation Patient Details Name: Audrey Reed MRN: [id number] DOB: [birth year] Today's Date: 05/26/2017   History of Present Illness  Pt is a 83 y/o female admitted after being found down at home, disoriented and confused. Pt found to be in profound bradycardia with respiratory insufficiency in the setting of encephalopathy and was intubated 10/18-10/19. PMH including but not limited to dementia, CKD, HTN and hx of CVA.  Clinical Impression  Pt presented supine in bed with HOB elevated, awake and willing to participate in therapy session. Pt with hx of cognitive deficits at baseline (dementia) and no family/caregivers present to confirm information provided by pt. Pt reported that PTA she ambulated with use of rollator at all times and was independent with ADLs. Pt stated that she lives alone but her son comes to check on her frequently throughout the day. Pt also stating that her son works and lives in [place] while she lives in [place]. Pt performed bed mobility with supervision, transfers with min A for stability and ambulated a short distance (~30') with RW and close min guard. Pt with one LOB with directional change requiring min A to maintain upright position. Pt is at a high risk for falls and very concerning that she lives alone. Pt would continue to benefit from skilled physical therapy services at this time while admitted and after d/c to address the below listed limitations in order to improve overall safety and independence with functional mobility.     Follow Up Recommendations SNF;Supervision/Assistance - 24 hour;Other (comment) (if pt refuses, will need 24/7 supervision, HHPT, [place] aide)    Equipment Recommendations  None recommended by PT    Recommendations for Other Services       Precautions / Restrictions Precautions Precautions: Fall Restrictions Weight Bearing Restrictions: No      Mobility  Bed Mobility Overal bed mobility: Needs  Assistance Bed Mobility: Supine to Sit;Sit to Supine     Supine to sit: Supervision Sit to supine: Supervision   General bed mobility comments: increased time and effort, supervision for safety  Transfers Overall transfer level: Needs assistance Equipment used: Rolling walker (2 wheeled) Transfers: Sit to/from Stand Sit to Stand: Min assist         General transfer comment: increased time and effort; pt required 2 attempts with use of momentum before successfully achieving full standing position on third attempt with min A for stability  Ambulation/Gait Ambulation/Gait assistance: Min guard;Min assist Ambulation Distance (Feet): 30 Feet Assistive device: Rolling walker (2 wheeled) Gait Pattern/deviations: Trunk flexed;Decreased stride length;Decreased step length - left;Decreased step length - right;Step-through pattern Gait velocity: decreased   General Gait Details: pt with modest instability requiring constant min guard for safety, pt with one LOB with directional change requiring min A to maintain upright  Stairs            Wheelchair Mobility    Modified Rankin (Stroke Patients Only)       Balance Overall balance assessment: Needs assistance Sitting-balance support: Feet supported Sitting balance-Leahy Scale: Fair Sitting balance - Comments: pt able to sit statically at EOB with supervision   Standing balance support: During functional activity;Bilateral upper extremity supported Standing balance-Leahy Scale: Poor Standing balance comment: reliant on bilateral UEs on RW; one LOB with dynamic balance task requiring min A                             Pertinent Vitals/Pain Pain Assessment: No/denies pain  Home Living Family/patient expects to be discharged to:: Private residence Living Arrangements: Alone Available Help at Discharge: Family;Available PRN/intermittently Type of Home: House Home Access: Stairs to enter   [employer] of Steps: 1 Home Layout: One level Home Equipment: Walker - 4 wheels      Prior Function Level of Independence: Independent with assistive device(s)         Comments: ambulates with rollator; pt reports being independent with ALDs but requiring assistance from her son with IADLs. Pt with hx of dementia and no caregiver/family members present     Hand Dominance        Extremity/Trunk Assessment   Upper Extremity Assessment Upper Extremity Assessment: Generalized weakness    Lower Extremity Assessment Lower Extremity Assessment: Generalized weakness    Cervical / Trunk Assessment Cervical / Trunk Assessment: Kyphotic  Communication   Communication: HOH  Cognition Arousal/Alertness: Awake/alert Behavior During Therapy: WFL for tasks assessed/performed Overall Cognitive Status: History of cognitive impairments - at baseline Area of Impairment: Memory;Following commands;Safety/judgement;Problem solving                     Memory: Decreased short-term memory Following Commands: Follows one step commands consistently;Follows multi-step commands with increased time Safety/Judgement: Decreased awareness of safety;Decreased awareness of deficits   Problem Solving: Difficulty sequencing;Requires verbal cues General Comments: pt with dementia at baseline      General Comments      Exercises     Assessment/Plan    PT Assessment Patient needs continued PT services  PT Problem List Decreased strength;Decreased balance;Decreased mobility;Decreased coordination;Decreased cognition;Decreased knowledge of use of DME;Decreased safety awareness       PT Treatment Interventions DME instruction;Gait training;Stair training;Therapeutic activities;Therapeutic exercise;Balance training;Functional mobility training;Neuromuscular re-education;Patient/family education    PT Goals (Current goals can be found in the Care Plan section)  Acute Rehab PT  Goals Patient Stated Goal: none stated PT Goal Formulation: Patient unable to participate in goal setting Time For Goal Achievement: 06/09/17 Potential to Achieve Goals: Fair    Frequency Min 3X/week   Barriers to discharge Decreased caregiver support      Co-evaluation               AM-PAC PT "6 Clicks" Daily Activity  Outcome Measure Difficulty turning over in bed (including adjusting bedclothes, sheets and blankets)?: A Little Difficulty moving from lying on back to sitting on the side of the bed? : A Little Difficulty sitting down on and standing up from a chair with arms (e.g., wheelchair, bedside commode, etc,.)?: Unable Help needed moving to and from a bed to chair (including a wheelchair)?: A Little Help needed walking in hospital room?: A Little Help needed climbing 3-5 steps with a railing? : A Lot 6 Click Score: 15    End of Session Equipment Utilized During Treatment: Gait belt Activity Tolerance: Patient tolerated treatment well Patient left: in bed;with call bell/phone within reach;with bed alarm set;with SCD's reapplied Nurse Communication: Mobility status PT Visit Diagnosis: Other abnormalities of gait and mobility (R26.89);Unsteadiness on feet (R26.81)    Time: 2778-2423 PT Time Calculation (min) (ACUTE ONLY): 17 min   Charges:   PT Evaluation $PT Eval Moderate Complexity: 1 Mod     PT G Codes:        [place], PT, DPT [phone]   [place] 05/26/2017, 9:38 AM

## 2017-05-27 ENCOUNTER — Encounter (HOSPITAL_COMMUNITY): Payer: Self-pay

## 2017-05-27 DIAGNOSIS — F0390 Unspecified dementia without behavioral disturbance: Secondary | ICD-10-CM

## 2017-05-27 LAB — BASIC METABOLIC PANEL
Anion gap: 6 (ref 5–15)
BUN: 16 mg/dL (ref 6–20)
CALCIUM: 8.7 mg/dL — AB (ref 8.9–10.3)
CO2: 20 mmol/L — ABNORMAL LOW (ref 22–32)
CREATININE: 1.02 mg/dL — AB (ref 0.44–1.00)
Chloride: 111 mmol/L (ref 101–111)
GFR, EST AFRICAN AMERICAN: 52 mL/min — AB (ref >60)
GFR, EST NON AFRICAN AMERICAN: 45 mL/min — AB (ref >60)
Glucose, Bld: 156 mg/dL — ABNORMAL HIGH (ref 65–99)
Potassium: 3.5 mmol/L (ref 3.5–5.1)
SODIUM: 137 mmol/L (ref 135–145)

## 2017-05-27 LAB — CBC
HCT: 42.4 % (ref 36.0–46.0)
Hemoglobin: 14 g/dL (ref 12.0–15.0)
MCH: 29.8 pg (ref 26.0–34.0)
MCHC: 33 g/dL (ref 30.0–36.0)
MCV: 90.2 fL (ref 78.0–100.0)
PLATELETS: 173 10*3/uL (ref 150–400)
RBC: 4.7 MIL/uL (ref 3.87–5.11)
RDW: 15.7 % — AB (ref 11.5–15.5)
WBC: 15.8 10*3/uL — AB (ref 4.0–10.5)

## 2017-05-27 LAB — GLUCOSE, CAPILLARY: GLUCOSE-CAPILLARY: 103 mg/dL — AB (ref 65–99)

## 2017-05-27 MED ORDER — DEXTROSE 5 % IV SOLN
1.0000 g | INTRAVENOUS | Status: AC
  Administered 2017-05-27: 1 g via INTRAVENOUS
  Filled 2017-05-27: qty 10

## 2017-05-27 MED ORDER — ENSURE PUDDING PO PUDG: 1.0000 | Freq: Three times a day (TID) | ORAL | Status: DC

## 2017-05-27 MED ORDER — SIMVASTATIN 20 MG PO TABS
20.0000 mg | ORAL_TABLET | Freq: Every day | ORAL | Status: DC
  Administered 2017-05-27 – 2017-05-28 (×2): 20 mg via ORAL
  Filled 2017-05-27 (×2): qty 1

## 2017-05-27 MED ORDER — HYDRALAZINE HCL 20 MG/ML IJ SOLN: 10.0000 mg | Freq: Four times a day (QID) | INTRAMUSCULAR | Status: DC | PRN

## 2017-05-27 MED ORDER — ENSURE ENLIVE PO LIQD
237.0000 mL | Freq: Two times a day (BID) | ORAL | Status: DC
  Administered 2017-05-27: 237 mL via ORAL
  Filled 2017-05-27: qty 237

## 2017-05-27 MED ORDER — HYDRALAZINE HCL 25 MG PO TABS
25.0000 mg | ORAL_TABLET | Freq: Two times a day (BID) | ORAL | Status: DC
  Administered 2017-05-27 – 2017-05-29 (×4): 25 mg via ORAL
  Filled 2017-05-27 (×6): qty 1

## 2017-05-27 NOTE — Progress Notes (Signed)
   Progress Note  Patient Name: Audrey Reed Date of Encounter: 05/27/2017  Primary Cardiologist: Dr. Fransico Him  Telemetry reviewed. She remains in sinus rhythm with occasional PVCs, no pauses or significant bradycardia since discontinuation of Coreg. Echocardiogram from yesterday demonstrated mild LVH with LVEF 60-65%, severe calcific aortic stenosis, and severe pulmonary hypertension with PASP estimated 64 mmHg. She has evidence of underlying conduction system disease at baseline as indicated previously, but no clear indication for pacemaker at this time. Based on advanced age and dementia, would not anticipate further aggressive cardiac workup of aortic stenosis.  Signed, Rozann Lesches, MD  05/27/2017, 12:37 PM

## 2017-05-27 NOTE — Progress Notes (Signed)
PROGRESS NOTE    RAKEB KIBBLE   GYI:[id number]  DOB: Jan 25, 1918  DOA: 05/24/2017 PCP: Janith Lima, MD   Brief Narrative:  Audrey Reed is a 83 year old female who lives alone and has a PMH of Adenocarcinoma of breast, CAD, Dementia, DM, HTN who was found disoriented/confused on the living room floor by her son who checks on her twice a day. Her HR was 15/min. She is on Coreg at home.  She was paced by EMS with Zoll pads and intubated in the ER due to continued somnolence.  Lactic acid 6.10, Temp 96.4 CT head and CXR unrevealing.  Extubated on 10/19 AM.    Subjective: Agrees to go to SNF now.  ROS: no complaints of nausea, vomiting, constipation diarrhea, cough, dyspnea or dysuria. No other complaints.   Assessment & Plan:   Principal Problem:   Bradycardia, sinus, persistent, severe - ? Due to Coreg 12.5 BID - HR has recovered and is in 80-90 range - ECHO shows severe calcific aortic stenosis, severe pulm HTN, mild LVH - as HR has improved with holding B blocker, Cardiology does not recommend further intervention  Active Problems: Acute hypercapnic resp failure in setting of acute metabolic encephalopathy - pH 7.04 and pCO2 53 in ER - extubated 10/19 and quite stable    CKD (chronic kidney disease) stage 3, GFR 30-59 ml/min - stable Cr  Dehydration - improved- stop IVF    Essential hypertension - holding Coreg and lasix  UTI/ leukocytosis - WBC count was 14.2- slightly improved - on Rocephin - started 10/19- culture shows multiple species - today is the last day of treatment    Dementia -  confused to year    DVT prophylaxis: Heparin Code Status: DNR Family Communication:   Disposition Plan: to be determined Consultants:   cardiology Procedures:  2 d ECHO Normal LV systolic function; mild LVH; mild diastolic   dysfunction; calcified aortic valve with severe AS (mean gradient   69 mmHg); mild MR; moderate LAE; mild TR with severely elevated  pulmonary pressure.  Antimicrobials:  Anti-infectives    Start     Dose/Rate Route Frequency Ordered Stop   05/25/17 1400  cefTRIAXone (ROCEPHIN) 1 g in dextrose 5 % 50 mL IVPB     1 g 100 mL/hr over 30 Minutes Intravenous Every 24 hours 05/25/17 1314         Objective: Vitals:   05/26/17 2032 05/27/17 0152 05/27/17 0444 05/27/17 0827  BP: (!) 126/52  (!) 153/72 (!) 170/73  Pulse: 83  77   Resp: 19  20 19   Temp: 98.6 F (37 C)  98.1 F (36.7 C) 98.5 F (36.9 C)  TempSrc: Oral  Oral   SpO2: 90%  92% 92%  Weight: 60.2 kg (132 lb 11.5 oz) 60.2 kg (132 lb 11.5 oz)    Height:        Intake/Output Summary (Last 24 hours) at 05/27/17 1309 Last data filed at 05/27/17 1000  Gross per 24 hour  Intake            717.5 ml  Output                0 ml  Net            717.5 ml   Filed Weights   05/26/17 0203 05/26/17 2032 05/27/17 0152  Weight: 60.3 kg (132 lb 15 oz) 60.2 kg (132 lb 11.5 oz) 60.2 kg (132 lb 11.5 oz)  Examination: General exam: Appears comfortable  HEENT: PERRLA, oral mucosa moist, no sclera icterus or thrush Respiratory system: Clear to auscultation. Respiratory effort normal. Cardiovascular system: S1 & S2 heard, RRR.  2/6 murmur at RUS border Gastrointestinal system: Abdomen soft, non-tender, nondistended. Normal bowel sound. No organomegaly Central nervous system: Alert and oriented x2. No focal neurological deficits. Extremities: No cyanosis, clubbing or edema Skin: No rashes or ulcers Psychiatry:  Mood & affect appropriate.     Data Reviewed: I have personally reviewed following labs and imaging studies  CBC:  Recent Labs Lab 05/24/17 1945 05/25/17 0346 05/26/17 0208 05/27/17 0857  WBC 8.8 14.2* 13.4* 15.8*  NEUTROABS 6.5  --   --   --   HGB 13.2 12.5 12.9 14.0  HCT 41.3 38.6 39.4 42.4  MCV 91.8 90.0 91.6 90.2  PLT 148* 149* 159 160   Basic Metabolic Panel:  Recent Labs Lab 05/24/17 1945 05/25/17 0346 05/26/17 0208 05/27/17 0857    NA 137 139 139 137  K 4.2 3.8 4.1 3.5  CL 101 104 109 111  CO2 22 24 20* 20*  GLUCOSE 303* 178* 120* 156*  BUN 40* 35* 23* 16  CREATININE 1.43* 1.16* 1.09* 1.02*  CALCIUM 8.7* 8.5* 8.5* 8.7*  MG 1.9 2.2  --   --   PHOS  --  2.7  --   --    GFR: Estimated Creatinine Clearance: 26.9 mL/min (A) (by C-G formula based on SCr of 1.02 mg/dL (H)). Liver Function Tests:  Recent Labs Lab 05/24/17 1945 05/25/17 0346  AST 114* 72*  ALT 69* 68*  ALKPHOS 54 50  BILITOT 0.9 0.7  PROT 5.8* 5.8*  ALBUMIN 3.1* 3.4*   No results for input(s): LIPASE, AMYLASE in the last 168 hours.  Recent Labs Lab 05/24/17 1945  AMMONIA 31   Coagulation Profile: No results for input(s): INR, PROTIME in the last 168 hours. Cardiac Enzymes: No results for input(s): CKTOTAL, CKMB, CKMBINDEX, TROPONINI in the last 168 hours. BNP (last 3 results) No results for input(s): PROBNP in the last 8760 hours. HbA1C: No results for input(s): HGBA1C in the last 72 hours. CBG:  Recent Labs Lab 05/25/17 1109 05/25/17 1555 05/25/17 1939 05/25/17 2324 05/26/17 0545  GLUCAP 88 94 103* 121* 117*   Lipid Profile:  Recent Labs  05/24/17 1945  TRIG 64   Thyroid Function Tests: No results for input(s): TSH, T4TOTAL, FREET4, T3FREE, THYROIDAB in the last 72 hours. Anemia Panel: No results for input(s): VITAMINB12, FOLATE, FERRITIN, TIBC, IRON, RETICCTPCT in the last 72 hours. Urine analysis:    Component Value Date/Time   COLORURINE STRAW (A) 05/25/2017 0023   APPEARANCEUR CLEAR 05/25/2017 0023   LABSPEC 1.006 05/25/2017 0023   PHURINE 7.0 05/25/2017 0023   GLUCOSEU 150 (A) 05/25/2017 0023   GLUCOSEU NEGATIVE 10/13/2015 0851   HGBUR SMALL (A) 05/25/2017 0023   BILIRUBINUR NEGATIVE 05/25/2017 0023   BILIRUBINUR neg 10/27/2015 1119   KETONESUR NEGATIVE 05/25/2017 0023   PROTEINUR NEGATIVE 05/25/2017 0023   UROBILINOGEN 0.2 10/27/2015 1119   UROBILINOGEN 0.2 10/13/2015 0851   NITRITE POSITIVE (A)  05/25/2017 0023   LEUKOCYTESUR NEGATIVE 05/25/2017 0023   Sepsis Labs: @LABRCNTIP (procalcitonin:4,lacticidven:4) ) Recent Results (from the past 240 hour(s))  MRSA PCR Screening     Status: None   Collection Time: 05/25/17 12:24 AM  Result Value Ref Range Status   MRSA by PCR NEGATIVE NEGATIVE Final    Comment:        The GeneXpert MRSA Assay (  FDA approved for NASAL specimens only), is one component of a comprehensive MRSA colonization surveillance program. It is not intended to diagnose MRSA infection nor to guide or monitor treatment for MRSA infections.   Culture, blood (routine x 2)     Status: None (Preliminary result)   Collection Time: 05/25/17  3:47 AM  Result Value Ref Range Status   Specimen Description BLOOD RIGHT HAND  Final   Special Requests IN PEDIATRIC BOTTLE Blood Culture adequate volume  Final   Culture NO GROWTH 1 DAY  Final   Report Status PENDING  Incomplete  Culture, blood (routine x 2)     Status: None (Preliminary result)   Collection Time: 05/25/17  3:47 AM  Result Value Ref Range Status   Specimen Description BLOOD RIGHT ANTECUBITAL  Final   Special Requests   Final    BOTTLES DRAWN AEROBIC ONLY Blood Culture adequate volume   Culture NO GROWTH 1 DAY  Final   Report Status PENDING  Incomplete  Culture, Urine     Status: Abnormal   Collection Time: 05/25/17  5:30 PM  Result Value Ref Range Status   Specimen Description URINE, RANDOM  Final   Special Requests NONE  Final   Culture MULTIPLE SPECIES PRESENT, SUGGEST RECOLLECTION (A)  Final   Report Status 05/26/2017 FINAL  Final         Radiology Studies: No results found.    Scheduled Meds: . heparin  5,000 Units Subcutaneous Q8H   Continuous Infusions: . sodium chloride    . sodium chloride 75 mL/hr at 05/26/17 2319  . cefTRIAXone (ROCEPHIN)  IV Stopped (05/26/17 1338)     LOS: 3 days    Time spent in minutes: 35    Audrey Odea, MD [facility] Pager: [email or web address] Password [email or web address] 05/27/2017, 1:09 PM

## 2017-05-28 ENCOUNTER — Inpatient Hospital Stay (HOSPITAL_COMMUNITY): Payer: Medicare Other

## 2017-05-28 DIAGNOSIS — R05 Cough: Secondary | ICD-10-CM

## 2017-05-28 DIAGNOSIS — I350 Nonrheumatic aortic (valve) stenosis: Secondary | ICD-10-CM

## 2017-05-28 DIAGNOSIS — I272 Pulmonary hypertension, unspecified: Secondary | ICD-10-CM

## 2017-05-28 DIAGNOSIS — J9601 Acute respiratory failure with hypoxia: Secondary | ICD-10-CM

## 2017-05-28 DIAGNOSIS — J181 Lobar pneumonia, unspecified organism: Secondary | ICD-10-CM

## 2017-05-28 DIAGNOSIS — J13 Pneumonia due to Streptococcus pneumoniae: Secondary | ICD-10-CM

## 2017-05-28 MED ORDER — HYDRALAZINE HCL 25 MG PO TABS
25.0000 mg | ORAL_TABLET | Freq: Two times a day (BID) | ORAL | Status: AC
Start: 2017-05-28 — End: ?

## 2017-05-28 MED ORDER — LEVOFLOXACIN IN D5W 500 MG/100ML IV SOLN
500.0000 mg | INTRAVENOUS | Status: DC
  Administered 2017-05-28: 500 mg via INTRAVENOUS
  Filled 2017-05-28: qty 100

## 2017-05-28 NOTE — Discharge Summary (Signed)
Physician Discharge Summary  Audrey Reed ERD:[id number] DOB: 1918-06-19 DOA: 05/24/2017  PCP: Janith Lima, MD  Admit date: 05/24/2017 Discharge date: 05/29/2017  Admitted From: home Disposition:  SNF   Recommendations for Outpatient Follow-up:  1. Cont Levaquin for a minimum of 5 days- next dose is tomorrow as it is QOD  Discharge Condition:  stable   CODE STATUS:  DNR   Consultations:  Cardiology     Discharge Diagnoses:  Principal Problem:   Bradycardia, sinus, persistent, severe Active Problems:  Dehydration   Acute lower UTI   HCAP    Essential hypertension   Ventilator dependent (HCC)   Pressure injury of skin   Aortic stenosis- severe  Severe pulmonary HTN   CKD (chronic kidney disease) stage 3, GFR 30-59 ml/min (HCC)   Subjective: No complaints today.   Brief Summary: Audrey Reed is a 83 year old female who lives alone and has a PMH of Adenocarcinoma of breast, CAD, Dementia, DM, HTN who was found disoriented/confused on the living room floor by her son who checks on her twice a day. Her HR was 15/min. She is on Coreg at home.  She was paced by EMS with Zoll pads and intubated in the ER due to continued somnolence.  Lactic acid 6.10, Temp 96.4 CT head and CXR unrevealing.  Extubated on 10/19 AM.   Hospital Course:  Principal Problem:   Bradycardia, sinus, persistent, severe - ? Due to Coreg 12.5 BID - HR has recovered and is in 80-90 range - ECHO shows severe calcific aortic stenosis, severe pulm HTN, mild LVH - as HR has improved with holding B blocker, Cardiology does not recommend further intervention  Active Problems: Severe Ao stenosis/ severe pulm HTN - avoid dropping preload- not a candidate for surgery- discussed with patient and son Lucile Hillmann  Cough/ leukocytosis >> pneumonia - WBC count did not improve with Rocephin but actually slightly trended up - 10/22- noted to have a cough and CXR suggestive of infiltrate and therefore  started Levaquin - leukocytosis improved- does not sound very congested on exam today- would recommend at least 5 days of treatment  Acute hypercapnic resp failure in setting of acute metabolic encephalopathy - pH 7.04 and pCO2 53 in ER - extubated 10/19 and quite stable  Dehydration - AKI on CKD (chronic kidney disease) stage 3, GFR 30-59 ml/min - improved- stopped IVF - BUN 40 and Cr 1.43 on admission improved to 14 and 0.96 today    Essential hypertension - holding Coreg and lasix- started Hydralazine- titrate up as outpt as needed  UTI/ leukocytosis - WBC count was 14.2>> 15.2  - givenRocephin - started 10/19- culture shows multiple species -  Treatment completed on 10/21  - note: started Levaquin for  pneumonia yesterday- WBC has normalized today   Discharge Instructions  Discharge Instructions    Diet - low sodium heart healthy    Complete by:  As directed    Diet - low sodium heart healthy    Complete by:  As directed    Increase activity slowly    Complete by:  As directed    Increase activity slowly    Complete by:  As directed      Allergies as of 05/29/2017      Reactions   Metformin And Related    diarrhea   Nitrofurantoin    REACTION: unsure of reaction--happened long ago   Tramadol Nausea Only   Codeine Nausea And Vomiting, Swelling, Rash  Medication List    STOP taking these medications   carvedilol 12.5 MG tablet Commonly known as:  COREG   furosemide 40 MG tablet Commonly known as:  LASIX     TAKE these medications   acetaminophen 325 MG tablet Commonly known as:  TYLENOL Take 2 tablets (650 mg total) by mouth every 6 (six) hours as needed (or Fever >/= 101).   CERTAVITE SENIOR/ANTIOXIDANT Tabs Take by mouth.   feeding supplement (ENSURE) Pudg Take 1 Container by mouth 3 (three) times daily between meals.   ferrous sulfate 325 (65 FE) MG tablet take 1 tablet by mouth twice a day with meals   fluticasone 50 MCG/ACT nasal  spray Commonly known as:  FLONASE Place 2 sprays into both nostrils daily.   hydrALAZINE 25 MG tablet Commonly known as:  APRESOLINE Take 1 tablet (25 mg total) by mouth 2 (two) times daily.   levofloxacin 500 MG tablet Commonly known as:  LEVAQUIN Take 1 tablet (500 mg total) by mouth every other day.   saccharomyces boulardii 250 MG capsule Commonly known as:  FLORASTOR Take 1 capsule (250 mg total) by mouth daily.   simvastatin 20 MG tablet Commonly known as:  ZOCOR Take 1 tablet (20 mg total) by mouth at bedtime.   Vitamin D3 1000 units Caps Take 2 capsules (2,000 Units total) by mouth daily.       Allergies  Allergen Reactions  . Metformin And Related     diarrhea  . Nitrofurantoin     REACTION: unsure of reaction--happened long ago  . Tramadol Nausea Only  . Codeine Nausea And Vomiting, Swelling and Rash     Procedures/Studies: 2 d ECHO Normal LV systolic function; mild LVH; mild diastolic dysfunction; calcified aortic valve with severe AS (mean gradient 69 mmHg); mild MR; moderate LAE; mild TR with severely elevated pulmonary pressure.  Ct Head Wo Contrast  Result Date: 05/24/2017 CLINICAL DATA:  Altered level of consciousness. EXAM: CT HEAD WITHOUT CONTRAST TECHNIQUE: Contiguous axial images were obtained from the base of the skull through the vertex without intravenous contrast. COMPARISON:  11/03/2012 FINDINGS: Brain: No evidence of acute infarction, hemorrhage, hydrocephalus, extra-axial collection, or mass lesion/mass effect. Stable mild to moderate cerebral atrophy and chronic small vessel disease. Vascular:  No hyperdense vessel or other acute findings. Skull: No evidence of fracture or other significant bone abnormality. Sinuses/Orbits:  No acute findings. Other: None. IMPRESSION: No acute intracranial abnormality. Cerebral atrophy and chronic small vessel disease. Electronically Signed   By: Earle Gell M.D.   On: 05/24/2017 20:25   Dg Chest Port  1 View  Result Date: 05/28/2017 CLINICAL DATA:  Cough. EXAM: PORTABLE CHEST 1 VIEW COMPARISON:  05/25/2017 FINDINGS: Endotracheal and enteric tubes have been removed. The cardiomediastinal silhouette is unchanged. Extensive aortic atherosclerosis is noted. Lung volumes are diminished compared to the prior study with patchy left basilar airspace opacity and possible small pleural effusion. Milder right basilar opacity is also present. No pneumothorax is seen. IMPRESSION: Interval extubation. Low lung volumes with left greater than right basilar opacities which may reflect atelectasis or pneumonia. Possible small left pleural effusion. Electronically Signed   By: Logan Bores M.D.   On: 05/28/2017 12:55   Dg Chest Port 1 View  Result Date: 05/25/2017 CLINICAL DATA:  Intubation. EXAM: PORTABLE CHEST 1 VIEW COMPARISON:  05/24/2017. FINDINGS: Endotracheal tube and NG tube in stable position. Heart size normal. Low lung volumes . No focal infiltrate. No prominent pleural effusion. No pneumothorax. IMPRESSION: 1.  Endotracheal tube and NG tube in stable position. 2. Low lung volumes. Electronically Signed   By: Marcello Moores  Register   On: 05/25/2017 06:42   Dg Chest Portable 1 View  Result Date: 05/24/2017 CLINICAL DATA:  Post intubation EXAM: PORTABLE CHEST 1 VIEW COMPARISON:  03/21/2014 FINDINGS: Endotracheal tube tip is about 1.7 cm superior to the carina. Esophageal tube tip is below the diaphragm. Diffuse coarse interstitial opacity likely chronic. No consolidation or effusion. Stable cardiomediastinal silhouette with atherosclerosis. No pneumothorax. IMPRESSION: Endotracheal tube tip about 1.7 cm superior to carina Probable coarse chronic interstitial opacity. No edema or consolidation. Electronically Signed   By: Donavan Foil M.D.   On: 05/24/2017 20:08       Discharge Exam: Vitals:   05/29/17 0600 05/29/17 0900  BP: (!) 155/67 (!) 166/73  Pulse: 77 79  Resp: 18 18  Temp: 98.2 F (36.8 C) 98 F  (36.7 C)  SpO2: 98% 99%   Vitals:   05/28/17 1756 05/28/17 2048 05/29/17 0600 05/29/17 0900  BP: (!) 151/65 (!) 131/46 (!) 155/67 (!) 166/73  Pulse: 64 74 77 79  Resp: 18 18 18 18   Temp: 97.8 F (36.6 C) 98.4 F (36.9 C) 98.2 F (36.8 C) 98 F (36.7 C)  TempSrc: Oral Oral Oral Oral  SpO2: 100% 99% 98% 99%  Weight:  59.9 kg (132 lb)    Height:        General: frail elderly lady who is very hard of hearing- Pt is alert, awake, not in acute distress Cardiovascular: RRR, S1/S2 +, no rubs, no gallops Respiratory: mild rhonchi in left lung field, no wheezing, no rhonchi Abdominal: Soft, NT, ND, bowel sounds + Extremities: no edema, no cyanosis   The results of significant diagnostics from this hospitalization (including imaging, microbiology, ancillary and laboratory) are listed below for reference.     Microbiology: Recent Results (from the past 240 hour(s))  MRSA PCR Screening     Status: None   Collection Time: 05/25/17 12:24 AM  Result Value Ref Range Status   MRSA by PCR NEGATIVE NEGATIVE Final    Comment:        The GeneXpert MRSA Assay (FDA approved for NASAL specimens only), is one component of a comprehensive MRSA colonization surveillance program. It is not intended to diagnose MRSA infection nor to guide or monitor treatment for MRSA infections.   Culture, blood (routine x 2)     Status: None (Preliminary result)   Collection Time: 05/25/17  3:47 AM  Result Value Ref Range Status   Specimen Description BLOOD RIGHT HAND  Final   Special Requests IN PEDIATRIC BOTTLE Blood Culture adequate volume  Final   Culture NO GROWTH 3 DAYS  Final   Report Status PENDING  Incomplete  Culture, blood (routine x 2)     Status: None (Preliminary result)   Collection Time: 05/25/17  3:47 AM  Result Value Ref Range Status   Specimen Description BLOOD RIGHT ANTECUBITAL  Final   Special Requests   Final    BOTTLES DRAWN AEROBIC ONLY Blood Culture adequate volume   Culture  NO GROWTH 3 DAYS  Final   Report Status PENDING  Incomplete  Culture, Urine     Status: Abnormal   Collection Time: 05/25/17  5:30 PM  Result Value Ref Range Status   Specimen Description URINE, RANDOM  Final   Special Requests NONE  Final   Culture MULTIPLE SPECIES PRESENT, SUGGEST RECOLLECTION (A)  Final   Report Status 05/26/2017 FINAL  Final     Labs: BNP (last 3 results) No results for input(s): BNP in the last 8760 hours. Basic Metabolic Panel:  Recent Labs Lab 05/24/17 1945 05/25/17 0346 05/26/17 0208 05/27/17 0857 05/29/17 0235  NA 137 139 139 137 140  K 4.2 3.8 4.1 3.5 3.7  CL 101 104 109 111 109  CO2 22 24 20* 20* 24  GLUCOSE 303* 178* 120* 156* 118*  BUN 40* 35* 23* 16 15  CREATININE 1.43* 1.16* 1.09* 1.02* 0.96  CALCIUM 8.7* 8.5* 8.5* 8.7* 8.4*  MG 1.9 2.2  --   --   --   PHOS  --  2.7  --   --   --    Liver Function Tests:  Recent Labs Lab 05/24/17 1945 05/25/17 0346  AST 114* 72*  ALT 69* 68*  ALKPHOS 54 50  BILITOT 0.9 0.7  PROT 5.8* 5.8*  ALBUMIN 3.1* 3.4*   No results for input(s): LIPASE, AMYLASE in the last 168 hours.  Recent Labs Lab 05/24/17 1945  AMMONIA 31   CBC:  Recent Labs Lab 05/24/17 1945 05/25/17 0346 05/26/17 0208 05/27/17 0857 05/29/17 0235  WBC 8.8 14.2* 13.4* 15.8* 9.1  NEUTROABS 6.5  --   --   --   --   HGB 13.2 12.5 12.9 14.0 12.4  HCT 41.3 38.6 39.4 42.4 37.8  MCV 91.8 90.0 91.6 90.2 89.2  PLT 148* 149* 159 173 163   Cardiac Enzymes: No results for input(s): CKTOTAL, CKMB, CKMBINDEX, TROPONINI in the last 168 hours. BNP: Invalid input(s): POCBNP CBG:  Recent Labs Lab 05/25/17 1109 05/25/17 1555 05/25/17 1939 05/25/17 2324 05/26/17 0545  GLUCAP 88 94 103* 121* 117*   D-Dimer No results for input(s): DDIMER in the last 72 hours. Hgb A1c No results for input(s): HGBA1C in the last 72 hours. Lipid Profile No results for input(s): CHOL, HDL, LDLCALC, TRIG, CHOLHDL, LDLDIRECT in the last 72  hours. Thyroid function studies No results for input(s): TSH, T4TOTAL, T3FREE, THYROIDAB in the last 72 hours.  Invalid input(s): FREET3 Anemia work up No results for input(s): VITAMINB12, FOLATE, FERRITIN, TIBC, IRON, RETICCTPCT in the last 72 hours. Urinalysis    Component Value Date/Time   COLORURINE STRAW (A) 05/25/2017 0023   APPEARANCEUR CLEAR 05/25/2017 0023   LABSPEC 1.006 05/25/2017 0023   PHURINE 7.0 05/25/2017 0023   GLUCOSEU 150 (A) 05/25/2017 0023   GLUCOSEU NEGATIVE 10/13/2015 0851   HGBUR SMALL (A) 05/25/2017 0023   BILIRUBINUR NEGATIVE 05/25/2017 0023   BILIRUBINUR neg 10/27/2015 1119   KETONESUR NEGATIVE 05/25/2017 0023   PROTEINUR NEGATIVE 05/25/2017 0023   UROBILINOGEN 0.2 10/27/2015 1119   UROBILINOGEN 0.2 10/13/2015 0851   NITRITE POSITIVE (A) 05/25/2017 0023   LEUKOCYTESUR NEGATIVE 05/25/2017 0023   Sepsis Labs Invalid input(s): PROCALCITONIN,  WBC,  LACTICIDVEN Microbiology Recent Results (from the past 240 hour(s))  MRSA PCR Screening     Status: None   Collection Time: 05/25/17 12:24 AM  Result Value Ref Range Status   MRSA by PCR NEGATIVE NEGATIVE Final    Comment:        The GeneXpert MRSA Assay (FDA approved for NASAL specimens only), is one component of a comprehensive MRSA colonization surveillance program. It is not intended to diagnose MRSA infection nor to guide or monitor treatment for MRSA infections.   Culture, blood (routine x 2)     Status: None (Preliminary result)   Collection Time: 05/25/17  3:47 AM  Result Value Ref Range Status  Specimen Description BLOOD RIGHT HAND  Final   Special Requests IN PEDIATRIC BOTTLE Blood Culture adequate volume  Final   Culture NO GROWTH 3 DAYS  Final   Report Status PENDING  Incomplete  Culture, blood (routine x 2)     Status: None (Preliminary result)   Collection Time: 05/25/17  3:47 AM  Result Value Ref Range Status   Specimen Description BLOOD RIGHT ANTECUBITAL  Final   Special  Requests   Final    BOTTLES DRAWN AEROBIC ONLY Blood Culture adequate volume   Culture NO GROWTH 3 DAYS  Final   Report Status PENDING  Incomplete  Culture, Urine     Status: Abnormal   Collection Time: 05/25/17  5:30 PM  Result Value Ref Range Status   Specimen Description URINE, RANDOM  Final   Special Requests NONE  Final   Culture MULTIPLE SPECIES PRESENT, SUGGEST RECOLLECTION (A)  Final   Report Status 05/26/2017 FINAL  Final     Time coordinating discharge: Over 30 minutes  SIGNED:   Debbe Odea, MD  [facility] 05/29/2017, 12:17 PM Pager   If 7PM-7AM, please contact night-coverage [email or web address] Password [email or web address]

## 2017-05-28 NOTE — Progress Notes (Signed)
Physical Therapy Treatment Patient Details Name: Audrey Reed MRN: [id number] DOB: 29-May-1918 Today's Date: 05/28/2017    History of Present Illness Pt is a 83 y/o female admitted after being found down at home, disoriented and confused. Pt found to be in profound bradycardia with respiratory insufficiency in the setting of encephalopathy and was intubated 10/18-10/19. PMH including but not limited to dementia, CKD, HTN and hx of CVA.    PT Comments    Continuing work on functional mobility and activity tolerance;  More fatigued today, and agreeable to OOB to chair only; satting well on 2L supplemental O2; discussed theimprotance of getting OOB to chair daily , and gradually increasing activity  Follow Up Recommendations  SNF;Supervision/Assistance - 24 hour;Other (comment) (if pt refuses, will need 24/7 supervision, HHPT, [place] aide)     Equipment Recommendations  None recommended by PT    Recommendations for Other Services       Precautions / Restrictions Precautions Precautions: Fall    Mobility  Bed Mobility Overal bed mobility: Needs Assistance Bed Mobility: Supine to Sit     Supine to sit: Supervision     General bed mobility comments: increased time and effort, supervision for safety  Transfers Overall transfer level: Needs assistance Equipment used: Rolling walker (2 wheeled) Transfers: Sit to/from Stand Sit to Stand: Min assist         General transfer comment: increased time and effort; pt required 2 attempts with use of momentum before successfully achieving full standing position on third attempt with min A for stability  Ambulation/Gait Ambulation/Gait assistance: Min guard Ambulation Distance (Feet):  (Pivotal steps bed to chair) Assistive device: Rolling walker (2 wheeled) Gait Pattern/deviations: Trunk flexed;Decreased stride length;Decreased step length - left;Decreased step length - right;Step-through pattern     General Gait Details: cues for  upright posture and to self-monitor for activity tolerance; no gross losses of balance   Stairs            Wheelchair Mobility    Modified Rankin (Stroke Patients Only)       Balance     Sitting balance-Leahy Scale: Fair       Standing balance-Leahy Scale: Poor                              Cognition Arousal/Alertness: Awake/alert Behavior During Therapy: WFL for tasks assessed/performed Overall Cognitive Status: History of cognitive impairments - at baseline                                        Exercises      General Comments General comments (skin integrity, edema, etc.): On 2 L supplemental O2; sats remained greater tahn or equal to 96%1      Pertinent Vitals/Pain Pain Assessment: No/denies pain    Home Living                      Prior Function            PT Goals (current goals can now be found in the care plan section) Acute Rehab PT Goals Patient Stated Goal: none stated PT Goal Formulation: Patient unable to participate in goal setting Time For Goal Achievement: 06/09/17 Potential to Achieve Goals: Fair Progress towards PT goals: Progressing toward goals    Frequency    Min 3X/week  PT Plan Current plan remains appropriate    Co-evaluation              AM-PAC PT "6 Clicks" Daily Activity  Outcome Measure  Difficulty turning over in bed (including adjusting bedclothes, sheets and blankets)?: A Little Difficulty moving from lying on back to sitting on the side of the bed? : A Little Difficulty sitting down on and standing up from a chair with arms (e.g., wheelchair, bedside commode, etc,.)?: Unable Help needed moving to and from a bed to chair (including a wheelchair)?: A Little Help needed walking in hospital room?: A Little Help needed climbing 3-5 steps with a railing? : A Lot 6 Click Score: 15    End of Session Equipment Utilized During Treatment: Gait belt;Oxygen Activity  Tolerance: Patient tolerated treatment well Patient left: in chair;with call bell/phone within reach;with family/visitor present Nurse Communication: Mobility status PT Visit Diagnosis: Other abnormalities of gait and mobility (R26.89);Unsteadiness on feet (R26.81)     Time: 4818-5909 PT Time Calculation (min) (ACUTE ONLY): 21 min  Charges:  $Therapeutic Activity: 8-22 mins                    G Codes:       Roney Marion, PT  Acute Rehabilitation Services Pager [phone] Office [phone]    Colletta [place] 05/28/2017, 4:38 PM

## 2017-05-28 NOTE — Care Management Important Message (Signed)
Important Message  Patient Details  Name: Audrey Reed MRN: [id number] Date of Birth: [birth year]   Medicare Important Message Given:  Yes    Fey Coghill Abena 05/28/2017, 10:15 AM

## 2017-05-28 NOTE — Progress Notes (Signed)
Pharmacy Antibiotic Note  Audrey Reed is a 83 y.o. female admitted on 05/24/2017 with pneumonia.  Pharmacy has been consulted for levaquin dosing. Previously on ceftriaxone x 3 days. MD called to explain reasoning behind Levaquin use in this patient, aware of risks and side effect possibilities and states will keep patient here another day while on therapy. Noted - Qtc 515 on 10/18.  Afebrile, WBC up 15.8. SCr down 1.02. UC with multiple species, BCx negative to date.  Plan: Levaquin 500mg  IV q48h Monitor clinical progress, c/s, renal function F/u de-escalation plan/LOT   Height: 5\' 2"  (157.5 cm) Weight: 132 lb 7.9 oz (60.1 kg) IBW/kg (Calculated) : 50.1  Temp (24hrs), Avg:98.2 F (36.8 C), Min:97.3 F (36.3 C), Max:98.6 F (37 C)   Recent Labs Lab 05/24/17 1945 05/24/17 1951 05/25/17 0346 05/26/17 0208 05/27/17 0857  WBC 8.8  --  14.2* 13.4* 15.8*  CREATININE 1.43*  --  1.16* 1.09* 1.02*  LATICACIDVEN  --  6.10* 1.3  --   --     Estimated Creatinine Clearance: 24.9 mL/min (A) (by C-G formula based on SCr of 1.02 mg/dL (H)).    Allergies  Allergen Reactions  . Metformin And Related     diarrhea  . Nitrofurantoin     REACTION: unsure of reaction--happened long ago  . Tramadol Nausea Only  . Codeine Nausea And Vomiting, Swelling and Rash    Elicia Lamp, PharmD, BCPS Clinical Pharmacist Rx Phone # for today: [phone] After 3:30PM, please call Main Rx: #[id number] 05/28/2017 1:40 PM  05/28/2017 1:39 PM

## 2017-05-28 NOTE — Progress Notes (Signed)
PROGRESS NOTE    Audrey Reed   GGY:[id number]  DOB: June 07, 1918  DOA: 05/24/2017 PCP: Janith Lima, MD   Brief Narrative:  Audrey Reed is a 83 year old female who lives alone and has a PMH of Adenocarcinoma of breast, CAD, Dementia, DM, HTN who was found disoriented/confused on the living room floor by her son who checks on her twice a day. Her HR was 15/min. She is on Coreg at home.  She was paced by EMS with Zoll pads and intubated in the ER due to continued somnolence.  Lactic acid 6.10, Temp 96.4 CT head and CXR unrevealing.  Extubated on 10/19 AM.    Subjective: Noted to be coughing today. She states she has been coughing for a couple of days. She has sputum but is not spitting it out but swallowing.  ROS: no complaints of nausea, vomiting, constipation diarrhea, dyspnea or dysuria. No other complaints.   Assessment & Plan:   Principal Problem:   Bradycardia, sinus, persistent, severe - ? Due to Coreg 12.5 BID - HR has recovered and is in 80-90 range - ECHO shows severe calcific aortic stenosis, severe pulm HTN, mild LVH - as HR has improved with holding B blocker, Cardiology does not recommend further intervention  Active Problems: Severe Ao stenosis/ severe pulm HTN - avoid dropping preload- not a candidate for surgery- discussed with patient and son [name]  Cough/ leukocytosis - WBC count did not improve with Rocephin - has a cough and CXR suggestive of infiltrate- will start Levaquin and follow   Acute hypercapnic resp failure in setting of acute metabolic encephalopathy - pH 7.04 and pCO2 53 in ER - extubated 10/19 and quite stable    CKD (chronic kidney disease) stage 3, GFR 30-59 ml/min - stable Cr  Dehydration - improved- stop IVF    Essential hypertension - holding Coreg and lasix  UTI/ leukocytosis - WBC count was 14.2- slightly improved - on Rocephin - started 10/19 and completed on 10/21 - culture shows multiple species and is not  helpful    Dementia? - noted on chart -  confused to year but son states she does not have a history of dementia    DVT prophylaxis: Heparin Code Status: DNR Family Communication:   Disposition Plan: to be determined Consultants:   cardiology Procedures:  2 d ECHO Normal LV systolic function; mild LVH; mild diastolic   dysfunction; calcified aortic valve with severe AS (mean gradient   69 mmHg); mild MR; moderate LAE; mild TR with severely elevated   pulmonary pressure.  Antimicrobials:  Anti-infectives    Start     Dose/Rate Route Frequency Ordered Stop   05/27/17 1400  cefTRIAXone (ROCEPHIN) 1 g in dextrose 5 % 50 mL IVPB     1 g 100 mL/hr over 30 Minutes Intravenous Every 24 hours 05/27/17 1314 05/27/17 1511   05/25/17 1400  cefTRIAXone (ROCEPHIN) 1 g in dextrose 5 % 50 mL IVPB  Status:  Discontinued     1 g 100 mL/hr over 30 Minutes Intravenous Every 24 hours 05/25/17 1314 05/27/17 1314       Objective: Vitals:   05/27/17 2111 05/28/17 0414 05/28/17 0500 05/28/17 1000  BP: 130/89 (!) 152/70  (!) 145/57  Pulse: 71 72  75  Resp: 19 20  15   Temp: 98.6 F (37 C) 98.5 F (36.9 C)  (!) 97.3 F (36.3 C)  TempSrc: Oral Oral  Oral  SpO2: 98% 98%  98%  Weight: 60.1 kg (132 lb 7.9 oz)  60.1 kg (132 lb 7.9 oz)   Height:        Intake/Output Summary (Last 24 hours) at 05/28/17 1339 Last data filed at 05/28/17 0636  Gross per 24 hour  Intake              240 ml  Output              700 ml  Net             -460 ml   Filed Weights   05/27/17 0152 05/27/17 2111 05/28/17 0500  Weight: 60.2 kg (132 lb 11.5 oz) 60.1 kg (132 lb 7.9 oz) 60.1 kg (132 lb 7.9 oz)    Examination: General exam: Appears comfortable  HEENT: PERRLA, oral mucosa moist, no sclera icterus or thrush Respiratory system: Clear to auscultation. Congested/ coughing. Respiratory effort normal. Cardiovascular system: S1 & S2 heard, RRR.  2/6 murmur at RUS border Gastrointestinal system: Abdomen soft,  non-tender, nondistended. Normal bowel sound. No organomegaly Central nervous system: Alert and oriented x2. No focal neurological deficits. Extremities: No cyanosis, clubbing or edema Skin: No rashes or ulcers Psychiatry:  Mood & affect appropriate.     Data Reviewed: I have personally reviewed following labs and imaging studies  CBC:  Recent Labs Lab 05/24/17 1945 05/25/17 0346 05/26/17 0208 05/27/17 0857  WBC 8.8 14.2* 13.4* 15.8*  NEUTROABS 6.5  --   --   --   HGB 13.2 12.5 12.9 14.0  HCT 41.3 38.6 39.4 42.4  MCV 91.8 90.0 91.6 90.2  PLT 148* 149* 159 858   Basic Metabolic Panel:  Recent Labs Lab 05/24/17 1945 05/25/17 0346 05/26/17 0208 05/27/17 0857  NA 137 139 139 137  K 4.2 3.8 4.1 3.5  CL 101 104 109 111  CO2 22 24 20* 20*  GLUCOSE 303* 178* 120* 156*  BUN 40* 35* 23* 16  CREATININE 1.43* 1.16* 1.09* 1.02*  CALCIUM 8.7* 8.5* 8.5* 8.7*  MG 1.9 2.2  --   --   PHOS  --  2.7  --   --    GFR: Estimated Creatinine Clearance: 24.9 mL/min (A) (by C-G formula based on SCr of 1.02 mg/dL (H)). Liver Function Tests:  Recent Labs Lab 05/24/17 1945 05/25/17 0346  AST 114* 72*  ALT 69* 68*  ALKPHOS 54 50  BILITOT 0.9 0.7  PROT 5.8* 5.8*  ALBUMIN 3.1* 3.4*   No results for input(s): LIPASE, AMYLASE in the last 168 hours.  Recent Labs Lab 05/24/17 1945  AMMONIA 31   Coagulation Profile: No results for input(s): INR, PROTIME in the last 168 hours. Cardiac Enzymes: No results for input(s): CKTOTAL, CKMB, CKMBINDEX, TROPONINI in the last 168 hours. BNP (last 3 results) No results for input(s): PROBNP in the last 8760 hours. HbA1C: No results for input(s): HGBA1C in the last 72 hours. CBG:  Recent Labs Lab 05/25/17 1109 05/25/17 1555 05/25/17 1939 05/25/17 2324 05/26/17 0545  GLUCAP 88 94 103* 121* 117*   Lipid Profile: No results for input(s): CHOL, HDL, LDLCALC, TRIG, CHOLHDL, LDLDIRECT in the last 72 hours. Thyroid Function Tests: No  results for input(s): TSH, T4TOTAL, FREET4, T3FREE, THYROIDAB in the last 72 hours. Anemia Panel: No results for input(s): VITAMINB12, FOLATE, FERRITIN, TIBC, IRON, RETICCTPCT in the last 72 hours. Urine analysis:    Component Value Date/Time   COLORURINE STRAW (A) 05/25/2017 0023   APPEARANCEUR CLEAR 05/25/2017 0023   LABSPEC 1.006 05/25/2017 0023  PHURINE 7.0 05/25/2017 0023   GLUCOSEU 150 (A) 05/25/2017 0023   GLUCOSEU NEGATIVE 10/13/2015 0851   HGBUR SMALL (A) 05/25/2017 0023   BILIRUBINUR NEGATIVE 05/25/2017 0023   BILIRUBINUR neg 10/27/2015 1119   KETONESUR NEGATIVE 05/25/2017 0023   PROTEINUR NEGATIVE 05/25/2017 0023   UROBILINOGEN 0.2 10/27/2015 1119   UROBILINOGEN 0.2 10/13/2015 0851   NITRITE POSITIVE (A) 05/25/2017 0023   LEUKOCYTESUR NEGATIVE 05/25/2017 0023   Sepsis Labs: @LABRCNTIP (procalcitonin:4,lacticidven:4) ) Recent Results (from the past 240 hour(s))  MRSA PCR Screening     Status: None   Collection Time: 05/25/17 12:24 AM  Result Value Ref Range Status   MRSA by PCR NEGATIVE NEGATIVE Final    Comment:        The GeneXpert MRSA Assay (FDA approved for NASAL specimens only), is one component of a comprehensive MRSA colonization surveillance program. It is not intended to diagnose MRSA infection nor to guide or monitor treatment for MRSA infections.   Culture, blood (routine x 2)     Status: None (Preliminary result)   Collection Time: 05/25/17  3:47 AM  Result Value Ref Range Status   Specimen Description BLOOD RIGHT HAND  Final   Special Requests IN PEDIATRIC BOTTLE Blood Culture adequate volume  Final   Culture NO GROWTH 3 DAYS  Final   Report Status PENDING  Incomplete  Culture, blood (routine x 2)     Status: None (Preliminary result)   Collection Time: 05/25/17  3:47 AM  Result Value Ref Range Status   Specimen Description BLOOD RIGHT ANTECUBITAL  Final   Special Requests   Final    BOTTLES DRAWN AEROBIC ONLY Blood Culture adequate volume     Culture NO GROWTH 3 DAYS  Final   Report Status PENDING  Incomplete  Culture, Urine     Status: Abnormal   Collection Time: 05/25/17  5:30 PM  Result Value Ref Range Status   Specimen Description URINE, RANDOM  Final   Special Requests NONE  Final   Culture MULTIPLE SPECIES PRESENT, SUGGEST RECOLLECTION (A)  Final   Report Status 05/26/2017 FINAL  Final         Radiology Studies: Dg Chest Port 1 View  Result Date: 05/28/2017 CLINICAL DATA:  Cough. EXAM: PORTABLE CHEST 1 VIEW COMPARISON:  05/25/2017 FINDINGS: Endotracheal and enteric tubes have been removed. The cardiomediastinal silhouette is unchanged. Extensive aortic atherosclerosis is noted. Lung volumes are diminished compared to the prior study with patchy left basilar airspace opacity and possible small pleural effusion. Milder right basilar opacity is also present. No pneumothorax is seen. IMPRESSION: Interval extubation. Low lung volumes with left greater than right basilar opacities which may reflect atelectasis or pneumonia. Possible small left pleural effusion. Electronically Signed   By: Logan Bores M.D.   On: 05/28/2017 12:55      Scheduled Meds: . feeding supplement (ENSURE ENLIVE)  237 mL Oral BID BM  . heparin  5,000 Units Subcutaneous Q8H  . hydrALAZINE  25 mg Oral BID  . simvastatin  20 mg Oral QHS   Continuous Infusions: . sodium chloride       LOS: 4 days    Time spent in minutes: 35    Debbe Odea, MD [facility] Pager: [email or web address] Password [email or web address] 05/28/2017, 1:39 PM

## 2017-05-29 ENCOUNTER — Encounter (HOSPITAL_COMMUNITY): Payer: Self-pay | Admitting: General Practice

## 2017-05-29 DIAGNOSIS — Z23 Encounter for immunization: Secondary | ICD-10-CM | POA: Diagnosis not present

## 2017-05-29 DIAGNOSIS — Y95 Nosocomial condition: Secondary | ICD-10-CM | POA: Diagnosis not present

## 2017-05-29 DIAGNOSIS — I499 Cardiac arrhythmia, unspecified: Secondary | ICD-10-CM | POA: Diagnosis not present

## 2017-05-29 DIAGNOSIS — N183 Chronic kidney disease, stage 3 (moderate): Secondary | ICD-10-CM | POA: Diagnosis not present

## 2017-05-29 DIAGNOSIS — R059 Cough, unspecified: Secondary | ICD-10-CM

## 2017-05-29 DIAGNOSIS — R2681 Unsteadiness on feet: Secondary | ICD-10-CM | POA: Diagnosis not present

## 2017-05-29 DIAGNOSIS — Z9911 Dependence on respirator [ventilator] status: Secondary | ICD-10-CM | POA: Diagnosis not present

## 2017-05-29 DIAGNOSIS — R41841 Cognitive communication deficit: Secondary | ICD-10-CM | POA: Diagnosis not present

## 2017-05-29 DIAGNOSIS — S31000A Unspecified open wound of lower back and pelvis without penetration into retroperitoneum, initial encounter: Secondary | ICD-10-CM | POA: Diagnosis not present

## 2017-05-29 DIAGNOSIS — R278 Other lack of coordination: Secondary | ICD-10-CM | POA: Diagnosis not present

## 2017-05-29 DIAGNOSIS — R05 Cough: Secondary | ICD-10-CM

## 2017-05-29 DIAGNOSIS — J9601 Acute respiratory failure with hypoxia: Secondary | ICD-10-CM | POA: Diagnosis not present

## 2017-05-29 DIAGNOSIS — M6281 Muscle weakness (generalized): Secondary | ICD-10-CM | POA: Diagnosis not present

## 2017-05-29 DIAGNOSIS — I272 Pulmonary hypertension, unspecified: Secondary | ICD-10-CM

## 2017-05-29 DIAGNOSIS — N390 Urinary tract infection, site not specified: Secondary | ICD-10-CM | POA: Diagnosis not present

## 2017-05-29 DIAGNOSIS — I469 Cardiac arrest, cause unspecified: Secondary | ICD-10-CM | POA: Diagnosis not present

## 2017-05-29 DIAGNOSIS — J9690 Respiratory failure, unspecified, unspecified whether with hypoxia or hypercapnia: Secondary | ICD-10-CM | POA: Diagnosis not present

## 2017-05-29 DIAGNOSIS — J188 Other pneumonia, unspecified organism: Secondary | ICD-10-CM | POA: Diagnosis not present

## 2017-05-29 DIAGNOSIS — R001 Bradycardia, unspecified: Secondary | ICD-10-CM | POA: Diagnosis not present

## 2017-05-29 DIAGNOSIS — I10 Essential (primary) hypertension: Secondary | ICD-10-CM | POA: Diagnosis not present

## 2017-05-29 DIAGNOSIS — I350 Nonrheumatic aortic (valve) stenosis: Secondary | ICD-10-CM | POA: Diagnosis not present

## 2017-05-29 LAB — CBC
HCT: 37.8 % (ref 36.0–46.0)
Hemoglobin: 12.4 g/dL (ref 12.0–15.0)
MCH: 29.2 pg (ref 26.0–34.0)
MCHC: 32.8 g/dL (ref 30.0–36.0)
MCV: 89.2 fL (ref 78.0–100.0)
PLATELETS: 163 10*3/uL (ref 150–400)
RBC: 4.24 MIL/uL (ref 3.87–5.11)
RDW: 15.6 % — AB (ref 11.5–15.5)
WBC: 9.1 10*3/uL (ref 4.0–10.5)

## 2017-05-29 LAB — BASIC METABOLIC PANEL
Anion gap: 7 (ref 5–15)
BUN: 15 mg/dL (ref 6–20)
CALCIUM: 8.4 mg/dL — AB (ref 8.9–10.3)
CO2: 24 mmol/L (ref 22–32)
CREATININE: 0.96 mg/dL (ref 0.44–1.00)
Chloride: 109 mmol/L (ref 101–111)
GFR calc Af Amer: 56 mL/min — ABNORMAL LOW (ref >60)
GFR, EST NON AFRICAN AMERICAN: 48 mL/min — AB (ref >60)
Glucose, Bld: 118 mg/dL — ABNORMAL HIGH (ref 65–99)
Potassium: 3.7 mmol/L (ref 3.5–5.1)
SODIUM: 140 mmol/L (ref 135–145)

## 2017-05-29 MED ORDER — LEVOFLOXACIN 500 MG PO TABS: 500.0000 mg | ORAL_TABLET | ORAL | 0 refills | Status: AC

## 2017-05-29 NOTE — Progress Notes (Signed)
Audrey Reed to be D/C'd Skilled nursing facility per MD order.  Discussed prescriptions and follow up appointments with the patient. Prescriptions given to patient, medication list explained in detail. Pt verbalized understanding.  Allergies as of 05/29/2017      Reactions   Metformin And Related    diarrhea   Nitrofurantoin    REACTION: unsure of reaction--happened long ago   Tramadol Nausea Only   Codeine Nausea And Vomiting, Swelling, Rash      Medication List    STOP taking these medications   carvedilol 12.5 MG tablet Commonly known as:  COREG   furosemide 40 MG tablet Commonly known as:  LASIX     TAKE these medications   acetaminophen 325 MG tablet Commonly known as:  TYLENOL Take 2 tablets (650 mg total) by mouth every 6 (six) hours as needed (or Fever >/= 101).   CERTAVITE SENIOR/ANTIOXIDANT Tabs Take by mouth.   feeding supplement (ENSURE) Pudg Take 1 Container by mouth 3 (three) times daily between meals.   ferrous sulfate 325 (65 FE) MG tablet take 1 tablet by mouth twice a day with meals   fluticasone 50 MCG/ACT nasal spray Commonly known as:  FLONASE Place 2 sprays into both nostrils daily.   hydrALAZINE 25 MG tablet Commonly known as:  APRESOLINE Take 1 tablet (25 mg total) by mouth 2 (two) times daily.   levofloxacin 500 MG tablet Commonly known as:  LEVAQUIN Take 1 tablet (500 mg total) by mouth every other day.   saccharomyces boulardii 250 MG capsule Commonly known as:  FLORASTOR Take 1 capsule (250 mg total) by mouth daily.   simvastatin 20 MG tablet Commonly known as:  ZOCOR Take 1 tablet (20 mg total) by mouth at bedtime.   Vitamin D3 1000 units Caps Take 2 capsules (2,000 Units total) by mouth daily.       Vitals:   05/29/17 0600 05/29/17 0900  BP: (!) 155/67 (!) 166/73  Pulse: 77 79  Resp: 18 18  Temp: 98.2 F (36.8 C) 98 F (36.7 C)  SpO2: 98% 99%    Patient leaving with stage 2 on sacrum with foam dressings and foam  dressing on left lower leg.  IV catheter discontinued intact upon removal. Site without signs and symptoms of complications. Dressing and pressure applied. Pt denies pain at this time. No complaints noted.  An After Visit Summary was printed and given to the patient. Patient escorted via bed by transfort, and D/C to SNF via PTR.  Chuck Hint RN [facility] [employer]

## 2017-05-29 NOTE — NC FL2 (Signed)
[place] MEDICAID FL2 LEVEL OF CARE SCREENING TOOL     IDENTIFICATION  Patient Name: Audrey Reed Birthdate: 09/03/1917 Sex: female Admission Date (Current Location): 05/24/2017  [facility] and [place] Number:  Herbalist and Address:  [facility], [place] [address] [place], [place] [address]      Provider Number: [id number]  Attending Physician Name and Address:  Debbe Odea, MD  Relative Name and Phone Number:  Ally[place], [place] [phone]; [phone]     Current Level of Care: Hospital Recommended Level of Care: [place] Prior Approval Number:    Date Approved/Denied:   PASRR Number: [id number]   (Eff. 08/08/12)  Discharge Plan: SNF    Current Diagnoses: Patient Active Problem List   Diagnosis Date Noted  . Aortic stenosis- severe 05/28/2017  . CKD (chronic kidney disease) stage 3, GFR 30-59 ml/min (HCC) 05/26/2017  . Acute lower UTI 05/26/2017  . Pressure injury of skin 05/25/2017  . Symptomatic bradycardia 05/24/2017  . Ventilator dependent ([place])   . Bradycardia, sinus, persistent, severe   . Multiple renal cysts 10/28/2015  . Leg ulcer ([place]) 10/13/2015  . Type II diabetes mellitus with complication ([place]) 97/67/3419  . Routine general medical examination at a health care facility 12/23/2013  . Renal insufficiency 12/26/2012  . Breast cancer ([place]) 10/22/2007  . Coronary atherosclerosis 10/22/2007  . Essential hypertension 05/28/2007  . DEGENERATIVE JOINT DISEASE, GENERALIZED 05/28/2007  . OSTEOPOROSIS 05/28/2007    Orientation RESPIRATION BLADDER Height & Weight     Self, Time, Situation, Place  Normal Continent, External catheter (catheter placed 05/25/17) Weight:  (unable to weigh, bed scale nonfunctional) Height:  5\' 2"  (157.5 cm)  BEHAVIORAL SYMPTOMS/MOOD NEUROLOGICAL BOWEL NUTRITION STATUS      Continent Diet (Low sodium - Heart healthy)  AMBULATORY STATUS COMMUNICATION OF NEEDS Skin   Limited  Assist (Min guard per PT) Verbally Other (Comment) (Pressure injur to sacrum; pressure injury to buttocks; )                       Personal Care Assistance Level of Assistance  Bathing, Feeding, Dressing Bathing Assistance: Limited assistance Feeding assistance: Independent Dressing Assistance: Limited assistance     Functional Limitations Info  Sight, Hearing, Speech Sight Info: Impaired Hearing Info: Adequate Speech Info: Adequate    SPECIAL CARE FACTORS FREQUENCY  PT (By licensed PT)     PT Frequency: Evaluated 10/20              Contractures Contractures Info: Not present    Additional Factors Info  Code Status, Allergies Code Status Info: DNR Allergies Info: Metformin, Nitofurantoin, Tramadol, Codeine           Current Medications (05/29/2017):  This is the current hospital active medication list Current Facility-Administered Medications  Medication Dose Route Frequency Provider Last Rate Last Dose  . 0.9 %  sodium chloride infusion  250 mL Intravenous PRN Omar Person, NP      . feeding supplement (ENSURE ENLIVE) (ENSURE ENLIVE) liquid 237 mL  237 mL Oral BID BM Rizwan, Saima, MD   237 mL at 05/27/17 1442  . heparin injection 5,000 Units  5,000 Units Subcutaneous Q8H Omar Person, NP   5,000 Units at 05/29/17 0600  . hydrALAZINE (APRESOLINE) injection 10 mg  10 mg Intravenous Q6H PRN Rizwan, Saima, MD      . hydrALAZINE (APRESOLINE) injection 5 mg  5 mg Intravenous Q6H PRN Hayden Pedro  M, NP      . hydrALAZINE (APRESOLINE) tablet 25 mg  25 mg Oral BID Debbe Odea, MD   25 mg at 05/29/17 1028  . levofloxacin (LEVAQUIN) IVPB 500 mg  500 mg Intravenous Q48H Romona Curls, [place]   Stopped at 05/28/17 1637  . simvastatin (ZOCOR) tablet 20 mg  20 mg Oral QHS Debbe Odea, MD   20 mg at 05/28/17 2156     Discharge Medications: Please see discharge summary for a list of discharge medications.  Relevant Imaging Results:  Relevant Lab  Results:   Additional Information ss#[id number]  Sable Feil, LCSW

## 2017-05-29 NOTE — Clinical Social Work Note (Signed)
Clinical Social Work Assessment  Patient Details  Name: Audrey Reed MRN: [id number] Date of Birth: Apr 25, 1918  Date of referral:  05/27/17               Reason for consult:  Facility Placement                Permission sought to share information with:  Family Supports Permission granted to share information::  Yes, Verbal Permission Granted  Name::     Tanasha Menees and [place]::     Relationship::  Son and granddaughter  Sport and exercise psychologist Information:  Sonia Side - [phone] and Otila Kluver - [phone]  Housing/Transportation Living arrangements for the past 2 months:  [place] of Information:  Other (Comment Required), Adult Children (Chart review and granddaughter) Patient Interpreter Needed:  None Criminal Activity/Legal Involvement Pertinent to Current Situation/Hospitalization:  No - Comment as needed Significant Relationships:  Adult Children, Other Family Members Lives with:  Self Do you feel safe going back to the place where you live?  No (Son, granddaughter and patient in agreement with rehab prior to patient returning home) Need for family participation in patient care:  Yes (Comment)  Care giving concerns:  Family in agreement with rehab and made contact with [facility] regarding ST rehab.     Social Worker assessment / plan:  CSW talked with patient's son and granddaughter and both agree that ST rehab is best for patient as she lives alone. CSW informed that patient has been to [facility] before. CSW also talked with patient at the bedside. Ms. Boxley was sitting up in bed with her lunch before her (not much eaten). Patient was alert, oriented, pleasant and engaged easily with CSW and CSW intern who iniated conversation with Mrs. Kolar. Patient informed of her d/c today to [facility] and she did not express any disagreement with the discharge plan.    Employment status:  Retired Forensic scientist:  Medicare PT Recommendations:  [place] / Referral to community resources:  Other (Comment Required) (SNF list not needed as family provided facility preference)  Patient/Family's Response to care:  No concerns expressed by patient/family regarding care during hospitalization.  Patient/Family's Understanding of and Emotional Response to Diagnosis, Current Treatment, and Prognosis:  Family expressed understanding that rehab will assist  Not discussed.  Emotional Assessment Appearance:  Appears stated age Attitude/Demeanor/Rapport:  Other (Appropriate) Affect (typically observed):  Appropriate, Pleasant Orientation:  Oriented to Self, Oriented to Place, Oriented to Situation Alcohol / Substance use:  Tobacco Use, Alcohol Use, Illicit Drugs (Patient reported that she quit smoking, and does not drink or use illicit drugs) Psych involvement (Current and /or in the community):  No (Comment)  Discharge Needs  Concerns to be addressed:  Discharge Planning Concerns Readmission within the last 30 days:  No Current discharge risk:  None Barriers to Discharge:  No Barriers Identified   Sable Feil, LCSW 05/29/2017, 3:14 PM

## 2017-05-29 NOTE — Clinical Social Work Placement (Signed)
   CLINICAL SOCIAL WORK PLACEMENT  NOTE 05/29/17 - DISCHARGED TO [facility]UL[facility]  Date:  05/29/2017  Patient Details  Name: Audrey Reed MRN: [id number] Date of Birth: Aug 25, 1917  Clinical Social Work is seeking post-discharge placement for this patient at the [place] level of care (*CSW will initial, date and re-position this form in  chart as items are completed):  No (Family expressed facility preference)   Patient/family provided with [place] Work Department's list of facilities offering this level of care within the geographic area requested by the patient (or if unable, by the patient's family).  Yes   Patient/family informed of their freedom to choose among providers that offer the needed level of care, that participate in Medicare, Medicaid or managed care program needed by the patient, have an available bed and are willing to accept the patient.  No   Patient/family informed of [place]'s ownership interest in [facility] : [facility] and [facility], as well as of the fact that they are under no obligation to receive care at these facilities.  PASRR submitted to EDS on       PASRR number received on       Existing PASRR number confirmed on 05/28/17     FL2 transmitted to all facilities in geographic area requested by pt/family on 05/29/17     FL2 transmitted to all facilities within larger geographic area on       Patient informed that his/her managed care company has contracts with or will negotiate with certain facilities, including the following:        Yes   Patient/family informed of bed offers received.  Patient chooses bed at [place], [place]     Physician recommends and patient chooses bed at      Patient to be transferred to [place], [place] on 05/29/17.  Patient to be transferred to facility by ambulance     Patient family notified on 05/29/17 of transfer.  Name of family member  notified:  Son, Sonia Side Grow by phne     PHYSICIAN       Additional Comment:    _______________________________________________ Sable Feil, LCSW 05/29/2017, 5:48 PM

## 2017-05-29 NOTE — Consult Note (Signed)
[facility] CM Primary Care Navigator  05/29/2017  Audrey Reed [birth year] [id number]   Met with patient at the bedside to identify possible discharge needs. Patient reports that she was found confused on the living room floor by her son (who checks on her twice a day) which resulted to this admission.  Patient endorses Dr. Scarlette Calico with [facility] as theprimary care provider.   Patient sharedusing [facility] pharmacy on [address] to obtain medications without difficulty.   Patientstates that she manages her medications at home straight out of the containers with her own "organizing system" (has a schedule to go by).  According to patient, her son has been providing transportationto her doctors'appointments.  Patient reports living alone and her son lives two houses away from her. Son is her primary caregiver at home. Her granddaughter Otila Kluver) and grandson Audelia Acton) also checks on her and provides assistance with needs per patient.  Anticipated discharge plan is SNF (skilled nursing facility- still in process) per therapy recommendation.  Patient voiced understanding to call primary care provider's officewhen shereturns backhome,for a post discharge follow-up within a week or sooner if needs arise.Patient letter (with PCP's contact number) was provided as areminder.   Discussed with patientregarding [facility] CM services available for health managementat home. Patient states that her son, granddaughter and grandson are all helping her manage to at home.  Patient expressed understanding to seekreferral from primary care provider to [facility] care management ifdeemed necessary and appropriatefor services in the future- once she returns back home.  [facility] care management information was provided for future needs that she may have.   For questions, please contact:  Dannielle Huh, BSN, RN- [facility] Primary Care Navigator  Telephone: [phone] [place]

## 2017-05-29 NOTE — Care Management Note (Signed)
Case Management Note  Patient Details  Name: Audrey Reed MRN: [id number] Date of Birth: 11-19-1917  Subjective/Objective:      CM following for progression and d/c planning.               Action/Plan: 05/29/2017 Pt for d/c to SNF for ongoing care, No HH or DME identified at this time.   Expected Discharge Date:  05/29/17               Expected Discharge Plan:  [place]  In-House Referral:  Clinical Social Work  Discharge planning Services  NA  Post Acute Care Choice:  NA Choice offered to:  NA  DME Arranged:  N/A DME Agency:  NA  HH Arranged:  NA HH Agency:  NA  Status of Service:  Completed, signed off  If discussed at [employer] of Stay Meetings, dates discussed:    Additional Comments:  Adron Bene, RN 05/29/2017, 2:58 PM

## 2017-05-30 ENCOUNTER — Telehealth: Payer: Self-pay

## 2017-05-30 LAB — CULTURE, BLOOD (ROUTINE X 2)
CULTURE: NO GROWTH
Culture: NO GROWTH
SPECIAL REQUESTS: ADEQUATE
SPECIAL REQUESTS: ADEQUATE

## 2017-05-30 NOTE — Telephone Encounter (Signed)
Pt on TCM list. DC'ed on 05/29/2017 After admission for cough and ventilator dependent. Pt has been dc'ed to to SNF.

## 2017-06-08 DIAGNOSIS — S31000A Unspecified open wound of lower back and pelvis without penetration into retroperitoneum, initial encounter: Secondary | ICD-10-CM | POA: Diagnosis not present

## 2017-06-26 ENCOUNTER — Telehealth: Payer: Self-pay | Admitting: Internal Medicine

## 2017-06-26 DIAGNOSIS — I129 Hypertensive chronic kidney disease with stage 1 through stage 4 chronic kidney disease, or unspecified chronic kidney disease: Secondary | ICD-10-CM | POA: Diagnosis not present

## 2017-06-26 DIAGNOSIS — I2510 Atherosclerotic heart disease of native coronary artery without angina pectoris: Secondary | ICD-10-CM | POA: Diagnosis not present

## 2017-06-26 DIAGNOSIS — N183 Chronic kidney disease, stage 3 (moderate): Secondary | ICD-10-CM | POA: Diagnosis not present

## 2017-06-26 DIAGNOSIS — E1122 Type 2 diabetes mellitus with diabetic chronic kidney disease: Secondary | ICD-10-CM | POA: Diagnosis not present

## 2017-06-26 DIAGNOSIS — I350 Nonrheumatic aortic (valve) stenosis: Secondary | ICD-10-CM | POA: Diagnosis not present

## 2017-06-26 DIAGNOSIS — I272 Pulmonary hypertension, unspecified: Secondary | ICD-10-CM | POA: Diagnosis not present

## 2017-06-26 NOTE — Telephone Encounter (Signed)
Reporting a high BP of 172/96 with NO SX  Verbal orders for Nursing  3x1 1x2 EOW x 2  [phone] Marlowe Kays OK to LVM

## 2017-06-27 DIAGNOSIS — E1122 Type 2 diabetes mellitus with diabetic chronic kidney disease: Secondary | ICD-10-CM | POA: Diagnosis not present

## 2017-06-27 DIAGNOSIS — I129 Hypertensive chronic kidney disease with stage 1 through stage 4 chronic kidney disease, or unspecified chronic kidney disease: Secondary | ICD-10-CM | POA: Diagnosis not present

## 2017-06-27 DIAGNOSIS — I272 Pulmonary hypertension, unspecified: Secondary | ICD-10-CM | POA: Diagnosis not present

## 2017-06-27 DIAGNOSIS — N183 Chronic kidney disease, stage 3 (moderate): Secondary | ICD-10-CM | POA: Diagnosis not present

## 2017-06-27 DIAGNOSIS — I350 Nonrheumatic aortic (valve) stenosis: Secondary | ICD-10-CM | POA: Diagnosis not present

## 2017-06-27 DIAGNOSIS — I2510 Atherosclerotic heart disease of native coronary artery without angina pectoris: Secondary | ICD-10-CM | POA: Diagnosis not present

## 2017-06-27 NOTE — Telephone Encounter (Signed)
Spoke to Bug Tussle and gave verbal okay.

## 2017-06-27 NOTE — Telephone Encounter (Signed)
Has called back.  Wants to know what you need clarification on?  Can be reached back at [phone].

## 2017-06-27 NOTE — Telephone Encounter (Signed)
lvm for Audrey Reed to call back. I need clarification on Verbal Order she is requesting [place] to give.

## 2017-06-29 DIAGNOSIS — I272 Pulmonary hypertension, unspecified: Secondary | ICD-10-CM | POA: Diagnosis not present

## 2017-06-29 DIAGNOSIS — I2510 Atherosclerotic heart disease of native coronary artery without angina pectoris: Secondary | ICD-10-CM | POA: Diagnosis not present

## 2017-06-29 DIAGNOSIS — E1122 Type 2 diabetes mellitus with diabetic chronic kidney disease: Secondary | ICD-10-CM | POA: Diagnosis not present

## 2017-06-29 DIAGNOSIS — N183 Chronic kidney disease, stage 3 (moderate): Secondary | ICD-10-CM | POA: Diagnosis not present

## 2017-06-29 DIAGNOSIS — I129 Hypertensive chronic kidney disease with stage 1 through stage 4 chronic kidney disease, or unspecified chronic kidney disease: Secondary | ICD-10-CM | POA: Diagnosis not present

## 2017-06-29 DIAGNOSIS — I350 Nonrheumatic aortic (valve) stenosis: Secondary | ICD-10-CM | POA: Diagnosis not present

## 2017-07-03 DIAGNOSIS — I2510 Atherosclerotic heart disease of native coronary artery without angina pectoris: Secondary | ICD-10-CM | POA: Diagnosis not present

## 2017-07-03 DIAGNOSIS — N183 Chronic kidney disease, stage 3 (moderate): Secondary | ICD-10-CM | POA: Diagnosis not present

## 2017-07-03 DIAGNOSIS — I129 Hypertensive chronic kidney disease with stage 1 through stage 4 chronic kidney disease, or unspecified chronic kidney disease: Secondary | ICD-10-CM | POA: Diagnosis not present

## 2017-07-03 DIAGNOSIS — E1122 Type 2 diabetes mellitus with diabetic chronic kidney disease: Secondary | ICD-10-CM | POA: Diagnosis not present

## 2017-07-03 DIAGNOSIS — I272 Pulmonary hypertension, unspecified: Secondary | ICD-10-CM | POA: Diagnosis not present

## 2017-07-03 DIAGNOSIS — I350 Nonrheumatic aortic (valve) stenosis: Secondary | ICD-10-CM | POA: Diagnosis not present

## 2017-07-04 ENCOUNTER — Telehealth: Payer: Self-pay | Admitting: Internal Medicine

## 2017-07-04 DIAGNOSIS — I272 Pulmonary hypertension, unspecified: Secondary | ICD-10-CM | POA: Diagnosis not present

## 2017-07-04 DIAGNOSIS — I129 Hypertensive chronic kidney disease with stage 1 through stage 4 chronic kidney disease, or unspecified chronic kidney disease: Secondary | ICD-10-CM | POA: Diagnosis not present

## 2017-07-04 DIAGNOSIS — N183 Chronic kidney disease, stage 3 (moderate): Secondary | ICD-10-CM | POA: Diagnosis not present

## 2017-07-04 DIAGNOSIS — E1122 Type 2 diabetes mellitus with diabetic chronic kidney disease: Secondary | ICD-10-CM | POA: Diagnosis not present

## 2017-07-04 DIAGNOSIS — I350 Nonrheumatic aortic (valve) stenosis: Secondary | ICD-10-CM | POA: Diagnosis not present

## 2017-07-04 DIAGNOSIS — I2510 Atherosclerotic heart disease of native coronary artery without angina pectoris: Secondary | ICD-10-CM | POA: Diagnosis not present

## 2017-07-04 NOTE — Telephone Encounter (Signed)
Copied from [place] [phone]. Topic: General - Other >> Jul 04, 2017  9:00 AM Synthia Innocent wrote: Reason for CRM: Requesting OT orders for 1 times a week for 2 weeks, 2 times a week for 2 weeks

## 2017-07-04 NOTE — Telephone Encounter (Signed)
Left detailed message for Remo Lipps with verbal okay for OT as requested.

## 2017-07-05 ENCOUNTER — Ambulatory Visit (INDEPENDENT_AMBULATORY_CARE_PROVIDER_SITE_OTHER): Payer: Medicare Other | Admitting: Internal Medicine

## 2017-07-05 ENCOUNTER — Telehealth: Payer: Self-pay | Admitting: Internal Medicine

## 2017-07-05 ENCOUNTER — Ambulatory Visit: Payer: PRIVATE HEALTH INSURANCE | Admitting: Internal Medicine

## 2017-07-05 ENCOUNTER — Encounter: Payer: Self-pay | Admitting: Internal Medicine

## 2017-07-05 VITALS — BP 174/100 | HR 42 | Temp 97.6°F | Resp 16 | Ht 62.0 in | Wt 128.0 lb

## 2017-07-05 DIAGNOSIS — I2510 Atherosclerotic heart disease of native coronary artery without angina pectoris: Secondary | ICD-10-CM

## 2017-07-05 DIAGNOSIS — I10 Essential (primary) hypertension: Secondary | ICD-10-CM | POA: Diagnosis not present

## 2017-07-05 DIAGNOSIS — R001 Bradycardia, unspecified: Secondary | ICD-10-CM

## 2017-07-05 DIAGNOSIS — R5382 Chronic fatigue, unspecified: Secondary | ICD-10-CM | POA: Diagnosis not present

## 2017-07-05 MED ORDER — TORSEMIDE 10 MG PO TABS: 10.0000 mg | ORAL_TABLET | Freq: Every day | ORAL | 1 refills | Status: AC

## 2017-07-05 NOTE — Progress Notes (Signed)
Subjective:  Patient ID: Audrey Reed, female    DOB: 07/27/1918  Age: 83 y.o. MRN: [id number]  CC: Hypertension   HPI Audrey Reed presents for a BP check - she complains that her BP is too high and she has mild LE edema.  He and her son tell me that she is compliant with the hydralazine.  Outpatient Medications Prior to Visit  Medication Sig Dispense Refill  . acetaminophen (TYLENOL) 325 MG tablet Take 2 tablets (650 mg total) by mouth every 6 (six) hours as needed (or Fever >/= 101).    . Cholecalciferol (VITAMIN D3) 1000 units CAPS Take 2 capsules (2,000 Units total) by mouth daily. 90 capsule 3  . feeding supplement (ENSURE) PUDG Take 1 Container by mouth 3 (three) times daily between meals.    . ferrous sulfate 325 (65 FE) MG tablet take 1 tablet by mouth twice a day with meals 180 tablet 3  . hydrALAZINE (APRESOLINE) 25 MG tablet Take 1 tablet (25 mg total) by mouth 2 (two) times daily.    . Multiple Vitamins-Minerals (CERTAVITE SENIOR/ANTIOXIDANT) TABS Take by mouth.    . saccharomyces boulardii (FLORASTOR) 250 MG capsule Take 1 capsule (250 mg total) by mouth daily. 30 capsule 11  . simvastatin (ZOCOR) 20 MG tablet Take 1 tablet (20 mg total) by mouth at bedtime. 90 tablet 1  . fluticasone (FLONASE) 50 MCG/ACT nasal spray Place 2 sprays into both nostrils daily. 16 g 6   No facility-administered medications prior to visit.     ROS Review of Systems  Constitutional: Positive for fatigue. Negative for appetite change and diaphoresis.  HENT: Negative.   Eyes: Negative.  Negative for visual disturbance.  Respiratory: Negative for cough, chest tightness and shortness of breath.   Cardiovascular: Positive for leg swelling. Negative for chest pain and palpitations.  Gastrointestinal: Negative for abdominal pain, constipation, diarrhea, nausea and vomiting.  Endocrine: Negative.   Genitourinary: Negative.  Negative for difficulty urinating.  Musculoskeletal: Negative.   Skin:  Negative.   Allergic/Immunologic: Negative.   Neurological: Negative.  Negative for dizziness, weakness and light-headedness.  Hematological: Negative for adenopathy. Does not bruise/bleed easily.  Psychiatric/Behavioral: Negative.     Objective:  BP (!) 174/100 (BP Location: Left Arm, Patient Position: Sitting, Cuff Size: Normal)   Pulse (!) 42   Temp 97.6 F (36.4 C) (Oral)   Resp 16   Ht 5\' 2"  (1.575 m)   Wt 128 lb (58.1 kg)   SpO2 96%   BMI 23.41 kg/m   BP Readings from Last 3 Encounters:  07/05/17 (!) 174/100  05/29/17 (!) 166/73  12/23/15 126/84    Wt Readings from Last 3 Encounters:  07/05/17 128 lb (58.1 kg)  05/28/17 132 lb (59.9 kg)  12/23/15 132 lb (59.9 kg)    Physical Exam  Constitutional: She is oriented to person, place, and time. No distress.  HENT:  Mouth/Throat: Oropharynx is clear and moist. No oropharyngeal exudate.  Eyes: Conjunctivae are normal.  Neck: Normal range of motion. Neck supple. No JVD present. No thyromegaly present.  Cardiovascular: Regular rhythm, S1 normal and S2 normal. Bradycardia present. Exam reveals no gallop.  Murmur heard.  Systolic murmur is present with a grade of 1/6.  No diastolic murmur is present. 1/6 Harsh SEM  Pulmonary/Chest: Effort normal and breath sounds normal. No respiratory distress. She has no wheezes. She has no rales. She exhibits no tenderness.  Abdominal: Soft. Bowel sounds are normal. There is no tenderness. There is  no rebound and no guarding.  Musculoskeletal: Normal range of motion. She exhibits edema (1+ pitting edema LE). She exhibits no tenderness or deformity.  Neurological: She is alert and oriented to person, place, and time.  Skin: Skin is warm and dry. No rash noted. She is not diaphoretic. No erythema. No pallor.    Lab Results  Component Value Date   WBC 9.1 05/29/2017   HGB 12.4 05/29/2017   HCT 37.8 05/29/2017   PLT 163 05/29/2017   GLUCOSE 118 (H) 05/29/2017   CHOL 168 10/13/2015    TRIG 64 05/24/2017   HDL 72.90 10/13/2015   LDLCALC 78 10/13/2015   ALT 68 (H) 05/25/2017   AST 72 (H) 05/25/2017   NA 140 05/29/2017   K 3.7 05/29/2017   CL 109 05/29/2017   CREATININE 0.96 05/29/2017   BUN 15 05/29/2017   CO2 24 05/29/2017   TSH 2.31 10/13/2015   INR 1.31 08/04/2012   HGBA1C 6.0 10/13/2015    Ct Head Wo Contrast  Result Date: 05/24/2017 CLINICAL DATA:  Altered level of consciousness. EXAM: CT HEAD WITHOUT CONTRAST TECHNIQUE: Contiguous axial images were obtained from the base of the skull through the vertex without intravenous contrast. COMPARISON:  11/03/2012 FINDINGS: Brain: No evidence of acute infarction, hemorrhage, hydrocephalus, extra-axial collection, or mass lesion/mass effect. Stable mild to moderate cerebral atrophy and chronic small vessel disease. Vascular:  No hyperdense vessel or other acute findings. Skull: No evidence of fracture or other significant bone abnormality. Sinuses/Orbits:  No acute findings. Other: None. IMPRESSION: No acute intracranial abnormality. Cerebral atrophy and chronic small vessel disease. Electronically Signed   By: Earle Gell M.D.   On: 05/24/2017 20:25   Dg Chest Port 1 View  Result Date: 05/25/2017 CLINICAL DATA:  Intubation. EXAM: PORTABLE CHEST 1 VIEW COMPARISON:  05/24/2017. FINDINGS: Endotracheal tube and NG tube in stable position. Heart size normal. Low lung volumes . No focal infiltrate. No prominent pleural effusion. No pneumothorax. IMPRESSION: 1. Endotracheal tube and NG tube in stable position. 2. Low lung volumes. Electronically Signed   By: Marcello Moores  Register   On: 05/25/2017 06:42   Dg Chest Portable 1 View  Result Date: 05/24/2017 CLINICAL DATA:  Post intubation EXAM: PORTABLE CHEST 1 VIEW COMPARISON:  03/21/2014 FINDINGS: Endotracheal tube tip is about 1.7 cm superior to the carina. Esophageal tube tip is below the diaphragm. Diffuse coarse interstitial opacity likely chronic. No consolidation or effusion.  Stable cardiomediastinal silhouette with atherosclerosis. No pneumothorax. IMPRESSION: Endotracheal tube tip about 1.7 cm superior to carina Probable coarse chronic interstitial opacity. No edema or consolidation. Electronically Signed   By: Donavan Foil M.D.   On: 05/24/2017 20:08    Assessment & Plan:   Audrey Reed was seen today for hypertension.  Diagnoses and all orders for this visit:  Essential hypertension- Her BP is not well controlled and she has lower extremity edema.  Will start a low-dose loop diuretic.  Will continue hydralazine at the current dose. -     torsemide (DEMADEX) 10 MG tablet; Take 1 tablet (10 mg total) by mouth daily.  Bradycardia, sinus, persistent, severe- Her heart rate is 42 but she is asymptomatic with respect to this.  She and her son will let me know if she develops any symptoms related to this. -     DME Other see comment  Chronic fatigue -     DME Other see comment   I have discontinued Audrey Reed fluticasone. I am also having her start  on torsemide. Additionally, I am having her maintain her acetaminophen, feeding supplement (ENSURE), saccharomyces boulardii, CERTAVITE SENIOR/ANTIOXIDANT, Vitamin D3, ferrous sulfate, simvastatin, and hydrALAZINE.  Meds ordered this encounter  Medications  . torsemide (DEMADEX) 10 MG tablet    Sig: Take 1 tablet (10 mg total) by mouth daily.    Dispense:  90 tablet    Refill:  1     Follow-up: Return in about 3 months (around 10/04/2017).  Scarlette Calico, MD

## 2017-07-05 NOTE — Telephone Encounter (Signed)
Pt is being worked in today on PCP schedule.

## 2017-07-05 NOTE — Telephone Encounter (Signed)
Audrey Reed is on [place] for today for a 15 minute visit.  Audrey Reed states she needs a 30 minute hospital follow up today.  Can she be worked in today?

## 2017-07-05 NOTE — Patient Instructions (Signed)

## 2017-07-06 ENCOUNTER — Telehealth: Payer: Self-pay | Admitting: Internal Medicine

## 2017-07-06 NOTE — Telephone Encounter (Signed)
Copied from [place]. Topic: Quick Communication - See Telephone Encounter >> Jul 06, 2017  2:34 PM Antonieta Iba C wrote: CRM for notification. See Telephone encounter for:  07/06/17.    Olivia Mackie -[facility] - [phone]   Called in to make pcp aware that pt's son cancelled her visit today.

## 2017-07-06 NOTE — Telephone Encounter (Signed)
Copied from [place]. Topic: Quick Communication - See Telephone Encounter >> Jul 06, 2017  2:34 PM Antonieta Iba C wrote: CRM for notification. See Telephone encounter for:  07/06/17.

## 2017-07-09 DIAGNOSIS — I129 Hypertensive chronic kidney disease with stage 1 through stage 4 chronic kidney disease, or unspecified chronic kidney disease: Secondary | ICD-10-CM | POA: Diagnosis not present

## 2017-07-09 DIAGNOSIS — I350 Nonrheumatic aortic (valve) stenosis: Secondary | ICD-10-CM | POA: Diagnosis not present

## 2017-07-09 DIAGNOSIS — E1122 Type 2 diabetes mellitus with diabetic chronic kidney disease: Secondary | ICD-10-CM | POA: Diagnosis not present

## 2017-07-09 DIAGNOSIS — N183 Chronic kidney disease, stage 3 (moderate): Secondary | ICD-10-CM | POA: Diagnosis not present

## 2017-07-09 DIAGNOSIS — I2510 Atherosclerotic heart disease of native coronary artery without angina pectoris: Secondary | ICD-10-CM | POA: Diagnosis not present

## 2017-07-09 DIAGNOSIS — I272 Pulmonary hypertension, unspecified: Secondary | ICD-10-CM | POA: Diagnosis not present

## 2017-07-10 DIAGNOSIS — I2510 Atherosclerotic heart disease of native coronary artery without angina pectoris: Secondary | ICD-10-CM | POA: Diagnosis not present

## 2017-07-10 DIAGNOSIS — I350 Nonrheumatic aortic (valve) stenosis: Secondary | ICD-10-CM | POA: Diagnosis not present

## 2017-07-10 DIAGNOSIS — E1122 Type 2 diabetes mellitus with diabetic chronic kidney disease: Secondary | ICD-10-CM | POA: Diagnosis not present

## 2017-07-10 DIAGNOSIS — N183 Chronic kidney disease, stage 3 (moderate): Secondary | ICD-10-CM | POA: Diagnosis not present

## 2017-07-10 DIAGNOSIS — I129 Hypertensive chronic kidney disease with stage 1 through stage 4 chronic kidney disease, or unspecified chronic kidney disease: Secondary | ICD-10-CM | POA: Diagnosis not present

## 2017-07-10 DIAGNOSIS — I272 Pulmonary hypertension, unspecified: Secondary | ICD-10-CM | POA: Diagnosis not present

## 2017-07-11 ENCOUNTER — Telehealth: Payer: Self-pay | Admitting: Internal Medicine

## 2017-07-11 DIAGNOSIS — E1122 Type 2 diabetes mellitus with diabetic chronic kidney disease: Secondary | ICD-10-CM | POA: Diagnosis not present

## 2017-07-11 DIAGNOSIS — N183 Chronic kidney disease, stage 3 (moderate): Secondary | ICD-10-CM | POA: Diagnosis not present

## 2017-07-11 DIAGNOSIS — I129 Hypertensive chronic kidney disease with stage 1 through stage 4 chronic kidney disease, or unspecified chronic kidney disease: Secondary | ICD-10-CM | POA: Diagnosis not present

## 2017-07-11 DIAGNOSIS — I350 Nonrheumatic aortic (valve) stenosis: Secondary | ICD-10-CM | POA: Diagnosis not present

## 2017-07-11 DIAGNOSIS — I2510 Atherosclerotic heart disease of native coronary artery without angina pectoris: Secondary | ICD-10-CM | POA: Diagnosis not present

## 2017-07-11 DIAGNOSIS — I272 Pulmonary hypertension, unspecified: Secondary | ICD-10-CM | POA: Diagnosis not present

## 2017-07-11 NOTE — Telephone Encounter (Signed)
Copied from [place] [id number]. Topic: General - Other >> Jul 11, 2017 11:48 AM Patrice Paradise wrote: Reason for CRM: Linus Orn from [facility] called to let Dr. Ronnald Ramp know that she was at the patient house today and the patient heart rate is reading 40, 39 & 41. Linus Orn is requesting a call back for Dr. Ronnald Ramp or his assistant.

## 2017-07-12 ENCOUNTER — Encounter (HOSPITAL_COMMUNITY): Payer: Self-pay | Admitting: Emergency Medicine

## 2017-07-12 ENCOUNTER — Other Ambulatory Visit: Payer: Self-pay

## 2017-07-12 ENCOUNTER — Emergency Department (HOSPITAL_COMMUNITY): Payer: Medicare Other

## 2017-07-12 ENCOUNTER — Emergency Department (HOSPITAL_COMMUNITY)
Admission: EM | Admit: 2017-07-12 | Discharge: 2017-07-12 | Disposition: A | Discharge status: Home / Self Care | Payer: Medicare Other | Attending: Emergency Medicine | Admitting: Emergency Medicine

## 2017-07-12 ENCOUNTER — Ambulatory Visit: Payer: Self-pay | Admitting: Hematology

## 2017-07-12 DIAGNOSIS — N183 Chronic kidney disease, stage 3 (moderate): Secondary | ICD-10-CM | POA: Insufficient documentation

## 2017-07-12 DIAGNOSIS — R079 Chest pain, unspecified: Secondary | ICD-10-CM | POA: Diagnosis not present

## 2017-07-12 DIAGNOSIS — I493 Ventricular premature depolarization: Secondary | ICD-10-CM | POA: Diagnosis not present

## 2017-07-12 DIAGNOSIS — F0390 Unspecified dementia without behavioral disturbance: Secondary | ICD-10-CM | POA: Insufficient documentation

## 2017-07-12 DIAGNOSIS — E119 Type 2 diabetes mellitus without complications: Secondary | ICD-10-CM | POA: Insufficient documentation

## 2017-07-12 DIAGNOSIS — I350 Nonrheumatic aortic (valve) stenosis: Secondary | ICD-10-CM | POA: Diagnosis not present

## 2017-07-12 DIAGNOSIS — Z87891 Personal history of nicotine dependence: Secondary | ICD-10-CM | POA: Insufficient documentation

## 2017-07-12 DIAGNOSIS — Z853 Personal history of malignant neoplasm of breast: Secondary | ICD-10-CM | POA: Insufficient documentation

## 2017-07-12 DIAGNOSIS — I2510 Atherosclerotic heart disease of native coronary artery without angina pectoris: Secondary | ICD-10-CM | POA: Insufficient documentation

## 2017-07-12 DIAGNOSIS — R001 Bradycardia, unspecified: Secondary | ICD-10-CM | POA: Diagnosis present

## 2017-07-12 DIAGNOSIS — I272 Pulmonary hypertension, unspecified: Secondary | ICD-10-CM | POA: Diagnosis not present

## 2017-07-12 DIAGNOSIS — R0602 Shortness of breath: Secondary | ICD-10-CM | POA: Diagnosis not present

## 2017-07-12 DIAGNOSIS — I129 Hypertensive chronic kidney disease with stage 1 through stage 4 chronic kidney disease, or unspecified chronic kidney disease: Secondary | ICD-10-CM | POA: Insufficient documentation

## 2017-07-12 DIAGNOSIS — Z79899 Other long term (current) drug therapy: Secondary | ICD-10-CM | POA: Diagnosis not present

## 2017-07-12 DIAGNOSIS — E1122 Type 2 diabetes mellitus with diabetic chronic kidney disease: Secondary | ICD-10-CM | POA: Diagnosis not present

## 2017-07-12 LAB — I-STAT TROPONIN, ED: Troponin i, poc: 0.03 ng/mL (ref 0.00–0.08)

## 2017-07-12 LAB — COMPREHENSIVE METABOLIC PANEL
ALT: 8 U/L — ABNORMAL LOW (ref 14–54)
ANION GAP: 11 (ref 5–15)
AST: 43 U/L — ABNORMAL HIGH (ref 15–41)
Albumin: 4.1 g/dL (ref 3.5–5.0)
Alkaline Phosphatase: 41 U/L (ref 38–126)
BUN: 28 mg/dL — ABNORMAL HIGH (ref 6–20)
CHLORIDE: 101 mmol/L (ref 101–111)
CO2: 28 mmol/L (ref 22–32)
Calcium: 9.8 mg/dL (ref 8.9–10.3)
Creatinine, Ser: 1.35 mg/dL — ABNORMAL HIGH (ref 0.44–1.00)
GFR, EST AFRICAN AMERICAN: 37 mL/min — AB (ref >60)
GFR, EST NON AFRICAN AMERICAN: 32 mL/min — AB (ref >60)
Glucose, Bld: 125 mg/dL — ABNORMAL HIGH (ref 65–99)
POTASSIUM: 4.5 mmol/L (ref 3.5–5.1)
Sodium: 140 mmol/L (ref 135–145)
Total Bilirubin: 0.9 mg/dL (ref 0.3–1.2)
Total Protein: 6.9 g/dL (ref 6.5–8.1)

## 2017-07-12 LAB — CBC
HCT: 45.5 % (ref 36.0–46.0)
Hemoglobin: 15 g/dL (ref 12.0–15.0)
MCH: 29.4 pg (ref 26.0–34.0)
MCHC: 33 g/dL (ref 30.0–36.0)
MCV: 89 fL (ref 78.0–100.0)
PLATELETS: 197 10*3/uL (ref 150–400)
RBC: 5.11 MIL/uL (ref 3.87–5.11)
RDW: 15.5 % (ref 11.5–15.5)
WBC: 9.3 10*3/uL (ref 4.0–10.5)

## 2017-07-12 NOTE — ED Notes (Signed)
Spoke to Dr. Billy Fischer about patient.  Ordered placed, and patient okay'd to return to lobby at this time

## 2017-07-12 NOTE — ED Notes (Signed)
ED Provider at bedside. 

## 2017-07-12 NOTE — ED Provider Notes (Signed)
[facility]ERGENCY DEPARTMENT Provider Note   CSN: [id number] Arrival date & time: 07/12/17  1753     History   Chief Complaint Chief Complaint  Patient presents with  . low heart rate    HPI Audrey Reed is a 83 y.o. female.  HPI   83 year old female with history of coronary artery disease, hypertension, hyperlipidemia, diabetes, dementia presents with concern for low heart rate measured by home health.  Family reports that she had a syncopal episode about 1 month ago.  They reports she has not had any symptoms over the last several days, however yesterday and today when home health came out to the house, and checked her vital signs, they found her heart rate to be in the 30s and 40s, and given this abnormality, center to the emergency department.  She denies any symptoms, including lightheadedness, chest pain, shortness of breath.  Denies recent syncopal episodes, palpitations, numbness, weakness, infectious symptoms.  Family reports that when they were checking her heart rate they found it to be irregular.  Reports that she has a history of atrial fibrillation in the past and wondering if this has returned.  Past Medical History:  Diagnosis Date  . Adenocarcinoma of breast ([place]) 2005   left  . Anxiety disorder   . Arthritis    "hands" (05/29/2017)  . CAD (coronary artery disease)   . Chronic lower back pain   . Clostridium difficile infection 01/21/2015  . Colon polyp 06/26/03   9 mm sessile polyp 110 cm from anus; not retrieved  . Dementia   . Dermatophytosis of nail 03/31/2013  . Diverticulosis of colon   . DJD (degenerative joint disease)   . HTN (hypertension)   . Hypercholesterolemia   . Osteoporosis   . Pneumonia    "I've got it now and once before I think" (05/29/2017)  . Type II diabetes mellitus ([place])   . Venous insufficiency     Patient Active Problem List   Diagnosis Date Noted  . Severe Pulmonary HTN ([place]) 05/29/2017  . Aortic  stenosis- severe 05/28/2017  . CKD (chronic kidney disease) stage 3, GFR 30-59 ml/min ([facility]) 05/26/2017  . Bradycardia, sinus, persistent, severe   . Multiple renal cysts 10/28/2015  . Leg ulcer ([place]) 10/13/2015  . Type II diabetes mellitus with complication ([place]) 67/61/9509  . Routine general medical examination at a health care facility 12/23/2013  . Renal insufficiency 12/26/2012  . Breast cancer ([place]) 10/22/2007  . Coronary atherosclerosis 10/22/2007  . Essential hypertension 05/28/2007  . DEGENERATIVE JOINT DISEASE, GENERALIZED 05/28/2007  . OSTEOPOROSIS 05/28/2007    Past Surgical History:  Procedure Laterality Date  . APPENDECTOMY    . BREAST BIOPSY Left 2005   core needle biopsy /notes 12/20/2010  . CATARACT EXTRACTION W/ INTRAOCULAR LENS  IMPLANT, BILATERAL Bilateral   . CYST EXCISION     "off my back"  . MASTECTOMY, PARTIAL  06/2004   Archie Endo 12/20/2010  . TONSILLECTOMY      OB History    No data available       Home Medications    Prior to Admission medications   Medication Sig Start Date End Date Taking? Authorizing Provider  acetaminophen (TYLENOL) 325 MG tablet Take 2 tablets (650 mg total) by mouth every 6 (six) hours as needed (or Fever >/= 101). 08/15/12  Yes Black, Lezlie Octave, NP  Cholecalciferol (VITAMIN D3) 1000 units CAPS Take 2 capsules (2,000 Units total) by mouth daily. 10/13/15  Yes Scarlette Calico  L, MD  feeding supplement, ENSURE ENLIVE, (ENSURE ENLIVE) LIQD Take 237 mLs by mouth 2 (two) times daily between meals.   Yes [provider]  ferrous sulfate 325 (65 FE) MG tablet take 1 tablet by mouth twice a day with meals 11/12/16  Yes Janith Lima, MD  hydrALAZINE (APRESOLINE) 25 MG tablet Take 1 tablet (25 mg total) by mouth 2 (two) times daily. 05/28/17  Yes Debbe Odea, MD  Multiple Vitamins-Minerals (CERTAVITE SENIOR/ANTIOXIDANT) TABS Take 1 tablet by mouth every morning.    Yes [provider]  simvastatin (ZOCOR) 20 MG tablet Take 1  tablet (20 mg total) by mouth at bedtime. 04/02/17  Yes Janith Lima, MD  torsemide (DEMADEX) 10 MG tablet Take 1 tablet (10 mg total) by mouth daily. 07/05/17  Yes Janith Lima, MD    Family History Family History  Problem Relation Age of Onset  . Heart disease Mother   . Heart disease Father   . Alzheimer's disease Sister   . Alzheimer's disease Sister   . Heart attack Sister   . Heart attack Sister     Social History Social History   Tobacco Use  . Smoking status: Former Smoker    Years: 40.00    Types: Cigarettes    Last attempt to quit: 08/08/1987    Years since quitting: 29.9  . Smokeless tobacco: Never Used  Substance Use Topics  . Alcohol use: No  . Drug use: No     Allergies   Metformin and related; Nitrofurantoin; Tramadol; and Codeine   Review of Systems Review of Systems  Constitutional: Negative for fever.  HENT: Negative for sore throat.   Eyes: Negative for visual disturbance.  Respiratory: Negative for cough and shortness of breath.   Cardiovascular: Negative for chest pain.  Gastrointestinal: Negative for abdominal pain, nausea and vomiting.  Genitourinary: Negative for difficulty urinating.  Musculoskeletal: Negative for back pain and neck pain.  Skin: Negative for rash.  Neurological: Negative for syncope, weakness, light-headedness and headaches.     Physical Exam Updated Vital Signs BP (!) 162/72   Pulse 63   Temp 97.8 F (36.6 C) (Oral)   Resp 18   SpO2 97%   Physical Exam  Constitutional: She appears well-developed and well-nourished. No distress.  HENT:  Head: Normocephalic and atraumatic.  Eyes: Conjunctivae and EOM are normal.  Neck: Normal range of motion.  Cardiovascular: Normal rate, regular rhythm and intact distal pulses. Frequent extrasystoles are present. Exam reveals no gallop and no friction rub.  Murmur heard. Pulmonary/Chest: Effort normal and breath sounds normal. No respiratory distress. She has no wheezes.  She has no rales.  Abdominal: Soft. She exhibits no distension. There is no tenderness. There is no guarding.  Musculoskeletal: She exhibits no edema or tenderness.  Neurological: She is alert.  Skin: Skin is warm and dry. No rash noted. She is not diaphoretic. No erythema.  Nursing note and vitals reviewed.    ED Treatments / Results  Labs (all labs ordered are listed, but only abnormal results are displayed) Labs Reviewed  COMPREHENSIVE METABOLIC PANEL - Abnormal; Notable for the following components:      Result Value   Glucose, Bld 125 (*)    BUN 28 (*)    Creatinine, Ser 1.35 (*)    AST 43 (*)    ALT 8 (*)    GFR calc non Af Amer 32 (*)    GFR calc Af Amer 37 (*)    All  other components within normal limits  CBC  I-STAT TROPONIN, ED    EKG  EKG Interpretation  Date/Time:  Thursday July 12 2017 18:01:38 EST Ventricular Rate:  84 PR Interval:  194 QRS Duration: 152 QT Interval:  454 QTC Calculation: 536 R Axis:   105 Text Interpretation:  Sinus rhythm with frequent Premature ventricular complexes Right bundle branch block T wave abnormality, consider inferior ischemia Abnormal ECG PVCs new from prior Confirmed by Gareth Morgan [phone]) on 07/12/2017 9:01:24 PM       Radiology Dg Chest 2 View  Result Date: 07/12/2017 CLINICAL DATA:  Bradycardia today. Chest pain, shortness of breath and dizziness. Former smoker. EXAM: CHEST  2 VIEW COMPARISON:  Chest radiograph May 28, 2017 FINDINGS: Cardiac silhouette is normal size. Calcified aortic knob. Mild chronic interstitial changes without pleural effusion or focal consolidation. Bandlike density lingula. No pneumothorax. Osteopenia. Small focus LEFT chest wall gas likely reflecting skin fold. IMPRESSION: Mild chronic interstitial changes with lingular atelectasis or scarring. Aortic Atherosclerosis (ICD10-I70.0). Electronically Signed   By: Elon Alas M.D.   On: 07/12/2017 19:04    Procedures Procedures  (including critical care time)  Medications Ordered in ED Medications - No data to display   Initial Impression / Assessment and Plan / ED Course  I have reviewed the triage vital signs and the nursing notes.  Pertinent labs & imaging results that were available during my care of the patient were reviewed by me and considered in my medical decision making (see chart for details).      83 year old female with history of coronary artery disease, hypertension, hyperlipidemia, diabetes, dementia presents with concern for low heart rate measured by home health.  Patient noted to have bradycardia of 30s and 40s seen on pulse oximetry, however on cardiac telemetry, noted to have multiple PVCs and a heart rate in the 70s and 80s.  It appears that the pulse oximeter is not picking up her PVCs.  Patient remains asymptomatic, with normal blood pressures.  Discussed case with Dr. Percival Spanish of cardiology, given patient's frequent PVCs and pulse discrepancy, as well as her episode of syncope 1 month ago, and he also feels that the PVCs do not represent emergent pathology and that the bradycardia on pulse oximetry represents non-transmittance of PVCs. Discussed this with patient and family. Lab work obtained is without significant findings. She remains asymptomatic and stable during period of observation in the ED.  Recommend Cardiology follow up as well as PCP follow up. Patient discharged in stable condition with understanding of reasons to return.   Final Clinical Impressions(s) / ED Diagnoses   Final diagnoses:  PVC's (premature ventricular contractions)    ED Discharge Orders    None       Gareth Morgan, MD 07/13/17 0025

## 2017-07-12 NOTE — ED Triage Notes (Signed)
Pt presents to ED after having two days of low HRs on the monitor with home health (40's).  EKG at triage reads 88, irregular, but finger monitor picking up 44.  Pt denies any associated symptoms, including SOB, CP, light-headedness/dizziness, weakness.

## 2017-07-12 NOTE — Telephone Encounter (Signed)
Patient is A&O per physical therapy assistant.  Patient has a HR of 37. PER PTA patient had low HR on yesterday and Nurse notified office. Patient denies any chest pain, shortness of breath, difficulty breathing or sweating.  Advised since this is day two of low HR, I agree with disposition of ED.  Son Sonia Side agrees to take patient for evaluation Reason for Disposition . Heart beating very slowly (e.g., < 50 / minute)  (Exception: athlete)  Answer Assessment - Initial Assessment Questions 1. DESCRIPTION: "Please describe your heart rate or heart beat that you are having" (e.g., fast/slow, regular/irregular, skipped or extra beats, "palpitations")     37 beats per minute per PTA 2. ONSET: "When did it start?" (Minutes, hours or days)      Nurse was out on 07/11/17 and she had a low HR 3. DURATION: "How long does it last" (e.g., seconds, minutes, hours)    2 days 4. PATTERN "Does it come and go, or has it been constant since it started?"  "Does it get worse with exertion?"   "Are you feeling it now?"     unsure 5. TAP: "Using your hand, can you tap out what you are feeling on a chair or table in front of you, so that I can hear?" (Note: not all patients can do this)       *No Answer* 6. HEART RATE: "Can you tell me your heart rate?" "How many beats in 15 seconds?"  (Note: not all patients can do this)       *No Answer* 7. RECURRENT SYMPTOM: "Have you ever had this before?" If so, ask: "When was the last time?" and "What happened that time?"      now 8. CAUSE: "What do you think is causing the palpitations?"     *No Answer* 9. CARDIAC HISTORY: "Do you have any history of heart disease?" (e.g., heart attack, angina, bypass surgery, angioplasty, arrhythmia)      No 10. OTHER SYMPTOMS: "Do you have any other symptoms?" (e.g., dizziness, chest pain, sweating, difficulty breathing)       No symptoms  Protocols used: HEART RATE AND HEARTBEAT QUESTIONS-A-AH

## 2017-07-13 NOTE — Telephone Encounter (Signed)
Audrey Reed and she informed - Pt was taken to ED yesterday per triage advise.

## 2017-07-19 ENCOUNTER — Telehealth: Payer: Self-pay | Admitting: Internal Medicine

## 2017-07-19 NOTE — Telephone Encounter (Signed)
Copied from [place]. Topic: Inquiry >> Jul 19, 2017  4:38 PM Malena Catholic I, [place] wrote: Reason for CRM: Home care Nurse cancel For today 07/19/2017 Please call tracy For information [phone]

## 2017-07-20 DIAGNOSIS — F0390 Unspecified dementia without behavioral disturbance: Secondary | ICD-10-CM | POA: Diagnosis not present

## 2017-07-20 DIAGNOSIS — I129 Hypertensive chronic kidney disease with stage 1 through stage 4 chronic kidney disease, or unspecified chronic kidney disease: Secondary | ICD-10-CM | POA: Diagnosis not present

## 2017-07-20 DIAGNOSIS — I350 Nonrheumatic aortic (valve) stenosis: Secondary | ICD-10-CM | POA: Diagnosis not present

## 2017-07-20 DIAGNOSIS — I272 Pulmonary hypertension, unspecified: Secondary | ICD-10-CM | POA: Diagnosis not present

## 2017-07-20 DIAGNOSIS — R2689 Other abnormalities of gait and mobility: Secondary | ICD-10-CM | POA: Diagnosis not present

## 2017-07-20 DIAGNOSIS — R260 Ataxic gait: Secondary | ICD-10-CM | POA: Diagnosis not present

## 2017-07-20 DIAGNOSIS — E1122 Type 2 diabetes mellitus with diabetic chronic kidney disease: Secondary | ICD-10-CM | POA: Diagnosis not present

## 2017-07-20 DIAGNOSIS — M6281 Muscle weakness (generalized): Secondary | ICD-10-CM | POA: Diagnosis not present

## 2017-07-20 DIAGNOSIS — I2510 Atherosclerotic heart disease of native coronary artery without angina pectoris: Secondary | ICD-10-CM | POA: Diagnosis not present

## 2017-07-20 DIAGNOSIS — N183 Chronic kidney disease, stage 3 (moderate): Secondary | ICD-10-CM | POA: Diagnosis not present

## 2017-07-20 NOTE — Telephone Encounter (Signed)
Copied from [place]. Topic: General - Other >> Jul 20, 2017  9:48 AM Synthia Innocent wrote: Reason for CRM: FYI [facility] needs to move visit to next week

## 2017-07-20 NOTE — Telephone Encounter (Signed)
[facility] informed okay to move visit to next week.

## 2017-07-23 ENCOUNTER — Ambulatory Visit: Payer: Medicare Other | Admitting: Podiatry

## 2017-07-26 DIAGNOSIS — I129 Hypertensive chronic kidney disease with stage 1 through stage 4 chronic kidney disease, or unspecified chronic kidney disease: Secondary | ICD-10-CM | POA: Diagnosis not present

## 2017-07-26 DIAGNOSIS — E1122 Type 2 diabetes mellitus with diabetic chronic kidney disease: Secondary | ICD-10-CM | POA: Diagnosis not present

## 2017-07-26 DIAGNOSIS — I272 Pulmonary hypertension, unspecified: Secondary | ICD-10-CM | POA: Diagnosis not present

## 2017-07-26 DIAGNOSIS — I350 Nonrheumatic aortic (valve) stenosis: Secondary | ICD-10-CM | POA: Diagnosis not present

## 2017-07-26 DIAGNOSIS — N183 Chronic kidney disease, stage 3 (moderate): Secondary | ICD-10-CM | POA: Diagnosis not present

## 2017-07-26 DIAGNOSIS — I2510 Atherosclerotic heart disease of native coronary artery without angina pectoris: Secondary | ICD-10-CM | POA: Diagnosis not present

## 2017-07-27 ENCOUNTER — Telehealth: Payer: Self-pay | Admitting: Internal Medicine

## 2017-07-27 DIAGNOSIS — I350 Nonrheumatic aortic (valve) stenosis: Secondary | ICD-10-CM | POA: Diagnosis not present

## 2017-07-27 DIAGNOSIS — N183 Chronic kidney disease, stage 3 (moderate): Secondary | ICD-10-CM | POA: Diagnosis not present

## 2017-07-27 DIAGNOSIS — I2510 Atherosclerotic heart disease of native coronary artery without angina pectoris: Secondary | ICD-10-CM | POA: Diagnosis not present

## 2017-07-27 DIAGNOSIS — E1122 Type 2 diabetes mellitus with diabetic chronic kidney disease: Secondary | ICD-10-CM | POA: Diagnosis not present

## 2017-07-27 DIAGNOSIS — I272 Pulmonary hypertension, unspecified: Secondary | ICD-10-CM | POA: Diagnosis not present

## 2017-07-27 DIAGNOSIS — I129 Hypertensive chronic kidney disease with stage 1 through stage 4 chronic kidney disease, or unspecified chronic kidney disease: Secondary | ICD-10-CM | POA: Diagnosis not present

## 2017-07-27 NOTE — Telephone Encounter (Signed)
Copied from [place] [id number]. Topic: Inquiry >> Jul 27, 2017  2:45 PM Audrey Reed I, NT wrote: Reason for JQG:BEEF Tamala Julian physical therapy call  and said the family do not want her to being see these week more inf call Flonnie Hailstone @[phone]

## 2017-07-27 NOTE — Telephone Encounter (Signed)
Copied from [place] [id number]. Topic: Inquiry >> Jul 27, 2017  2:45 PM Malena Catholic I, NT wrote: Reason for GXQ:JJHE Tamala Julian physical therapy call  and said the family do not want her to have PT  these week more inf call Flonnie Hailstone @[phone]

## 2017-08-16 NOTE — Telephone Encounter (Signed)
Remo Lipps with [facility] called in to receive verbal orders to change discharge date to next Friday 08/24/17 instead.  She said that the pt's son is requesting it.    CB: [phone]

## 2017-08-16 NOTE — Telephone Encounter (Signed)
Audrey Reed with [facility] and gave verbal okay for discharge date changed.   Forwarded to PCP for [facility]

## 2017-08-20 ENCOUNTER — Encounter (INDEPENDENT_AMBULATORY_CARE_PROVIDER_SITE_OTHER): Payer: Medicare Other | Admitting: Podiatry

## 2017-08-20 ENCOUNTER — Encounter: Payer: Self-pay | Admitting: Podiatry

## 2017-08-20 ENCOUNTER — Ambulatory Visit: Payer: Medicare Other | Admitting: Podiatry

## 2017-08-20 DIAGNOSIS — B351 Tinea unguium: Secondary | ICD-10-CM | POA: Diagnosis not present

## 2017-08-20 DIAGNOSIS — M79674 Pain in right toe(s): Secondary | ICD-10-CM

## 2017-08-20 DIAGNOSIS — M79675 Pain in left toe(s): Secondary | ICD-10-CM

## 2017-08-24 ENCOUNTER — Telehealth: Payer: Self-pay | Admitting: Internal Medicine

## 2017-08-24 NOTE — Telephone Encounter (Signed)
Copied from [place] [id number]. Topic: General - Other >> Aug 24, 2017  2:32 PM Bea Graff, NT wrote: Reason for CRM: Remo Lipps from [facility] called and stated she tried to get in touch with pt to do her OT and nursing for several weeks, unable again today to see pt and it is the end of her Medicare period. Will not be able to do discharge visit. CB#: [phone]

## 2017-10-03 ENCOUNTER — Ambulatory Visit: Payer: Medicare Other | Admitting: Internal Medicine

## 2017-11-19 ENCOUNTER — Other Ambulatory Visit: Payer: Medicare Other | Admitting: Podiatry

## 2017-11-20 ENCOUNTER — Ambulatory Visit: Payer: Medicare Other | Admitting: Internal Medicine

## 2017-11-20 DIAGNOSIS — Z0289 Encounter for other administrative examinations: Secondary | ICD-10-CM

## 2018-01-19 DIAGNOSIS — I469 Cardiac arrest, cause unspecified: Secondary | ICD-10-CM | POA: Diagnosis not present

## 2018-01-19 DIAGNOSIS — R402441 Other coma, without documented Glasgow coma scale score, or with partial score reported, in the field [EMT or ambulance]: Secondary | ICD-10-CM | POA: Diagnosis not present

## 2018-01-22 ENCOUNTER — Telehealth: Payer: Self-pay

## 2018-01-22 NOTE — Telephone Encounter (Signed)
On 01/22/18 I received a d/c from [facility] (original [place]).  The d/c is for burial.   The patient is a patient of Doctor Scarlette Calico.  The d/c will be taken to [facility] for signature.   On 01/22/18 I received the d/c back from Doctor Scarlette Calico. I got the d/c ready and called the funeral home to let them know the d/c is ready for pickup.

## 2018-02-04 DIAGNOSIS — 419620001 Death: Secondary | SNOMED CT | POA: Diagnosis not present

## 2018-02-04 DEATH — deceased

## 2018-04-15 NOTE — Progress Notes (Signed)
This encounter was created in error - please disregard.

## 2019-10-02 IMAGING — DX DG CHEST 1V PORT
1 series · 1 of 1 positions shown · non-contrast
Comparison: 05/24/2017.

CLINICAL DATA: Intubation.

EXAM:
PORTABLE CHEST 1 VIEW

[chest ap]
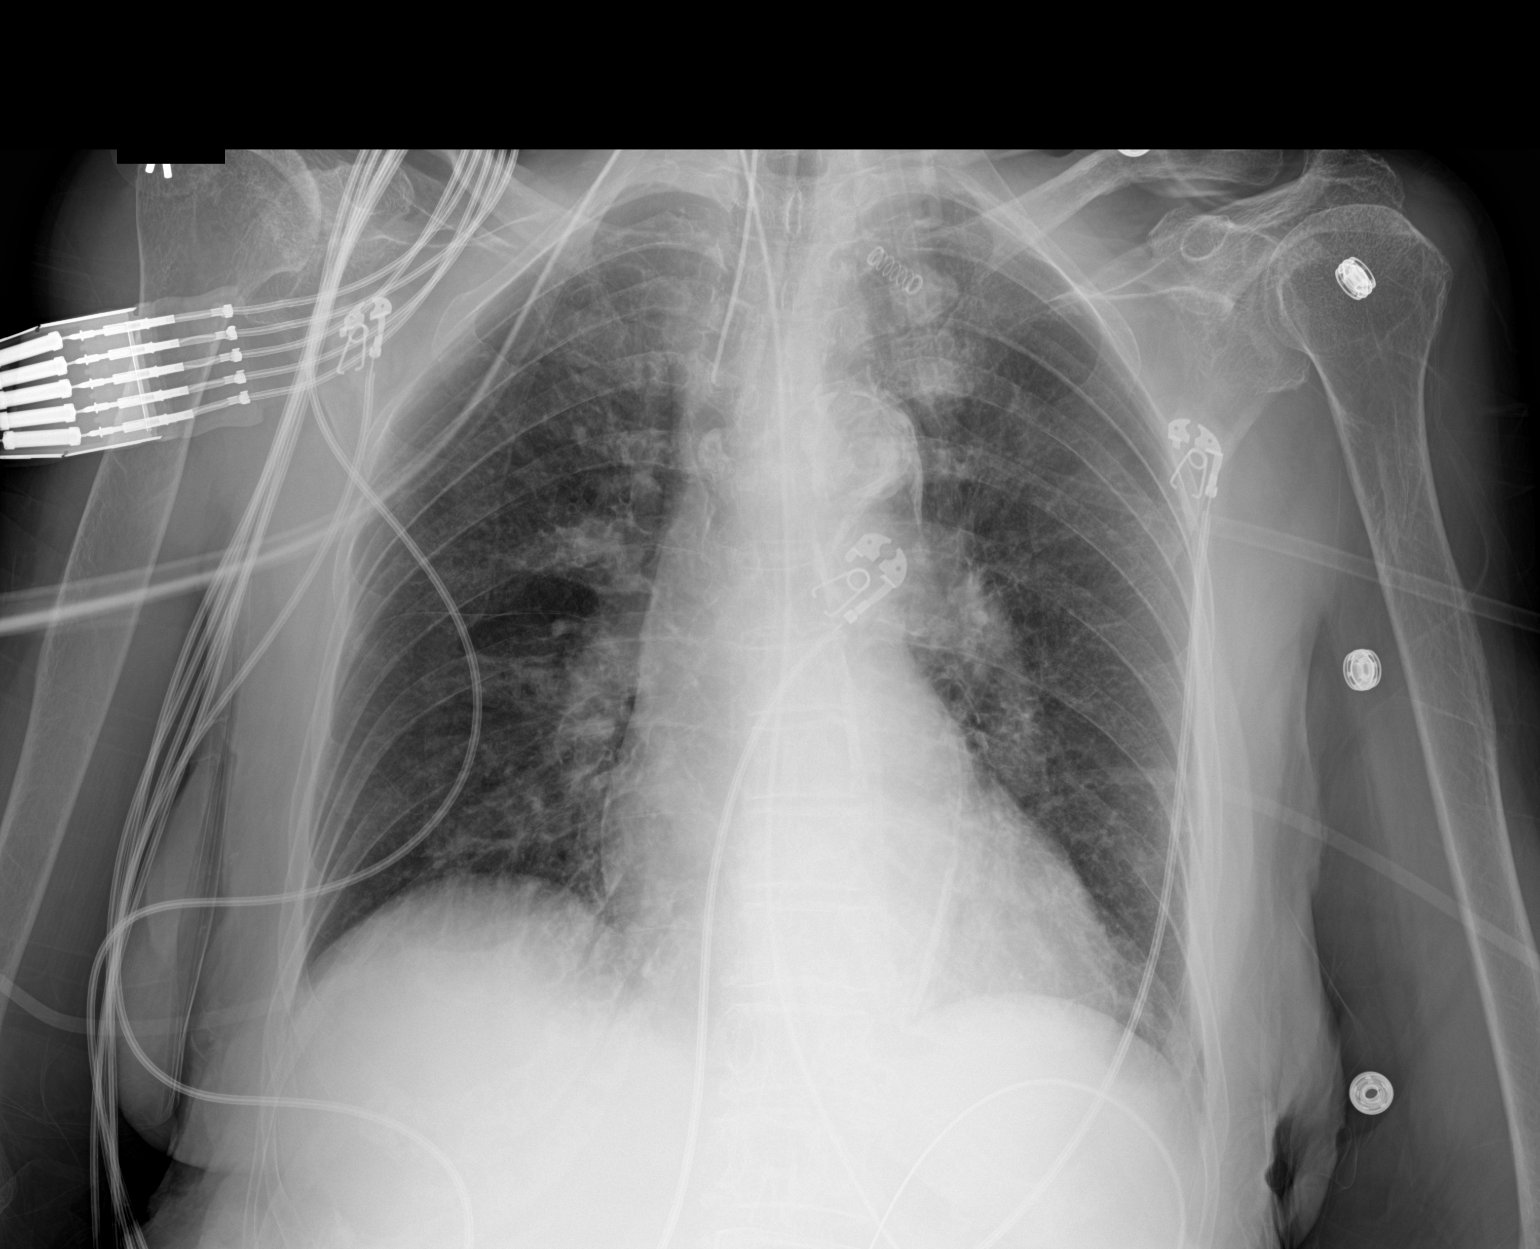

[1 of 1 positions shown; findings below may reference images not displayed]

FINDINGS: Endotracheal tube and NG tube in stable position. Heart size normal.
Low lung volumes . No focal infiltrate. No prominent pleural
effusion. No pneumothorax.
IMPRESSION: 1. Endotracheal tube and NG tube in stable position.

2. Low lung volumes.

## 2019-10-05 IMAGING — DX DG CHEST 1V PORT
1 series · 1 of 1 positions shown · non-contrast
Comparison: 05/25/2017

CLINICAL DATA: Cough.

EXAM:
PORTABLE CHEST 1 VIEW

[chest ap]
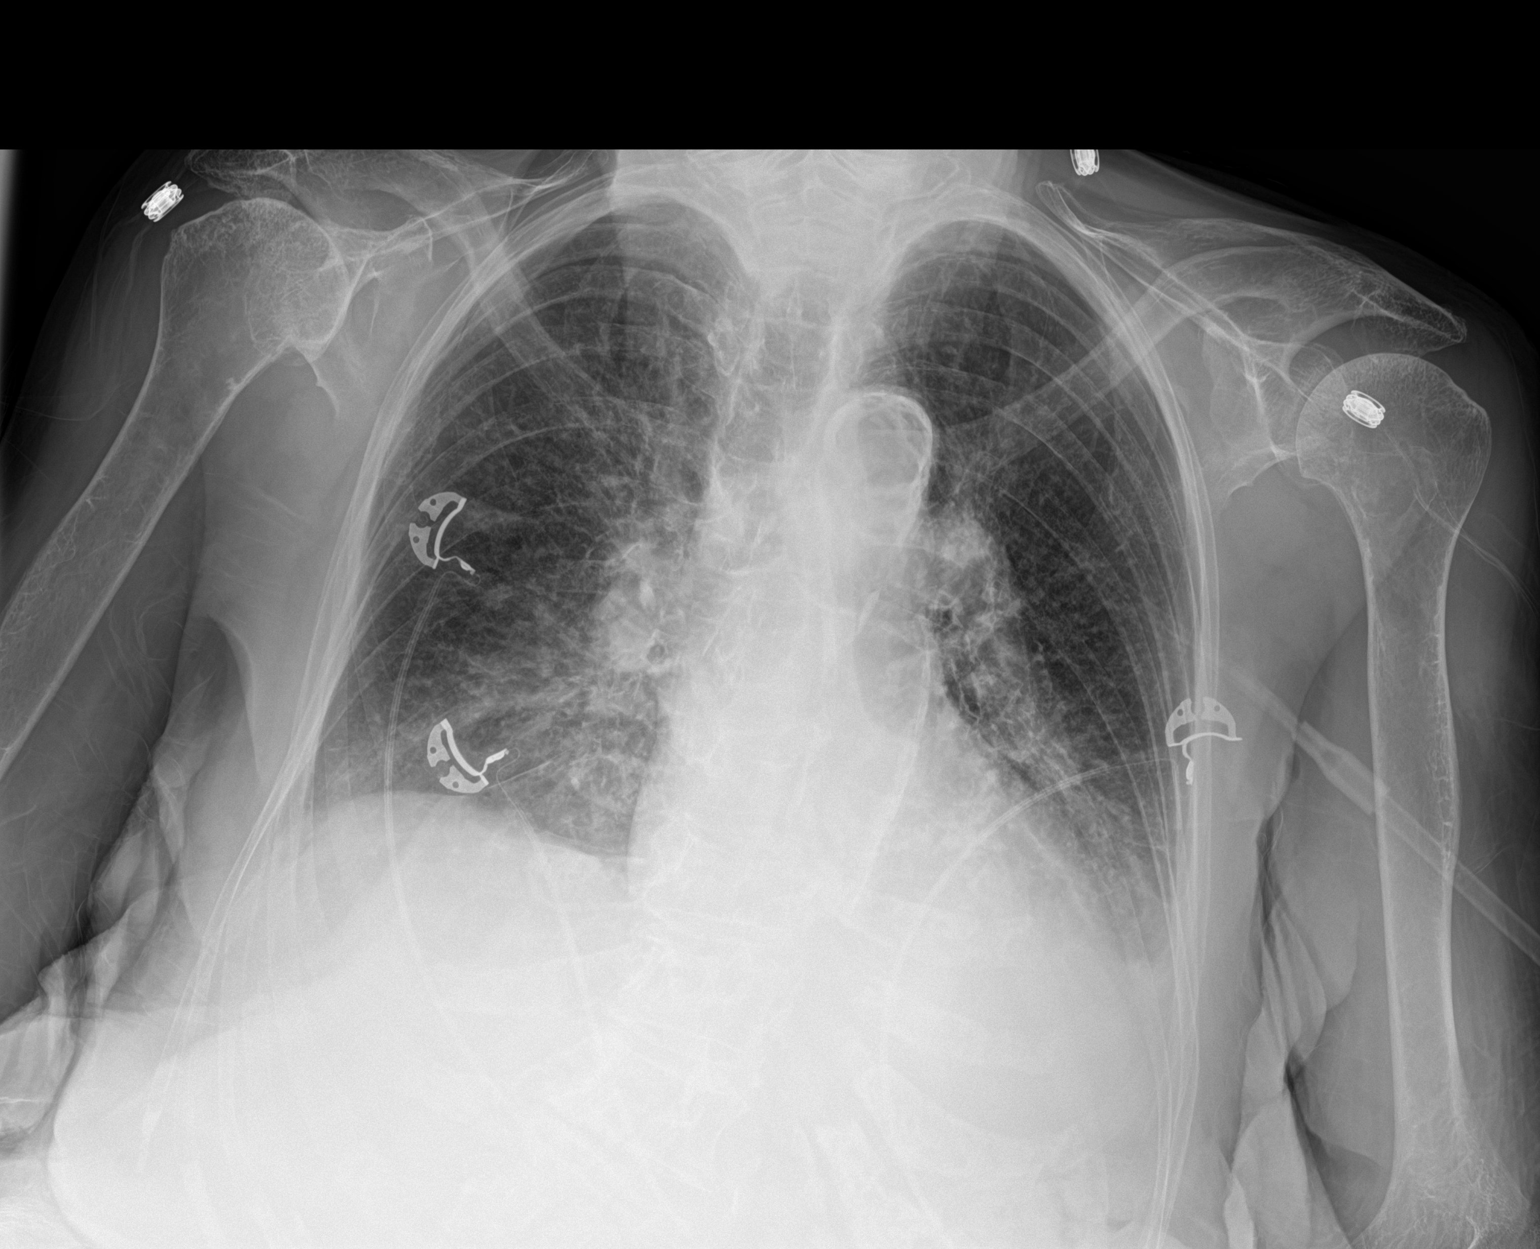

[1 of 1 positions shown; findings below may reference images not displayed]

FINDINGS: Endotracheal and enteric tubes have been removed. The
cardiomediastinal silhouette is unchanged. Extensive aortic
atherosclerosis is noted. Lung volumes are diminished compared to
the prior study with patchy left basilar airspace opacity and
possible small pleural effusion. Milder right basilar opacity is
also present. No pneumothorax is seen.
IMPRESSION: Interval extubation. Low lung volumes with left greater than right
basilar opacities which may reflect atelectasis or pneumonia.
Possible small left pleural effusion.
